# Patient Record
Sex: Female | Born: 1950
Health system: Southern US, Community
[De-identification: ages and names within clinical notes are randomized; demographics above are authoritative.]

## PROBLEM LIST (undated history)

## (undated) DIAGNOSIS — S301XXA Contusion of abdominal wall, initial encounter: Secondary | ICD-10-CM

## (undated) DIAGNOSIS — I509 Heart failure, unspecified: Secondary | ICD-10-CM

## (undated) DIAGNOSIS — I4901 Ventricular fibrillation: Secondary | ICD-10-CM

## (undated) DIAGNOSIS — I480 Paroxysmal atrial fibrillation: Secondary | ICD-10-CM

## (undated) DIAGNOSIS — J189 Pneumonia, unspecified organism: Secondary | ICD-10-CM

## (undated) DIAGNOSIS — I313 Pericardial effusion (noninflammatory): Secondary | ICD-10-CM

## (undated) DIAGNOSIS — N201 Calculus of ureter: Secondary | ICD-10-CM

## (undated) DIAGNOSIS — N200 Calculus of kidney: Secondary | ICD-10-CM

## (undated) DIAGNOSIS — I214 Non-ST elevation (NSTEMI) myocardial infarction: Secondary | ICD-10-CM

## (undated) DIAGNOSIS — J9809 Other diseases of bronchus, not elsewhere classified: Secondary | ICD-10-CM

## (undated) DIAGNOSIS — T17500A Unspecified foreign body in bronchus causing asphyxiation, initial encounter: Secondary | ICD-10-CM

## (undated) DIAGNOSIS — I3139 Other pericardial effusion (noninflammatory): Secondary | ICD-10-CM

## (undated) DIAGNOSIS — I10 Essential (primary) hypertension: Secondary | ICD-10-CM

## (undated) DIAGNOSIS — I739 Peripheral vascular disease, unspecified: Secondary | ICD-10-CM

## (undated) DIAGNOSIS — E7800 Pure hypercholesterolemia, unspecified: Secondary | ICD-10-CM

## (undated) DIAGNOSIS — Z87442 Personal history of urinary calculi: Secondary | ICD-10-CM

## (undated) DIAGNOSIS — S3012XA Contusion of groin, initial encounter: Secondary | ICD-10-CM

## (undated) DIAGNOSIS — I714 Abdominal aortic aneurysm, without rupture, unspecified: Secondary | ICD-10-CM

## (undated) DIAGNOSIS — I4891 Unspecified atrial fibrillation: Secondary | ICD-10-CM

## (undated) DIAGNOSIS — I5189 Other ill-defined heart diseases: Secondary | ICD-10-CM

## (undated) DIAGNOSIS — I2510 Atherosclerotic heart disease of native coronary artery without angina pectoris: Secondary | ICD-10-CM

## (undated) DIAGNOSIS — E109 Type 1 diabetes mellitus without complications: Secondary | ICD-10-CM

## (undated) HISTORY — PX: CORONARY ANGIOPLASTY WITH STENT PLACEMENT: SHX49

## (undated) HISTORY — DX: Other pericardial effusion (noninflammatory): I31.39

## (undated) HISTORY — DX: Paroxysmal atrial fibrillation: I48.0

## (undated) HISTORY — DX: Calculus of kidney: N20.0

## (undated) HISTORY — DX: Other diseases of bronchus, not elsewhere classified: J98.09

## (undated) HISTORY — DX: Calculus of ureter: N20.1

## (undated) HISTORY — PX: RECONSTRUCTION OF EYELID: SHX6576

## (undated) HISTORY — DX: Pericardial effusion (noninflammatory): I31.3

## (undated) HISTORY — PX: COLONOSCOPY: SHX174

## (undated) HISTORY — PX: TONSILLECTOMY: SUR1361

## (undated) HISTORY — DX: Unspecified atrial fibrillation: I48.91

## (undated) HISTORY — DX: Unspecified foreign body in bronchus causing asphyxiation, initial encounter: T17.500A

## (undated) HISTORY — PX: NASAL SINUS SURGERY: SHX719

---

## 2004-12-20 ENCOUNTER — Ambulatory Visit: Payer: Self-pay | Admitting: Unknown Physician Specialty

## 2006-12-18 ENCOUNTER — Ambulatory Visit: Payer: Self-pay | Admitting: General Surgery

## 2009-11-03 ENCOUNTER — Ambulatory Visit: Payer: Self-pay | Admitting: Endocrinology

## 2011-01-03 ENCOUNTER — Ambulatory Visit: Payer: Self-pay | Admitting: Obstetrics and Gynecology

## 2011-01-17 HISTORY — PX: COLPOSCOPY: SHX161

## 2012-03-05 HISTORY — PX: CERVICAL BIOPSY  W/ LOOP ELECTRODE EXCISION: SUR135

## 2013-02-01 ENCOUNTER — Ambulatory Visit: Payer: Self-pay | Admitting: Gynecologic Oncology

## 2013-02-26 ENCOUNTER — Ambulatory Visit: Payer: Self-pay | Admitting: Gynecologic Oncology

## 2013-03-03 ENCOUNTER — Ambulatory Visit: Payer: Self-pay | Admitting: Gynecologic Oncology

## 2014-12-03 ENCOUNTER — Ambulatory Visit: Payer: Self-pay | Admitting: Family

## 2014-12-03 ENCOUNTER — Encounter: Payer: Self-pay | Admitting: Physician Assistant

## 2014-12-03 VITALS — BP 130/80 | HR 71 | Temp 98.5°F

## 2014-12-03 DIAGNOSIS — J019 Acute sinusitis, unspecified: Secondary | ICD-10-CM

## 2014-12-03 MED ORDER — AMOXICILLIN 875 MG PO TABS: 875.0000 mg | ORAL_TABLET | Freq: Two times a day (BID) | ORAL | Status: DC

## 2014-12-03 NOTE — Progress Notes (Signed)
S/ nasal congestion , sinus pressure and pain, low grade temp, malaise,  getting worse  O/VSS mildly ill appearing NAD ENT R EAC with wax, left tm dull, nasal mucosa very inflamed , with purulent d/c turbinates boggy , + frontomax tenderness phaynx clear Neck supple heart rsr lungs clear  A/ rhinosinusitis P/ amoxicillan 875 bid , supportive measures .

## 2015-05-29 ENCOUNTER — Ambulatory Visit: Payer: Self-pay | Admitting: Physician Assistant

## 2015-05-29 ENCOUNTER — Encounter: Payer: Self-pay | Admitting: Physician Assistant

## 2015-05-29 VITALS — BP 125/90 | HR 92 | Temp 98.5°F

## 2015-05-29 DIAGNOSIS — J209 Acute bronchitis, unspecified: Secondary | ICD-10-CM

## 2015-05-29 MED ORDER — PSEUDOEPH-BROMPHEN-DM 30-2-10 MG/5ML PO SYRP: 5.0000 mL | ORAL_SOLUTION | Freq: Four times a day (QID) | ORAL | Status: DC | PRN

## 2015-05-29 MED ORDER — SULFAMETHOXAZOLE-TRIMETHOPRIM 800-160 MG PO TABS: 1.0000 | ORAL_TABLET | Freq: Two times a day (BID) | ORAL | Status: DC

## 2015-05-29 NOTE — Progress Notes (Signed)
   Subjective:Cough    Patient ID: Lisa [name], female    DOB: 03/21/1950, 65 y.o.   MRN: [id number]  HPI Patient states one week of productive greenish cough. Denies fever/chill, or N/V/D. No palliative measure for compliant.   Review of Systems    Negative except for compliant. Objective:   Physical Exam No acute distress.  VSS. HEENT unremarkable. Neck supple, Lungs with upper bialteral Rales. Heart RRR.       Assessment & Plan:Bronchitis.  Bactrim DS and Bromfed DM.  Follow up 3 days if no improvement.

## 2016-02-01 DIAGNOSIS — J189 Pneumonia, unspecified organism: Secondary | ICD-10-CM

## 2016-02-01 HISTORY — DX: Pneumonia, unspecified organism: J18.9

## 2016-10-01 HISTORY — PX: CARDIAC CATHETERIZATION: SHX172

## 2016-10-11 ENCOUNTER — Encounter
Admission: EM | Disposition: A | Discharge status: Other Acute Inpatient Hospital | Payer: Self-pay | Source: Home / Self Care | Attending: Internal Medicine

## 2016-10-11 ENCOUNTER — Inpatient Hospital Stay: Payer: Managed Care, Other (non HMO)

## 2016-10-11 ENCOUNTER — Inpatient Hospital Stay (HOSPITAL_COMMUNITY): Payer: Managed Care, Other (non HMO)

## 2016-10-11 ENCOUNTER — Inpatient Hospital Stay
Admission: EM | Admit: 2016-10-11 | Discharge: 2016-10-13 | DRG: 689 | Disposition: A | Discharge status: Other Acute Inpatient Hospital | Payer: Managed Care, Other (non HMO) | Attending: Internal Medicine | Admitting: Internal Medicine

## 2016-10-11 ENCOUNTER — Emergency Department: Payer: Managed Care, Other (non HMO)

## 2016-10-11 DIAGNOSIS — Z7982 Long term (current) use of aspirin: Secondary | ICD-10-CM

## 2016-10-11 DIAGNOSIS — R1032 Left lower quadrant pain: Secondary | ICD-10-CM

## 2016-10-11 DIAGNOSIS — J9600 Acute respiratory failure, unspecified whether with hypoxia or hypercapnia: Secondary | ICD-10-CM

## 2016-10-11 DIAGNOSIS — T782XXS Anaphylactic shock, unspecified, sequela: Secondary | ICD-10-CM | POA: Diagnosis not present

## 2016-10-11 DIAGNOSIS — E1051 Type 1 diabetes mellitus with diabetic peripheral angiopathy without gangrene: Secondary | ICD-10-CM | POA: Diagnosis present

## 2016-10-11 DIAGNOSIS — K7200 Acute and subacute hepatic failure without coma: Secondary | ICD-10-CM | POA: Diagnosis not present

## 2016-10-11 DIAGNOSIS — Z792 Long term (current) use of antibiotics: Secondary | ICD-10-CM | POA: Diagnosis not present

## 2016-10-11 DIAGNOSIS — Z87891 Personal history of nicotine dependence: Secondary | ICD-10-CM

## 2016-10-11 DIAGNOSIS — E108 Type 1 diabetes mellitus with unspecified complications: Secondary | ICD-10-CM | POA: Diagnosis not present

## 2016-10-11 DIAGNOSIS — I469 Cardiac arrest, cause unspecified: Secondary | ICD-10-CM

## 2016-10-11 DIAGNOSIS — I4901 Ventricular fibrillation: Secondary | ICD-10-CM

## 2016-10-11 DIAGNOSIS — G931 Anoxic brain damage, not elsewhere classified: Secondary | ICD-10-CM | POA: Diagnosis not present

## 2016-10-11 DIAGNOSIS — I442 Atrioventricular block, complete: Secondary | ICD-10-CM | POA: Diagnosis not present

## 2016-10-11 DIAGNOSIS — E871 Hypo-osmolality and hyponatremia: Secondary | ICD-10-CM | POA: Diagnosis not present

## 2016-10-11 DIAGNOSIS — Z87442 Personal history of urinary calculi: Secondary | ICD-10-CM

## 2016-10-11 DIAGNOSIS — J9690 Respiratory failure, unspecified, unspecified whether with hypoxia or hypercapnia: Secondary | ICD-10-CM

## 2016-10-11 DIAGNOSIS — N201 Calculus of ureter: Secondary | ICD-10-CM | POA: Diagnosis not present

## 2016-10-11 DIAGNOSIS — R110 Nausea: Secondary | ICD-10-CM

## 2016-10-11 DIAGNOSIS — I77811 Abdominal aortic ectasia: Secondary | ICD-10-CM | POA: Diagnosis present

## 2016-10-11 DIAGNOSIS — T886XXA Anaphylactic reaction due to adverse effect of correct drug or medicament properly administered, initial encounter: Secondary | ICD-10-CM | POA: Diagnosis not present

## 2016-10-11 DIAGNOSIS — N179 Acute kidney failure, unspecified: Secondary | ICD-10-CM | POA: Diagnosis not present

## 2016-10-11 DIAGNOSIS — I248 Other forms of acute ischemic heart disease: Secondary | ICD-10-CM

## 2016-10-11 DIAGNOSIS — E8770 Fluid overload, unspecified: Secondary | ICD-10-CM | POA: Diagnosis not present

## 2016-10-11 DIAGNOSIS — I214 Non-ST elevation (NSTEMI) myocardial infarction: Secondary | ICD-10-CM | POA: Diagnosis not present

## 2016-10-11 DIAGNOSIS — T361X5A Adverse effect of cephalosporins and other beta-lactam antibiotics, initial encounter: Secondary | ICD-10-CM | POA: Diagnosis present

## 2016-10-11 DIAGNOSIS — J9601 Acute respiratory failure with hypoxia: Secondary | ICD-10-CM | POA: Diagnosis not present

## 2016-10-11 DIAGNOSIS — T782XXA Anaphylactic shock, unspecified, initial encounter: Secondary | ICD-10-CM

## 2016-10-11 DIAGNOSIS — N132 Hydronephrosis with renal and ureteral calculous obstruction: Secondary | ICD-10-CM | POA: Diagnosis not present

## 2016-10-11 DIAGNOSIS — Z79899 Other long term (current) drug therapy: Secondary | ICD-10-CM | POA: Diagnosis not present

## 2016-10-11 DIAGNOSIS — D72829 Elevated white blood cell count, unspecified: Secondary | ICD-10-CM | POA: Diagnosis present

## 2016-10-11 DIAGNOSIS — D649 Anemia, unspecified: Secondary | ICD-10-CM | POA: Diagnosis not present

## 2016-10-11 DIAGNOSIS — N3001 Acute cystitis with hematuria: Secondary | ICD-10-CM

## 2016-10-11 DIAGNOSIS — I2510 Atherosclerotic heart disease of native coronary artery without angina pectoris: Secondary | ICD-10-CM | POA: Diagnosis present

## 2016-10-11 DIAGNOSIS — I739 Peripheral vascular disease, unspecified: Secondary | ICD-10-CM | POA: Diagnosis not present

## 2016-10-11 DIAGNOSIS — N200 Calculus of kidney: Secondary | ICD-10-CM

## 2016-10-11 DIAGNOSIS — Z9911 Dependence on respirator [ventilator] status: Secondary | ICD-10-CM | POA: Diagnosis not present

## 2016-10-11 DIAGNOSIS — R570 Cardiogenic shock: Secondary | ICD-10-CM | POA: Diagnosis not present

## 2016-10-11 DIAGNOSIS — I472 Ventricular tachycardia: Secondary | ICD-10-CM | POA: Diagnosis not present

## 2016-10-11 DIAGNOSIS — N23 Unspecified renal colic: Secondary | ICD-10-CM

## 2016-10-11 DIAGNOSIS — N136 Pyonephrosis: Principal | ICD-10-CM | POA: Diagnosis present

## 2016-10-11 DIAGNOSIS — Z87892 Personal history of anaphylaxis: Secondary | ICD-10-CM | POA: Diagnosis not present

## 2016-10-11 DIAGNOSIS — Z4659 Encounter for fitting and adjustment of other gastrointestinal appliance and device: Secondary | ICD-10-CM

## 2016-10-11 HISTORY — DX: Ventricular fibrillation: I49.01

## 2016-10-11 HISTORY — PX: ABDOMINAL AORTOGRAM: CATH118222

## 2016-10-11 HISTORY — DX: Atherosclerotic heart disease of native coronary artery without angina pectoris: I25.10

## 2016-10-11 HISTORY — DX: Type 1 diabetes mellitus without complications: E10.9

## 2016-10-11 HISTORY — PX: LEFT HEART CATH AND CORONARY ANGIOGRAPHY: CATH118249

## 2016-10-11 HISTORY — DX: Peripheral vascular disease, unspecified: I73.9

## 2016-10-11 HISTORY — DX: Non-ST elevation (NSTEMI) myocardial infarction: I21.4

## 2016-10-11 LAB — BLOOD GAS, ARTERIAL
Acid-base deficit: 12.1 mmol/L — ABNORMAL HIGH (ref 0.0–2.0)
BICARBONATE: 14.7 mmol/L — AB (ref 20.0–28.0)
FIO2: 0.4
MECHVT: 500 mL
O2 SAT: 98.3 %
PATIENT TEMPERATURE: 36
PCO2 ART: 36 mmHg (ref 32.0–48.0)
PEEP/CPAP: 5 cmH2O
PH ART: 7.23 — AB (ref 7.350–7.450)
PO2 ART: 123 mmHg — AB (ref 83.0–108.0)
RATE: 14 resp/min

## 2016-10-11 LAB — CBC WITH DIFFERENTIAL/PLATELET
Basophils Absolute: 0.1 10*3/uL (ref 0–0.1)
Basophils Relative: 1 %
EOS ABS: 0.4 10*3/uL (ref 0–0.7)
Eosinophils Relative: 3 %
HEMATOCRIT: 38 % (ref 35.0–47.0)
HEMOGLOBIN: 12.8 g/dL (ref 12.0–16.0)
LYMPHS ABS: 1.4 10*3/uL (ref 1.0–3.6)
LYMPHS PCT: 10 %
MCH: 27.7 pg (ref 26.0–34.0)
MCHC: 33.6 g/dL (ref 32.0–36.0)
MCV: 82.3 fL (ref 80.0–100.0)
MONOS PCT: 1 %
Monocytes Absolute: 0.1 10*3/uL — ABNORMAL LOW (ref 0.2–0.9)
NEUTROS PCT: 85 %
Neutro Abs: 11.5 10*3/uL — ABNORMAL HIGH (ref 1.4–6.5)
Platelets: 353 10*3/uL (ref 150–440)
RBC: 4.62 MIL/uL (ref 3.80–5.20)
RDW: 13.5 % (ref 11.5–14.5)
WBC: 13.5 10*3/uL — ABNORMAL HIGH (ref 3.6–11.0)

## 2016-10-11 LAB — URINALYSIS, COMPLETE (UACMP) WITH MICROSCOPIC
BILIRUBIN URINE: NEGATIVE
GLUCOSE, UA: NEGATIVE mg/dL
KETONES UR: NEGATIVE mg/dL
NITRITE: POSITIVE — AB
PH: 7 (ref 5.0–8.0)
PROTEIN: 30 mg/dL — AB
Specific Gravity, Urine: 1.011 (ref 1.005–1.030)

## 2016-10-11 LAB — TRIGLYCERIDES: Triglycerides: 66 mg/dL (ref <150)

## 2016-10-11 LAB — BASIC METABOLIC PANEL
ANION GAP: 16 — AB (ref 5–15)
BUN: 14 mg/dL (ref 6–20)
CALCIUM: 7.8 mg/dL — AB (ref 8.9–10.3)
CHLORIDE: 105 mmol/L (ref 101–111)
CO2: 16 mmol/L — AB (ref 22–32)
Creatinine, Ser: 0.99 mg/dL (ref 0.44–1.00)
GFR calc Af Amer: >60 mL/min (ref >60)
GFR calc non Af Amer: 59 mL/min — ABNORMAL LOW (ref >60)
GLUCOSE: 291 mg/dL — AB (ref 65–99)
Potassium: 3.2 mmol/L — ABNORMAL LOW (ref 3.5–5.1)
Sodium: 137 mmol/L (ref 135–145)

## 2016-10-11 LAB — COMPREHENSIVE METABOLIC PANEL
ALK PHOS: 80 U/L (ref 38–126)
ALT: 21 U/L (ref 14–54)
ANION GAP: 9 (ref 5–15)
AST: 22 U/L (ref 15–41)
Albumin: 3.8 g/dL (ref 3.5–5.0)
BILIRUBIN TOTAL: 0.6 mg/dL (ref 0.3–1.2)
BUN: 13 mg/dL (ref 6–20)
CALCIUM: 9 mg/dL (ref 8.9–10.3)
CO2: 27 mmol/L (ref 22–32)
CREATININE: 0.88 mg/dL (ref 0.44–1.00)
Chloride: 98 mmol/L — ABNORMAL LOW (ref 101–111)
Glucose, Bld: 222 mg/dL — ABNORMAL HIGH (ref 65–99)
Potassium: 4.6 mmol/L (ref 3.5–5.1)
SODIUM: 134 mmol/L — AB (ref 135–145)
TOTAL PROTEIN: 7.5 g/dL (ref 6.5–8.1)

## 2016-10-11 LAB — GLUCOSE, CAPILLARY
GLUCOSE-CAPILLARY: 103 mg/dL — AB (ref 65–99)
GLUCOSE-CAPILLARY: 106 mg/dL — AB (ref 65–99)
GLUCOSE-CAPILLARY: 132 mg/dL — AB (ref 65–99)
GLUCOSE-CAPILLARY: 162 mg/dL — AB (ref 65–99)
GLUCOSE-CAPILLARY: 266 mg/dL — AB (ref 65–99)
GLUCOSE-CAPILLARY: 309 mg/dL — AB (ref 65–99)
GLUCOSE-CAPILLARY: 331 mg/dL — AB (ref 65–99)
GLUCOSE-CAPILLARY: 367 mg/dL — AB (ref 65–99)
Glucose-Capillary: 314 mg/dL — ABNORMAL HIGH (ref 65–99)
Glucose-Capillary: 289 mg/dL — ABNORMAL HIGH (ref 65–99)
Glucose-Capillary: 261 mg/dL — ABNORMAL HIGH (ref 65–99)
Glucose-Capillary: 224 mg/dL — ABNORMAL HIGH (ref 65–99)
Glucose-Capillary: 219 mg/dL — ABNORMAL HIGH (ref 65–99)
Glucose-Capillary: 227 mg/dL — ABNORMAL HIGH (ref 65–99)

## 2016-10-11 LAB — MAGNESIUM: Magnesium: 1.8 mg/dL (ref 1.7–2.4)

## 2016-10-11 LAB — TROPONIN I
TROPONIN I: 7.77 ng/mL — AB (ref <0.03)
Troponin I: 7.82 ng/mL (ref <0.03)

## 2016-10-11 LAB — LIPASE, BLOOD: LIPASE: 18 U/L (ref 11–51)

## 2016-10-11 LAB — PROTIME-INR
INR: 1.72
Prothrombin Time: 20 seconds — ABNORMAL HIGH (ref 11.4–15.2)

## 2016-10-11 LAB — APTT: aPTT: 55 seconds — ABNORMAL HIGH (ref 24–36)

## 2016-10-11 LAB — MRSA PCR SCREENING: MRSA by PCR: NEGATIVE

## 2016-10-11 LAB — PHOSPHORUS: PHOSPHORUS: 3.3 mg/dL (ref 2.5–4.6)

## 2016-10-11 SURGERY — LEFT HEART CATH AND CORONARY ANGIOGRAPHY
Anesthesia: Moderate Sedation

## 2016-10-11 MED ORDER — EPINEPHRINE PF 1 MG/10ML IJ SOSY
PREFILLED_SYRINGE | INTRAMUSCULAR | Status: AC
  Filled 2016-10-11: qty 30

## 2016-10-11 MED ORDER — LACTATED RINGERS IV SOLN: INTRAVENOUS | Status: DC

## 2016-10-11 MED ORDER — HEPARIN SODIUM (PORCINE) 1000 UNIT/ML IJ SOLN
INTRAMUSCULAR | Status: AC
  Filled 2016-10-11: qty 1

## 2016-10-11 MED ORDER — DIPHENHYDRAMINE HCL 50 MG/ML IJ SOLN
INTRAMUSCULAR | Status: AC | PRN
  Administered 2016-10-11: 50 mg via INTRAVENOUS

## 2016-10-11 MED ORDER — MAGNESIUM SULFATE 50 % IJ SOLN
INTRAMUSCULAR | Status: AC | PRN
  Administered 2016-10-11: 2 g via INTRAVENOUS

## 2016-10-11 MED ORDER — SODIUM CHLORIDE 0.9% FLUSH: 3.0000 mL | INTRAVENOUS | Status: DC | PRN

## 2016-10-11 MED ORDER — ATROPINE SULFATE 1 MG/ML IJ SOLN
INTRAMUSCULAR | Status: AC | PRN
  Administered 2016-10-11 (×2): 1 mg via INTRAVENOUS

## 2016-10-11 MED ORDER — ATORVASTATIN CALCIUM 20 MG PO TABS
80.0000 mg | ORAL_TABLET | Freq: Every day | ORAL | Status: DC
  Administered 2016-10-11 – 2016-10-12 (×2): 80 mg via ORAL
  Filled 2016-10-11 (×2): qty 4

## 2016-10-11 MED ORDER — HEPARIN (PORCINE) IN NACL 2-0.9 UNIT/ML-% IJ SOLN
INTRAMUSCULAR | Status: AC
  Filled 2016-10-11: qty 500

## 2016-10-11 MED ORDER — DEXTROSE IN LACTATED RINGERS 5 % IV SOLN
INTRAVENOUS | Status: DC
  Administered 2016-10-11 – 2016-10-12 (×2): via INTRAVENOUS

## 2016-10-11 MED ORDER — FENTANYL 2500MCG IN NS 250ML (10MCG/ML) PREMIX INFUSION
25.0000 ug/h | INTRAVENOUS | Status: DC
  Administered 2016-10-11: 25 ug/h via INTRAVENOUS
  Filled 2016-10-11: qty 250

## 2016-10-11 MED ORDER — FENTANYL CITRATE (PF) 100 MCG/2ML IJ SOLN
INTRAMUSCULAR | Status: AC
  Filled 2016-10-11: qty 2

## 2016-10-11 MED ORDER — AMIODARONE HCL 150 MG/3ML IV SOLN
INTRAVENOUS | Status: AC | PRN
  Administered 2016-10-11: 300 mg via INTRAVENOUS

## 2016-10-11 MED ORDER — SODIUM CHLORIDE 0.9 % IV SOLN: INTRAVENOUS | Status: DC

## 2016-10-11 MED ORDER — SODIUM CHLORIDE 0.9 % IV SOLN
1000.0000 mL | Freq: Once | INTRAVENOUS | Status: AC
Start: 2016-10-11 — End: 2016-10-11
  Administered 2016-10-11: 1000 mL via INTRAVENOUS

## 2016-10-11 MED ORDER — SODIUM CHLORIDE 0.9 % IV SOLN: 250.0000 mL | INTRAVENOUS | Status: DC | PRN

## 2016-10-11 MED ORDER — NITROGLYCERIN 5 MG/ML IV SOLN
INTRAVENOUS | Status: AC
  Filled 2016-10-11: qty 10

## 2016-10-11 MED ORDER — SUCCINYLCHOLINE CHLORIDE 20 MG/ML IJ SOLN
INTRAMUSCULAR | Status: AC | PRN
  Administered 2016-10-11: 100 mg via INTRAVENOUS

## 2016-10-11 MED ORDER — FAMOTIDINE IN NACL 20-0.9 MG/50ML-% IV SOLN
20.0000 mg | Freq: Two times a day (BID) | INTRAVENOUS | Status: DC
  Administered 2016-10-11 (×2): 20 mg via INTRAVENOUS
  Filled 2016-10-11 (×5): qty 50

## 2016-10-11 MED ORDER — SODIUM CHLORIDE 0.9 % IV SOLN
INTRAVENOUS | Status: DC
  Administered 2016-10-11: 14:00:00 via INTRAVENOUS

## 2016-10-11 MED ORDER — INSULIN ASPART 100 UNIT/ML ~~LOC~~ SOLN: 0.0000 [IU] | SUBCUTANEOUS | Status: DC

## 2016-10-11 MED ORDER — ORAL CARE MOUTH RINSE
15.0000 mL | OROMUCOSAL | Status: DC
  Administered 2016-10-11 – 2016-10-12 (×6): 15 mL via OROMUCOSAL

## 2016-10-11 MED ORDER — ETOMIDATE 2 MG/ML IV SOLN
INTRAVENOUS | Status: AC | PRN
  Administered 2016-10-11: 20 mg via INTRAVENOUS

## 2016-10-11 MED ORDER — SODIUM CHLORIDE 0.9 % IV SOLN
INTRAVENOUS | Status: AC | PRN
  Administered 2016-10-11: 250 mL via INTRAVENOUS

## 2016-10-11 MED ORDER — FENTANYL CITRATE (PF) 100 MCG/2ML IJ SOLN
100.0000 ug | INTRAMUSCULAR | Status: DC | PRN
  Administered 2016-10-11 (×2): 100 ug via INTRAVENOUS
  Filled 2016-10-11 (×2): qty 2

## 2016-10-11 MED ORDER — FENTANYL CITRATE (PF) 100 MCG/2ML IJ SOLN
50.0000 ug | INTRAMUSCULAR | Status: DC | PRN
  Administered 2016-10-12 (×2): 100 ug via INTRAVENOUS

## 2016-10-11 MED ORDER — FENTANYL CITRATE (PF) 100 MCG/2ML IJ SOLN: 50.0000 ug | Freq: Once | INTRAMUSCULAR | Status: AC

## 2016-10-11 MED ORDER — ONDANSETRON HCL 4 MG/2ML IJ SOLN
4.0000 mg | Freq: Once | INTRAMUSCULAR | Status: AC
  Administered 2016-10-11: 4 mg via INTRAVENOUS
  Filled 2016-10-11: qty 2

## 2016-10-11 MED ORDER — HEPARIN (PORCINE) IN NACL 100-0.45 UNIT/ML-% IJ SOLN
950.0000 [IU]/h | INTRAMUSCULAR | Status: DC
  Administered 2016-10-11 – 2016-10-13 (×2): 800 [IU]/h via INTRAVENOUS
  Filled 2016-10-11 (×2): qty 250

## 2016-10-11 MED ORDER — CIPROFLOXACIN IN D5W 400 MG/200ML IV SOLN
400.0000 mg | Freq: Two times a day (BID) | INTRAVENOUS | Status: DC
  Administered 2016-10-11 – 2016-10-12 (×4): 400 mg via INTRAVENOUS
  Filled 2016-10-11 (×6): qty 200

## 2016-10-11 MED ORDER — MIDAZOLAM HCL 2 MG/2ML IJ SOLN
2.0000 mg | INTRAMUSCULAR | Status: DC | PRN
  Administered 2016-10-12: 2 mg via INTRAVENOUS
  Filled 2016-10-11: qty 2

## 2016-10-11 MED ORDER — HEPARIN SODIUM (PORCINE) 5000 UNIT/ML IJ SOLN
5000.0000 [IU] | Freq: Three times a day (TID) | INTRAMUSCULAR | Status: DC
  Administered 2016-10-11: 5000 [IU] via SUBCUTANEOUS
  Filled 2016-10-11: qty 1

## 2016-10-11 MED ORDER — EPINEPHRINE PF 1 MG/10ML IJ SOSY
PREFILLED_SYRINGE | INTRAMUSCULAR | Status: AC | PRN
  Administered 2016-10-11 (×8): 1 mg via INTRAVENOUS

## 2016-10-11 MED ORDER — SODIUM CHLORIDE 0.9% FLUSH
3.0000 mL | Freq: Two times a day (BID) | INTRAVENOUS | Status: DC
  Administered 2016-10-11 – 2016-10-13 (×4): 3 mL via INTRAVENOUS

## 2016-10-11 MED ORDER — VERAPAMIL HCL 2.5 MG/ML IV SOLN
INTRAVENOUS | Status: AC
  Filled 2016-10-11: qty 2

## 2016-10-11 MED ORDER — MIDAZOLAM HCL 2 MG/2ML IJ SOLN
2.0000 mg | INTRAMUSCULAR | Status: DC | PRN
  Administered 2016-10-11 (×2): 2 mg via INTRAVENOUS
  Filled 2016-10-11 (×3): qty 2

## 2016-10-11 MED ORDER — ASPIRIN 300 MG RE SUPP
300.0000 mg | RECTAL | Status: DC
  Filled 2016-10-11: qty 1

## 2016-10-11 MED ORDER — METHYLPREDNISOLONE SODIUM SUCC 125 MG IJ SOLR
125.0000 mg | Freq: Once | INTRAMUSCULAR | Status: AC
  Administered 2016-10-11: 125 mg via INTRAVENOUS

## 2016-10-11 MED ORDER — MORPHINE SULFATE (PF) 4 MG/ML IV SOLN
4.0000 mg | Freq: Once | INTRAVENOUS | Status: DC
  Filled 2016-10-11: qty 1

## 2016-10-11 MED ORDER — DIPHENHYDRAMINE HCL 50 MG/ML IJ SOLN
INTRAMUSCULAR | Status: AC
  Filled 2016-10-11: qty 1

## 2016-10-11 MED ORDER — MORPHINE SULFATE (PF) 4 MG/ML IV SOLN
4.0000 mg | Freq: Once | INTRAVENOUS | Status: AC
  Administered 2016-10-11: 4 mg via INTRAVENOUS
  Filled 2016-10-11: qty 1

## 2016-10-11 MED ORDER — METHYLPREDNISOLONE SODIUM SUCC 125 MG IJ SOLR
INTRAMUSCULAR | Status: AC
  Administered 2016-10-11: 125 mg via INTRAVENOUS
  Filled 2016-10-11: qty 2

## 2016-10-11 MED ORDER — EPINEPHRINE PF 1 MG/ML IJ SOLN
0.5000 ug/min | INTRAVENOUS | Status: DC
  Filled 2016-10-11: qty 4

## 2016-10-11 MED ORDER — CHLORHEXIDINE GLUCONATE 0.12% ORAL RINSE (MEDLINE KIT)
15.0000 mL | Freq: Two times a day (BID) | OROMUCOSAL | Status: DC
  Administered 2016-10-11 – 2016-10-12 (×2): 15 mL via OROMUCOSAL

## 2016-10-11 MED ORDER — INSULIN GLARGINE 100 UNIT/ML ~~LOC~~ SOLN
10.0000 [IU] | Freq: Every day | SUBCUTANEOUS | Status: DC
  Filled 2016-10-11 (×2): qty 0.1

## 2016-10-11 MED ORDER — POTASSIUM CHLORIDE 10 MEQ/100ML IV SOLN
10.0000 meq | INTRAVENOUS | Status: AC
  Administered 2016-10-11 (×4): 10 meq via INTRAVENOUS
  Filled 2016-10-11 (×4): qty 100

## 2016-10-11 MED ORDER — MIDAZOLAM HCL 2 MG/2ML IJ SOLN
INTRAMUSCULAR | Status: AC
  Filled 2016-10-11: qty 2

## 2016-10-11 MED ORDER — DEXTROSE 5 % IV SOLN
2.0000 g | Freq: Once | INTRAVENOUS | Status: AC
  Administered 2016-10-11: 2 g via INTRAVENOUS
  Filled 2016-10-11: qty 2

## 2016-10-11 MED ORDER — ASPIRIN 81 MG PO CHEW
81.0000 mg | CHEWABLE_TABLET | Freq: Every day | ORAL | Status: DC
  Administered 2016-10-11 – 2016-10-13 (×2): 81 mg via ORAL
  Filled 2016-10-11 (×2): qty 1

## 2016-10-11 MED ORDER — NOREPINEPHRINE BITARTRATE 1 MG/ML IV SOLN
0.0000 ug/min | INTRAVENOUS | Status: DC
  Administered 2016-10-11: 5 ug/min via INTRAVENOUS
  Filled 2016-10-11 (×2): qty 16

## 2016-10-11 MED ORDER — LIDOCAINE HCL (PF) 1 % IJ SOLN
INTRAMUSCULAR | Status: AC
  Filled 2016-10-11: qty 30

## 2016-10-11 MED ORDER — SODIUM CHLORIDE 0.9 % IV SOLN
INTRAVENOUS | Status: DC
  Administered 2016-10-11: 2.3 [IU]/h via INTRAVENOUS
  Administered 2016-10-12: 11.1 [IU]/h via INTRAVENOUS
  Filled 2016-10-11 (×2): qty 1

## 2016-10-11 MED ORDER — FENTANYL BOLUS VIA INFUSION
50.0000 ug | INTRAVENOUS | Status: DC | PRN
  Filled 2016-10-11: qty 50

## 2016-10-11 MED ORDER — SODIUM CHLORIDE 0.9 % IV BOLUS (SEPSIS)
500.0000 mL | Freq: Once | INTRAVENOUS | Status: AC
  Administered 2016-10-12: 500 mL via INTRAVENOUS

## 2016-10-11 MED ORDER — PROPOFOL 1000 MG/100ML IV EMUL
0.0000 ug/kg/min | INTRAVENOUS | Status: DC
  Administered 2016-10-11: 10 ug/kg/min via INTRAVENOUS
  Administered 2016-10-12: 28 ug/kg/min via INTRAVENOUS
  Filled 2016-10-11 (×2): qty 100

## 2016-10-11 SURGICAL SUPPLY — 14 items
CABLE ADAPT CONN TEMP 6FT (ADAPTER) IMPLANT
CATH 5FR JR4 DIAGNOSTIC (CATHETERS) ×2 IMPLANT
CATH INFINITI 5FR ANG PIGTAIL (CATHETERS) ×2 IMPLANT
CATH INFINITI 5FR JL4 (CATHETERS) ×2 IMPLANT
DEVICE INFLAT 30 PLUS (MISCELLANEOUS) IMPLANT
GLIDESHEATH SLEND SS 6F .021 (SHEATH) IMPLANT
GUIDEWIRE 3MM J TIP .035 145 (WIRE) ×2 IMPLANT
KIT MANI 3VAL PERCEP (MISCELLANEOUS) ×2 IMPLANT
NEEDLE PERC 18GX7CM (NEEDLE) ×2 IMPLANT
PACK CARDIAC CATH (CUSTOM PROCEDURE TRAY) ×2 IMPLANT
SHEATH AVANTI 6FR X 11CM (SHEATH) ×4 IMPLANT
SLEEVE REPOSITIONING LENGTH 30 (MISCELLANEOUS) IMPLANT
WIRE HITORQ VERSACORE ST 145CM (WIRE) ×2 IMPLANT
WIRE PACING TEMP ST TIP 5 (CATHETERS) IMPLANT

## 2016-10-11 NOTE — Progress Notes (Signed)
Per [employer] sun and temp foley probe, patient temp running below 36C. Placed rectal temp probe to confirm temperature. Also in [place] sun placed patient in staegty one, for awake patients not on neuromuscular blockade. Placed warm blankets on patient as well. Patient temp slowly increasing. Will continue to monitor and assess patient and temp.

## 2016-10-11 NOTE — Progress Notes (Signed)
ANTICOAGULATION CONSULT NOTE - Initial Consult  Pharmacy Consult for Heparin Drip  Indication: chest pain/ACS  Allergies  Allergen Reactions  . Ceftriaxone     Anaphylaxis, cardiac arrest    Patient Measurements: Height: 5\' 3"  (160 cm) Weight: 149 lb 0.5 oz (67.6 kg) IBW/kg (Calculated) : 52.4 Vital Signs: Temp: 96.6 F (35.9 C) (09/11 1630) Temp Source: Rectal (09/11 1630) BP: 100/66 (09/11 1435) Pulse Rate: 99 (09/11 1435)  Labs:  Recent Labs  10/11/16 0711 10/11/16 1237  HGB 12.8  --   HCT 38.0  --   PLT 353  --   APTT  --  55*  LABPROT  --  20.0*  INR  --  1.72  CREATININE 0.88 0.99  TROPONINI  --  7.82*    Estimated Creatinine Clearance: 53 mL/min (by C-G formula based on SCr of 0.99 mg/dL).   Medical History: Past Medical History:  Diagnosis Date  . Diabetes mellitus without complication [facility])    Assessment: Pharmacy consulted for heparin drip dosing and monitoring in 66 yo female for ACS. Pharmacy instructed to begin  drip 4 hours after left femoral artery sheath removed and hemostasis achieved. According to procedure log, sheath removal was was around 1200 today.   Goal of Therapy:  Heparin level 0.3-0.7 units/ml Monitor platelets by anticoagulation protocol: Yes   Plan:  Patient ordered heparin 5000u SQ when admitted to ICU. Patient received 1 dose at 1617 on 9/11. Order has now been discontinued.  Will not order bolus since patient received 1 dose heparin 5000u SQ.  Start heparin infusion at 800 units/hr Check anti-Xa level in 6 hours and daily while on heparin Continue to monitor H&H and platelets  Pernell Dupre, PharmD, BCPS Clinical Pharmacist 10/11/2016 5:37 PM

## 2016-10-11 NOTE — ED Notes (Addendum)
Started rocephin and immediately pt reported that she could not breathe states "help me, help me, I can't breathe, and my mouth taste like metal".  Pt neck became red, pt left arm at site of IV became red.  Dr Jimmye Norman to bedside.   Pt able to speak to him, answered questions.  Then pt started to have seizure like activity.  Pt lost control of bowel and urine and then stopped breathing and lost pulse.  Started CPR, Dr Jimmye Norman started bagging pt.  Code started at [phone]

## 2016-10-11 NOTE — Consult Note (Addendum)
PULMONARY / CRITICAL CARE MEDICINE   Name: Lisa [name] MRN: [id number] DOB: 1950/09/10    ADMISSION DATE:  10/11/2016  PT PROFILE:   62 F with type I DM assented to ED 09/11 with left flank pain. CTAP revealed left ureteral stone and mild left hydronephrosis. Ceftriaxone was administered and very shortly thereafter she described a metallic taste in her mouth, then suffered cardiac arrest. She underwent prolonged CPR/ACLS (45 minutes). After resuscitation, she had findings on EKG worrisome for ischemia and therefore was taken for [facility]. Hypothermia protocol (36) was initiated.  MAJOR EVENTS/TEST RESULTS: 09/11 admission as above. Prolonged ACLS in ED. LHC performed. Hypothermia protocol initiated 09/11 CTAP: 4 mm distal left ureteral calculus with mild hydroureteronephrosis 09/11 LHC: Severe 2 vessel coronary artery disease, including heavily calcified 95% proximal/mid LCx and sequential 90-99% mid RCA stenoses  INDWELLING DEVICES:: ETT 09/11 >>  R femoral CVL 09/11 >>   MICRO DATA: MRSA PCR >>  Urine 09/11 >>  Resp  >>  Blood  >>   ANTIMICROBIALS:  Ciprofloxacin 09/11 >>     PAST MEDICAL HISTORY :  She  has a past medical history of Diabetes mellitus without complication ([place]).  PAST SURGICAL HISTORY: She  has a past surgical history that includes LEFT HEART CATH AND CORONARY ANGIOGRAPHY (N/A, 10/11/2016) and ABDOMINAL AORTOGRAM (N/A, 10/11/2016).  Allergies  Allergen Reactions  . Ceftriaxone     Anaphylaxis, cardiac arrest    No current facility-administered medications on file prior to encounter.    Current Outpatient Prescriptions on File Prior to Encounter  Medication Sig  . brompheniramine-pseudoephedrine-DM 30-2-10 MG/5ML syrup Take 5 mLs by mouth 4 (four) times daily as needed. (Patient not taking: Reported on 10/11/2016)  . sulfamethoxazole-trimethoprim (BACTRIM DS,SEPTRA DS) 800-160 MG tablet Take 1 tablet by mouth 2 (two) times daily. (Patient not taking:  Reported on 10/11/2016)    FAMILY HISTORY:  Her has no family status information on file.    SOCIAL HISTORY: She  reports that she has quit smoking. She has never used smokeless tobacco. She reports that she drinks alcohol. She reports that she does not use drugs.  REVIEW OF SYSTEMS:   Level V caveat  SUBJECTIVE:    VITAL SIGNS: BP 105/65 (BP Location: Left Arm)   Pulse 92   Temp (!) 96.1 F (35.6 C) (Core (Comment))   Resp (!) 22   Ht 5\' 3"  (1.6 m)   Wt 67.6 kg (149 lb 0.5 oz)   SpO2 100%   BMI 26.40 kg/m   HEMODYNAMICS:    VENTILATOR SETTINGS: Vent Mode: PRVC FiO2 (%):  [60 %-100 %] 60 % Set Rate:  [14 bmp-20 bmp] 14 bmp Vt Set:  [450 mL-500 mL] 500 mL PEEP:  [5 cmH20] 5 cmH20  INTAKE / OUTPUT: No intake/output data recorded.  PHYSICAL EXAMINATION: General: WDWN, intubated, unresponsive Neuro: No spontaneous movement, PERRLA HEENT: NCAT, sclerae white Cardiovascular: Regular, no M Lungs: No wheezes or other adventitious sounds Abdomen: Soft, NT, diminished BS Ext: cool, no edema Skin: No lesions noted  LABS:  BMET  Recent Labs Lab 10/11/16 0711 10/11/16 1237  NA 134* 137  K 4.6 3.2*  CL 98* 105  CO2 27 16*  BUN 13 14  CREATININE 0.88 0.99  GLUCOSE 222* 291*    Electrolytes  Recent Labs Lab 10/11/16 0711 10/11/16 1237  CALCIUM 9.0 7.8*    CBC  Recent Labs Lab 10/11/16 0711  WBC 13.5*  HGB 12.8  HCT 38.0  PLT 353  Coag's  Recent Labs Lab 10/11/16 1237  APTT 55*  INR 1.72    Sepsis Markers No results for input(s): LATICACIDVEN, PROCALCITON, O2SATVEN in the last 168 hours.  ABG No results for input(s): PHART, PCO2ART, PO2ART in the last 168 hours.  Liver Enzymes  Recent Labs Lab 10/11/16 0711  AST 22  ALT 21  ALKPHOS 80  BILITOT 0.6  ALBUMIN 3.8    Cardiac Enzymes  Recent Labs Lab 10/11/16 1237  TROPONINI 7.82*    Glucose  Recent Labs Lab 10/11/16 0956 10/11/16 1042 10/11/16 1240  GLUCAP  132* 367* 314*    CXR: No acute cardiac or pulmonary findings.   ASSESSMENT / PLAN:  CARDIOVASCULAR A:  Prolonged cardiac arrest - likely due to demand ischemia after anaphylaxis Anaphylaxis due to ceftriaxone Severe CAD P:  Cardiology following NE to maintain MAP >70 mmHg Continue ASA  PULMONARY A: VDRF after cardiac arrest P:   Cont full vent support - settings reviewed and/or adjusted Cont vent bundle Daily SBT if/when meets criteria  RENAL A:   Left kidney stone Mild left hydronephrosis P:   Urology consultation - no intervention planned Monitor BMET intermittently Monitor I/Os Correct electrolytes as indicated   GASTROINTESTINAL A:   No acute issues P:   SUP: IV famotidine NPO for now  HEMATOLOGIC A:   No acute issues P:  DVT px: SQ heparin Monitor CBC intermittently Transfuse per usual guidelines   INFECTIOUS A:   L hydronephrosis Pyuria P:   Monitor temp, WBC count Micro and abx as above   ENDOCRINE A:   Type I DM - insulin pump as outpatient P:   Lantus 10 units SSI, sensitive scale  NEUROLOGIC A:   Anoxic encephalopathy ICU/vent associated discomfort Hypothermia protocol (36) P:   RASS goal: -1, -2 PAD protocol - intermittent fentanyl, midazolam   FAMILY:      CCM time: 40 mins The above time includes time spent in consultation with patient and/or family members and reviewing care plan on multidisciplinary rounds  Merton Border, MD PCCM service Mobile [phone] Pager [phone]    10/11/2016, 2:24 PM

## 2016-10-11 NOTE — Progress Notes (Signed)
Inpatient Diabetes Program Recommendations  AACE/ADA: New Consensus Statement on Inpatient Glycemic Control (2015)  Target Ranges:  Prepandial:   less than 140 mg/dL      Peak postprandial:   less than 180 mg/dL (1-2 hours)      Critically ill patients:  140 - 180 mg/dL   Lab Results  Component Value Date   GLUCAP 314 (H) 10/11/2016    Review of Glycemic ControlResults for VALARY, MANAHAN (MRN [id number]) as of 10/11/2016 13:51  Ref. Range 10/11/2016 09:56 10/11/2016 10:42 10/11/2016 12:40  Glucose-Capillary Latest Ref Range: 65 - 99 mg/dL 132 (H) 367 (H) 314 (H)   Diabetes history: Type 1 diabetes Outpatient Diabetes medications: Humalog with insulin pump Current orders for Inpatient glycemic control:  IV insulin  Inpatient Diabetes Program Recommendations:    Note per documentation the insulin pump removed at 10:08 AM. Discussed with MD, pharmacist and RN.  MD has ordered insulin drip.  Recommend that patient stay on insulin drip while in ICU.  Thanks, Adah Perl, RN, BC-ADM Inpatient Diabetes Coordinator Pager [phone] (8a-5p)

## 2016-10-11 NOTE — ED Notes (Signed)
+   Femoral pulse noted at this time.

## 2016-10-11 NOTE — ED Notes (Signed)
Pt being transcutaneously paced at this time.  Paced at HR 80, 120 mAmp.  Pulse noted.

## 2016-10-11 NOTE — Progress Notes (Signed)
Patient's daughter called patient's PCP, Dr. Ronnald Collum to ask what medication patient received in the Spring of this year that caused her to "fall out in the floor unconscious and become disoriented x4 for a few minutes".  The medication that was believed to cause this before was Rocephin for pneumonia.  It is understood that patient did not state this as an allergy when being admitted in the ED.

## 2016-10-11 NOTE — ED Notes (Addendum)
+   femoral pulse noted per dr Jimmye Norman.  HR 49

## 2016-10-11 NOTE — H&P (Signed)
[place] [facility] [place] NAME: Lisa [name]    MR#:  [id number]  DATE OF BIRTH:  April 19, 1950  DATE OF ADMISSION:  10/11/2016  PRIMARY CARE PHYSICIAN: System, Pcp Not In   REQUESTING/REFERRING PHYSICIAN: [place]  CHIEF COMPLAINT:   Chief Complaint  Patient presents with  . Flank Pain    HISTORY OF PRESENT ILLNESS: Lisa [name]  is a 66 y.o. female with a known history of diabetes came to emergency room with flank pain, nausea, given injection of Rocephin for possible UTI in ER and immediately she had complain of funny smell , metallic taste and feeling uneasy. Followed by bradycardia and cardiac arrest. CPR was done 45 minutes, intubated and after return of circulation, cardiologist was called in due to abnormal-looking EKG and they decided to take her to catheterization immediately. Patient was seen post cardiac cheterization in Cath Lab, still intubated and not able to give any details.  PAST MEDICAL HISTORY:   Past Medical History:  Diagnosis Date  . Diabetes mellitus without complication ([place])     PAST SURGICAL HISTORY: Past Surgical History:  Procedure Laterality Date  . ABDOMINAL AORTOGRAM N/A 10/11/2016   Procedure: ABDOMINAL AORTOGRAM;  Surgeon: Nelva Bush, MD;  Location: [place] [facility];  Service: Cardiovascular;  Laterality: N/A;  . LEFT HEART CATH AND CORONARY ANGIOGRAPHY N/A 10/11/2016   Procedure: LEFT HEART CATH AND CORONARY ANGIOGRAPHY;  Surgeon: Nelva Bush, MD;  Location: [place] [facility];  Service: Cardiovascular;  Laterality: N/A;    SOCIAL HISTORY:  Social History  Substance Use Topics  . Smoking status: Former Research scientist (life sciences)  . Smokeless tobacco: Never Used  . Alcohol use 0.0 oz/week    FAMILY HISTORY: No family history on file.  DRUG ALLERGIES:  Allergies  Allergen Reactions  . Ceftriaxone     Anaphylaxis, cardiac arrest    REVIEW OF SYSTEMS:   Intubated on vent support so cannot give much  details.  MEDICATIONS AT HOME:  Prior to Admission medications   Medication Sig Start Date End Date Taking? Authorizing Provider  ALPRAZolam (XANAX) 0.25 MG tablet Take 0.25 mg by mouth daily.   Yes [provider]  aspirin EC 81 MG tablet Take 81 mg by mouth daily.   Yes [provider]  diltiazem (CARDIZEM CD) 120 MG 24 hr capsule Take 1 capsule by mouth daily. 09/29/16  Yes [provider]  ezetimibe-simvastatin (VYTORIN) 10-20 MG tablet Take 1 tablet by mouth daily.   Yes [provider]  HUMALOG 100 UNIT/ML injection TO BE USED WITH INSULIN PUMP 09/14/16  Yes [provider]  Vitamin D, Ergocalciferol, (DRISDOL) 50000 units CAPS capsule Take 50,000 Units by mouth every 30 (thirty) days.   Yes [provider]  brompheniramine-pseudoephedrine-DM 30-2-10 MG/5ML syrup Take 5 mLs by mouth 4 (four) times daily as needed. Patient not taking: Reported on 10/11/2016 05/29/15   Sable Feil, PA-C  sulfamethoxazole-trimethoprim (BACTRIM DS,SEPTRA DS) 800-160 MG tablet Take 1 tablet by mouth 2 (two) times daily. Patient not taking: Reported on 10/11/2016 05/29/15   Sable Feil, PA-C      PHYSICAL EXAMINATION:   VITAL SIGNS: Blood pressure (!) 91/59, pulse 93, temperature 99.7 F (37.6 C), temperature source Core (Comment), resp. rate 13, height 5\' 3"  (1.6 m), weight 67.6 kg (149 lb 0.5 oz), SpO2 99 %.  GENERAL:  66 y.o.-year-old patient lying in the bed with critical appearance.  EYES: Pupils equal, round,dialted, sluggish reactive to light. No scleral  icterus. Extraocular muscles intact.  HEENT: Head atraumatic, normocephalic. Oropharynx and nasopharynx clear.  NECK:  Supple, no jugular venous distention. No thyroid enlargement, no tenderness.  LUNGS: Normal breath sounds bilaterally, no wheezing, rales,rhonchi or crepitation. No use of accessory muscles of respiration. ET tube, on vent support. CARDIOVASCULAR: S1, S2 normal. No murmurs,  rubs, or gallops.  ABDOMEN: Soft, nontender, nondistended. Bowel sounds present. No organomegaly or mass.  EXTREMITIES: No pedal edema, cyanosis, or clubbing.  NEUROLOGIC: on vent support and cold ice. PSYCHIATRIC: The patient is on vnetilator.  SKIN: No obvious rash, lesion, or ulcer.   LABORATORY PANEL:   CBC  Recent Labs Lab 10/11/16 0711  WBC 13.5*  HGB 12.8  HCT 38.0  PLT 353  MCV 82.3  MCH 27.7  MCHC 33.6  RDW 13.5  LYMPHSABS 1.4  MONOABS 0.1*  EOSABS 0.4  BASOSABS 0.1   ------------------------------------------------------------------------------------------------------------------  Chemistries   Recent Labs Lab 10/11/16 0711 10/11/16 1237 10/11/16 1651  NA 134* 137  --   K 4.6 3.2*  --   CL 98* 105  --   CO2 27 16*  --   GLUCOSE 222* 291*  --   BUN 13 14  --   CREATININE 0.88 0.99  --   CALCIUM 9.0 7.8*  --   MG  --   --  1.8  AST 22  --   --   ALT 21  --   --   ALKPHOS 80  --   --   BILITOT 0.6  --   --    ------------------------------------------------------------------------------------------------------------------ estimated creatinine clearance is 53 mL/min (by C-G formula based on SCr of 0.99 mg/dL). ------------------------------------------------------------------------------------------------------------------ No results for input(s): TSH, T4TOTAL, T3FREE, THYROIDAB in the last 72 hours.  Invalid input(s): FREET3   Coagulation profile  Recent Labs Lab 10/11/16 1237  INR 1.72   ------------------------------------------------------------------------------------------------------------------- No results for input(s): DDIMER in the last 72 hours. -------------------------------------------------------------------------------------------------------------------  Cardiac Enzymes  Recent Labs Lab 10/11/16 1237 10/11/16 1651  TROPONINI 7.82* 7.77*    ------------------------------------------------------------------------------------------------------------------ Invalid input(s): POCBNP  ---------------------------------------------------------------------------------------------------------------  Urinalysis    Component Value Date/Time   COLORURINE YELLOW (A) 10/11/2016 0711   APPEARANCEUR CLOUDY (A) 10/11/2016 0711   LABSPEC 1.011 10/11/2016 0711   PHURINE 7.0 10/11/2016 0711   GLUCOSEU NEGATIVE 10/11/2016 0711   HGBUR MODERATE (A) 10/11/2016 0711   BILIRUBINUR NEGATIVE 10/11/2016 0711   KETONESUR NEGATIVE 10/11/2016 0711   PROTEINUR 30 (A) 10/11/2016 0711   NITRITE POSITIVE (A) 10/11/2016 0711   LEUKOCYTESUR LARGE (A) 10/11/2016 0711     RADIOLOGY: Dg Chest 1 View  Result Date: 10/11/2016 CLINICAL DATA:  ET and NG tube placement. EXAM: CHEST 1 VIEW COMPARISON:  Chest x-ray from same day. FINDINGS: Unchanged positioning of the endotracheal tube with the tip approximately 3.9 cm above the level of the carina. New enteric tube in place with the tip and distal side port in the gastric body. The cardiomediastinal silhouette is normal in size. No focal consolidation, pleural effusion, or pneumothorax. No acute osseous abnormality. IMPRESSION: 1. Interval placement of an enteric tube with the tip and distal side port in the gastric body. 2.  No active cardiopulmonary disease. Electronically Signed   By: Titus Dubin M.D.   On: 10/11/2016 13:32   Dg Chest 1 View  Result Date: 10/11/2016 CLINICAL DATA:  Post intubation, former smoker, diabetes mellitus EXAM: CHEST 1 VIEW COMPARISON:  Portable exam 1039 hours without priors for comparison FINDINGS: Tip of endotracheal tube projects 4.8 cm above  carina. External pacing leads project over chest. Normal heart size, mediastinal contours, and pulmonary vascularity. Lungs clear. No pleural effusion or pneumothorax. Bones demineralized. IMPRESSION: No acute abnormalities. Electronically  Signed   By: Lavonia Dana M.D.   On: 10/11/2016 11:52   Ct Renal Stone Study  Result Date: 10/11/2016 CLINICAL DATA:  Left flank pain.  History of renal stones. EXAM: CT ABDOMEN AND PELVIS WITHOUT CONTRAST TECHNIQUE: Multidetector CT imaging of the abdomen and pelvis was performed following the standard protocol without IV contrast. COMPARISON:  None. FINDINGS: Lower chest: Minimal atelectasis in the lung bases. No pleural effusion. Three-vessel coronary artery atherosclerosis. Normal heart size. No pericardial effusion. Hepatobiliary: No focal liver abnormality is seen. No gallstones, gallbladder wall thickening, or biliary dilatation. Pancreas: Mildly truncated appearance of the pancreatic tail. No ductal dilatation or surrounding inflammatory changes. Spleen: Unremarkable. Adrenals/Urinary Tract: Unremarkable adrenal glands. Punctate nonobstructing calculus in the lower pole of the right kidney. Three punctate nonobstructing left renal calculi. 4 mm obstructing calculus in the distal left ureter near the UVJ resulting in mild hydroureteronephrosis. Mild left perinephric stranding. Unremarkable bladder. Stomach/Bowel: Small sliding hiatal hernia. No evidence of bowel obstruction or inflammation. Unremarkable appendix. Vascular/Lymphatic: Extensive atherosclerosis of the abdominal aorta with mild infrarenal aortic ectasia measuring up to 2.8 cm diameter. Central displacement of intimal calcification more distally in the aorta over a length of 2.5 cm suggests a short segment dissection. No enlarged lymph nodes. Reproductive: 9 mm calcification in the uterine fundus likely reflecting a small fibroid. Unremarkable ovaries. Other: No intraperitoneal free fluid. Small fat containing umbilical hernia. Musculoskeletal: Mild L4 and L5 superior endplate compression fractures, chronic in appearance. Mild spondylosis and moderate posterior element hypertrophy in the lower lumbar spine with likely mild spinal stenosis at  L3-4 and L4-5. IMPRESSION: 1. 4 mm distal left ureteral calculus with mild hydroureteronephrosis. 2. Punctate nonobstructing bilateral renal calculi. 3. Small hiatal hernia. 4. Aortic Atherosclerosis (ICD10-I70.0). Mild infrarenal aortic ectasia and suspected short segment aortic dissection. Electronically Signed   By: Logan Bores M.D.   On: 10/11/2016 08:20    EKG: Orders placed or performed during the hospital encounter of 10/11/16  . EKG 12-Lead  . EKG 12-Lead  . EKG 12-Lead  . EKG 12-Lead  . EKG 12-Lead  . EKG 12-Lead    IMPRESSION AND PLAN:  * status post cardiac arrest and resuscitation   VF   Hypothermia protocol   Likely secondary to anaphylactic reaction to ceftriaxone.    Currently intubated on ventilatory support, further management per ICU team.  * elevated troponin   Cardiac catheterization was done and noted double vessel disease, no interventions.   Heparin IV drip and aspirin for now.   Echocardiogram.  * cardiogenic shock   On Levophed drip.  * UTI   Ureteral stone   Ciprofloxacillin IV for now.      All the records are reviewed and case discussed with ED provider. Management plans discussed with the patient, family and they are in agreement.  CODE STATUS: Full.    Code Status Orders        Start     Ordered   10/11/16 1227  Full code  Continuous     10/11/16 1226    Code Status History    Date Active Date Inactive Code Status Order ID Comments User Context   10/11/2016 11:15 AM 10/11/2016 12:26 PM Full Code [id number]  Wilhelmina Mcardle, MD Inpatient       TOTAL TIME TAKING CARE  OF THIS PATIENT: 50 critical care minutes.    Vaughan Basta M.D on 10/11/2016   Between 7am to 6pm - Pager - [phone]  After 6pm go to [email or web address] - password [email or web address] [place]  Office  [phone]  CC: Primary care physician; System, Pcp Not In   Note: This dictation was prepared with Dragon dictation along with smaller  phrase technology. Any transcriptional errors that result from this process are unintentional.

## 2016-10-11 NOTE — ED Notes (Signed)
1 amp atropine given IV per orders from dr Jimmye Norman.

## 2016-10-11 NOTE — ED Notes (Signed)
1 amp epi given iv per orders from dr Jimmye Norman.

## 2016-10-11 NOTE — ED Notes (Signed)
Pt insulin pump removed at this time.

## 2016-10-11 NOTE — Progress Notes (Signed)
Clear stoned, silver ring set from patient's left ring finger removed from patient's left hand. Given to Daughter, Caryl Pina to deliver home to appropriate family member (Husband per daughter).

## 2016-10-11 NOTE — H&P (Signed)
Cardiac Catheterization History and Physical  CC: Cardiac Arrest  HPI: 66 y/o woman with type 1 DM admitted with left flank pain found to have obstructing stone with concern for urinary tract infection. After beginning ceftriaxone infusion, she felt unwell and then had cardiac arrest with VF. She required multiple defibrillations and rounds of epinephrine with ultimate return of ROSC. EKG showed NSR with 3rd degree AV block and wide-complex escape. Transcutaneous pacing was started.  Past Medical History:  Diagnosis Date  . Diabetes mellitus without complication ([place])    History reviewed. No pertinent surgical history.  FH: Unable to obtain due to critical illness.  SH: Unable to obtain due to critical illness.  ROS: Unable to obtain due to critical illness.  PE: BP (!) 134/118   Pulse 80   Temp 98.1 F (36.7 C) (Oral)   Resp (!) 25   Ht 5\' 3"  (1.6 m)   Wt 135 lb (61.2 kg)   SpO2 97%   BMI 23.91 kg/m  Gen: Intubated, unresponsive. HEENT: ETT in place. Neck: Full. Resp: CTA anteriorly. CV: RRR without murmurs. Abd: Soft. Hypoactive BS. Ext: No LE edema. Trace radial and pedal pulses.  EKG: NSR with intermittent pacing. RBBB and LAFB.  Creatinine 0.9, K 4.6, WBC 13.5, HGB 12.8, PLT 353  A/P: 66 y/o woman with DM1, presenting with left ureteral stone and concern for infection, complicated by cardiac arrest with VF and subsequent complete heart block. Case discussed with Drs. Jimmye Norman and [place]. We will proceed with emergent LHC and possible PCI as well as possible temporary pacing wire. Procedure discussed with Ms. Severa' husband, who provided emergent verbal consent.  Nelva Bush, MD [facility] Pager: [phone]

## 2016-10-11 NOTE — ED Notes (Addendum)
1 amp epi and 1 amp atropine given at this time. Pt continues with femoral pulse, however pt hr down to 40's.  BP 127/31

## 2016-10-11 NOTE — ED Notes (Signed)
300mg  amiodarone IV push per orders frim Dr Jimmye Norman.  Pt back into vfib, shocked again.

## 2016-10-11 NOTE — ED Notes (Signed)
Pt pulseless again.  CPR restarted with lucas device.

## 2016-10-11 NOTE — ED Notes (Signed)
+   femoral pulse.  Attempting to use transcutaneous pacing at this time.  Dr End at the bedside.

## 2016-10-11 NOTE — ED Notes (Addendum)
Code started, CPR in progress.  RT to bedside. Pt skin color blue, mottled in extremities.

## 2016-10-11 NOTE — ED Notes (Addendum)
Dr Alva Garnet at bedside. Ice packs placed to groin and centrally.  Pt prepared for transport to cath lab with zoll and monitor.  Pt currently transcutaneously paced.  Dr End at bedside.

## 2016-10-11 NOTE — ED Notes (Addendum)
Pt in complete heart block on EKG.  Dr Jimmye Norman speaking to cath team at this time. Pt skin color is improved.

## 2016-10-11 NOTE — ED Notes (Signed)
Ice chips given to pt.  

## 2016-10-11 NOTE — ED Notes (Addendum)
Pt lost pulse,  Pt given epi 1 amp, IV per orders from dr Jimmye Norman.  CPR restarted. Pt in PEA on monitor.

## 2016-10-11 NOTE — Consult Note (Signed)
Consult: Left distal ureteral stone, UTI Requested by: Dr. Lenise Arena  History of Present Illness: 66 year old white female who presented to the emergency department this morning with left flank pain, nausea and urgency. CT scan was done which showed a 4 mm left distal stone with mild proximal hydroureteronephrosis. She was stable with normal vital signs and no fever. White count was 13.5, creatinine 0.88 and UA showed many bacteria. I was paged to ED and briefly spoke to Dr. Jimmye Norman who mentioned the patient's name, concern for UTI and passage of a ureteral stone, but he abruptly terminated the call. I reviewed the patient's chart and images and called back to discuss with Dr. Jimmye Norman.  I was going to recommend a shot of Rocephin and then start po antibiotics and tamsulosin with outpatient follow-up given the patient may have a UTI, but normal kidney function and no signs of pyelonephritis or SIRS. Also, stone passage can cause a WBC of 13. Dr. Jimmye Norman informed me of the patient's code (V fib arrest) following rocephin administration, heart cath and admission. Patient now in ICU waiting for return of neurologic function. I discussed with Dr. Alva Garnet.   Past Medical History:  Diagnosis Date  . Diabetes mellitus without complication [facility])    Past Surgical History:  Procedure Laterality Date  . ABDOMINAL AORTOGRAM N/A 10/11/2016   Procedure: ABDOMINAL AORTOGRAM;  Surgeon: Nelva Bush, MD;  Location: [place] [facility];  Service: Cardiovascular;  Laterality: N/A;  . LEFT HEART CATH AND CORONARY ANGIOGRAPHY N/A 10/11/2016   Procedure: LEFT HEART CATH AND CORONARY ANGIOGRAPHY;  Surgeon: Nelva Bush, MD;  Location: [place] [facility];  Service: Cardiovascular;  Laterality: N/A;    Home Medications:  Prescriptions Prior to Admission  Medication Sig Dispense Refill Last Dose  . ALPRAZolam (XANAX) 0.25 MG tablet Take 0.25 mg by mouth daily.   10/10/2016 at 0800  . aspirin EC 81 MG  tablet Take 81 mg by mouth daily.   10/10/2016 at 0800  . diltiazem (CARDIZEM CD) 120 MG 24 hr capsule Take 1 capsule by mouth daily.  2 10/10/2016 at 0800  . ezetimibe-simvastatin (VYTORIN) 10-20 MG tablet Take 1 tablet by mouth daily.   10/10/2016 at 0800  . HUMALOG 100 UNIT/ML injection TO BE USED WITH INSULIN PUMP  2 10/11/2016 at Unknown time  . Vitamin D, Ergocalciferol, (DRISDOL) 50000 units CAPS capsule Take 50,000 Units by mouth every 30 (thirty) days.   Past Month at 0800  . brompheniramine-pseudoephedrine-DM 30-2-10 MG/5ML syrup Take 5 mLs by mouth 4 (four) times daily as needed. (Patient not taking: Reported on 10/11/2016) 120 mL 0 Not Taking at Unknown time  . sulfamethoxazole-trimethoprim (BACTRIM DS,SEPTRA DS) 800-160 MG tablet Take 1 tablet by mouth 2 (two) times daily. (Patient not taking: Reported on 10/11/2016) 20 tablet 0 Not Taking at Unknown time   Allergies:  Allergies  Allergen Reactions  . Ceftriaxone     Anaphylaxis, cardiac arrest    No family history on file. Social History:  reports that she has quit smoking. She has never used smokeless tobacco. She reports that she drinks alcohol. She reports that she does not use drugs.  ROS: A complete review of systems was performed.  All systems are negative except for pertinent findings as noted. ROS   Physical Exam:  Vital signs in last 24 hours: Temp:  [95.7 F (35.4 C)-98.1 F (36.7 C)] 95.7 F (35.4 C) (09/11 1435) Pulse Rate:  [76-105] 99 (09/11 1435) Resp:  [11-25] 22 (09/11 1300)  BP: (89-193)/(31-168) 100/66 (09/11 1435) SpO2:  [86 %-100 %] 99 % (09/11 1435) FiO2 (%):  [60 %-100 %] 100 % (09/11 1435) Weight:  [61.2 kg (135 lb)-67.6 kg (149 lb 0.5 oz)] 67.6 kg (149 lb 0.5 oz) (09/11 1238) General:  Intubated  HEENT: Normocephalic, atraumatic Cardiovascular: Regular rate and rhythm Lungs: Regular rate on vent Abdomen: Soft, nontender, nondistended, no abdominal masses Extremities: No edema Neurologic:  unresponsive  Laboratory Data:  Results for orders placed or performed during the hospital encounter of 10/11/16 (from the past 24 hour(s))  Urinalysis, Complete w Microscopic     Status: Abnormal   Collection Time: 10/11/16  7:11 AM  Result Value Ref Range   Color, Urine YELLOW (A) YELLOW   APPearance CLOUDY (A) CLEAR   Specific Gravity, Urine 1.011 1.005 - 1.030   pH 7.0 5.0 - 8.0   Glucose, UA NEGATIVE NEGATIVE mg/dL   Hgb urine dipstick MODERATE (A) NEGATIVE   Bilirubin Urine NEGATIVE NEGATIVE   Ketones, ur NEGATIVE NEGATIVE mg/dL   Protein, ur 30 (A) NEGATIVE mg/dL   Nitrite POSITIVE (A) NEGATIVE   Leukocytes, UA LARGE (A) NEGATIVE   RBC / HPF TOO NUMEROUS TO COUNT 0 - 5 RBC/hpf   WBC, UA TOO NUMEROUS TO COUNT 0 - 5 WBC/hpf   Bacteria, UA MANY (A) NONE SEEN   Squamous Epithelial / LPF 0-5 (A) NONE SEEN   WBC Clumps PRESENT    Mucus PRESENT   CBC with Differential     Status: Abnormal   Collection Time: 10/11/16  7:11 AM  Result Value Ref Range   WBC 13.5 (H) 3.6 - 11.0 K/uL   RBC 4.62 3.80 - 5.20 MIL/uL   Hemoglobin 12.8 12.0 - 16.0 g/dL   HCT 38.0 35.0 - 47.0 %   MCV 82.3 80.0 - 100.0 fL   MCH 27.7 26.0 - 34.0 pg   MCHC 33.6 32.0 - 36.0 g/dL   RDW 13.5 11.5 - 14.5 %   Platelets 353 150 - 440 K/uL   Neutrophils Relative % 85 %   Neutro Abs 11.5 (H) 1.4 - 6.5 K/uL   Lymphocytes Relative 10 %   Lymphs Abs 1.4 1.0 - 3.6 K/uL   Monocytes Relative 1 %   Monocytes Absolute 0.1 (L) 0.2 - 0.9 K/uL   Eosinophils Relative 3 %   Eosinophils Absolute 0.4 0 - 0.7 K/uL   Basophils Relative 1 %   Basophils Absolute 0.1 0 - 0.1 K/uL  Comprehensive metabolic panel     Status: Abnormal   Collection Time: 10/11/16  7:11 AM  Result Value Ref Range   Sodium 134 (L) 135 - 145 mmol/L   Potassium 4.6 3.5 - 5.1 mmol/L   Chloride 98 (L) 101 - 111 mmol/L   CO2 27 22 - 32 mmol/L   Glucose, Bld 222 (H) 65 - 99 mg/dL   BUN 13 6 - 20 mg/dL   Creatinine, Ser 0.88 0.44 - 1.00 mg/dL    Calcium 9.0 8.9 - 10.3 mg/dL   Total Protein 7.5 6.5 - 8.1 g/dL   Albumin 3.8 3.5 - 5.0 g/dL   AST 22 15 - 41 U/L   ALT 21 14 - 54 U/L   Alkaline Phosphatase 80 38 - 126 U/L   Total Bilirubin 0.6 0.3 - 1.2 mg/dL   GFR calc non Af Amer >60 >60 mL/min   GFR calc Af Amer >60 >60 mL/min   Anion gap 9 5 - 15  Lipase, blood  Status: None   Collection Time: 10/11/16  7:11 AM  Result Value Ref Range   Lipase 18 11 - 51 U/L  Glucose, capillary     Status: Abnormal   Collection Time: 10/11/16  9:56 AM  Result Value Ref Range   Glucose-Capillary 132 (H) 65 - 99 mg/dL  Glucose, capillary     Status: Abnormal   Collection Time: 10/11/16 10:42 AM  Result Value Ref Range   Glucose-Capillary 367 (H) 65 - 99 mg/dL  Protime-INR     Status: Abnormal   Collection Time: 10/11/16 12:37 PM  Result Value Ref Range   Prothrombin Time 20.0 (H) 11.4 - 15.2 seconds   INR 1.72   APTT     Status: Abnormal   Collection Time: 10/11/16 12:37 PM  Result Value Ref Range   aPTT 55 (H) 24 - 36 seconds  Basic metabolic panel     Status: Abnormal   Collection Time: 10/11/16 12:37 PM  Result Value Ref Range   Sodium 137 135 - 145 mmol/L   Potassium 3.2 (L) 3.5 - 5.1 mmol/L   Chloride 105 101 - 111 mmol/L   CO2 16 (L) 22 - 32 mmol/L   Glucose, Bld 291 (H) 65 - 99 mg/dL   BUN 14 6 - 20 mg/dL   Creatinine, Ser 0.99 0.44 - 1.00 mg/dL   Calcium 7.8 (L) 8.9 - 10.3 mg/dL   GFR calc non Af Amer 59 (L) >60 mL/min   GFR calc Af Amer >60 >60 mL/min   Anion gap 16 (H) 5 - 15  Troponin I     Status: Abnormal   Collection Time: 10/11/16 12:37 PM  Result Value Ref Range   Troponin I 7.82 (HH) <0.03 ng/mL  Glucose, capillary     Status: Abnormal   Collection Time: 10/11/16 12:40 PM  Result Value Ref Range   Glucose-Capillary 314 (H) 65 - 99 mg/dL  Glucose, capillary     Status: Abnormal   Collection Time: 10/11/16  2:23 PM  Result Value Ref Range   Glucose-Capillary 289 (H) 65 - 99 mg/dL  Blood gas, arterial      Status: Abnormal   Collection Time: 10/11/16  3:30 PM  Result Value Ref Range   FIO2 0.40    Delivery systems VENTILATOR    Mode PRESSURE REGULATED VOLUME CONTROL    VT 500 mL   LHR 14 resp/min   Peep/cpap 5.0 cm H20   pH, Arterial 7.23 (L) 7.350 - 7.450   pCO2 arterial 36 32.0 - 48.0 mmHg   pO2, Arterial 123 (H) 83.0 - 108.0 mmHg   Bicarbonate 14.7 (L) 20.0 - 28.0 mmol/L   Acid-base deficit 12.1 (H) 0.0 - 2.0 mmol/L   O2 Saturation 98.3 %   Patient temperature 36.0    Collection site LEFT RADIAL    Sample type ARTERIAL DRAW    Allens test (pass/fail) PASS PASS  Glucose, capillary     Status: Abnormal   Collection Time: 10/11/16  3:36 PM  Result Value Ref Range   Glucose-Capillary 266 (H) 65 - 99 mg/dL   No results found for this or any previous visit (from the past 240 hour(s)). Creatinine:  Recent Labs  10/11/16 0711 10/11/16 1237  CREATININE 0.88 0.99   I reviewed the chart notes, labs and CT images.   Impression/Assessment/plan:  Left ureteral stone - she does not need urgent urologic intervention and I was going to recommend outpatient follow-up prior to her cardiac arrest. There were no  systemic signs of infection, normal kidney function, and a white count that can occur simply by passing a kidney stone. However, I do agree with Cipro to treat possible urinary tract infection. Will follow.  Lisa [name] 10/11/2016, 4:24 PM

## 2016-10-11 NOTE — ED Provider Notes (Addendum)
[facility] Emergency Department Provider Note       Time seen: ----------------------------------------- 6:56 AM on 10/11/2016 -----------------------------------------     I have reviewed the triage vital signs and the nursing notes.   HISTORY   Chief Complaint Flank Pain    HPI Lisa [name] is a 65 y.o. female who presents to the ED for pain in the flank area with nausea and pressure in her bladder. Pain is reportedly 7 out of 10 in the left flank, nothing makes it better or worse.patient reports she has never had pain like this before, she has had a kidney stone once many years ago. She states the pain is intermittent and sharp and dull.   Past Medical History:  Diagnosis Date  . Diabetes mellitus without complication ([place])     There are no active problems to display for this patient.   History reviewed. No pertinent surgical history.  Allergies Patient has no known allergies.  Social History Social History  Substance Use Topics  . Smoking status: Former Research scientist (life sciences)  . Smokeless tobacco: Never Used  . Alcohol use 0.0 oz/week    Review of Systems Constitutional: Negative for fever. Cardiovascular: Negative for chest pain. Respiratory: Negative for shortness of breath. Gastrointestinal: positive for flank pain, nausea Genitourinary: negative for dysuria Musculoskeletal: Negative for back pain. Skin: Negative for rash. Neurological: Negative for headaches, focal weakness or numbness.  All systems negative/normal/unremarkable except as stated in the HPI  ____________________________________________   PHYSICAL EXAM:  VITAL SIGNS: ED Triage Vitals  Enc Vitals Group     BP 10/11/16 0628 (!) 161/76     Pulse Rate 10/11/16 0628 76     Resp 10/11/16 0628 18     Temp 10/11/16 0628 98.1 F (36.7 C)     Temp Source 10/11/16 0628 Oral     SpO2 10/11/16 0628 99 %     Weight 10/11/16 0625 135 lb (61.2 kg)     Height 10/11/16 0625 5'  3" (1.6 m)     Head Circumference --      Peak Flow --      Pain Score 10/11/16 0625 7     Pain Loc --      Pain Edu? --      Excl. in [place]? --     Constitutional: Alert and oriented. mild distress Eyes: Conjunctivae are normal. Normal extraocular movements. ENT   Head: Normocephalic and atraumatic.   Nose: No congestion/rhinnorhea.   Mouth/Throat: Mucous membranes are moist.   Neck: No stridor. Cardiovascular: Normal rate, regular rhythm. No murmurs, rubs, or gallops. Respiratory: Normal respiratory effort without tachypnea nor retractions. Breath sounds are clear and equal bilaterally. No wheezes/rales/rhonchi. Gastrointestinal: mild left flank tenderness, rebound or guarding. Normal bowel sounds. Musculoskeletal: Nontender with normal range of motion in extremities. No lower extremity tenderness nor edema. Neurologic:  Normal speech and language. No gross focal neurologic deficits are appreciated.  Skin:  Skin is warm, dry and intact. No rash noted. Psychiatric: Mood and affect are normal. Speech and behavior are normal.  ____________________________________________  ED COURSE:  Pertinent labs & imaging results that were available during my care of the patient were reviewed by me and considered in my medical decision making (see chart for details). Patient presents for flank pain, we will assess with labs and imaging as indicated.   Procedures ____________________________________________   LABS (pertinent positives/negatives)  Labs Reviewed  URINALYSIS, COMPLETE (UACMP) WITH MICROSCOPIC - Abnormal; Notable for the following:  Result Value   Color, Urine YELLOW (*)    APPearance CLOUDY (*)    Hgb urine dipstick MODERATE (*)    Protein, ur 30 (*)    Nitrite POSITIVE (*)    Leukocytes, UA LARGE (*)    Bacteria, UA MANY (*)    Squamous Epithelial / LPF 0-5 (*)    All other components within normal limits  CBC WITH DIFFERENTIAL/PLATELET - Abnormal; Notable  for the following:    WBC 13.5 (*)    Neutro Abs 11.5 (*)    Monocytes Absolute 0.1 (*)    All other components within normal limits  COMPREHENSIVE METABOLIC PANEL - Abnormal; Notable for the following:    Sodium 134 (*)    Chloride 98 (*)    Glucose, Bld 222 (*)    All other components within normal limits  LIPASE, BLOOD    RADIOLOGY Images were viewed by me  CT renal protocol IMPRESSION: 1. 4 mm distal left ureteral calculus with mild hydroureteronephrosis. 2. Punctate nonobstructing bilateral renal calculi. 3. Small hiatal hernia. 4. Aortic Atherosclerosis (ICD10-I70.0). Mild infrarenal aortic ectasia and suspected short segment aortic dissection. ____________________________________________  FINAL ASSESSMENT AND PLAN  Renal colic, UTI  Plan: Patient's labs and imaging were dictated above. Patient had presented for flank pain secondary to renal colic. She did have a 4 mm distal left ureteral stone. Currently the pain is significantly improved, but there are concerning signs for infection. I ordered IV antibiotics for her and I will discuss with urology for admission.  [place]  Department of Emergency Medicine   Code Blue Note  History of present illness: in the process of patient receiving IV Rocephin for UTI she subsequently arrested, possibly from anaphylaxis   Scheduled Meds: . diphenhydrAMINE      . EPINEPHrine      . methylPREDNISolone sodium succinate      .  morphine injection  4 mg Intravenous Once   Continuous Infusions: . epinephrine     PRN Meds:. Past Medical History:  Diagnosis Date  . Diabetes mellitus without complication ([place])    History reviewed. No pertinent surgical history. Social History   Social History  . Marital status: Married    Spouse name: N/A  . Number of children: N/A  . Years of education: N/A   Occupational History  . Not on file.   Social History Main Topics  . Smoking status: Former Research scientist (life sciences)  .  Smokeless tobacco: Never Used  . Alcohol use 0.0 oz/week  . Drug use: No  . Sexual activity: Not Currently   Other Topics Concern  . Not on file   Social History Narrative  . No narrative on file   No Known Allergies  Last set of Vital Signs (not current) Vitals:   10/11/16 1032 10/11/16 1036  BP: 111/63 (!) 134/118  Pulse:  80  Resp: 19 (!) 25  Temp:    SpO2:  97%      Physical Exam  Gen: unresponsive Cardiovascular: pulseless  Resp: apneic. Breath sounds equal bilaterally with bagging  Abd: nondistended  Neuro: GCS 3, unresponsive to pain  HEENT: No blood in posterior pharynx, present gag reflex Neck: No crepitus  Musculoskeletal: No deformity  Skin: cyanotic  Procedures  INTUBATION Performed by: Lenise Arena E Required items: required blood products, implants, devices, and special equipment available Patient identity confirmed: provided demographic data and hospital-assigned identification number Time out: Immediately prior to procedure a "time out" was called to verify the  correct patient, procedure, equipment, support staff and site/side marked as required. Indications: arrest Intubation method: Glydescope Preoxygenation: 100%BVM Sedatives: etomidate Paralytic: succinylcholine Tube Size: 7.5 cuffed Post-procedure assessment: chest rise and ETCO2 monitor Breath sounds: equal and absent over the epigastrium Tube secured by Respiratory Therapy Patient tolerated the procedure well with no immediate complications.  CENTRAL LINE Performed by: Lenise Arena E Consent: The procedure was performed in an emergent situation. Required items: required blood products, implants, devices, and special equipment available Patient identity confirmed: arm band and provided demographic data Time out: Immediately prior to procedure a "time out" was called to verify the correct patient, procedure, equipment, support staff and site/side marked as  required. Indications: vascular access Anesthesia: local infiltration Local anesthetic: none Preparation: skin prepped with 2% chlorhexidine Skin prep agent dried: skin prep agent completely dried prior to procedure Sterile barriers: all five maximum sterile barriers used - cap, mask, sterile gown, sterile gloves, and large sterile sheet Hand hygiene: hand hygiene performed prior to central venous catheter insertion  Location details: right femoral  Catheter type: triple lumen Catheter size: 8 Fr Pre-procedure: landmarks identified Ultrasound guidance: no Successful placement: yes Post-procedure: line sutured and dressing applied Assessment: blood return through all parts, free fluid flow, placement verified by x-ray and no pneumothorax on x-ray Patient tolerance: Patient tolerated the procedure well with no immediate complications.  CRITICAL CARE Performed by: Earleen Newport Total critical care time: 45 Critical care time was exclusive of separately billable procedures and treating other patients. Critical care was necessary to treat or prevent imminent or life-threatening deterioration. Critical care was time spent personally by me on the following activities: development of treatment plan with patient and/or surrogate as well as nursing, discussions with consultants, evaluation of patient's response to treatment, examination of patient, obtaining history from patient or surrogate, ordering and performing treatments and interventions, ordering and review of laboratory studies, ordering and review of radiographic studies, pulse oximetry and re-evaluation of patient's condition.  Cardiopulmonary Resuscitation (CPR) Procedure Note  Directed/Performed by: Earleen Newport I personally directed ancillary staff and/or performed CPR in an effort to regain return of spontaneous circulation and to maintain cardiac, neuro and systemic perfusion.    Medical Decision making  likely  anaphylactic reaction which led to cardiac arrest. There is likely underlying cardiac disease, patient presented in numerous different unstable rhythms. She was in [place], followed by pulseless V. tach as well as ventricular fibrillation. She was shocked numerous times, standard ACLS protocol drugs were given IV including epinephrine and atropine. She was also given magnesium and amiodarone. Patient had transient return of circulation but subsequently did have return of circulation after around 25-30 minutes of CPR and advanced cardiac life support.  Assessment and Plan  anaphylaxis, likely MI, intubation, central venous line placement, transcutaneous pacing, defibrillation  Patient was transiently paced under the direction of Dr. Saunders Revel. Currently she has stable vital signs and will be taken to the Cath Lab for emergent heart catheterization. She will be a code ice protocol as well and we have initiated cooling measures while in the ER. She remains in critical condition.  Earleen Newport, MD   Note: This note was generated in part or whole with voice recognition software. Voice recognition is usually quite accurate but there are transcription errors that can and very often do occur. I apologize for any typographical errors that were not detected and corrected.     Earleen Newport, MD 10/11/16 7412    Earleen Newport,  MD 10/11/16 9842    Earleen Newport, MD 10/11/16 1110

## 2016-10-11 NOTE — ED Notes (Signed)
50 mg iv benadryl given at this time, CPR continues.

## 2016-10-11 NOTE — ED Notes (Signed)
1 amp epi given at this time.

## 2016-10-11 NOTE — ED Notes (Signed)
Pt shocked for V fib rhythm , pt continues to be in Vfib shocked again.  CPR continues.

## 2016-10-11 NOTE — ED Notes (Signed)
20 mg etomidate and 100 mg succ given iv per orders from dr Jimmye Norman, cpr continues.

## 2016-10-11 NOTE — ED Notes (Addendum)
Pt in v fib, shocked at [place], pt remains in v fib/vtach.  Shocked again.

## 2016-10-11 NOTE — Progress Notes (Signed)
   10/11/16 1036  Clinical Encounter Type  Visited With Patient not available;Health care provider  Visit Type Initial;Code  Referral From Nurse   [place] responded to code St[place]. Patient was in room with medical staff. Patient's family had just left to get food. Nurse paged stating family was back. CH met with patient's husband, daughter, and pastor as they were led to the special recovery waiting room. CH offered emotional support and prayer. Patient was taken in to Cath Lab for procedure.

## 2016-10-11 NOTE — ED Notes (Addendum)
Dr Jimmye Norman attempting intubation.  cpr continues. 125 mg solumedrol given per orders from dr Jimmye Norman.

## 2016-10-11 NOTE — ED Notes (Signed)
CODE  STEMI  CALLED TO  DOUG  AT  [facility]

## 2016-10-11 NOTE — Consult Note (Signed)
Cardiology Consultation Note  Patient ID: Lisa [name], MRN: [id number], DOB/AGE: 1950/06/11 66 y.o. Admit date: 10/11/2016   Date of Consult: 10/11/2016 Primary Physician: System, Pcp Not In Primary Cardiologist: New to [facility] - consult by End Requesting Physician: Dr. Jimmye Norman, MD  Chief Complaint: Flank pain Reason for Consult: V fib arrest  HPI: Lisa [name] is a 66 y.o. female who is being seen today for the evaluation of V fib arrest at the request of Dr. Jimmye Norman, MD. Patient has a h/o DM, other history unknown (called acutely with possible CHB/cardiac arrest with compressions occurring when we arrived to the room) who presented to [facility] on 9/11 with flank pain. While in the ED she developed Vfib arrest in the ED.   No prior known cardiac history. Patient presented to the ED with flank pain with nausea and bladder pressure per prior note. Never had pain like this before. She underwent CT renal stone protocol that showed a 4 mm distal left ureteral calculus with mild hydroureteronephrosis. IV Rocephin was started at 9:45 AM with patient immediately reporting she could not breath. SHe became red and started to have seizure-like activity. She lost control of bowel and urine function, then lost pulse and stopped breathing. CPR was started at 9:50 AM. She received 6 amps of epi, 1 amp of atropine, she was intubated with 125 mg Solumedrol being given, along with etomidate and succ, 50 mg of Benadryl. She was shocked for Vfib x 4, 300 mg IV amiodarone was pushed, 2 gm magnesium hung, femoral pulse noted at 10:14 AM, heart rate down into the 40s bpm with femoral pulse, CHB noted followed by PEA with restarting of CPR. Code STEMI called at 10:30 AM. Femoral pulse regained at 10:30 AM with heart rates in the 40s bpm. Patient lost pulse at 10:33 AM with CPR restarted with [facility] device. Regained pulse at 10:34 AM with transcutaneous pacing started at 10:34. ICU MD consulted. Patient stabilized and taken  emergently to the cath lab.     Past Medical History:  Diagnosis Date  . Diabetes mellitus without complication ([place])       Most Recent Cardiac Studies: none   Surgical History: History reviewed. No pertinent surgical history.   Home Meds: Prior to Admission medications   Medication Sig Start Date End Date Taking? Authorizing Provider  ALPRAZolam (XANAX) 0.25 MG tablet Take 0.25 mg by mouth daily.   Yes [provider]  aspirin EC 81 MG tablet Take 81 mg by mouth daily.   Yes [provider]  diltiazem (CARDIZEM CD) 120 MG 24 hr capsule Take 1 capsule by mouth daily. 09/29/16  Yes [provider]  ezetimibe-simvastatin (VYTORIN) 10-20 MG tablet Take 1 tablet by mouth daily.   Yes [provider]  HUMALOG 100 UNIT/ML injection TO BE USED WITH INSULIN PUMP 09/14/16  Yes [provider]  Vitamin D, Ergocalciferol, (DRISDOL) 50000 units CAPS capsule Take 50,000 Units by mouth every 30 (thirty) days.   Yes [provider]  brompheniramine-pseudoephedrine-DM 30-2-10 MG/5ML syrup Take 5 mLs by mouth 4 (four) times daily as needed. Patient not taking: Reported on 10/11/2016 05/29/15   Sable Feil, PA-C  sulfamethoxazole-trimethoprim (BACTRIM DS,SEPTRA DS) 800-160 MG tablet Take 1 tablet by mouth 2 (two) times daily. Patient not taking: Reported on 10/11/2016 05/29/15   Sable Feil, PA-C    Inpatient Medications:  . diphenhydrAMINE      . EPINEPHrine      . methylPREDNISolone sodium succinate      .  morphine injection  4 mg Intravenous Once   . epinephrine      Allergies: No Known Allergies  Social History   Social History  . Marital status: Married    Spouse name: N/A  . Number of children: N/A  . Years of education: N/A   Occupational History  . Not on file.   Social History Main Topics  . Smoking status: Former Research scientist (life sciences)  . Smokeless tobacco: Never Used  . Alcohol use 0.0 oz/week  . Drug use: No  . Sexual  activity: Not Currently   Other Topics Concern  . Not on file   Social History Narrative  . No narrative on file     No family history on file. - unable to acquire family history given acute illness/patient intubated and sedated and no family present  Review of Systems: Review of Systems  Unable to perform ROS: Intubated    Labs: No results for input(s): CKTOTAL, CKMB, TROPONINI in the last 72 hours. Lab Results  Component Value Date   WBC 13.5 (H) 10/11/2016   HGB 12.8 10/11/2016   HCT 38.0 10/11/2016   MCV 82.3 10/11/2016   PLT 353 10/11/2016     Recent Labs Lab 10/11/16 0711  NA 134*  K 4.6  CL 98*  CO2 27  BUN 13  CREATININE 0.88  CALCIUM 9.0  PROT 7.5  BILITOT 0.6  ALKPHOS 80  ALT 21  AST 22  GLUCOSE 222*   No results found for: CHOL, HDL, LDLCALC, TRIG No results found for: DDIMER  Radiology/Studies:  Ct Renal Stone Study  Result Date: 10/11/2016 IMPRESSION: 1. 4 mm distal left ureteral calculus with mild hydroureteronephrosis. 2. Punctate nonobstructing bilateral renal calculi. 3. Small hiatal hernia. 4. Aortic Atherosclerosis (ICD10-I70.0). Mild infrarenal aortic ectasia and suspected short segment aortic dissection. Electronically Signed   By: Logan Bores M.D.   On: 10/11/2016 08:20    EKG: Interpreted by me showed: CHB, 52 bpm LAFB, RBBB, nonspecific st/t changes  Telemetry: Interpreted by me showed: PVCs, CHB, paced rhythm   Weights: Filed Weights   10/11/16 0625  Weight: 135 lb (61.2 kg)     Physical Exam: Blood pressure (!) 134/118, pulse 80, temperature 98.1 F (36.7 C), temperature source Oral, resp. rate (!) 25, height 5\' 3"  (1.6 m), weight 135 lb (61.2 kg), SpO2 97 %. Body mass index is 23.91 kg/m. General: Critically ill appearing. Head: Normocephalic, atraumatic, sclera non-icteric, no xanthomas, nares are without discharge.  Neck: Negative for carotid bruits. JVD not elevated. Lungs: Diminished breath sounds bilaterally.  Intubated.  Heart: paced with S1 S2. No murmurs, rubs, or gallops appreciated. Abdomen: Soft, non-tender, non-distended with normoactive bowel sounds. No hepatomegaly. No obvious abdominal masses. Msk:  Strength and tone appear normal for age. Extremities: No clubbing or cyanosis. No edema. Distal pedal pulses are 1+ and equal bilaterally. Neuro: Intubated and sedated. Psych:  Intubated and sedated.    Assessment and Plan:  Active Problems:   Cardiac arrest with ventricular fibrillation ([place])    1. Vfib arrest/CHB/cardiogenic shock: -ACLS as above -Stabilized in the ED  -Taken emergently to the cath lab -Transcutaneous pacing with possible venous temp wire placement in the cath lab -Stat TTE -Not given ASA or heparin prior to the above given the acuity of her illness -Cooling protocol per ICU -When warmed, plan for neurology evaluation with EEG -Maintain MAP > 65 mmHg -Check labs including cycling of troponin until peaks -Consider EP evaluation for possible ICD if meaningful recovery  Signed, Christell Faith, PA-C [place] Pager: [phone] 10/11/2016, 11:04 AM

## 2016-10-11 NOTE — ED Notes (Signed)
1 amp epi given at this time. Pt intubated with 7.5 tube 24 at the lip

## 2016-10-11 NOTE — ED Notes (Signed)
1 amp epi given at this time. Pt +femoral pulse, HR 48

## 2016-10-11 NOTE — ED Notes (Addendum)
1 amp epi given iv per orders from dr Jimmye Norman. CPR continues. Pt being bagged at this time, via ET tube.

## 2016-10-11 NOTE — ED Notes (Signed)
2 gm mag started at this time.  Pt in PEA, no pulse noted, CPR continues.  1 amp epi given at this time.

## 2016-10-11 NOTE — ED Notes (Signed)
CPR continues.  

## 2016-10-12 ENCOUNTER — Encounter: Payer: Self-pay | Admitting: Physician Assistant

## 2016-10-12 ENCOUNTER — Inpatient Hospital Stay (HOSPITAL_COMMUNITY)
Admit: 2016-10-12 | Discharge: 2016-10-12 | Disposition: A | Discharge status: Home / Self Care | Payer: Managed Care, Other (non HMO) | Attending: Internal Medicine | Admitting: Internal Medicine

## 2016-10-12 ENCOUNTER — Inpatient Hospital Stay: Payer: Managed Care, Other (non HMO)

## 2016-10-12 DIAGNOSIS — J9690 Respiratory failure, unspecified, unspecified whether with hypoxia or hypercapnia: Secondary | ICD-10-CM | POA: Diagnosis not present

## 2016-10-12 DIAGNOSIS — D72829 Elevated white blood cell count, unspecified: Secondary | ICD-10-CM | POA: Diagnosis present

## 2016-10-12 DIAGNOSIS — I469 Cardiac arrest, cause unspecified: Secondary | ICD-10-CM

## 2016-10-12 DIAGNOSIS — I214 Non-ST elevation (NSTEMI) myocardial infarction: Secondary | ICD-10-CM

## 2016-10-12 DIAGNOSIS — Z87892 Personal history of anaphylaxis: Secondary | ICD-10-CM | POA: Diagnosis not present

## 2016-10-12 DIAGNOSIS — N179 Acute kidney failure, unspecified: Secondary | ICD-10-CM | POA: Diagnosis not present

## 2016-10-12 DIAGNOSIS — I4901 Ventricular fibrillation: Secondary | ICD-10-CM

## 2016-10-12 DIAGNOSIS — N201 Calculus of ureter: Secondary | ICD-10-CM

## 2016-10-12 DIAGNOSIS — N3001 Acute cystitis with hematuria: Secondary | ICD-10-CM

## 2016-10-12 DIAGNOSIS — T782XXA Anaphylactic shock, unspecified, initial encounter: Secondary | ICD-10-CM | POA: Diagnosis not present

## 2016-10-12 DIAGNOSIS — N200 Calculus of kidney: Secondary | ICD-10-CM

## 2016-10-12 DIAGNOSIS — E108 Type 1 diabetes mellitus with unspecified complications: Secondary | ICD-10-CM

## 2016-10-12 DIAGNOSIS — R570 Cardiogenic shock: Secondary | ICD-10-CM | POA: Diagnosis not present

## 2016-10-12 HISTORY — DX: Non-ST elevation (NSTEMI) myocardial infarction: I21.4

## 2016-10-12 LAB — GLUCOSE, CAPILLARY
GLUCOSE-CAPILLARY: 151 mg/dL — AB (ref 65–99)
GLUCOSE-CAPILLARY: 171 mg/dL — AB (ref 65–99)
GLUCOSE-CAPILLARY: 231 mg/dL — AB (ref 65–99)
GLUCOSE-CAPILLARY: 262 mg/dL — AB (ref 65–99)
GLUCOSE-CAPILLARY: 97 mg/dL (ref 65–99)
Glucose-Capillary: 126 mg/dL — ABNORMAL HIGH (ref 65–99)
Glucose-Capillary: 225 mg/dL — ABNORMAL HIGH (ref 65–99)
Glucose-Capillary: 208 mg/dL — ABNORMAL HIGH (ref 65–99)
Glucose-Capillary: 183 mg/dL — ABNORMAL HIGH (ref 65–99)
Glucose-Capillary: 164 mg/dL — ABNORMAL HIGH (ref 65–99)
Glucose-Capillary: 150 mg/dL — ABNORMAL HIGH (ref 65–99)
Glucose-Capillary: 118 mg/dL — ABNORMAL HIGH (ref 65–99)
Glucose-Capillary: 95 mg/dL (ref 65–99)
Glucose-Capillary: 96 mg/dL (ref 65–99)
Glucose-Capillary: 118 mg/dL — ABNORMAL HIGH (ref 65–99)
Glucose-Capillary: 144 mg/dL — ABNORMAL HIGH (ref 65–99)
Glucose-Capillary: 195 mg/dL — ABNORMAL HIGH (ref 65–99)
Glucose-Capillary: 152 mg/dL — ABNORMAL HIGH (ref 65–99)
Glucose-Capillary: 146 mg/dL — ABNORMAL HIGH (ref 65–99)
Glucose-Capillary: 124 mg/dL — ABNORMAL HIGH (ref 65–99)
Glucose-Capillary: 121 mg/dL — ABNORMAL HIGH (ref 65–99)
Glucose-Capillary: 144 mg/dL — ABNORMAL HIGH (ref 65–99)

## 2016-10-12 LAB — BASIC METABOLIC PANEL
Anion gap: 5 (ref 5–15)
Anion gap: 7 (ref 5–15)
BUN: 21 mg/dL — ABNORMAL HIGH (ref 6–20)
BUN: 22 mg/dL — AB (ref 6–20)
CALCIUM: 6.7 mg/dL — AB (ref 8.9–10.3)
CALCIUM: 6.7 mg/dL — AB (ref 8.9–10.3)
CO2: 20 mmol/L — ABNORMAL LOW (ref 22–32)
CO2: 21 mmol/L — AB (ref 22–32)
CREATININE: 1.03 mg/dL — AB (ref 0.44–1.00)
CREATININE: 1.03 mg/dL — AB (ref 0.44–1.00)
Chloride: 109 mmol/L (ref 101–111)
Chloride: 107 mmol/L (ref 101–111)
GFR calc Af Amer: >60 mL/min (ref >60)
GFR calc non Af Amer: 56 mL/min — ABNORMAL LOW (ref >60)
GFR, EST NON AFRICAN AMERICAN: 56 mL/min — AB (ref >60)
GLUCOSE: 194 mg/dL — AB (ref 65–99)
Glucose, Bld: 246 mg/dL — ABNORMAL HIGH (ref 65–99)
Potassium: 4.6 mmol/L (ref 3.5–5.1)
Potassium: 4.5 mmol/L (ref 3.5–5.1)
SODIUM: 134 mmol/L — AB (ref 135–145)
Sodium: 135 mmol/L (ref 135–145)

## 2016-10-12 LAB — CBC WITH DIFFERENTIAL/PLATELET
Basophils Absolute: 0.1 10*3/uL (ref 0–0.1)
Basophils Relative: 0 %
Eosinophils Absolute: 0 10*3/uL (ref 0–0.7)
Eosinophils Relative: 0 %
HEMATOCRIT: 41.1 % (ref 35.0–47.0)
HEMOGLOBIN: 13.3 g/dL (ref 12.0–16.0)
LYMPHS ABS: 2.3 10*3/uL (ref 1.0–3.6)
Lymphocytes Relative: 4 %
MCH: 27.5 pg (ref 26.0–34.0)
MCHC: 32.3 g/dL (ref 32.0–36.0)
MCV: 85.1 fL (ref 80.0–100.0)
MONO ABS: 2.1 10*3/uL — AB (ref 0.2–0.9)
MONOS PCT: 4 %
NEUTROS ABS: 46.8 10*3/uL — AB (ref 1.4–6.5)
NEUTROS PCT: 92 %
Platelets: 348 10*3/uL (ref 150–440)
RBC: 4.84 MIL/uL (ref 3.80–5.20)
RDW: 14.2 % (ref 11.5–14.5)
WBC: 51.3 10*3/uL (ref 3.6–11.0)

## 2016-10-12 LAB — ECHOCARDIOGRAM COMPLETE
Height: 63 in
Weight: 2384.5 oz

## 2016-10-12 LAB — CBC
HCT: 38.6 % (ref 35.0–47.0)
Hemoglobin: 12.7 g/dL (ref 12.0–16.0)
MCH: 28 pg (ref 26.0–34.0)
MCHC: 33 g/dL (ref 32.0–36.0)
MCV: 84.7 fL (ref 80.0–100.0)
Platelets: 329 10*3/uL (ref 150–440)
RBC: 4.55 MIL/uL (ref 3.80–5.20)
RDW: 14.2 % (ref 11.5–14.5)
WBC: 51.8 10*3/uL (ref 3.6–11.0)

## 2016-10-12 LAB — TROPONIN I
Troponin I: 5.68 ng/mL (ref <0.03)
Troponin I: 3.19 ng/mL

## 2016-10-12 LAB — HEPARIN LEVEL (UNFRACTIONATED)
HEPARIN UNFRACTIONATED: 0.51 [IU]/mL (ref 0.30–0.70)
Heparin Unfractionated: 0.55 [IU]/mL (ref 0.30–0.70)
Heparin Unfractionated: 0.51 IU/mL (ref 0.30–0.70)

## 2016-10-12 LAB — TSH: TSH: 1.154 u[IU]/mL (ref 0.350–4.500)

## 2016-10-12 MED ORDER — SODIUM CHLORIDE 0.9% FLUSH: 10.0000 mL | INTRAVENOUS | Status: DC | PRN

## 2016-10-12 MED ORDER — INSULIN ASPART 100 UNIT/ML ~~LOC~~ SOLN
SUBCUTANEOUS | Status: AC
  Filled 2016-10-12: qty 1

## 2016-10-12 MED ORDER — INSULIN PUMP
Freq: Three times a day (TID) | SUBCUTANEOUS | Status: DC
  Filled 2016-10-12: qty 1

## 2016-10-12 MED ORDER — MORPHINE SULFATE (PF) 2 MG/ML IV SOLN
1.0000 mg | INTRAVENOUS | Status: DC | PRN
  Administered 2016-10-12: 1 mg via INTRAVENOUS
  Administered 2016-10-13: 2 mg via INTRAVENOUS
  Filled 2016-10-12 (×2): qty 1

## 2016-10-12 MED ORDER — IBUPROFEN 400 MG PO TABS
600.0000 mg | ORAL_TABLET | Freq: Four times a day (QID) | ORAL | Status: DC | PRN
  Administered 2016-10-12 – 2016-10-13 (×2): 600 mg via ORAL
  Filled 2016-10-12 (×2): qty 2

## 2016-10-12 MED ORDER — INSULIN GLARGINE 100 UNIT/ML ~~LOC~~ SOLN
15.0000 [IU] | Freq: Every day | SUBCUTANEOUS | Status: DC
  Administered 2016-10-12 – 2016-10-13 (×2): 15 [IU] via SUBCUTANEOUS
  Filled 2016-10-12 (×2): qty 0.15

## 2016-10-12 MED ORDER — INSULIN ASPART 100 UNIT/ML ~~LOC~~ SOLN
3.0000 [IU] | Freq: Three times a day (TID) | SUBCUTANEOUS | Status: DC
  Administered 2016-10-13: 3 [IU] via SUBCUTANEOUS
  Filled 2016-10-12: qty 1

## 2016-10-12 MED ORDER — INSULIN ASPART 100 UNIT/ML ~~LOC~~ SOLN
0.0000 [IU] | SUBCUTANEOUS | Status: DC
  Administered 2016-10-13 (×2): 2 [IU] via SUBCUTANEOUS
  Administered 2016-10-13: 3 [IU] via SUBCUTANEOUS
  Filled 2016-10-12 (×3): qty 1

## 2016-10-12 MED ORDER — SODIUM CHLORIDE 0.9% FLUSH
10.0000 mL | Freq: Two times a day (BID) | INTRAVENOUS | Status: DC
  Administered 2016-10-12 – 2016-10-13 (×2): 10 mL

## 2016-10-12 MED ORDER — ORAL CARE MOUTH RINSE
15.0000 mL | Freq: Two times a day (BID) | OROMUCOSAL | Status: DC
  Administered 2016-10-12 (×2): 15 mL via OROMUCOSAL

## 2016-10-12 MED FILL — Medication: Qty: 2 | Status: AC

## 2016-10-12 NOTE — Progress Notes (Signed)
Inpatient Diabetes Program Recommendations  AACE/ADA: New Consensus Statement on Inpatient Glycemic Control (2015)  Target Ranges:  Prepandial:   less than 140 mg/dL      Peak postprandial:   less than 180 mg/dL (1-2 hours)      Critically ill patients:  140 - 180 mg/dL   Lab Results  Component Value Date   GLUCAP 95 10/12/2016    Review of Glycemic ControlResults for LISSANDRA, KEIL (MRN [id number]) as of 10/12/2016 09:40  Ref. Range 10/12/2016 05:37 10/12/2016 06:40 10/12/2016 07:37 10/12/2016 08:38  Glucose-Capillary Latest Ref Range: 65 - 99 mg/dL 164 (H) 150 (H) 118 (H) 95   Diabetes history: Type 1 diabetes Outpatient Diabetes medications: Insulin pump Current orders for Inpatient glycemic control:  IV insulin- Glucostabilizer  Inpatient Diabetes Program Recommendations:    Called and talked to Therapist, sports.  Asked her to have family bring in insulin pump so that we can determine hospital insulin needs.  Asked her to page me when family brings insulin pump.  Continue insulin drip until basal/bolus needs can be determined.  Thanks, Adah Perl, RN, BC-ADM Inpatient Diabetes Coordinator Pager [phone] (8a-5p)

## 2016-10-12 NOTE — Progress Notes (Signed)
Pt. Was suctioned for a small amount of thick white secretions. Per Dr. Lonell Grandchild order, she was extubated without incident. She was placed on a 2 L nasal cannula. She has a strong cough and is voicing.

## 2016-10-12 NOTE — Progress Notes (Signed)
*  PRELIMINARY RESULTS* Echocardiogram 2D Echocardiogram has been performed.  Lisa [name] 10/12/2016, 9:27 AM

## 2016-10-12 NOTE — Progress Notes (Addendum)
PULMONARY / CRITICAL CARE MEDICINE   Name: Lisa [name] MRN: [id number] DOB: February 08, 1950    ADMISSION DATE:  10/11/2016  PT PROFILE:   63 F with type I DM assented to ED 09/11 with left flank pain. CTAP revealed left ureteral stone and mild left hydronephrosis. Ceftriaxone was administered and very shortly thereafter she described a metallic taste in her mouth, then suffered cardiac arrest. She underwent prolonged CPR/ACLS (45 minutes). After resuscitation, she had findings on EKG worrisome for ischemia and therefore was taken for [facility]. Hypothermia protocol (36) was initiated.  MAJOR EVENTS/TEST RESULTS: 09/11 admission as above. Prolonged ACLS in ED. LHC performed. Hypothermia protocol initiated 09/11 CTAP: 4 mm distal left ureteral calculus with mild hydroureteronephrosis 09/11 cardiology consultation and LHC: Severe 2 vessel coronary artery disease, including heavily calcified 95% proximal/mid LCx and sequential 90-99% mid RCA stenoses. Trop I peak of 7.82 09/11 urology consultation: No need for urgent urologic intervention. Agree with Cipro to treat possible urinary tract infection. 10/12/16 echocardiogram: LVEF 55-60%. No significant abnormal findings 10/12/16: Passed SBT and extubated. Tolerating.  INDWELLING DEVICES:: ETT 09/11 >> 09/12 R femoral CVL 09/11 >>   MICRO DATA: MRSA PCR 09/11 >> negative Urine 09/11 >>  Blood 09/12 >>   ANTIMICROBIALS:  Ciprofloxacin 09/11 >>    SUBJECTIVE:  Cognition fully intact. [place] discontinued this morning. Passed SBT this a.m. and extubated. Tolerating well.   VITAL SIGNS: BP 114/61   Pulse 88   Temp 98 F (36.7 C) (Oral)   Resp 19   Ht 5\' 3"  (1.6 m)   Wt 67.6 kg (149 lb 0.5 oz)   SpO2 100%   BMI 26.40 kg/m   HEMODYNAMICS:    VENTILATOR SETTINGS: Vent Mode: PSV FiO2 (%):  [30 %-100 %] 30 % Set Rate:  [14 bmp] 14 bmp Vt Set:  [500 mL] 500 mL PEEP:  [5 cmH20] 5 cmH20 Pressure Support:  [10 cmH20] 10 cmH20 Plateau  Pressure:  [13 cmH20] 13 cmH20  INTAKE / OUTPUT: I/O last 3 completed shifts: In: 3362.4 [I.V.:2762.4; IV PIRJJOACZ:660] Out: 886 [Urine:886]  PHYSICAL EXAMINATION: General: NAD Neuro: Fully intact HEENT: NCAT, sclerae white Cardiovascular: Regular, no M Lungs: Clear without adventitious sounds Abdomen: Soft, NT, diminished BS Ext: Warm, no edema  LABS:  BMET  Recent Labs Lab 10/11/16 1237 10/12/16 0138 10/12/16 0455  NA 137 134* 135  K 3.2* 4.6 4.5  CL 105 109 107  CO2 16* 20* 21*  BUN 14 21* 22*  CREATININE 0.99 1.03* 1.03*  GLUCOSE 291* 246* 194*    Electrolytes  Recent Labs Lab 10/11/16 1237 10/11/16 1651 10/12/16 0138 10/12/16 0455  CALCIUM 7.8*  --  6.7* 6.7*  MG  --  1.8  --   --   PHOS  --  3.3  --   --     CBC  Recent Labs Lab 10/11/16 0711 10/12/16 0455 10/12/16 0653  WBC 13.5* 51.8* 51.3*  HGB 12.8 12.7 13.3  HCT 38.0 38.6 41.1  PLT 353 329 348    Coag's  Recent Labs Lab 10/11/16 1237  APTT 55*  INR 1.72    Sepsis Markers No results for input(s): LATICACIDVEN, PROCALCITON, O2SATVEN in the last 168 hours.  ABG  Recent Labs Lab 10/11/16 1530  PHART 7.23*  PCO2ART 36  PO2ART 123*    Liver Enzymes  Recent Labs Lab 10/11/16 0711  AST 22  ALT 21  ALKPHOS 80  BILITOT 0.6  ALBUMIN 3.8    Cardiac Enzymes  Recent Labs Lab 10/11/16 1651 10/11/16 2306 10/12/16 0455  TROPONINI 7.77* 5.68* 3.19*    Glucose  Recent Labs Lab 10/12/16 0838 10/12/16 0942 10/12/16 1030 10/12/16 1132 10/12/16 1228 10/12/16 1328  GLUCAP 95 96 97 118* 144* 151*    CXR: NNF   ASSESSMENT / PLAN:  CARDIOVASCULAR A:  Prolonged cardiac arrest - likely due to demand ischemia after anaphylaxis Severe CAD NSTEMI  P:  Continue aspirin and heparin infusion Cardiology recommends low dose beta blocker after vasopressors entirely weaned off Wean norepinephrine as tolerated for MAP > 65 mmHg  PULMONARY A: VDRF after cardiac  arrest, resolved Smoker P:   Supplemental oxygen as needed to keep SPO2 >92% Counseled regarding smoking cessation  RENAL A:   Left kidney stone Mild left hydronephrosis P:   Urology consultation 09/11 - no intervention planned Monitor BMET intermittently Monitor I/Os Correct electrolytes as indicated  IVS adjusted  GASTROINTESTINAL A:   No acute issues P:   SUP: N/I post extubation Advance diet as tolerated  HEMATOLOGIC A:   No acute issues P:  DVT px: Full dose heparin Monitor CBC intermittently Transfuse per usual guidelines   INFECTIOUS A:   L hydronephrosis Pyuria P:   Monitor temp, WBC count Micro and abx as above   ENDOCRINE A:   Type I DM - insulin pump as outpatient P:   Transition back to insulin pump per recommendations from DM coordinator  NEUROLOGIC A:   Anoxic encephalopathy - resolved P:   RASS goal: 0 Minimize sedation/analgesia   FAMILY: Patient and family updated at bedside     CCM time: 35 mins The above time includes time spent in consultation with patient and/or family members and reviewing care plan on multidisciplinary rounds  Merton Border, MD PCCM service Mobile [phone] Pager [phone]    10/12/2016, 2:16 PM

## 2016-10-12 NOTE — Progress Notes (Signed)
Urology Consult Follow Up  Subjective: Patient was seen twice a day, initially early this morning while still intubated but awake and pulling it to. She was seen later in the afternoon after extubation. She denies any ongoing flank pain. She reports some diffuse mild pain all over.  Anti-infectives: Anti-infectives    Start     Dose/Rate Route Frequency Ordered Stop   10/11/16 1130  ciprofloxacin (CIPRO) IVPB 400 mg     400 mg 200 mL/hr over 60 Minutes Intravenous Every 12 hours 10/11/16 1118     10/11/16 0930  cefTRIAXone (ROCEPHIN) 2 g in dextrose 5 % 50 mL IVPB     2 g 100 mL/hr over 30 Minutes Intravenous  Once 10/11/16 2992 10/11/16 0948      Current Facility-Administered Medications  Medication Dose Route Frequency Provider Last Rate Last Dose  . 0.9 %  sodium chloride infusion   Intravenous Continuous Wilhelmina Mcardle, MD   Stopped at 10/11/16 1401  . 0.9 %  sodium chloride infusion  250 mL Intravenous PRN End, Harrell Gave, MD      . aspirin chewable tablet 81 mg  81 mg Oral Daily End, Christopher, MD   81 mg at 10/11/16 1618  . atorvastatin (LIPITOR) tablet 80 mg  80 mg Oral q1800 End, Christopher, MD   80 mg at 10/11/16 1851  . ciprofloxacin (CIPRO) IVPB 400 mg  400 mg Intravenous Q12H Wilhelmina Mcardle, MD   Stopped at 10/12/16 0007  . dextrose 5 % in lactated ringers infusion   Intravenous Continuous Wilhelmina Mcardle, MD 50 mL/hr at 10/12/16 1000    . heparin ADULT infusion 100 units/mL (25000 units/272mL sodium chloride 0.45%)  800 Units/hr Intravenous Continuous Hallaji, Sheema M, RPH 8 mL/hr at 10/12/16 1000 800 Units/hr at 10/12/16 1000  . insulin regular (NOVOLIN R,HUMULIN R) 100 Units in sodium chloride 0.9 % 100 mL (1 Units/mL) infusion   Intravenous Continuous Laverle Hobby, MD 1 mL/hr at 10/12/16 1030 1 Units/hr at 10/12/16 1030  . MEDLINE mouth rinse  15 mL Mouth Rinse BID Wilhelmina Mcardle, MD   15 mL at 10/12/16 1000  . morphine 2 MG/ML injection 1-2 mg  1-2  mg Intravenous Q2H PRN Wilhelmina Mcardle, MD      . norepinephrine (LEVOPHED) 16 mg in dextrose 5 % 250 mL (0.064 mg/mL) infusion  0-40 mcg/min Intravenous Titrated Wilhelmina Mcardle, MD 3.8 mL/hr at 10/12/16 1030 4 mcg/min at 10/12/16 1030  . sodium chloride flush (NS) 0.9 % injection 3 mL  3 mL Intravenous Q12H End, [place], MD   3 mL at 10/12/16 1000  . sodium chloride flush (NS) 0.9 % injection 3 mL  3 mL Intravenous PRN End, Harrell Gave, MD         Objective: Vital signs in last 24 hours: Temp:  [93.9 F (34.4 C)-99.7 F (37.6 C)] 98.6 F (37 C) (09/12 0930) Pulse Rate:  [70-106] 86 (09/12 1030) Resp:  [9-26] 18 (09/12 1030) BP: (74-142)/(48-104) 108/65 (09/12 1030) SpO2:  [92 %-100 %] 92 % (09/12 1030) FiO2 (%):  [30 %-100 %] 30 % (09/12 0800) Weight:  [149 lb 0.5 oz (67.6 kg)] 149 lb 0.5 oz (67.6 kg) (09/11 1238)  Intake/Output from previous day: 09/11 0701 - 09/12 0700 In: 3362.4 [I.V.:2762.4; IV Piggyback:600] Out: 886 [Urine:886] Intake/Output this shift: Total I/O In: 269.9 [I.V.:269.9] Out: 40 [Urine:40]   Physical Exam  Constitutional: She is well-developed, well-nourished, and in no distress.  HENT:  Head: Normocephalic and  atraumatic.  Pulmonary/Chest: Breath sounds normal. No respiratory distress.  Abdominal: Soft. She exhibits no distension.  Neurological: She is alert.  Family at bedside.  Skin: Skin is warm and dry.  Vitals reviewed.   Lab Results:   Recent Labs  10/12/16 0455 10/12/16 0653  WBC 51.8* 51.3*  HGB 12.7 13.3  HCT 38.6 41.1  PLT 329 348   BMET  Recent Labs  10/12/16 0138 10/12/16 0455  NA 134* 135  K 4.6 4.5  CL 109 107  CO2 20* 21*  GLUCOSE 246* 194*  BUN 21* 22*  CREATININE 1.03* 1.03*  CALCIUM 6.7* 6.7*   PT/INR  Recent Labs  10/11/16 1237  LABPROT 20.0*  INR 1.72   ABG  Recent Labs  10/11/16 1530  PHART 7.23*  HCO3 14.7*    Studies/Results: Dg Chest 1 View  Result Date: 10/11/2016 CLINICAL  DATA:  ET and NG tube placement. EXAM: CHEST 1 VIEW COMPARISON:  Chest x-ray from same day. FINDINGS: Unchanged positioning of the endotracheal tube with the tip approximately 3.9 cm above the level of the carina. New enteric tube in place with the tip and distal side port in the gastric body. The cardiomediastinal silhouette is normal in size. No focal consolidation, pleural effusion, or pneumothorax. No acute osseous abnormality. IMPRESSION: 1. Interval placement of an enteric tube with the tip and distal side port in the gastric body. 2.  No active cardiopulmonary disease. Electronically Signed   By: Titus Dubin M.D.   On: 10/11/2016 13:32   Dg Chest 1 View  Result Date: 10/11/2016 CLINICAL DATA:  Post intubation, former smoker, diabetes mellitus EXAM: CHEST 1 VIEW COMPARISON:  Portable exam 1039 hours without priors for comparison FINDINGS: Tip of endotracheal tube projects 4.8 cm above carina. External pacing leads project over chest. Normal heart size, mediastinal contours, and pulmonary vascularity. Lungs clear. No pleural effusion or pneumothorax. Bones demineralized. IMPRESSION: No acute abnormalities. Electronically Signed   By: Lavonia Dana M.D.   On: 10/11/2016 11:52   Ct Renal Stone Study  Result Date: 10/11/2016 CLINICAL DATA:  Left flank pain.  History of renal stones. EXAM: CT ABDOMEN AND PELVIS WITHOUT CONTRAST TECHNIQUE: Multidetector CT imaging of the abdomen and pelvis was performed following the standard protocol without IV contrast. COMPARISON:  None. FINDINGS: Lower chest: Minimal atelectasis in the lung bases. No pleural effusion. Three-vessel coronary artery atherosclerosis. Normal heart size. No pericardial effusion. Hepatobiliary: No focal liver abnormality is seen. No gallstones, gallbladder wall thickening, or biliary dilatation. Pancreas: Mildly truncated appearance of the pancreatic tail. No ductal dilatation or surrounding inflammatory changes. Spleen: Unremarkable.  Adrenals/Urinary Tract: Unremarkable adrenal glands. Punctate nonobstructing calculus in the lower pole of the right kidney. Three punctate nonobstructing left renal calculi. 4 mm obstructing calculus in the distal left ureter near the UVJ resulting in mild hydroureteronephrosis. Mild left perinephric stranding. Unremarkable bladder. Stomach/Bowel: Small sliding hiatal hernia. No evidence of bowel obstruction or inflammation. Unremarkable appendix. Vascular/Lymphatic: Extensive atherosclerosis of the abdominal aorta with mild infrarenal aortic ectasia measuring up to 2.8 cm diameter. Central displacement of intimal calcification more distally in the aorta over a length of 2.5 cm suggests a short segment dissection. No enlarged lymph nodes. Reproductive: 9 mm calcification in the uterine fundus likely reflecting a small fibroid. Unremarkable ovaries. Other: No intraperitoneal free fluid. Small fat containing umbilical hernia. Musculoskeletal: Mild L4 and L5 superior endplate compression fractures, chronic in appearance. Mild spondylosis and moderate posterior element hypertrophy in the lower lumbar spine with  likely mild spinal stenosis at L3-4 and L4-5. IMPRESSION: 1. 4 mm distal left ureteral calculus with mild hydroureteronephrosis. 2. Punctate nonobstructing bilateral renal calculi. 3. Small hiatal hernia. 4. Aortic Atherosclerosis (ICD10-I70.0). Mild infrarenal aortic ectasia and suspected short segment aortic dissection. Electronically Signed   By: Logan Bores M.D.   On: 10/11/2016 08:20     Assessment/ Plan: 66 year old female with a 4 mm left distal ureteral calculus with mild hydroureteronephrosis, UTI admitted to the ICU after anaphylaxis/cardiac arrest with prolonged resuscitation secondary to ceftriaxone administration.  Currently remains on pressors for cardiogenic shock secondary to an STEMI.  Now extubated and mentating well.  1. Left ureteral stone/UTI/leukocytosis- Continue Cipro UCx  pending No further left flank pain, may have passed the stone ?? Renal ultrasound to reassess If stone retained, timing of intervention difficult as she still on pressors after major cardiac event and high risk for anesthesia/surgery at this time Given her need for heparin, placement of percutaneous nephrostomy tube also difficult At this point in time, she remains afebrile and is clinically improving- we'll continue to follow conservatively for the time being If spikes fever or clinical status and hemodynamics worsen, consider emergent ureteral stent placement   Urology will continue to follow closely   LOS: 1 day   Hollice Espy 10/12/2016

## 2016-10-12 NOTE — Progress Notes (Signed)
Inpatient Diabetes Program Recommendations  AACE/ADA: New Consensus Statement on Inpatient Glycemic Control (2015)  Target Ranges:  Prepandial:   less than 140 mg/dL      Peak postprandial:   less than 180 mg/dL (1-2 hours)      Critically ill patients:  140 - 180 mg/dL   Lab Results  Component Value Date   GLUCAP 152 (H) 10/12/2016    Review of Glycemic Control  Results for SIREEN, HALK (MRN [id number]) as of 10/12/2016 15:52  Ref. Range 10/12/2016 11:32 10/12/2016 12:28 10/12/2016 13:28 10/12/2016 14:32 10/12/2016 15:35  Glucose-Capillary Latest Ref Range: 65 - 99 mg/dL 118 (H) 144 (H) 151 (H) 195 (H) 152 (H)   Assessed insulin pump settings.  Patient has medtronic insulin pump. Settings are: Basal: 12 AM-0.525 units/hr              3 AM- 0.625 units/hr              5 AM-0.95 units/hr              8AM-0.575 units/hr              2 PM- 0.625 units/hr              8 PM- 0.725 units/hr Total Basal=15.775 units/24 hours Bolus:  1 unit/12 grams of CHO and 1 unit/14 grams of CHO              1 unit drops CBG approximately 50 mg/dL  Talked with patient and husband about resuming insulin pump.  Patient states that she would prefer to NOT have insulin pump on at this time because her head feels a little fuzzy.  Explained that we could use insulin pump settings to determine basal/bolus regimen in the hospital and she agreed.  Discussed with NP, Hinton Dyer and orders received.  Will follow.  Thanks, Adah Perl, RN, BC-ADM Inpatient Diabetes Coordinator Pager [phone] (8a-5p)

## 2016-10-12 NOTE — Progress Notes (Signed)
ANTICOAGULATION CONSULT NOTE - Initial Consult  Pharmacy Consult for Heparin Drip  Indication: chest pain/ACS  Allergies  Allergen Reactions  . Ceftriaxone     Anaphylaxis, cardiac arrest    Patient Measurements: Height: 5\' 3"  (160 cm) Weight: 149 lb 0.5 oz (67.6 kg) IBW/kg (Calculated) : 52.4 Vital Signs: Temp: 98 F (36.7 C) (09/12 1200) Temp Source: Oral (09/12 1200) BP: 114/61 (09/12 1400) Pulse Rate: 88 (09/12 1400)  Labs:  Recent Labs  10/11/16 0711  10/11/16 1237 10/11/16 1651 10/11/16 2306 10/12/16 0059 10/12/16 0138 10/12/16 0455 10/12/16 0653 10/12/16 1319  HGB 12.8  --   --   --   --   --   --  12.7 13.3  --   HCT 38.0  --   --   --   --   --   --  38.6 41.1  --   PLT 353  --   --   --   --   --   --  329 348  --   APTT  --   --  55*  --   --   --   --   --   --   --   LABPROT  --   --  20.0*  --   --   --   --   --   --   --   INR  --   --  1.72  --   --   --   --   --   --   --   HEPARINUNFRC  --   --   --   --   --  0.55  --   --  0.51 0.51  CREATININE 0.88  --  0.99  --   --   --  1.03* 1.03*  --   --   TROPONINI  --   < > 7.82* 7.77* 5.68*  --   --  3.19*  --   --   < > = values in this interval not displayed.  Estimated Creatinine Clearance: 51 mL/min (A) (by C-G formula based on SCr of 1.03 mg/dL (H)).   Medical History: Past Medical History:  Diagnosis Date  . Diabetes mellitus without complication [facility])    Assessment: Pharmacy consulted for heparin drip dosing and monitoring in 66 yo female for ACS admitted with ureteral stone and went in to cardiac arrest s/p CTX administration. Patient found to have severe 2 vessel disease and will need PCI.   Goal of Therapy:  Heparin level 0.3-0.7 units/ml Monitor platelets by anticoagulation protocol: Yes   Plan:  Will continue heparin drip at 800 units/hr and f/u AM labs.   Napoleon Form, PharmD, BCPS Clinical Pharmacist 10/12/2016 2:37 PM
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
## 2016-10-12 NOTE — Progress Notes (Signed)
ANTICOAGULATION CONSULT NOTE - Initial Consult  Pharmacy Consult for Heparin Drip  Indication: chest pain/ACS  Allergies  Allergen Reactions  . Ceftriaxone     Anaphylaxis, cardiac arrest    Patient Measurements: Height: 5\' 3"  (160 cm) Weight: 149 lb 0.5 oz (67.6 kg) IBW/kg (Calculated) : 52.4 Vital Signs: Temp: 98.6 F (37 C) (09/12 0800) Temp Source: Rectal (09/12 0800) BP: 91/58 (09/12 0800) Pulse Rate: 84 (09/12 0800)  Labs:  Recent Labs  10/11/16 0711  10/11/16 1237 10/11/16 1651 10/11/16 2306 10/12/16 0059 10/12/16 0138 10/12/16 0455 10/12/16 0653  HGB 12.8  --   --   --   --   --   --  12.7 13.3  HCT 38.0  --   --   --   --   --   --  38.6 41.1  PLT 353  --   --   --   --   --   --  329 348  APTT  --   --  55*  --   --   --   --   --   --   LABPROT  --   --  20.0*  --   --   --   --   --   --   INR  --   --  1.72  --   --   --   --   --   --   HEPARINUNFRC  --   --   --   --   --  0.55  --   --  0.51  CREATININE 0.88  --  0.99  --   --   --  1.03* 1.03*  --   TROPONINI  --   < > 7.82* 7.77* 5.68*  --   --  3.19*  --   < > = values in this interval not displayed.  Estimated Creatinine Clearance: 51 mL/min (A) (by C-G formula based on SCr of 1.03 mg/dL (H)).   Medical History: Past Medical History:  Diagnosis Date  . Diabetes mellitus without complication [facility])    Assessment: Pharmacy consulted for heparin drip dosing and monitoring in 66 yo female for ACS admitted with ureteral stone and went in to cardiac arrest s/p CTX administration.   Goal of Therapy:  Heparin level 0.3-0.7 units/ml Monitor platelets by anticoagulation protocol: Yes   Plan:  Continue heparin drip at current rate and recheck a HL in 6 hours.   Napoleon Form, PharmD, BCPS Clinical Pharmacist 10/12/2016 8:27 AM

## 2016-10-12 NOTE — Progress Notes (Signed)
ICU NP, discontinued orders for cooling pads to be on for normothermia regulation. Emptied pads of arctic sun. Will closely monitor patients temp with rectal and temp foley probe.

## 2016-10-12 NOTE — Progress Notes (Signed)
Notified ICU NP of low urine output. Received new order. See [facility]

## 2016-10-12 NOTE — Progress Notes (Signed)
Patient extubated by RT at 0855 to [facility].  Will continue to monitor.

## 2016-10-12 NOTE — Progress Notes (Signed)
Nutrition Brief Note  Patient identified on the Malnutrition Screening Tool (MST) Report  Wt Readings from Last 15 Encounters:  10/11/16 149 lb 0.5 oz (67.6 kg)   Spoke with patient and family members at bedside. She reports her appetite was good PTA and she ate well. She and her husband follow a carbohydrate modified diet in setting of her diabetes. She has no questions about her diet. Patient repots her UBW is 135 lbs. She believes she has been weight stable. Limited weight history in chart, but current body weight is 149 lbs. May be slightly elevated with fluid boluses given, but likely related to use of different scale than patient uses at home. Patient with no nutrition questions or concerns.  Limited Nutrition-Focused physical exam completed. Findings are no fat depletion, no muscle depletion.  Patient does not meet criteria for malnutrition at this time.  Discussed with RN.  Body mass index is 26.4 kg/m. Patient meets criteria for overweight based on current BMI.   Awaiting diet advancement at this time. Labs and medications reviewed.   No nutrition interventions warranted at this time. If nutrition issues arise, please consult RD.   Willey Blade, MS, RD, LDN Pager: [phone] After Hours Pager: [phone]

## 2016-10-13 ENCOUNTER — Inpatient Hospital Stay (HOSPITAL_COMMUNITY)
Admission: AD | Admit: 2016-10-13 | Discharge: 2016-10-21 | DRG: 280 | Disposition: A | Discharge status: Home Health | Payer: Managed Care, Other (non HMO) | Source: Other Acute Inpatient Hospital | Attending: Cardiology | Admitting: Cardiology

## 2016-10-13 ENCOUNTER — Inpatient Hospital Stay (HOSPITAL_COMMUNITY): Payer: Managed Care, Other (non HMO)

## 2016-10-13 ENCOUNTER — Encounter (HOSPITAL_COMMUNITY): Payer: Self-pay | Admitting: Physician Assistant

## 2016-10-13 ENCOUNTER — Inpatient Hospital Stay: Payer: Managed Care, Other (non HMO)

## 2016-10-13 DIAGNOSIS — I700 Atherosclerosis of aorta: Secondary | ICD-10-CM | POA: Diagnosis present

## 2016-10-13 DIAGNOSIS — J9811 Atelectasis: Secondary | ICD-10-CM | POA: Diagnosis present

## 2016-10-13 DIAGNOSIS — Z716 Tobacco abuse counseling: Secondary | ICD-10-CM

## 2016-10-13 DIAGNOSIS — M7981 Nontraumatic hematoma of soft tissue: Secondary | ICD-10-CM | POA: Diagnosis not present

## 2016-10-13 DIAGNOSIS — K7200 Acute and subacute hepatic failure without coma: Secondary | ICD-10-CM | POA: Diagnosis present

## 2016-10-13 DIAGNOSIS — I4901 Ventricular fibrillation: Principal | ICD-10-CM | POA: Diagnosis present

## 2016-10-13 DIAGNOSIS — T361X5A Adverse effect of cephalosporins and other beta-lactam antibiotics, initial encounter: Secondary | ICD-10-CM | POA: Diagnosis present

## 2016-10-13 DIAGNOSIS — Y92230 Patient room in hospital as the place of occurrence of the external cause: Secondary | ICD-10-CM | POA: Diagnosis not present

## 2016-10-13 DIAGNOSIS — T782XXA Anaphylactic shock, unspecified, initial encounter: Secondary | ICD-10-CM | POA: Diagnosis not present

## 2016-10-13 DIAGNOSIS — E8770 Fluid overload, unspecified: Secondary | ICD-10-CM | POA: Diagnosis present

## 2016-10-13 DIAGNOSIS — E1065 Type 1 diabetes mellitus with hyperglycemia: Secondary | ICD-10-CM | POA: Diagnosis present

## 2016-10-13 DIAGNOSIS — N179 Acute kidney failure, unspecified: Secondary | ICD-10-CM | POA: Diagnosis present

## 2016-10-13 DIAGNOSIS — Z8249 Family history of ischemic heart disease and other diseases of the circulatory system: Secondary | ICD-10-CM

## 2016-10-13 DIAGNOSIS — Z9641 Presence of insulin pump (external) (internal): Secondary | ICD-10-CM | POA: Diagnosis present

## 2016-10-13 DIAGNOSIS — T782XXS Anaphylactic shock, unspecified, sequela: Secondary | ICD-10-CM

## 2016-10-13 DIAGNOSIS — J441 Chronic obstructive pulmonary disease with (acute) exacerbation: Secondary | ICD-10-CM | POA: Diagnosis present

## 2016-10-13 DIAGNOSIS — J9601 Acute respiratory failure with hypoxia: Secondary | ICD-10-CM

## 2016-10-13 DIAGNOSIS — I480 Paroxysmal atrial fibrillation: Secondary | ICD-10-CM | POA: Diagnosis present

## 2016-10-13 DIAGNOSIS — I469 Cardiac arrest, cause unspecified: Secondary | ICD-10-CM | POA: Diagnosis not present

## 2016-10-13 DIAGNOSIS — Z87892 Personal history of anaphylaxis: Secondary | ICD-10-CM | POA: Diagnosis present

## 2016-10-13 DIAGNOSIS — N201 Calculus of ureter: Secondary | ICD-10-CM | POA: Diagnosis present

## 2016-10-13 DIAGNOSIS — N200 Calculus of kidney: Secondary | ICD-10-CM

## 2016-10-13 DIAGNOSIS — E871 Hypo-osmolality and hyponatremia: Secondary | ICD-10-CM

## 2016-10-13 DIAGNOSIS — J440 Chronic obstructive pulmonary disease with acute lower respiratory infection: Secondary | ICD-10-CM | POA: Diagnosis present

## 2016-10-13 DIAGNOSIS — N136 Pyonephrosis: Secondary | ICD-10-CM | POA: Diagnosis present

## 2016-10-13 DIAGNOSIS — N390 Urinary tract infection, site not specified: Secondary | ICD-10-CM | POA: Diagnosis not present

## 2016-10-13 DIAGNOSIS — J189 Pneumonia, unspecified organism: Secondary | ICD-10-CM | POA: Diagnosis present

## 2016-10-13 DIAGNOSIS — X58XXXA Exposure to other specified factors, initial encounter: Secondary | ICD-10-CM | POA: Diagnosis not present

## 2016-10-13 DIAGNOSIS — E1051 Type 1 diabetes mellitus with diabetic peripheral angiopathy without gangrene: Secondary | ICD-10-CM | POA: Diagnosis present

## 2016-10-13 DIAGNOSIS — I442 Atrioventricular block, complete: Secondary | ICD-10-CM | POA: Diagnosis present

## 2016-10-13 DIAGNOSIS — R0602 Shortness of breath: Secondary | ICD-10-CM

## 2016-10-13 DIAGNOSIS — D6489 Other specified anemias: Secondary | ICD-10-CM | POA: Diagnosis not present

## 2016-10-13 DIAGNOSIS — B961 Klebsiella pneumoniae [K. pneumoniae] as the cause of diseases classified elsewhere: Secondary | ICD-10-CM | POA: Diagnosis present

## 2016-10-13 DIAGNOSIS — J90 Pleural effusion, not elsewhere classified: Secondary | ICD-10-CM | POA: Diagnosis present

## 2016-10-13 DIAGNOSIS — Z79899 Other long term (current) drug therapy: Secondary | ICD-10-CM

## 2016-10-13 DIAGNOSIS — R570 Cardiogenic shock: Secondary | ICD-10-CM | POA: Diagnosis present

## 2016-10-13 DIAGNOSIS — B9620 Unspecified Escherichia coli [E. coli] as the cause of diseases classified elsewhere: Secondary | ICD-10-CM | POA: Diagnosis present

## 2016-10-13 DIAGNOSIS — Z881 Allergy status to other antibiotic agents status: Secondary | ICD-10-CM

## 2016-10-13 DIAGNOSIS — N3001 Acute cystitis with hematuria: Secondary | ICD-10-CM | POA: Diagnosis not present

## 2016-10-13 DIAGNOSIS — Z7982 Long term (current) use of aspirin: Secondary | ICD-10-CM

## 2016-10-13 DIAGNOSIS — J9600 Acute respiratory failure, unspecified whether with hypoxia or hypercapnia: Secondary | ICD-10-CM

## 2016-10-13 DIAGNOSIS — F17210 Nicotine dependence, cigarettes, uncomplicated: Secondary | ICD-10-CM | POA: Diagnosis present

## 2016-10-13 DIAGNOSIS — D72829 Elevated white blood cell count, unspecified: Secondary | ICD-10-CM | POA: Diagnosis not present

## 2016-10-13 DIAGNOSIS — I214 Non-ST elevation (NSTEMI) myocardial infarction: Secondary | ICD-10-CM | POA: Diagnosis present

## 2016-10-13 DIAGNOSIS — E876 Hypokalemia: Secondary | ICD-10-CM | POA: Diagnosis present

## 2016-10-13 DIAGNOSIS — J9690 Respiratory failure, unspecified, unspecified whether with hypoxia or hypercapnia: Secondary | ICD-10-CM | POA: Diagnosis present

## 2016-10-13 DIAGNOSIS — Z794 Long term (current) use of insulin: Secondary | ICD-10-CM

## 2016-10-13 DIAGNOSIS — Z8679 Personal history of other diseases of the circulatory system: Secondary | ICD-10-CM | POA: Diagnosis present

## 2016-10-13 DIAGNOSIS — R069 Unspecified abnormalities of breathing: Secondary | ICD-10-CM

## 2016-10-13 DIAGNOSIS — I2510 Atherosclerotic heart disease of native coronary artery without angina pectoris: Secondary | ICD-10-CM | POA: Diagnosis present

## 2016-10-13 DIAGNOSIS — F419 Anxiety disorder, unspecified: Secondary | ICD-10-CM | POA: Diagnosis present

## 2016-10-13 DIAGNOSIS — J811 Chronic pulmonary edema: Secondary | ICD-10-CM | POA: Diagnosis present

## 2016-10-13 DIAGNOSIS — S1093XA Contusion of unspecified part of neck, initial encounter: Secondary | ICD-10-CM | POA: Diagnosis not present

## 2016-10-13 HISTORY — DX: Acute and subacute hepatic failure without coma: K72.00

## 2016-10-13 HISTORY — DX: Pneumonia, unspecified organism: J18.9

## 2016-10-13 LAB — CBC
HEMATOCRIT: 31.4 % — AB (ref 35.0–47.0)
HEMOGLOBIN: 10.7 g/dL — AB (ref 12.0–16.0)
MCH: 28.2 pg (ref 26.0–34.0)
MCHC: 34 g/dL (ref 32.0–36.0)
MCV: 83.1 fL (ref 80.0–100.0)
Platelets: 241 10*3/uL (ref 150–440)
RBC: 3.78 MIL/uL — ABNORMAL LOW (ref 3.80–5.20)
RDW: 13.9 % (ref 11.5–14.5)
WBC: 28.3 10*3/uL — ABNORMAL HIGH (ref 3.6–11.0)

## 2016-10-13 LAB — URINALYSIS, ROUTINE W REFLEX MICROSCOPIC
BILIRUBIN URINE: NEGATIVE
Glucose, UA: NEGATIVE mg/dL
Ketones, ur: 5 mg/dL — AB
LEUKOCYTES UA: NEGATIVE
Nitrite: NEGATIVE
Protein, ur: NEGATIVE mg/dL
SPECIFIC GRAVITY, URINE: 1.006 (ref 1.005–1.030)
pH: 5 (ref 5.0–8.0)

## 2016-10-13 LAB — LIPID PANEL
Cholesterol: 81 mg/dL (ref 0–200)
HDL: 34 mg/dL — ABNORMAL LOW (ref >40)
LDL CALC: 36 mg/dL (ref 0–99)
TRIGLYCERIDES: 56 mg/dL (ref <150)
Total CHOL/HDL Ratio: 2.4 RATIO
VLDL: 11 mg/dL (ref 0–40)

## 2016-10-13 LAB — MAGNESIUM
MAGNESIUM: 1.6 mg/dL — AB (ref 1.7–2.4)
Magnesium: 1.1 mg/dL — ABNORMAL LOW (ref 1.7–2.4)
Magnesium: 2.3 mg/dL (ref 1.7–2.4)

## 2016-10-13 LAB — BASIC METABOLIC PANEL
Anion gap: 9 (ref 5–15)
BUN: 18 mg/dL (ref 6–20)
CO2: 20 mmol/L — AB (ref 22–32)
Calcium: 6.4 mg/dL — CL (ref 8.9–10.3)
Chloride: 94 mmol/L — ABNORMAL LOW (ref 101–111)
Creatinine, Ser: 0.7 mg/dL (ref 0.44–1.00)
GFR calc Af Amer: >60 mL/min (ref >60)
GFR calc non Af Amer: >60 mL/min (ref >60)
GLUCOSE: 240 mg/dL — AB (ref 65–99)
POTASSIUM: 3.4 mmol/L — AB (ref 3.5–5.1)
Sodium: 123 mmol/L — ABNORMAL LOW (ref 135–145)

## 2016-10-13 LAB — GLUCOSE, CAPILLARY
GLUCOSE-CAPILLARY: 197 mg/dL — AB (ref 65–99)
GLUCOSE-CAPILLARY: 245 mg/dL — AB (ref 65–99)
GLUCOSE-CAPILLARY: 263 mg/dL — AB (ref 65–99)
GLUCOSE-CAPILLARY: 273 mg/dL — AB (ref 65–99)
Glucose-Capillary: 236 mg/dL — ABNORMAL HIGH (ref 65–99)
Glucose-Capillary: 195 mg/dL — ABNORMAL HIGH (ref 65–99)
Glucose-Capillary: 223 mg/dL — ABNORMAL HIGH (ref 65–99)

## 2016-10-13 LAB — COMPREHENSIVE METABOLIC PANEL
ALBUMIN: 2.6 g/dL — AB (ref 3.5–5.0)
ALK PHOS: 71 U/L (ref 38–126)
ALT: 64 U/L — AB (ref 14–54)
ANION GAP: 8 (ref 5–15)
AST: 133 U/L — ABNORMAL HIGH (ref 15–41)
BILIRUBIN TOTAL: 0.9 mg/dL (ref 0.3–1.2)
BUN: 20 mg/dL (ref 6–20)
CALCIUM: 6.6 mg/dL — AB (ref 8.9–10.3)
CO2: 21 mmol/L — ABNORMAL LOW (ref 22–32)
CREATININE: 0.8 mg/dL (ref 0.44–1.00)
Chloride: 96 mmol/L — ABNORMAL LOW (ref 101–111)
GFR calc Af Amer: >60 mL/min (ref >60)
GFR calc non Af Amer: >60 mL/min (ref >60)
GLUCOSE: 252 mg/dL — AB (ref 65–99)
Potassium: 3.7 mmol/L (ref 3.5–5.1)
Sodium: 125 mmol/L — ABNORMAL LOW (ref 135–145)
TOTAL PROTEIN: 5.3 g/dL — AB (ref 6.5–8.1)

## 2016-10-13 LAB — HEPARIN LEVEL (UNFRACTIONATED)
HEPARIN UNFRACTIONATED: 0.39 [IU]/mL (ref 0.30–0.70)
Heparin Unfractionated: 0.25 IU/mL — ABNORMAL LOW (ref 0.30–0.70)

## 2016-10-13 LAB — SODIUM, URINE, RANDOM: SODIUM UR: 77 mmol/L

## 2016-10-13 LAB — HEMOGLOBIN A1C
HEMOGLOBIN A1C: 7 % — AB (ref 4.8–5.6)
MEAN PLASMA GLUCOSE: 154.2 mg/dL

## 2016-10-13 LAB — OSMOLALITY, URINE: OSMOLALITY UR: 313 mosm/kg (ref 300–900)

## 2016-10-13 LAB — PROCALCITONIN: Procalcitonin: 20.66 ng/mL

## 2016-10-13 LAB — OSMOLALITY: Osmolality: 260 mOsm/kg — ABNORMAL LOW (ref 275–295)

## 2016-10-13 LAB — BRAIN NATRIURETIC PEPTIDE: B Natriuretic Peptide: 217.2 pg/mL — ABNORMAL HIGH (ref 0.0–100.0)

## 2016-10-13 MED ORDER — ALPRAZOLAM 0.25 MG PO TABS: 0.2500 mg | ORAL_TABLET | Freq: Every day | ORAL | Status: DC

## 2016-10-13 MED ORDER — MORPHINE SULFATE (PF) 4 MG/ML IV SOLN
2.0000 mg | INTRAVENOUS | Status: DC | PRN
  Administered 2016-10-13 – 2016-10-15 (×8): 2 mg via INTRAVENOUS
  Filled 2016-10-13 (×9): qty 1

## 2016-10-13 MED ORDER — IPRATROPIUM-ALBUTEROL 0.5-2.5 (3) MG/3ML IN SOLN
3.0000 mL | Freq: Four times a day (QID) | RESPIRATORY_TRACT | Status: DC
  Administered 2016-10-13 – 2016-10-17 (×16): 3 mL via RESPIRATORY_TRACT
  Filled 2016-10-13 (×16): qty 3

## 2016-10-13 MED ORDER — INSULIN ASPART 100 UNIT/ML ~~LOC~~ SOLN
3.0000 [IU] | Freq: Three times a day (TID) | SUBCUTANEOUS | Status: DC
  Administered 2016-10-15 (×3): 3 [IU] via SUBCUTANEOUS

## 2016-10-13 MED ORDER — ALPRAZOLAM 0.25 MG PO TABS
0.2500 mg | ORAL_TABLET | Freq: Every day | ORAL | Status: DC
  Administered 2016-10-13: 0.25 mg via ORAL
  Filled 2016-10-13 (×2): qty 1

## 2016-10-13 MED ORDER — ATORVASTATIN CALCIUM 80 MG PO TABS: 80.0000 mg | ORAL_TABLET | Freq: Every day | ORAL | 0 refills | Status: DC

## 2016-10-13 MED ORDER — NITROGLYCERIN 0.4 MG SL SUBL: 0.4000 mg | SUBLINGUAL_TABLET | SUBLINGUAL | Status: DC | PRN

## 2016-10-13 MED ORDER — INSULIN ASPART 100 UNIT/ML ~~LOC~~ SOLN: 3.0000 [IU] | Freq: Three times a day (TID) | SUBCUTANEOUS | 11 refills | Status: DC

## 2016-10-13 MED ORDER — INSULIN GLARGINE 100 UNIT/ML ~~LOC~~ SOLN: 15.0000 [IU] | Freq: Every day | SUBCUTANEOUS | Status: DC

## 2016-10-13 MED ORDER — POTASSIUM CHLORIDE CRYS ER 20 MEQ PO TBCR
40.0000 meq | EXTENDED_RELEASE_TABLET | Freq: Once | ORAL | Status: AC
  Administered 2016-10-13: 40 meq via ORAL
  Filled 2016-10-13: qty 2

## 2016-10-13 MED ORDER — CARVEDILOL 6.25 MG PO TABS: 6.2500 mg | ORAL_TABLET | Freq: Two times a day (BID) | ORAL | Status: DC

## 2016-10-13 MED ORDER — ORAL CARE MOUTH RINSE: 15.0000 mL | Freq: Two times a day (BID) | OROMUCOSAL | Status: DC

## 2016-10-13 MED ORDER — INSULIN ASPART 100 UNIT/ML ~~LOC~~ SOLN: 3.0000 [IU] | Freq: Three times a day (TID) | SUBCUTANEOUS | Status: DC

## 2016-10-13 MED ORDER — ATORVASTATIN CALCIUM 80 MG PO TABS
80.0000 mg | ORAL_TABLET | Freq: Every day | ORAL | Status: DC
  Administered 2016-10-13 – 2016-10-20 (×8): 80 mg via ORAL
  Filled 2016-10-13 (×8): qty 1

## 2016-10-13 MED ORDER — HEPARIN BOLUS VIA INFUSION
1000.0000 [IU] | Freq: Once | INTRAVENOUS | Status: AC
  Administered 2016-10-13: 1000 [IU] via INTRAVENOUS
  Filled 2016-10-13: qty 1000

## 2016-10-13 MED ORDER — MAGNESIUM SULFATE 2 GM/50ML IV SOLN
2.0000 g | Freq: Once | INTRAVENOUS | Status: AC
  Administered 2016-10-13: 2 g via INTRAVENOUS
  Filled 2016-10-13: qty 50

## 2016-10-13 MED ORDER — INSULIN GLARGINE 100 UNIT/ML ~~LOC~~ SOLN: 15.0000 [IU] | Freq: Every day | SUBCUTANEOUS | 11 refills | Status: DC

## 2016-10-13 MED ORDER — INSULIN ASPART 100 UNIT/ML ~~LOC~~ SOLN
0.0000 [IU] | Freq: Three times a day (TID) | SUBCUTANEOUS | Status: DC
  Administered 2016-10-14: 3 [IU] via SUBCUTANEOUS
  Administered 2016-10-14: 5 [IU] via SUBCUTANEOUS
  Administered 2016-10-14: 3 [IU] via SUBCUTANEOUS
  Administered 2016-10-15: 5 [IU] via SUBCUTANEOUS
  Administered 2016-10-15 (×2): 3 [IU] via SUBCUTANEOUS

## 2016-10-13 MED ORDER — ALPRAZOLAM 0.25 MG PO TABS
0.2500 mg | ORAL_TABLET | Freq: Three times a day (TID) | ORAL | Status: DC | PRN
  Administered 2016-10-13 – 2016-10-14 (×2): 0.25 mg via ORAL
  Filled 2016-10-13: qty 1

## 2016-10-13 MED ORDER — SODIUM CHLORIDE 0.9 % IV SOLN
1.0000 g | Freq: Once | INTRAVENOUS | Status: AC
  Administered 2016-10-13: 1 g via INTRAVENOUS
  Filled 2016-10-13: qty 10

## 2016-10-13 MED ORDER — INSULIN GLARGINE 100 UNIT/ML ~~LOC~~ SOLN
18.0000 [IU] | Freq: Every day | SUBCUTANEOUS | Status: DC
  Administered 2016-10-14 – 2016-10-15 (×2): 18 [IU] via SUBCUTANEOUS
  Filled 2016-10-13 (×3): qty 0.18

## 2016-10-13 MED ORDER — FUROSEMIDE 10 MG/ML IJ SOLN
40.0000 mg | Freq: Once | INTRAMUSCULAR | Status: AC
  Administered 2016-10-13: 40 mg via INTRAVENOUS
  Filled 2016-10-13: qty 4

## 2016-10-13 MED ORDER — ACETAMINOPHEN 325 MG PO TABS
650.0000 mg | ORAL_TABLET | ORAL | Status: DC | PRN
  Administered 2016-10-16: 650 mg via ORAL
  Filled 2016-10-13: qty 2

## 2016-10-13 MED ORDER — ONDANSETRON HCL 4 MG/2ML IJ SOLN: 4.0000 mg | Freq: Four times a day (QID) | INTRAMUSCULAR | Status: DC | PRN

## 2016-10-13 MED ORDER — CIPROFLOXACIN HCL 500 MG PO TABS
500.0000 mg | ORAL_TABLET | Freq: Two times a day (BID) | ORAL | Status: AC
  Administered 2016-10-13 – 2016-10-17 (×10): 500 mg via ORAL
  Filled 2016-10-13 (×10): qty 1

## 2016-10-13 MED ORDER — IPRATROPIUM-ALBUTEROL 0.5-2.5 (3) MG/3ML IN SOLN
3.0000 mL | RESPIRATORY_TRACT | Status: DC | PRN
  Administered 2016-10-13: 3 mL via RESPIRATORY_TRACT
  Filled 2016-10-13: qty 3

## 2016-10-13 MED ORDER — CIPROFLOXACIN IN D5W 400 MG/200ML IV SOLN: 400.0000 mg | Freq: Two times a day (BID) | INTRAVENOUS | 0 refills | Status: DC

## 2016-10-13 MED ORDER — ASPIRIN EC 81 MG PO TBEC
81.0000 mg | DELAYED_RELEASE_TABLET | Freq: Every day | ORAL | Status: DC
  Administered 2016-10-14 – 2016-10-21 (×8): 81 mg via ORAL
  Filled 2016-10-13 (×8): qty 1

## 2016-10-13 MED ORDER — ALPRAZOLAM 0.25 MG PO TABS
0.2500 mg | ORAL_TABLET | Freq: Every day | ORAL | Status: DC
  Administered 2016-10-14 – 2016-10-21 (×8): 0.25 mg via ORAL
  Filled 2016-10-13 (×9): qty 1

## 2016-10-13 MED ORDER — INSULIN ASPART 100 UNIT/ML ~~LOC~~ SOLN: 5.0000 [IU] | SUBCUTANEOUS | Status: DC

## 2016-10-13 MED ORDER — IPRATROPIUM-ALBUTEROL 0.5-2.5 (3) MG/3ML IN SOLN: 3.0000 mL | RESPIRATORY_TRACT | 0 refills | Status: DC | PRN

## 2016-10-13 MED ORDER — HEPARIN (PORCINE) IN NACL 100-0.45 UNIT/ML-% IJ SOLN
1250.0000 [IU]/h | INTRAMUSCULAR | Status: DC
  Administered 2016-10-14: 1050 [IU]/h via INTRAVENOUS
  Administered 2016-10-15: 1250 [IU]/h via INTRAVENOUS
  Filled 2016-10-13 (×2): qty 250

## 2016-10-13 MED ORDER — HEPARIN (PORCINE) IN NACL 100-0.45 UNIT/ML-% IJ SOLN: 950.0000 [IU]/h | INTRAMUSCULAR | 0 refills | Status: DC

## 2016-10-13 NOTE — Progress Notes (Signed)
Pt. Having increasing SOB, O2 Sats btwn 87-91%, BUL have crackles. Called Ms. Varughese, NP who came bedside to assess pt.   NP ordered lasix and nebs PRN. Pt. Titrated from 2 L [place] to 5 L [place] to maintain O2 Sats > 90%.  Will continue to monitor pt. Closely.

## 2016-10-13 NOTE — Progress Notes (Signed)
ANTICOAGULATION CONSULT NOTE - Initial Consult  Pharmacy Consult for Heparin Drip  Indication: chest pain/ACS  Allergies  Allergen Reactions  . Ceftriaxone     Anaphylaxis, cardiac arrest    Patient Measurements: Height: 5\' 3"  (160 cm) Weight: 149 lb 0.5 oz (67.6 kg) IBW/kg (Calculated) : 52.4 Vital Signs: Temp: 97.9 F (36.6 C) (09/13 0400) Temp Source: Oral (09/13 0400) BP: 133/61 (09/13 0500) Pulse Rate: 108 (09/13 0500)  Labs:  Recent Labs  10/11/16 1237 10/11/16 1651 10/11/16 2306  10/12/16 0138 10/12/16 0455 10/12/16 0653 10/12/16 1319 10/13/16 0452 10/13/16 0518  HGB  --   --   --   --   --  12.7 13.3  --  10.7*  --   HCT  --   --   --   --   --  38.6 41.1  --  31.4*  --   PLT  --   --   --   --   --  329 348  --  241  --   APTT 55*  --   --   --   --   --   --   --   --   --   LABPROT 20.0*  --   --   --   --   --   --   --   --   --   INR 1.72  --   --   --   --   --   --   --   --   --   HEPARINUNFRC  --   --   --   < >  --   --  0.51 0.51  --  0.25*  CREATININE 0.99  --   --   --  1.03* 1.03*  --   --   --   --   TROPONINI 7.82* 7.77* 5.68*  --   --  3.19*  --   --   --   --   < > = values in this interval not displayed.  Estimated Creatinine Clearance: 51 mL/min (A) (by C-G formula based on SCr of 1.03 mg/dL (H)).   Medical History: Past Medical History:  Diagnosis Date  . CAD (coronary artery disease)    a. LHC 10/11/2016: LM 20%, oLAD 40%, p-mLAD 40% eccentric/calcified, dLAD 40%, p-mLCx 95%, eccentric, sev calcified, pRCA 30%, eccentric, sev calcified, mRCA lesion-1 90% focal & eccentric, sev calcified, mRCA lesion-2 99% sev calcified, EF 55-65%  . Nephrolithiasis   . NSTEMI (non-ST elevated myocardial infarction) ([place]) 10/11/2016  . PVD (peripheral vascular disease) ([place])    a. Abdominal aortogram 10/11/16: Infrarenal abdominal aorta heavily calcified & ectatic with approximately 40-50% stenosis at the bifurcation. Mild diffuse disease w/ heavy  calcification noted in the common & external iliac arteries bilaterally  . Type I diabetes mellitus ([place])   . Ventricular fibrillation ([place]) 10/11/2016   a. Felt to be secondary to demand ischemia in the setting of underlying CAD secondary to anaphylactic reaction from IV Rocephin   Assessment: Pharmacy consulted for heparin drip dosing and monitoring in 66 yo female for ACS admitted with ureteral stone and went in to cardiac arrest s/p CTX administration. Patient found to have severe 2 vessel disease and will need PCI.   Goal of Therapy:  Heparin level 0.3-0.7 units/ml Monitor platelets by anticoagulation protocol: Yes   Plan:  Will continue heparin drip at 800 units/hr and f/u AM labs.   9/13 @ 0518 HL  0.25 subtherapeutic. Will rebolus w/ heparin 1000 units IV x 1 and increase rate to 950 units/hr. Will recheck HL @ 1200. Hgb trending down.  Tobie Lords, PharmD, BCPS Clinical Pharmacist 10/13/2016 6:14 AM

## 2016-10-13 NOTE — Progress Notes (Signed)
Report called to Lanelle Bal, RN on 2 heart at [facility].  [facility] at bedside getting patient ready for transport to [facility].

## 2016-10-13 NOTE — Progress Notes (Signed)
Pt.'s CBG elevated at 231, recheck showed 245, sliding scale was only 2 units of insulin.  Called and spoke with Ms. Varughese, NP to alert her of change. Discussed that the Diabetes Coordinator was working on getting the pt.'s insulin pump for 9/13 AM. Instructed to go by current sliding scale orders. Will continue to monitor pt. Closely.

## 2016-10-13 NOTE — Progress Notes (Signed)
Inpatient Diabetes Program Recommendations  AACE/ADA: New Consensus Statement on Inpatient Glycemic Control (2015)  Target Ranges:  Prepandial:   less than 140 mg/dL      Peak postprandial:   less than 180 mg/dL (1-2 hours)      Critically ill patients:  140 - 180 mg/dL   Results for TAREA, SKILLMAN (MRN [id number]) as of 10/13/2016 08:46  Ref. Range 10/12/2016 15:35 10/12/2016 16:30 10/12/2016 17:33 10/12/2016 18:32 10/12/2016 19:43 10/12/2016 21:14 10/12/2016 23:50 10/12/2016 23:56 10/13/2016 03:52 10/13/2016 07:58  Glucose-Capillary Latest Ref Range: 65 - 99 mg/dL 152 (H) 146 (H) 124 (H) 121 (H) 144 (H) 171 (H) 231 (H) 245 (H) 236 (H) 263 (H)   Review of Glycemic Control  Diabetes history: DM1 Outpatient Diabetes medications: Medtronic Insulin pump with Humalog Current orders for Inpatient glycemic control: Lantus 15 units daily, Novolog 0-5 units Q4H, Novolog 3 units TID with meals for meal coverage  Inpatient Diabetes Program Recommendations: Insulin - Basal: Please consider increasing Lantus to 18 units daily. Correction (SSI): If patient is eating well, please consider changing frequency of CBGs and Novolog to ACHS and ordering Novolog 0-9 units TID with meals and Novolog 0-5 units QHS. Insulin - Meal Coverage: Please consider increasing meal coverage to Novolog 5 units TID with meals if patient eats at least 50% of meals.  Thanks, Barnie Alderman, RN, MSN, CDE Diabetes Coordinator Inpatient Diabetes Program [phone] (Team Pager from 8am to 5pm)

## 2016-10-13 NOTE — Progress Notes (Signed)
Inpatient Diabetes Program Recommendations  AACE/ADA: New Consensus Statement on Inpatient Glycemic Control (2015)  Target Ranges:  Prepandial:   less than 140 mg/dL      Peak postprandial:   less than 180 mg/dL (1-2 hours)      Critically ill patients:  140 - 180 mg/dL   Lab Results  Component Value Date   GLUCAP 195 (H) 10/13/2016    Review of Glycemic Control  Results for Lisa [name], Lisa [name] (MRN [id number]) as of 10/13/2016 13:04  Ref. Range 10/12/2016 23:56 10/13/2016 03:52 10/13/2016 07:58 10/13/2016 09:34 10/13/2016 11:46  Glucose-Capillary Latest Ref Range: 65 - 99 mg/dL 245 (H) 236 (H) 263 (H) 273 (H) 195 (H)   Diabetes history: DM1 Outpatient Diabetes medications: Assessed insulin pump settings.  Patient has medtronic insulin pump. Settings are: Basal: 12 AM-0.525 units/hr              3 AM- 0.625 units/hr              5 AM-0.95 units/hr              8AM-0.575 units/hr              2 PM- 0.625 units/hr              8 PM- 0.725 units/hr Total Basal=15.775 units/24 hours  Bolus:  1 unit/12 grams of CHO and 1 unit/14 grams of CHO              1 unit drops CBG approximately 50 mg/dL  Current orders for Inpatient glycemic control: Lantus 15 units daily, Novolog 0-5 units Q4H, Novolog 3 units TID with meals for meal coverage  Inpatient Diabetes Program Recommendations: Please consider increasing Lantus to 18 units daily.  Consider adding Novolog 0-5 units QHS.  Please consider increasing meal coverage to Novolog 5 units TID with meals if patient eats at least 50% of meals.  Gentry Fitz, RN, BA, MHA, CDE Diabetes Coordinator Inpatient Diabetes Program  [phone] (Team Pager) [phone] ([place]) 10/13/2016 1:16 PM

## 2016-10-13 NOTE — Progress Notes (Signed)
Patient left via stretcher with [facility] transferring to [facility].  Alert with no distress noted when leaving ICU.

## 2016-10-13 NOTE — Progress Notes (Signed)
Patient Name: Lisa [name] Date of Encounter: 10/13/2016  Primary Cardiologist: New to [facility] - consult by End  Hospital Problem List     Principal Problem:   Cardiac arrest with ventricular fibrillation ([place]) Active Problems:   Non-ST elevation (NSTEMI) myocardial infarction ([place])   Respiratory failure ([place])   Cardiogenic shock ([place])   Anaphylaxis   Renal stone   Leukocytosis   AKI (acute kidney injury) ([place]   Left ureteral stone     Subjective   Had an episode overnight with increased SB requiring 5 L via [place]. Given IV Lasix with brisk UOP. CXR on 9/13 showed new onset of diffuse bilateral pulmonary interstitial pneumonitis vs edema with a small right pleural effusion. Now on 2 L via [place]. No further SOB. Chest sore from compressions.   Extubated on the morning of 9/12, on room air without issues. Magnesium low this morning at 1.1, Potassium low at 3.7, SCr improved to 0.80 from 1.03, sodium low at 125, albumin 2.6, AST 133, ALT 64, leukocytosis improving to 28.3 from 51.3, hgb 10.7 from 13.3, PLT count stable/normal. TTE on 9/12 showed preserved EF of 55-60% with possible mild HK of the inferior wall. Remains on heparin gtt. Levophed stopped on 9/12 with BP improved into the 229N systolic.   Inpatient Medications    Scheduled Meds: . ALPRAZolam  0.25 mg Oral Daily  . aspirin  81 mg Oral Daily  . atorvastatin  80 mg Oral q1800  . insulin aspart  0-5 Units Subcutaneous Q4H  . insulin aspart  3 Units Subcutaneous TID WC  . insulin glargine  15 Units Subcutaneous Daily  . mouth rinse  15 mL Mouth Rinse BID  . sodium chloride flush  10-40 mL Intracatheter Q12H  . sodium chloride flush  3 mL Intravenous Q12H   Continuous Infusions: . sodium chloride Stopped (10/11/16 1401)  . sodium chloride    . ciprofloxacin Stopped (10/12/16 2332)  . heparin 950 Units/hr (10/13/16 9892)  . norepinephrine (LEVOPHED) Adult infusion Stopped (10/12/16 1130)   PRN Meds: sodium chloride,  ibuprofen, ipratropium-albuterol, morphine injection, sodium chloride flush, sodium chloride flush   Vital Signs    Vitals:   10/13/16 0300 10/13/16 0400 10/13/16 0500 10/13/16 0600  BP: (!) 141/72 138/61 133/61 131/61  Pulse: (!) 111 (!) 113 (!) 108 (!) 109  Resp: (!) 31 (!) 25 (!) 30 (!) 31  Temp:  97.9 F (36.6 C)    TempSrc:  Oral    SpO2: 91% 92% 92% 92%  Weight:      Height:        Intake/Output Summary (Last 24 hours) at 10/13/16 0736 Last data filed at 10/13/16 0600  Gross per 24 hour  Intake          4567.77 ml  Output             2945 ml  Net          1622.77 ml   Filed Weights   10/11/16 0625 10/11/16 1238  Weight: 135 lb (61.2 kg) 149 lb 0.5 oz (67.6 kg)    Physical Exam    GEN: Well nourished, well developed, in no acute distress.  HEENT: Grossly normal.  Neck: Supple, no JVD, carotid bruits, or masses. Cardiac: RRR, no murmurs, rubs, or gallops. No clubbing, cyanosis, edema.  Radials/DP/PT 2+ and equal bilaterally.  Respiratory:  Diminished breath sounds bilaterally with bibasilar crackles and expiratory wheezing. GI: Soft, nontender, nondistended, BS + x 4. MS:  no deformity or atrophy. Skin: warm and dry, no rash. Neuro:  Strength and sensation are intact. Psych: AAOx3.  Normal affect.  Labs    CBC  Recent Labs  10/11/16 0711  10/12/16 0653 10/13/16 0452  WBC 13.5*  < > 51.3* 28.3*  NEUTROABS 11.5*  --  46.8*  --   HGB 12.8  < > 13.3 10.7*  HCT 38.0  < > 41.1 31.4*  MCV 82.3  < > 85.1 83.1  PLT 353  < > 348 241  < > = values in this interval not displayed. Basic Metabolic Panel  Recent Labs  10/11/16 1651  10/12/16 0455 10/13/16 0452  NA  --   < > 135 125*  K  --   < > 4.5 3.7  CL  --   < > 107 96*  CO2  --   < > 21* 21*  GLUCOSE  --   < > 194* 252*  BUN  --   < > 22* 20  CREATININE  --   < > 1.03* 0.80  CALCIUM  --   < > 6.7* 6.6*  MG 1.8  --   --  1.1*  PHOS 3.3  --   --   --   < > = values in this interval not  displayed. Liver Function Tests  Recent Labs  10/11/16 0711 10/13/16 0452  AST 22 133*  ALT 21 64*  ALKPHOS 80 71  BILITOT 0.6 0.9  PROT 7.5 5.3*  ALBUMIN 3.8 2.6*    Recent Labs  10/11/16 0711  LIPASE 18   Cardiac Enzymes  Recent Labs  10/11/16 1651 10/11/16 2306 10/12/16 0455  TROPONINI 7.77* 5.68* 3.19*   BNP Invalid input(s): POCBNP D-Dimer No results for input(s): DDIMER in the last 72 hours. Hemoglobin A1C No results for input(s): HGBA1C in the last 72 hours. Fasting Lipid Panel  Recent Labs  10/13/16 0452  CHOL 81  HDL 34*  LDLCALC 36  TRIG 56  CHOLHDL 2.4   Thyroid Function Tests  Recent Labs  10/12/16 1319  TSH 1.154    Telemetry    Sinus rhythm, 90s bpm to sinus tachycardia 110s bpm - Personally Reviewed  ECG    n/a - Personally Reviewed  Radiology    Dg Chest 1 View  Result Date: 10/11/2016 IMPRESSION: 1. Interval placement of an enteric tube with the tip and distal side port in the gastric body. 2.  No active cardiopulmonary disease. Electronically Signed   By: Titus Dubin M.D.   On: 10/11/2016 13:32   Dg Chest 1 View  Result Date: 10/11/2016 IMPRESSION: No acute abnormalities. Electronically Signed   By: Lavonia Dana M.D.   On: 10/11/2016 11:52   Dg Chest Port 1 View  Result Date: 10/13/2016 IMPRESSION: 1.  Interim removal of endotracheal tube and NG tube. 2. New onset of diffuse bilateral pulmonary interstitial prominence consistent with a process such as interstitial pneumonitis or interstitial edema. Small right pleural effusion. Electronically Signed   By: Marcello Moores  Register   On: 10/13/2016 06:19   Ct Renal Stone Study  Result Date: 10/11/2016 IMPRESSION: 1. 4 mm distal left ureteral calculus with mild hydroureteronephrosis. 2. Punctate nonobstructing bilateral renal calculi. 3. Small hiatal hernia. 4. Aortic Atherosclerosis (ICD10-I70.0). Mild infrarenal aortic ectasia and suspected short segment aortic dissection.  Electronically Signed   By: Logan Bores M.D.   On: 10/11/2016 08:20    Cardiac Studies   LHC 10/11/2016: Conclusion   Conclusions: 1. Severe  2 vessel coronary artery disease, including heavily calcified 95% proximal/mid LCx and sequential 90-99% mid RCA stenoses. 2. Mild to moderate, nonobstructive disease involving the LMCA and LAD. 3. Overall preserved LV contraction with basal inferior hypokinesis. 4. Upper normal to mildly elevated left ventricular filling pressure (LVEDP 15-20 mmHg). 5. Peripheral vascular disease with ectasia and calcification of the infrarenal abdominal aorta and iliac arteries.  Recommendations: 1. I suspect patient's cardiac arrest was due to demand ischemia in the setting of anaphylactic shock from allergic reaction to ceftriaxone. She has TIMI-3 flow in all major epicardial coronary arteries. I recommend medical therapy and treatment of the underlying infection. If she has a meaningful neurologic recovery, atherectomy/PCI to the RCA and LCx will need to be considered. 2. Obtain transthoracic echocardiogram to confirm LVEF and evaluate for other structural abnormalities. 3. I would like to avoid intra-aortic balloon pump placement if possible, given aortic disease. If hypotension becomes a problem, judicious vasopressor support is recommended. 4. Low-dose aspirin and high intensity statin therapy. Consider heparin infusion (ACS nomogram) if no invasive procedures are necessary (i.e. percutaneous nephrostomy tube). 5. Consider addition of low-dose beta blocker if heart rate and blood pressure allow. If significant ventricular ectopy is observed, amiodarone infusion should be considered.    Coronary Findings   Dominance: Right  Left Main  Vessel is large.  LM lesion, 20% stenosed.  Left Anterior Descending  Vessel is moderate in size. There is mild the vessel.  Ost LAD lesion, 40% stenosed.  Prox LAD to Mid LAD lesion, 40% stenosed. The lesion is eccentric.  The lesion is calcified.  Dist LAD lesion, 40% stenosed.  Second Diagonal Branch  Vessel is small in size.  Third Diagonal Branch  Vessel is moderate in size.  Ramus Intermedius  Vessel is small.  Left Circumflex  Vessel is moderate in size.  Prox Cx to Mid Cx lesion, 95% stenosed. The lesion is eccentric. The lesion is severely calcified.  First Obtuse Marginal Branch  Vessel is small in size.  Second Obtuse Marginal Branch  Vessel is moderate in size.  2nd Mrg lesion, 25% stenosed.  Third Obtuse Marginal Branch  Vessel is moderate in size.  Right Coronary Artery  Vessel is large.  Prox RCA lesion, 30% stenosed. The lesion is eccentric. The lesion is severely calcified.  Mid RCA-1 lesion, 90% stenosed. The lesion is focal and eccentric. The lesion is severely calcified.  Mid RCA-2 lesion, 99% stenosed. The lesion is severely calcified.  Right Posterior Descending Artery  Vessel is moderate in size.  Right Posterior Atrioventricular Branch  Vessel is moderate in size.    Diagnostic Diagram        Abdominal aortogram 10/11/2016: Vascular Findings   Abdominal Aorta The infrarenal abdominal aorta is heavily calcified and ectatic with approximately 40-50% stenosis at the bifurcation. Mild diffuse disease with heavy calcification is noted in the common and external iliac arteries bilaterally.       TTE 10/12/2016: Study Conclusions  - Left ventricle: The cavity size was normal. Systolic function was   normal. The estimated ejection fraction was in the range of 55%   to 60%. Wall motion was grossly normal; Unable to exclude mild   hypokinesis of the inferior wall. Left ventricular diastolic   function parameters were normal. - Left atrium: The atrium was normal in size. - Right ventricle: Systolic function was normal. - Pulmonary arteries: Systolic pressure was within the normal   range.  Patient Profile     66 y.o.  female with history of type I diabetes who  presented to [facility] ED on 9/11 with flank pain and was found to have renal stone. Immediately following IV Rocephin, she suffered a Vfib arrest/NSTEMI felt to be 2/2 demand ischemia from underlying CAD in the setting of anaphylaxis from Rocephin. Patient apparently had a similar reaction to Rocephin in PCP office several months ago in which she suffered a syncopal episode following IM Rocephin (rhythm unknown at that time). Emergent LHC showed severe 2 vessel CAD as detailed above.  Assessment & Plan    1. Vfib arrest/NSTEMI/cardiogenic shock/respiratory failure/CAD/anoxic encephalopathy: -Likely in the setting of demand ischemia with underlying CAD as above noted on emergent LHC 2/2 anaphylaxis from Rocephin -Pressures improved off sedation, off Levophed as of 9/12 with stable BP -Continue heparin gtt given no planned urological interventions at this time -Add Coreg 6.25 mg bid, when able given desaturation with coughing and wheezing on exam -Consider EP evaluation for possible ICD prior to discharge given VF arrest -TTE showed preserved EF with mild HK of the inferior wall -Maintain potassium > 4.0 and magnesium > 2.0 (has received IV potassium and magnesium)  -Check TSH -Extubated on 9/12, on 2 L via [place], though had an episode overnight with increased SB requiring 5 L via [employer]. Given IV Lasix with brisk UOP. CXR on 9/13 showed new onset of diffuse bilateral pulmonary interstitial pneumonitis vs edema with a small right pleural effusion. Remains on 2 L via [place] -She did drink a lot of fluids overnight -Will start IV Lasix 20 mg bid with KCl repletion pending recheck sodium level  -Case was discussed between Drs. Rockey Situ and Arida, who recommend transfer to [facility] for evaluation of possible CABG vs high-risk atherectomy  -Will plan to transfer 9/13, [facility] called, IM and PCCM notified  -[facility] protocol completed  -ASA -EEG normal  2. Anaphylaxis: -Felt to be 2/2 Rocephin -She had a similar  response to Rocephin at PCP office several months prior, though rhythm unknown at that time -Rocephin has been added as an allergy, would avoid medication and all similar medications given robust physiologic response   3. Left renal stone/leukocytosis: -On Cipro -Leukocytosis improving  -Urology following, planning for outpatient follow up  4. AKI: -Improved  5. Hypomagnesemia: -Recheck level this morning to confirm given numerous lab abnormalities  -Replete via IV to a goal > 2.0 -Replete potassium to goal > 4.0  6. Hyponatremia: -Repeat this AM -Would avoid fluids given new onset of diffuse bilateral pulmonary edema -Will give IV Lasix given volume overload on CXR with close monitoring of sodium level   7. Shock liver: -Possibly in the setting of #1 -Trend  8. Anemia: -Likely dilutional  -Monitor -No signs of bleeding  9. Type I diabetes: -Insulin pump at home -Lantus and SSI per PCCM -Diabetes coordinator    Signed, Christell Faith, PA-C [place] Pager: [phone] 10/13/2016, 7:36 AM

## 2016-10-13 NOTE — Progress Notes (Signed)
[place] at [place] NAME: Lisa [name]    MR#:  [id number]  DATE OF BIRTH:  09/12/1950  SUBJECTIVE:  CHIEF COMPLAINT:   Chief Complaint  Patient presents with  . Flank Pain    Had cardiac arrest after anaphylactic reaction- today more alert and extubated. Completely alert and oriented, no defecit. REVIEW OF SYSTEMS:  CONSTITUTIONAL: No fever, fatigue or weakness.  EYES: No blurred or double vision.  EARS, NOSE, AND THROAT: No tinnitus or ear pain.  RESPIRATORY: No cough, shortness of breath, wheezing or hemoptysis.  CARDIOVASCULAR: No chest pain, orthopnea, edema.  GASTROINTESTINAL: No nausea, vomiting, diarrhea or abdominal pain.  GENITOURINARY: No dysuria, hematuria.  ENDOCRINE: No polyuria, nocturia,  HEMATOLOGY: No anemia, easy bruising or bleeding SKIN: No rash or lesion. MUSCULOSKELETAL: No joint pain or arthritis.   NEUROLOGIC: No tingling, numbness, weakness.  PSYCHIATRY: No anxiety or depression.   ROS  DRUG ALLERGIES:   Allergies  Allergen Reactions  . Ceftriaxone     Anaphylaxis, cardiac arrest    VITALS:  Blood pressure 137/64, pulse (!) 108, temperature 98.6 F (37 C), temperature source Oral, resp. rate (!) 29, height 5\' 3"  (1.6 m), weight 67.6 kg (149 lb 0.5 oz), SpO2 93 %.  PHYSICAL EXAMINATION:  GENERAL:  66 y.o.-year-old patient lying in the bed with no acute distress.  EYES: Pupils equal, round, reactive to light and accommodation. No scleral icterus. Extraocular muscles intact.  HEENT: Head atraumatic, normocephalic. Oropharynx and nasopharynx clear.  NECK:  Supple, no jugular venous distention. No thyroid enlargement, no tenderness.  LUNGS: Normal breath sounds bilaterally, no wheezing, rales,rhonchi or crepitation. No use of accessory muscles of respiration.  CARDIOVASCULAR: S1, S2 normal. No murmurs, rubs, or gallops.  ABDOMEN: Soft, nontender, nondistended. Bowel sounds present. No organomegaly or  mass.  EXTREMITIES: No pedal edema, cyanosis, or clubbing.  NEUROLOGIC: Cranial nerves II through XII are intact. Muscle strength 5/5 in all extremities. Sensation intact. Gait not checked.  PSYCHIATRIC: The patient is alert and oriented x 3.  SKIN: No obvious rash, lesion, or ulcer.   Physical Exam LABORATORY PANEL:   CBC  Recent Labs Lab 10/13/16 0452  WBC 28.3*  HGB 10.7*  HCT 31.4*  PLT 241   ------------------------------------------------------------------------------------------------------------------  Chemistries   Recent Labs Lab 10/13/16 0452 10/13/16 0829  NA 125* 123*  K 3.7 3.4*  CL 96* 94*  CO2 21* 20*  GLUCOSE 252* 240*  BUN 20 18  CREATININE 0.80 0.70  CALCIUM 6.6* 6.4*  MG 1.1*  --   AST 133*  --   ALT 64*  --   ALKPHOS 71  --   BILITOT 0.9  --    ------------------------------------------------------------------------------------------------------------------  Cardiac Enzymes  Recent Labs Lab 10/11/16 2306 10/12/16 0455  TROPONINI 5.68* 3.19*   ------------------------------------------------------------------------------------------------------------------  RADIOLOGY:  Dg Chest 1 View  Result Date: 10/11/2016 CLINICAL DATA:  ET and NG tube placement. EXAM: CHEST 1 VIEW COMPARISON:  Chest x-ray from same day. FINDINGS: Unchanged positioning of the endotracheal tube with the tip approximately 3.9 cm above the level of the carina. New enteric tube in place with the tip and distal side port in the gastric body. The cardiomediastinal silhouette is normal in size. No focal consolidation, pleural effusion, or pneumothorax. No acute osseous abnormality. IMPRESSION: 1. Interval placement of an enteric tube with the tip and distal side port in the gastric body. 2.  No active cardiopulmonary disease. Electronically Signed   By: Titus Dubin  M.D.   On: 10/11/2016 13:32   Dg Chest 1 View  Result Date: 10/11/2016 CLINICAL DATA:  Post intubation,  former smoker, diabetes mellitus EXAM: CHEST 1 VIEW COMPARISON:  Portable exam 1039 hours without priors for comparison FINDINGS: Tip of endotracheal tube projects 4.8 cm above carina. External pacing leads project over chest. Normal heart size, mediastinal contours, and pulmonary vascularity. Lungs clear. No pleural effusion or pneumothorax. Bones demineralized. IMPRESSION: No acute abnormalities. Electronically Signed   By: Lavonia Dana M.D.   On: 10/11/2016 11:52   Dg Chest Port 1 View  Result Date: 10/13/2016 CLINICAL DATA:  Respiratory failure. EXAM: PORTABLE CHEST 1 VIEW COMPARISON:  10/11/2016. FINDINGS: Interim removal of endotracheal tube and NG tube. Heart size normal. New onset of diffuse bilateral pulmonary interstitial prominence noted. Findings consistent with a process such as interstitial pneumonitis or interstitial edema. Small right pleural effusion. No pneumothorax . IMPRESSION: 1.  Interim removal of endotracheal tube and NG tube. 2. New onset of diffuse bilateral pulmonary interstitial prominence consistent with a process such as interstitial pneumonitis or interstitial edema. Small right pleural effusion. Electronically Signed   By: [place]   On: 10/13/2016 06:19    ASSESSMENT AND PLAN:   Principal Problem:   Cardiac arrest with ventricular fibrillation (HCC) Active Problems:   Non-ST elevation (NSTEMI) myocardial infarction [facility])   Renal stone   Leukocytosis   AKI (acute kidney injury) ([place])   Respiratory failure ([place])   Cardiogenic shock (HCC)   Anaphylaxis   Left ureteral stone   * status post cardiac arrest and resuscitation   VF   Hypothermia protocol   Likely secondary to anaphylactic reaction to ceftriaxone.  intubated on ventilatory support, further management per ICU team. Extubated- 10/12/16, alert and oriented.  * elevated troponin   Cardiac catheterization was done and noted double vessel disease,  Interventions will be at  [facility].    Heparin IV drip and aspirin for now.   Echocardiogram.  * cardiogenic shock   On Levophed drip, off now.  * UTI   Ureteral stone   Ciprofloxacillin IV for now.   No further needs by urology.    All the records are reviewed and case discussed with Care Management/Social Workerr. Management plans discussed with the patient, family and they are in agreement.  CODE STATUS: full.  TOTAL TIME TAKING CARE OF THIS PATIENT: 35 minutes.     POSSIBLE D/C IN 1-2 DAYS, DEPENDING ON CLINICAL CONDITION.   Vaughan Basta M.D on 10/13/2016   Between 7am to 6pm - Pager - [phone]  After 6pm go to [email or web address] - password [email or web address] [place] [facility]  Office  [phone]  CC: Primary care physician; System, Pcp Not In  Note: This dictation was prepared with Dragon dictation along with smaller phrase technology. Any transcriptional errors that result from this process are unintentional.

## 2016-10-13 NOTE — H&P (Signed)
History and Physical  Patient ID: Lisa [name] MRN: [id number], DOB: Oct 15, 1950 Admit Date: (Not on file) Date of Encounter: 10/13/2016, 8:38 AM Primary Physician: System, Pcp Not In Primary Cardiologist: Dr. Saunders Revel, MD  Chief Complaint: Flank pain Reason for Admission: Vfib arrest  HPI: 66 y.o. female with h/o type I DM with insulin pump who presented to [facility] on 9/11 with flank pain and was found to have nephrolithiasis with possible UTI. Immediately after receiving IV Rocephin in the ED, patient started with seizure-like activity and suffered Vfib arrest. Cardiology urgently consulted at [facility].    No prior known cardiac history. Patient presented to the ED with flank pain with nausea and bladder pressure x 1 day. Never had pain like this before. She woke her husband up on the morning of 9/11 stating they needed to go to the hospital for evaluation. Never with chest pain, palpitations, SOB, diaphoresis, vomiting, presyncope, or syncope. Upon her arrival to [facility], vital signs were stable. She underwent CT renal stone protocol that showed a 4 mm distal left ureteral calculus with mild hydroureteronephrosis. Initial labs showed WBC 13.5, SCr 0.88, K+ 4.6. IV Rocephin was started at 9:45 AM with patient immediately reporting she could not breath. SHe became red and started to have seizure-like activity. She lost control of bowel and urine function, then lost pulse and stopped breathing. CPR was started at 9:50 AM. She received 6 amps of epi, 1 amp of atropine, she was intubated with 125 mg Solumedrol being given, along with etomidate and succ, 50 mg of Benadryl. She was shocked for Vfib x 4, 300 mg IV amiodarone was pushed, 2 gm magnesium hung, femoral pulse noted at 10:14 AM, heart rate down into the 40s bpm with femoral pulse, CHB noted followed by PEA with restarting of CPR. Code STEMI called at 10:30 AM. Femoral pulse regained at 10:30 AM with heart rates in the 40s bpm. Patient lost pulse at 10:33 AM  with CPR restarted with [facility] device. Cardiology arrived at bedside. Regained pulse at 10:34 AM with transcutaneous pacing started at 10:34. ICU MD consulted. IV magnesium and potassium were given. Patient stabilized and taken emergently to the cath lab that showed severe 2-vessel CAD, including heavily calcified 95% stenosis of the proximal/mid LCx and sequential 90-99% mid RCA stenoses. Mild to moderate, nonobstructive disease involving the left main and LAD. Overall, preserved LV systolic function with basal inferior hypokinesis, upper normal to mildly elevated LV filling pressures, and PVF with ectasia and calcification of the infrarenal abdominal aorta and iliac arteries. It was felt her cardiac arrest was due to demand ischemia in the setting of anaphylactic shock from allergic reaction to ceftriaxone. She was noted to have TIMI-3 flow in all major epicardial coronary arteries. At that time, the recommendation of medical management with treatment of the underlying infection was advised with consideration of atherectomy/PCI to the RCA and left circumflex if the patient was noted to have meaningful neurologic recovery. She was started on ICU cooling protocol. She required vasopressor support with Levaquin 5 overnight into the morning of 9/12 to maintain a map greater than 65 mmHg. There were no plans per urology for any urologic invasive procedures. She was started on heparin infusion and given aspirin. Initial troponin noted to be 7.8, down trending. EEG on 9/11 showed normal sleep brain waves with no epileptiform activity noted. Transthoracic echocardiogram on 9/12 showed EF 55-60, grossly normal wall motion, unable to exclude mild hypokinesis of the inferior wall. Left ventricular diastolic  function parameters were normal. Left atrium was normal in size. RV systolic function was normal. PASP normal. The patient was successfully extubated on the morning of 9/12 and transition to nasal cannula at 2 L.    Extubated on the morning of 9/12, to 2 L via [place]. Did well on 9/12, though was on numerous IV gtts and was drinking large amounts of water. Had an episode overnight with increased SB requiring 5 L via [place]. Given IV Lasix with brisk UOP of 2.5 L overnight and tapering of [place] back to 2 L. CXR on 9/13 showed new onset of diffuse bilateral pulmonary interstitial pneumonitis vs edema with a small right pleural effusion. Now on 2 L via [place]. SOB stable. Chest sore from compressions. Recheck labs pending given multiple abnormalities. TTE on 9/12 showed preserved EF of 55-60% with possible mild HK of the inferior wall. Remains on heparin gtt. Levophed stopped on 9/12 with BP improved into the 235T systolic. Case was discussed among Drs. Gollan and Arida with recommendation to transfer to [facility] for possible cardiac bypass evaluation.    Significant labs:  Troponin 7.82--> 7.77--> 5.68--> 3.19  Magnesium 1.8-->1.1 on 9/13 (stat recheck pending) Potassium 4.6--> 3.2--> 4.6--> 4.5-->3.7 on 9/13 (recheck pending) Phosphorus 3.3  Sodium 134-->137-->134-->135-->125 on 9/13 (recheck pending) WBC 13.5--> 51.8--> 51.3-->28.3 Hemoglobin 12.8--> 12.7--> 13.3-->10.7 Platelet 353--> 329--> 348-->241 Serum creatinine 0.88--> 0.99--> 1.03--> 1.03-->0.80 TSH 1.154  Lipase 18  Blood cultures pending 2  Urine culture pending ID Albumin 3.8-->2.6 AST 22-->133 ALT 64-->21 LDL 36   Past Medical History:  Diagnosis Date  . CAD (coronary artery disease)    a. LHC 10/11/2016: LM 20%, oLAD 40%, p-mLAD 40% eccentric/calcified, dLAD 40%, p-mLCx 95%, eccentric, sev calcified, pRCA 30%, eccentric, sev calcified, mRCA lesion-1 90% focal & eccentric, sev calcified, mRCA lesion-2 99% sev calcified, EF 55-65%  . Nephrolithiasis   . NSTEMI (non-ST elevated myocardial infarction) ([place]) 10/11/2016  . PVD (peripheral vascular disease) ([place])    a. Abdominal aortogram 10/11/16: Infrarenal abdominal aorta heavily calcified &  ectatic with approximately 40-50% stenosis at the bifurcation. Mild diffuse disease w/ heavy calcification noted in the common & external iliac arteries bilaterally  . Type I diabetes mellitus ([place])   . Ventricular fibrillation ([place]) 10/11/2016   a. Felt to be secondary to demand ischemia in the setting of underlying CAD secondary to anaphylactic reaction from IV Rocephin     Most Recent Cardiac Studies: LHC 10/11/2016: Conclusion   Conclusions: 1. Severe 2 vessel coronary artery disease, including heavily calcified 95% proximal/mid LCx and sequential 90-99% mid RCA stenoses. 2. Mild to moderate, nonobstructive disease involving the LMCA and LAD. 3. Overall preserved LV contraction with basal inferior hypokinesis. 4. Upper normal to mildly elevated left ventricular filling pressure (LVEDP 15-20 mmHg). 5. Peripheral vascular disease with ectasia and calcification of the infrarenal abdominal aorta and iliac arteries.  Recommendations: 1. I suspect patient's cardiac arrest was due to demand ischemia in the setting of anaphylactic shock from allergic reaction to ceftriaxone. She has TIMI-3 flow in all major epicardial coronary arteries. I recommend medical therapy and treatment of the underlying infection. If she has a meaningful neurologic recovery, atherectomy/PCI to the RCA and LCx will need to be considered. 2. Obtain transthoracic echocardiogram to confirm LVEF and evaluate for other structural abnormalities. 3. I would like to avoid intra-aortic balloon pump placement if possible, given aortic disease. If hypotension becomes a problem, judicious vasopressor support is recommended. 4. Low-dose aspirin and high intensity statin  therapy. Consider heparin infusion (ACS nomogram) if no invasive procedures are necessary (i.e. percutaneous nephrostomy tube). 5. Consider addition of low-dose beta blocker if heart rate and blood pressure allow. If significant ventricular ectopy is observed,  amiodarone infusion should be considered.    Coronary Findings   Dominance: Right  Left Main  Vessel is large.  LM lesion, 20% stenosed.  Left Anterior Descending  Vessel is moderate in size. There is mild the vessel.  Ost LAD lesion, 40% stenosed.  Prox LAD to Mid LAD lesion, 40% stenosed. The lesion is eccentric. The lesion is calcified.  Dist LAD lesion, 40% stenosed.  Second Diagonal Branch  Vessel is small in size.  Third Diagonal Branch  Vessel is moderate in size.  Ramus Intermedius  Vessel is small.  Left Circumflex  Vessel is moderate in size.  Prox Cx to Mid Cx lesion, 95% stenosed. The lesion is eccentric. The lesion is severely calcified.  First Obtuse Marginal Branch  Vessel is small in size.  Second Obtuse Marginal Branch  Vessel is moderate in size.  2nd Mrg lesion, 25% stenosed.  Third Obtuse Marginal Branch  Vessel is moderate in size.  Right Coronary Artery  Vessel is large.  Prox RCA lesion, 30% stenosed. The lesion is eccentric. The lesion is severely calcified.  Mid RCA-1 lesion, 90% stenosed. The lesion is focal and eccentric. The lesion is severely calcified.  Mid RCA-2 lesion, 99% stenosed. The lesion is severely calcified.  Right Posterior Descending Artery  Vessel is moderate in size.  Right Posterior Atrioventricular Branch  Vessel is moderate in size.    Diagnostic Diagram        Abdominal aortogram 10/11/2016: Vascular Findings   Abdominal Aorta The infrarenal abdominal aorta is heavily calcified and ectatic with approximately 40-50% stenosis at the bifurcation. Mild diffuse disease with heavy calcification is noted in the common and external iliac arteries bilaterally.       TTE 10/12/2016: Study Conclusions  - Left ventricle: The cavity size was normal. Systolic function was normal. The estimated ejection fraction was in the range of 55% to 60%. Wall motion was grossly normal; Unable to exclude  mild hypokinesis of the inferior wall. Left ventricular diastolic function parameters were normal. - Left atrium: The atrium was normal in size. - Right ventricle: Systolic function was normal. - Pulmonary arteries: Systolic pressure was within the normal range.   Surgical History:  Past Surgical History:  Procedure Laterality Date  . ABDOMINAL AORTOGRAM N/A 10/11/2016   Procedure: ABDOMINAL AORTOGRAM;  Surgeon: Nelva Bush, MD;  Location: [place] [facility];  Service: Cardiovascular;  Laterality: N/A;  . LEFT HEART CATH AND CORONARY ANGIOGRAPHY N/A 10/11/2016   Procedure: LEFT HEART CATH AND CORONARY ANGIOGRAPHY;  Surgeon: Nelva Bush, MD;  Location: [place] [facility];  Service: Cardiovascular;  Laterality: N/A;     Home Meds: Prior to Admission medications   Medication Sig Start Date End Date Taking? Authorizing Provider  ALPRAZolam (XANAX) 0.25 MG tablet Take 0.25 mg by mouth daily.    [provider]  aspirin EC 81 MG tablet Take 81 mg by mouth daily.    [provider]  brompheniramine-pseudoephedrine-DM 30-2-10 MG/5ML syrup Take 5 mLs by mouth 4 (four) times daily as needed. Patient not taking: Reported on 10/11/2016 05/29/15   Sable Feil, PA-C  diltiazem (CARDIZEM CD) 120 MG 24 hr capsule Take 1 capsule by mouth daily. 09/29/16   [provider]  ezetimibe-simvastatin (VYTORIN) 10-20 MG tablet Take 1  tablet by mouth daily.    [provider]  HUMALOG 100 UNIT/ML injection TO BE USED WITH INSULIN PUMP 09/14/16   [provider]  sulfamethoxazole-trimethoprim (BACTRIM DS,SEPTRA DS) 800-160 MG tablet Take 1 tablet by mouth 2 (two) times daily. Patient not taking: Reported on 10/11/2016 05/29/15   Sable Feil, PA-C  Vitamin D, Ergocalciferol, (DRISDOL) 50000 units CAPS capsule Take 50,000 Units by mouth every 30 (thirty) days.    [provider]    Allergies:  Allergies  Allergen Reactions  . Ceftriaxone      Anaphylaxis, cardiac arrest    Social History   Social History  . Marital status: Married    Spouse name: N/A  . Number of children: N/A  . Years of education: N/A   Occupational History  . Not on file.   Social History Main Topics  . Smoking status: Former Research scientist (life sciences)  . Smokeless tobacco: Never Used  . Alcohol use 0.0 oz/week  . Drug use: No  . Sexual activity: Not Currently   Other Topics Concern  . Not on file   Social History Narrative  . No narrative on file     Family History  Problem Relation Age of Onset  . Hypertension Mother     Review of Systems: Review of Systems  Constitutional: Positive for malaise/fatigue. Negative for chills, diaphoresis, fever and weight loss.  HENT: Negative for congestion.   Eyes: Negative for discharge and redness.  Respiratory: Positive for cough and shortness of breath. Negative for hemoptysis, sputum production and wheezing.   Cardiovascular: Positive for chest pain. Negative for palpitations, orthopnea, claudication, leg swelling and PND.       Sore from chest compressions  Gastrointestinal: Negative for abdominal pain, blood in stool, heartburn, melena, nausea and vomiting.  Genitourinary: Positive for flank pain. Negative for hematuria.  Musculoskeletal: Negative for falls and myalgias.  Skin: Negative for rash.  Neurological: Positive for weakness. Negative for dizziness, tingling, tremors, sensory change, speech change, focal weakness and loss of consciousness.  Endo/Heme/Allergies: Does not bruise/bleed easily.  Psychiatric/Behavioral: Negative for substance abuse. The patient is not nervous/anxious.   All other systems reviewed and are negative.   Labs:   Lab Results  Component Value Date   WBC 28.3 (H) 10/13/2016   HGB 10.7 (L) 10/13/2016   HCT 31.4 (L) 10/13/2016   MCV 83.1 10/13/2016   PLT 241 10/13/2016     Recent Labs Lab 10/13/16 0452  NA 125*  K 3.7  CL 96*  CO2 21*  BUN 20  CREATININE 0.80   CALCIUM 6.6*  PROT 5.3*  BILITOT 0.9  ALKPHOS 71  ALT 64*  AST 133*  GLUCOSE 252*    Recent Labs  10/11/16 1237 10/11/16 1651 10/11/16 2306 10/12/16 0455  TROPONINI 7.82* 7.77* 5.68* 3.19*   Lab Results  Component Value Date   CHOL 81 10/13/2016   HDL 34 (L) 10/13/2016   LDLCALC 36 10/13/2016   TRIG 56 10/13/2016   No results found for: DDIMER  Radiology/Studies:  Dg Chest 1 View  Result Date: 10/11/2016 IMPRESSION: 1. Interval placement of an enteric tube with the tip and distal side port in the gastric body. 2.  No active cardiopulmonary disease. Electronically Signed   By: Titus Dubin M.D.   On: 10/11/2016 13:32   Dg Chest 1 View  Result Date: 10/11/2016 IMPRESSION: No acute abnormalities. Electronically Signed   By: Lavonia Dana M.D.   On: 10/11/2016 11:52   Dg Chest [facility]  1 View  Result Date: 10/13/2016 IMPRESSION: 1.  Interim removal of endotracheal tube and NG tube. 2. New onset of diffuse bilateral pulmonary interstitial prominence consistent with a process such as interstitial pneumonitis or interstitial edema. Small right pleural effusion. Electronically Signed   By: Marcello Moores  Register   On: 10/13/2016 06:19   Ct Renal Stone Study  Result Date: 10/11/2016 IMPRESSION: 1. 4 mm distal left ureteral calculus with mild hydroureteronephrosis. 2. Punctate nonobstructing bilateral renal calculi. 3. Small hiatal hernia. 4. Aortic Atherosclerosis (ICD10-I70.0). Mild infrarenal aortic ectasia and suspected short segment aortic dissection. Electronically Signed   By: Logan Bores M.D.   On: 10/11/2016 08:20     EKG: Interpreted by me showed: 10/11/16 - 14:27 demonstrates normal sinus rhythm with right bundle branch block and left anterior fascicular block Telemetry: Interpreted by me showed: NSR 90s to sinus tachycardia into the 110s bpm, no ventricular ectopy   Weights: Intake/Output Summary (Last 24 hours) at 10/13/16 0736 Last data filed at 10/13/16 0600  Gross  per 24 hour  Intake          4567.77 ml  Output             2945 ml  Net          1622.77 ml       Filed Weights   10/11/16 0625 10/11/16 1238  Weight: 135 lb (61.2 kg) 149 lb 0.5 oz (67.6 kg)   Vitals:   10/13/16 0300 10/13/16 0400 10/13/16 0500 10/13/16 0600  BP: (!) 141/72 138/61 133/61 131/61  Pulse: (!) 111 (!) 113 (!) 108 (!) 109  Resp: (!) 31 (!) 25 (!) 30 (!) 31  Temp:  97.9 F (36.6 C)    TempSrc:  Oral    SpO2: 91% 92% 92% 92%  Weight:      Height:         Physical Exam: General: Well developed, well nourished, in no acute distress. Head: Normocephalic, atraumatic, sclera non-icteric, no xanthomas, nares are without discharge.  Neck: Negative for carotid bruits. JVD not elevated. Lungs: Diminished breath sounds bilaterally with bibasilar crackles and expiratory wheezing. Heart: RRR with S1 S2. No murmurs, rubs, or gallops appreciated. Abdomen: Soft, non-tender, non-distended with normoactive bowel sounds. No hepatomegaly. No rebound/guarding. No obvious abdominal masses. Msk:  Strength and tone appear normal for age. Extremities: No clubbing or cyanosis. No edema. Distal pedal pulses are 2+ and equal bilaterally. Neuro: Alert and oriented X 3. No focal deficit. No facial asymmetry. Moves all extremities spontaneously. Psych:  Responds to questions appropriately with a normal affect.    ASSESSMENT AND PLAN:  Principal Problem:   Cardiac arrest with ventricular fibrillation (HCC) Active Problems:   Non-ST elevation (NSTEMI) myocardial infarction ([place])   Respiratory failure (HCC)   Cardiogenic shock (HCC)   Anaphylaxis   Shock liver   Hypomagnesemia   Renal stone   Leukocytosis   AKI (acute kidney injury) ([place])   1. Vfib arrest/NSTEMI/cardiogenic shock/respiratory failure/CAD/anoxic encephalopathy: -Likely in the setting of demand ischemia with underlying CAD as above noted on emergent LHC 2/2 anaphylaxis from Rocephin -Pressures improved  off sedation, off Levophed as of 9/12 with stable BP -Continue heparin gtt given no planned urological interventions at this time -Add Coreg 6.25 mg bid, when able given desaturation with coughing and wheezing on exam -Consider EP evaluation for possible ICD prior to discharge given VF arrest -TTE showed preserved EF with mild HK of the inferior wall -Maintain potassium >4.0 and magnesium >2.0 (  has received IV potassium and magnesium)  -TSH normal -Extubated on 9/12, on 2 L via [place], though had an episode overnight with increased SB requiring 5 L via [place]. Given IV Lasix with brisk UOP. CXR on 9/13 showed new onset of diffuse bilateral pulmonary interstitial pneumonitis vs edema with a small right pleural effusion. Remains on 2 L via  -She did drink a lot of fluids overnight -Will start IV Lasix 20 mg bid with KCl repletion pending recheck sodium level  -Case was discussed between Drs. Rockey Situ and Arida, who recommend transfer to [facility] for evaluation of possible CABG vs high-risk atherectomy  -CVTS will need to be notified when patient arrives (I will also let card master know) -Will plan to transfer 9/13, [facility] called, IM and PCCM notified  -[facility] protocol completed  -ASA -EEG normal  2. Anaphylaxis: -Felt to be 2/2 Rocephin -She had a similar response to Rocephin at PCP office several months prior, though rhythm unknown at that time -Rocephin has been added as an allergy, would avoid medication and all similar medications given robust physiologic response   3. Left renal stone/leukocytosis: -Previously on IV Cipro -Discussed with PCCM, continue PO Cipro 500 mg bid x 5 days (stop PM of 9/17) -Leukocytosis improving  -Urology following, planning for outpatient follow up -Could involve Urology at [facility]  4. AKI: -Improved  5. Hypomagnesemia: -Recheck level this morning to confirm given numerous lab abnormalities  -Replete via IV to a goal > 2.0 -Replete potassium to goal >  4.0  6. Hyponatremia: -Repeat this AM -Would avoid fluids given new onset of diffuse bilateral pulmonary edema -Will give IV Lasix given volume overload on CXR with close monitoring of sodium level  -Recommend PCCM/IM assistance   7. Shock liver: -Possibly in the setting of #1 -Trend -Recommend consult IM  8. Anemia: -Likely dilutional  -Monitor -No signs of bleeding  9. Type I diabetes: -Insulin pump at home -Lantus and SSI per PCCM -Diabetes coordinator   10. Possible COPD: -Prior tobacco abuse -No acute exacerbation -Monitor   Signed, Christell Faith, PA-C [facility] Pager: [phone] 10/13/2016, 8:38 AM

## 2016-10-13 NOTE — Consult Note (Signed)
Medical Consultation   Lisa [name]  ERX:[id number]  DOB: May 19, 1950  DOA: 10/13/2016  PCP: Lenard Simmer, MD     Requesting physician: Dr. Radford Pax  Reason for consultation: Medical management  History of Present Illness: Lisa [name] is an 66 y.o. female was transferred from [facility] after being admitted there on 9/11 with flank pain. Patient was found to have nephrolithiasis and a UTI.  Patient was given IV Rocephin in the ER and immediately had a reaction with seizure-like activity and a V. fib arrest.  Patient was coded and admitted to the ICU intubated and placed on the cool protocol for STEMI.  Patient was taken to the Cath Lab where she was found to have severe 2 vessel coronary artery disease. It was felt that patient had a cardiac arrest due to demand ischemia in the setting of an anaphylactic shock from an allergic reaction to ceftriaxone. Patient has since been transferred to [facility] on cardiology service for evaluation by CVTS for CABG.  Patient was also seen at [facility] by urology (Dr. Junious Silk), she was started on Cipro for treatment of her urinary tract infection: Both Escherichia coli and Klebsiella.  Plans were for a repeat ultrasound to see if patient has since passed the stone.  Patient was also found to have an abnormality of a low-sodium on a BMP. It was noted in discharge summary the patient was drinking large amounts of water. Overnight between 9/12 and 9/13 patient developed increasing shortness of breath requiring an increase in oxygen from 2 L to 6 L. X-ray done there showed diffuse bilateral pulmonary interstitial pneumonitis versus edema.  Patient currently only complaining of chest tightness as well as shortness of breath and pain with deep inspiration.  She denies fever and chills.       Review of Systems:  ROS All systems reviewed, negative unless stated above    Past Medical History: Past Medical  History:  Diagnosis Date  . CAD (coronary artery disease)    a. LHC 10/11/2016: LM 20%, oLAD 40%, p-mLAD 40% eccentric/calcified, dLAD 40%, p-mLCx 95%, eccentric, sev calcified, pRCA 30%, eccentric, sev calcified, mRCA lesion-1 90% focal & eccentric, sev calcified, mRCA lesion-2 99% sev calcified, EF 55-65%  . Nephrolithiasis   . NSTEMI (non-ST elevated myocardial infarction) ([place]) 10/11/2016  . PVD (peripheral vascular disease) ([place])    a. Abdominal aortogram 10/11/16: Infrarenal abdominal aorta heavily calcified & ectatic with approximately 40-50% stenosis at the bifurcation. Mild diffuse disease w/ heavy calcification noted in the common & external iliac arteries bilaterally  . Type I diabetes mellitus ([place])   . Ventricular fibrillation ([place]) 10/11/2016   a. Felt to be secondary to demand ischemia in the setting of underlying CAD secondary to anaphylactic reaction from IV Rocephin    Past Surgical History: Past Surgical History:  Procedure Laterality Date  . ABDOMINAL AORTOGRAM N/A 10/11/2016   Procedure: ABDOMINAL AORTOGRAM;  Surgeon: Nelva Bush, MD;  Location: [place] [facility];  Service: Cardiovascular;  Laterality: N/A;  . LEFT HEART CATH AND CORONARY ANGIOGRAPHY N/A 10/11/2016   Procedure: LEFT HEART CATH AND CORONARY ANGIOGRAPHY;  Surgeon: Nelva Bush, MD;  Location: [place] [facility];  Service: Cardiovascular;  Laterality: N/A;     Allergies:   Allergies  Allergen Reactions  . Ceftriaxone     Anaphylaxis, cardiac arrest     Social History:  reports that she has  quit smoking. She has never used smokeless tobacco. She reports that she drinks alcohol. She reports that she does not use drugs.   Family History: Family History  Problem Relation Age of Onset  . Hypertension Mother        Physical Exam: Vitals:   10/13/16 1056 10/13/16 1100 10/13/16 1147 10/13/16 1200  BP: (!) 160/139 (!) 160/139  (!) 162/68  Pulse: (!) 108 (!) 106  (!) 102  Resp: (!)  33 (!) 32  (!) 29  Temp: 98.9 F (37.2 C)  98.5 F (36.9 C)   TempSrc: Oral  Oral   SpO2: 90% 92%  95%  Weight:  67.5 kg (148 lb 13 oz)    Height:  5\' 3"  (1.6 m)      Constitutional: uncomfortable appearing Eyes: PERLA, EOMI, irises appear normal, anicteric sclera,  ENMT: external ears and nose appear normal, normal hearing            Lips appears normal, oropharynx mucosa, tongue, posterior pharynx appear normal  Neck: neck appears normal, no masses, normal ROM, no thyromegaly CVS: mildly tachy, no murmur rubs or gallops, no LE edema, normal pedal pulses  Respiratory:  Crackles b/l lungs, chest tender to palpation Abdomen: soft nontender, nondistended, normal bowel sounds, no hepatosplenomegaly, no hernias  Musculoskeletal: : no cyanosis, clubbing or edema noted bilaterally             Neuro: Cranial nerves II-XII intact, strength, sensation, reflexes Psych: judgement and insight appear normal, stable mood and affect, mental status Skin: no rashes or lesions or ulcers, no induration or nodules     Data reviewed:  I have personally reviewed following labs and imaging studies Labs:  CBC:  Recent Labs Lab 10/11/16 0711 10/12/16 0455 10/12/16 0653 10/13/16 0452  WBC 13.5* 51.8* 51.3* 28.3*  NEUTROABS 11.5*  --  46.8*  --   HGB 12.8 12.7 13.3 10.7*  HCT 38.0 38.6 41.1 31.4*  MCV 82.3 84.7 85.1 83.1  PLT 353 329 348 578    Basic Metabolic Panel:  Recent Labs Lab 10/11/16 1237 10/11/16 1651 10/12/16 0138 10/12/16 0455 10/13/16 0452 10/13/16 0829 10/13/16 1217  NA 137  --  134* 135 125* 123*  --   K 3.2*  --  4.6 4.5 3.7 3.4*  --   CL 105  --  109 107 96* 94*  --   CO2 16*  --  20* 21* 21* 20*  --   GLUCOSE 291*  --  246* 194* 252* 240*  --   BUN 14  --  21* 22* 20 18  --   CREATININE 0.99  --  1.03* 1.03* 0.80 0.70  --   CALCIUM 7.8*  --  6.7* 6.7* 6.6* 6.4*  --   MG  --  1.8  --   --  1.1*  --  1.6*  PHOS  --  3.3  --   --   --   --   --    GFR Estimated  Creatinine Clearance: 65.5 mL/min (by C-G formula based on SCr of 0.7 mg/dL). Liver Function Tests:  Recent Labs Lab 10/11/16 0711 10/13/16 0452  AST 22 133*  ALT 21 64*  ALKPHOS 80 71  BILITOT 0.6 0.9  PROT 7.5 5.3*  ALBUMIN 3.8 2.6*    Recent Labs Lab 10/11/16 0711  LIPASE 18   No results for input(s): AMMONIA in the last 168 hours. Coagulation profile  Recent Labs Lab 10/11/16 1237  INR 1.72  Cardiac Enzymes:  Recent Labs Lab 10/11/16 1237 10/11/16 1651 10/11/16 2306 10/12/16 0455  TROPONINI 7.82* 7.77* 5.68* 3.19*   BNP: Invalid input(s): POCBNP CBG:  Recent Labs Lab 10/12/16 2356 10/13/16 0352 10/13/16 0758 10/13/16 0934 10/13/16 1146  GLUCAP 245* 236* 263* 273* 195*   D-Dimer No results for input(s): DDIMER in the last 72 hours. Hgb A1c No results for input(s): HGBA1C in the last 72 hours. Lipid Profile  Recent Labs  10/11/16 1651 10/13/16 0452  CHOL  --  81  HDL  --  34*  LDLCALC  --  36  TRIG 66 56  CHOLHDL  --  2.4   Thyroid function studies  Recent Labs  10/12/16 1319  TSH 1.154   Anemia work up No results for input(s): VITAMINB12, FOLATE, FERRITIN, TIBC, IRON, RETICCTPCT in the last 72 hours. Urinalysis    Component Value Date/Time   COLORURINE YELLOW (A) 10/11/2016 0711   APPEARANCEUR CLOUDY (A) 10/11/2016 0711   LABSPEC 1.011 10/11/2016 0711   PHURINE 7.0 10/11/2016 0711   GLUCOSEU NEGATIVE 10/11/2016 0711   HGBUR MODERATE (A) 10/11/2016 0711   BILIRUBINUR NEGATIVE 10/11/2016 0711   KETONESUR NEGATIVE 10/11/2016 0711   PROTEINUR 30 (A) 10/11/2016 0711   NITRITE POSITIVE (A) 10/11/2016 0711   LEUKOCYTESUR LARGE (A) 10/11/2016 0711     Sepsis Labs Invalid input(s): PROCALCITONIN,  WBC,  LACTICIDVEN Microbiology Recent Results (from the past 240 hour(s))  Urine culture     Status: Abnormal (Preliminary result)   Collection Time: 10/11/16  7:11 AM  Result Value Ref Range Status   Specimen Description  URINE, CLEAN CATCH  Final   Special Requests Normal  Final   Culture (A)  Final    >=100,000 COLONIES/mL KLEBSIELLA PNEUMONIAE >=100,000 COLONIES/mL ESCHERICHIA COLI    Report Status PENDING  Incomplete  MRSA PCR Screening     Status: None   Collection Time: 10/11/16  4:35 PM  Result Value Ref Range Status   MRSA by PCR NEGATIVE NEGATIVE Final    Comment:        The GeneXpert MRSA Assay (FDA approved for NASAL specimens only), is one component of a comprehensive MRSA colonization surveillance program. It is not intended to diagnose MRSA infection nor to guide or monitor treatment for MRSA infections.   Culture, blood (Routine X 2) w Reflex to ID Panel     Status: None (Preliminary result)   Collection Time: 10/12/16  6:53 AM  Result Value Ref Range Status   Specimen Description BLOOD RIGHT HAND  Final   Special Requests   Final    BOTTLES DRAWN AEROBIC AND ANAEROBIC Blood Culture results may not be optimal due to an inadequate volume of blood received in culture bottles   Culture NO GROWTH < 24 HOURS  Final   Report Status PENDING  Incomplete  Culture, blood (Routine X 2) w Reflex to ID Panel     Status: None (Preliminary result)   Collection Time: 10/12/16  6:53 AM  Result Value Ref Range Status   Specimen Description BLOOD LEFT HAND  Final   Special Requests   Final    BOTTLES DRAWN AEROBIC AND ANAEROBIC Blood Culture results may not be optimal due to an inadequate volume of blood received in culture bottles   Culture NO GROWTH < 24 HOURS  Final   Report Status PENDING  Incomplete       Inpatient Medications:   Scheduled Meds: . [START ON 10/14/2016] ALPRAZolam  0.25 mg Oral Daily  . [  START ON 10/14/2016] aspirin EC  81 mg Oral Daily  . atorvastatin  80 mg Oral q1800  . ciprofloxacin  500 mg Oral BID  . insulin aspart  0-9 Units Subcutaneous TID WC  . insulin aspart  3 Units Subcutaneous TID WC  . [START ON 10/14/2016] insulin glargine  18 Units Subcutaneous Daily    . ipratropium-albuterol  3 mL Nebulization Q6H   Continuous Infusions: . calcium gluconate    . heparin 950 Units/hr (10/13/16 1145)  . magnesium sulfate 1 - 4 g bolus IVPB 2 g (10/13/16 1326)     Radiological Exams on Admission: Dg Chest Port 1 View  Result Date: 10/13/2016 CLINICAL DATA:  Respiratory failure. EXAM: PORTABLE CHEST 1 VIEW COMPARISON:  10/11/2016. FINDINGS: Interim removal of endotracheal tube and NG tube. Heart size normal. New onset of diffuse bilateral pulmonary interstitial prominence noted. Findings consistent with a process such as interstitial pneumonitis or interstitial edema. Small right pleural effusion. No pneumothorax . IMPRESSION: 1.  Interim removal of endotracheal tube and NG tube. 2. New onset of diffuse bilateral pulmonary interstitial prominence consistent with a process such as interstitial pneumonitis or interstitial edema. Small right pleural effusion. Electronically Signed   By: Marcello Moores  Register   On: 10/13/2016 06:19    Impression/Recommendations Principal Problem:   Cardiac arrest with ventricular fibrillation (HCC) Active Problems:   Non-ST elevation (NSTEMI) myocardial infarction (HCC)   Leukocytosis   Respiratory failure ([place])   Cardiogenic shock (HCC)   Anaphylaxis   Left ureteral stone   Shock liver   Hypomagnesemia   Ventricular fibrillation (HCC)   Hyponatremia  Ureteral stone -culture growing: ecoli/klebsiella -cipro PO -left message with Dr. Junious Silk -renal U/S ordered  Acute respiratory failure -wean O2 as tolerated -suspect will get better with diuresis -watch for PNA  Hyponatremia -suspect from volume overload vs excessive PO water intake -monitor -urine studies and serum osmos ordered -BMP pending  DM type 1 -uses pump at home -here will use lantus and SSI/novolog  Shock liver -trend LFTs  Volume overload -x ray consistent with fluid -IV lasix and monitor output  Leukocytosis -trending  down -?stress  Hypomagnesemia/hypokalemia -replace and recheck  Cardiac arrest due to v fib due to anaphylaxis -avoid PCN/CEFTALOSPORINS  - plan for eval by CVTS for 2 vessel disease found on cath -d/c femoral line   Thank you for this consultation.  Our [facility] hospitalist team will follow the patient with you.   Time Spent: 75 min  [place] DO [facility] 10/13/2016, 1:31 PM

## 2016-10-13 NOTE — Progress Notes (Signed)
ANTICOAGULATION CONSULT NOTE - Initial Consult  Pharmacy Consult for heparin Indication: chest pain/ACS  Allergies  Allergen Reactions  . Ceftriaxone     Anaphylaxis, cardiac arrest    Patient Measurements: Height: 5\' 3"  (160 cm) Weight: 148 lb 13 oz (67.5 kg) IBW/kg (Calculated) : 52.4 Heparin Dosing Weight: 66 kg  Vital Signs: Temp: 98.5 F (36.9 C) (09/13 1147) Temp Source: Oral (09/13 1147) BP: 108/96 (09/13 1407) Pulse Rate: 100 (09/13 1407)  Labs:  Recent Labs  10/11/16 1237 10/11/16 1651 10/11/16 2306  10/12/16 0455 10/12/16 0653 10/12/16 1319 10/13/16 0452 10/13/16 0518 10/13/16 0829 10/13/16 1315  HGB  --   --   --   --  12.7 13.3  --  10.7*  --   --   --   HCT  --   --   --   --  38.6 41.1  --  31.4*  --   --   --   PLT  --   --   --   --  329 348  --  241  --   --   --   APTT 55*  --   --   --   --   --   --   --   --   --   --   LABPROT 20.0*  --   --   --   --   --   --   --   --   --   --   INR 1.72  --   --   --   --   --   --   --   --   --   --   HEPARINUNFRC  --   --   --   < >  --  0.51 0.51  --  0.25*  --  0.39  CREATININE 0.99  --   --   < > 1.03*  --   --  0.80  --  0.70  --   TROPONINI 7.82* 7.77* 5.68*  --  3.19*  --   --   --   --   --   --   < > = values in this interval not displayed.  Estimated Creatinine Clearance: 65.5 mL/min (by C-G formula based on SCr of 0.7 mg/dL).   Medical History: Past Medical History:  Diagnosis Date  . CAD (coronary artery disease)    a. LHC 10/11/2016: LM 20%, oLAD 40%, p-mLAD 40% eccentric/calcified, dLAD 40%, p-mLCx 95%, eccentric, sev calcified, pRCA 30%, eccentric, sev calcified, mRCA lesion-1 90% focal & eccentric, sev calcified, mRCA lesion-2 99% sev calcified, EF 55-65%  . Nephrolithiasis   . NSTEMI (non-ST elevated myocardial infarction) ([place]) 10/11/2016  . PVD (peripheral vascular disease) ([place])    a. Abdominal aortogram 10/11/16: Infrarenal abdominal aorta heavily calcified & ectatic with  approximately 40-50% stenosis at the bifurcation. Mild diffuse disease w/ heavy calcification noted in the common & external iliac arteries bilaterally  . Type I diabetes mellitus ([place])   . Ventricular fibrillation ([place]) 10/11/2016   a. Felt to be secondary to demand ischemia in the setting of underlying CAD secondary to anaphylactic reaction from IV Rocephin    Assessment: 17 yoF [facility] on heparin per pharmacy for ACS r/o. Pt presented with VFib arrest and was found to have severe 2-vessel CAD during LHC. Pt currently to remain on heparin infusion while TCTS consulted for cardiac surgery. Heparin level currently therapeutic at 0.39.  Goal of Therapy:  Heparin level 0.3-0.7 units/ml Monitor platelets by anticoagulation protocol: Yes   Plan:  -Continue heparin 950 units/hr -Monitor heparin level, CBC, S/Sx bleeding daily -F/U TCTS recommendations  Arrie Senate, PharmD PGY-2 Cardiology Pharmacy Resident Pager: [phone] 10/13/2016

## 2016-10-13 NOTE — Consult Note (Signed)
Urology Consult  Referring physician: Dr. Radford Pax Reason for referral: right ureteral calculus  Chief Complaint: urinary urgency  History of Present Illness: Lisa [name] is a 66yo with a hx of nephrolithiasis transferred to [facility] after NSTEMi and cardiac arrest after given rocepin at [facility]. She presented with severe sharp, intermittent nonradiating right flank pain. She underwent CT stone study which showed a 66mm right distal ureteral calculus.She was given rocephin fo a presumed UTI. Currently she denies any flank pain. The pain resolved yesterday. She denies any worsening urinary urgency or urinary frequency.  She does have new mild urinary urgency. Urine culture is growing e coli and klebsiella. Sensitivities pending.  Past Medical History:  Diagnosis Date  . CAD (coronary artery disease)    a. LHC 10/11/2016: LM 20%, oLAD 40%, p-mLAD 40% eccentric/calcified, dLAD 40%, p-mLCx 95%, eccentric, sev calcified, pRCA 30%, eccentric, sev calcified, mRCA lesion-1 90% focal & eccentric, sev calcified, mRCA lesion-2 99% sev calcified, EF 55-65%  . Nephrolithiasis   . NSTEMI (non-ST elevated myocardial infarction) ([place]) 10/11/2016  . PVD (peripheral vascular disease) ([place])    a. Abdominal aortogram 10/11/16: Infrarenal abdominal aorta heavily calcified & ectatic with approximately 40-50% stenosis at the bifurcation. Mild diffuse disease w/ heavy calcification noted in the common & external iliac arteries bilaterally  . Type I diabetes mellitus ([place])   . Ventricular fibrillation ([place]) 10/11/2016   a. Felt to be secondary to demand ischemia in the setting of underlying CAD secondary to anaphylactic reaction from IV Rocephin   Past Surgical History:  Procedure Laterality Date  . ABDOMINAL AORTOGRAM N/A 10/11/2016   Procedure: ABDOMINAL AORTOGRAM;  Surgeon: Nelva Bush, MD;  Location: [place] [facility];  Service: Cardiovascular;  Laterality: N/A;  . LEFT HEART CATH AND CORONARY ANGIOGRAPHY N/A  10/11/2016   Procedure: LEFT HEART CATH AND CORONARY ANGIOGRAPHY;  Surgeon: Nelva Bush, MD;  Location: [place] [facility];  Service: Cardiovascular;  Laterality: N/A;    Medications: I have reviewed the patient's current medications. Allergies:  Allergies  Allergen Reactions  . Ceftriaxone     Anaphylaxis, cardiac arrest    Family History  Problem Relation Age of Onset  . Hypertension Mother    Social History:  reports that she has quit smoking. She has never used smokeless tobacco. She reports that she drinks alcohol. She reports that she does not use drugs.  Review of Systems  Constitutional: Positive for malaise/fatigue.  Respiratory: Positive for shortness of breath.   Genitourinary: Positive for urgency.  All other systems reviewed and are negative.   Physical Exam:  Vital signs in last 24 hours: Temp:  [97.9 F (36.6 C)-98.9 F (37.2 C)] 98.5 F (36.9 C) (09/13 2000) Pulse Rate:  [96-113] 102 (09/13 2100) Resp:  [23-37] 29 (09/13 2100) BP: (108-163)/(59-139) 134/95 (09/13 2100) SpO2:  [90 %-97 %] 91 % (09/13 2100) Weight:  [67.5 kg (148 lb 13 oz)] 67.5 kg (148 lb 13 oz) (09/13 1100) Physical Exam  Constitutional: She is oriented to person, place, and time. She appears well-developed and well-nourished.  HENT:  Head: Normocephalic and atraumatic.  Eyes: Pupils are equal, round, and reactive to light. EOM are normal.  Neck: Normal range of motion. No thyromegaly present.  Cardiovascular: Normal rate.   Respiratory: Effort normal. No respiratory distress.  GI: Soft. She exhibits no distension and no mass. There is no tenderness. There is no rebound and no guarding.  Musculoskeletal: Normal range of motion. She exhibits no edema.  Neurological: She is  alert and oriented to person, place, and time.  Skin: Skin is warm and dry. No erythema.  Psychiatric: She has a normal mood and affect. Her behavior is normal. Judgment and thought content normal.     Laboratory Data:  Results for orders placed or performed during the hospital encounter of 10/13/16 (from the past 72 hour(s))  Glucose, capillary     Status: Abnormal   Collection Time: 10/13/16 11:46 AM  Result Value Ref Range   Glucose-Capillary 195 (H) 65 - 99 mg/dL   Comment 1 Notify RN   Magnesium     Status: Abnormal   Collection Time: 10/13/16 12:17 PM  Result Value Ref Range   Magnesium 1.6 (L) 1.7 - 2.4 mg/dL  Brain natriuretic peptide     Status: Abnormal   Collection Time: 10/13/16 12:17 PM  Result Value Ref Range   B Natriuretic Peptide 217.2 (H) 0.0 - 100.0 pg/mL  Heparin level (unfractionated)     Status: None   Collection Time: 10/13/16  1:15 PM  Result Value Ref Range   Heparin Unfractionated 0.39 0.30 - 0.70 IU/mL    Comment:        IF HEPARIN RESULTS ARE BELOW EXPECTED VALUES, AND PATIENT DOSAGE HAS BEEN CONFIRMED, SUGGEST FOLLOW UP TESTING OF ANTITHROMBIN III LEVELS.   Procalcitonin - Baseline     Status: None   Collection Time: 10/13/16  2:46 PM  Result Value Ref Range   Procalcitonin 20.66 ng/mL    Comment:        Interpretation: PCT >= 10 ng/mL: Important systemic inflammatory response, almost exclusively due to severe bacterial sepsis or septic shock. (NOTE)         ICU PCT Algorithm               Non ICU PCT Algorithm    ----------------------------     ------------------------------         PCT < 0.25 ng/mL                 PCT < 0.1 ng/mL     Stopping of antibiotics            Stopping of antibiotics       strongly encouraged.               strongly encouraged.    ----------------------------     ------------------------------       PCT level decrease by               PCT < 0.25 ng/mL       >= 80% from peak PCT       OR PCT 0.25 - 0.5 ng/mL          Stopping of antibiotics                                             encouraged.     Stopping of antibiotics           encouraged.    ----------------------------      ------------------------------       PCT level decrease by              PCT >= 0.25 ng/mL       < 80% from peak PCT        AND PCT >= 0.5 ng/mL  Continuing antibiotics                                              encouraged.       Continuing antibiotics            encouraged.    ----------------------------     ------------------------------     PCT level increase compared          PCT > 0.5 ng/mL         with peak PCT AND          PCT >= 0.5 ng/mL             Escalation of antibiotics                                          strongly encouraged.      Escalation of antibiotics        strongly encouraged.   Osmolality     Status: Abnormal   Collection Time: 10/13/16  3:00 PM  Result Value Ref Range   Osmolality 260 (L) 275 - 295 mOsm/kg  Magnesium     Status: None   Collection Time: 10/13/16  3:00 PM  Result Value Ref Range   Magnesium 2.3 1.7 - 2.4 mg/dL  Glucose, capillary     Status: Abnormal   Collection Time: 10/13/16  4:14 PM  Result Value Ref Range   Glucose-Capillary 197 (H) 65 - 99 mg/dL   Comment 1 Notify RN   Urinalysis, Routine w reflex microscopic     Status: Abnormal   Collection Time: 10/13/16  6:21 PM  Result Value Ref Range   Color, Urine STRAW (A) YELLOW   APPearance CLEAR CLEAR   Specific Gravity, Urine 1.006 1.005 - 1.030   pH 5.0 5.0 - 8.0   Glucose, UA NEGATIVE NEGATIVE mg/dL   Hgb urine dipstick MODERATE (A) NEGATIVE   Bilirubin Urine NEGATIVE NEGATIVE   Ketones, ur 5 (A) NEGATIVE mg/dL   Protein, ur NEGATIVE NEGATIVE mg/dL   Nitrite NEGATIVE NEGATIVE   Leukocytes, UA NEGATIVE NEGATIVE   RBC / HPF 0-5 0 - 5 RBC/hpf   WBC, UA 0-5 0 - 5 WBC/hpf   Bacteria, UA RARE (A) NONE SEEN   Squamous Epithelial / LPF 0-5 (A) NONE SEEN   Mucus PRESENT   Osmolality, urine     Status: None   Collection Time: 10/13/16  6:21 PM  Result Value Ref Range   Osmolality, Ur 313 300 - 900 mOsm/kg  Sodium, urine, random     Status: None   Collection Time:  10/13/16  6:21 PM  Result Value Ref Range   Sodium, Ur 77 mmol/L  Glucose, capillary     Status: Abnormal   Collection Time: 10/13/16  9:21 PM  Result Value Ref Range   Glucose-Capillary 223 (H) 65 - 99 mg/dL   Recent Results (from the past 240 hour(s))  Urine culture     Status: Abnormal (Preliminary result)   Collection Time: 10/11/16  7:11 AM  Result Value Ref Range Status   Specimen Description URINE, CLEAN CATCH  Final   Special Requests Normal  Final   Culture (A)  Final    >=100,000 COLONIES/mL KLEBSIELLA PNEUMONIAE >=100,000 COLONIES/mL ESCHERICHIA COLI  Report Status PENDING  Incomplete  MRSA PCR Screening     Status: None   Collection Time: 10/11/16  4:35 PM  Result Value Ref Range Status   MRSA by PCR NEGATIVE NEGATIVE Final    Comment:        The GeneXpert MRSA Assay (FDA approved for NASAL specimens only), is one component of a comprehensive MRSA colonization surveillance program. It is not intended to diagnose MRSA infection nor to guide or monitor treatment for MRSA infections.   Culture, blood (Routine X 2) w Reflex to ID Panel     Status: None (Preliminary result)   Collection Time: 10/12/16  6:53 AM  Result Value Ref Range Status   Specimen Description BLOOD RIGHT HAND  Final   Special Requests   Final    BOTTLES DRAWN AEROBIC AND ANAEROBIC Blood Culture results may not be optimal due to an inadequate volume of blood received in culture bottles   Culture NO GROWTH < 24 HOURS  Final   Report Status PENDING  Incomplete  Culture, blood (Routine X 2) w Reflex to ID Panel     Status: None (Preliminary result)   Collection Time: 10/12/16  6:53 AM  Result Value Ref Range Status   Specimen Description BLOOD LEFT HAND  Final   Special Requests   Final    BOTTLES DRAWN AEROBIC AND ANAEROBIC Blood Culture results may not be optimal due to an inadequate volume of blood received in culture bottles   Culture NO GROWTH < 24 HOURS  Final   Report Status PENDING   Incomplete   Creatinine:  Recent Labs  10/11/16 0711 10/11/16 1237 10/12/16 0138 10/12/16 0455 10/13/16 0452 10/13/16 0829  CREATININE 0.88 0.99 1.03* 1.03* 0.80 0.70   Baseline Creatinine: 0.8  Impression/Assessment:  65yo with right ureteral calculus and UTI  Plan:  1. I discussed the treatment options including medical expulsive therapy, ureteral stent placement and nephrostomy tube placement. Since the pt is asymptomatic she may have passed her calculus. I recommend renal US and KUB to assess for passage of the ureteral calculus. The patient is currently very high risk for further cardiac events with any surgical procedure.  2. UTI: Continue cipro pending urine culture.   Nicolette Bang 10/13/2016, 10:29 PM

## 2016-10-13 NOTE — Progress Notes (Signed)
Pt. A&O x 4, but forgetful and anxious throughout evening.  Restarted pt.'s home dose of xanax, RN attempted to promote sleep and comfort- but pt. Did not sleep at all overnight. Around 0300 pt. Became SOB with bilateral crackles and requiring 5 L [place] instead of 2 L. Lasix given pt. Put out 1.6 L after lasix, over 2.5 L for shift.  Pt. CBG's elevated at 0000 check and 0400, NP aware- waiting on diabetes coordinator to come in the AM and work on insulin pump. Heparin gtt titrated to 9.5 and bolus given after AM labs. Had to redraw BMP, waiting for results Report given to [place].

## 2016-10-13 NOTE — Progress Notes (Signed)
   Signed out to [employer]. Discussed need for patient to be assessed upon her arrival at [facility]. H&P is complete and orders are pended. Advised [facility] team to assess sodium level with possible need for tolvaptan given her volume overload and low sodium. She was + 4.5 L  On 9/12. Call with questions.

## 2016-10-14 ENCOUNTER — Inpatient Hospital Stay (HOSPITAL_COMMUNITY): Payer: Managed Care, Other (non HMO)

## 2016-10-14 ENCOUNTER — Other Ambulatory Visit: Payer: Self-pay | Admitting: *Deleted

## 2016-10-14 ENCOUNTER — Encounter (HOSPITAL_COMMUNITY): Payer: Self-pay | Admitting: Pulmonary Disease

## 2016-10-14 DIAGNOSIS — J9600 Acute respiratory failure, unspecified whether with hypoxia or hypercapnia: Secondary | ICD-10-CM

## 2016-10-14 DIAGNOSIS — K7200 Acute and subacute hepatic failure without coma: Secondary | ICD-10-CM

## 2016-10-14 DIAGNOSIS — I2510 Atherosclerotic heart disease of native coronary artery without angina pectoris: Secondary | ICD-10-CM

## 2016-10-14 DIAGNOSIS — J9601 Acute respiratory failure with hypoxia: Secondary | ICD-10-CM

## 2016-10-14 DIAGNOSIS — R570 Cardiogenic shock: Secondary | ICD-10-CM

## 2016-10-14 DIAGNOSIS — I469 Cardiac arrest, cause unspecified: Secondary | ICD-10-CM

## 2016-10-14 DIAGNOSIS — D72829 Elevated white blood cell count, unspecified: Secondary | ICD-10-CM

## 2016-10-14 DIAGNOSIS — I4901 Ventricular fibrillation: Secondary | ICD-10-CM

## 2016-10-14 DIAGNOSIS — N200 Calculus of kidney: Secondary | ICD-10-CM

## 2016-10-14 DIAGNOSIS — N201 Calculus of ureter: Secondary | ICD-10-CM

## 2016-10-14 DIAGNOSIS — I214 Non-ST elevation (NSTEMI) myocardial infarction: Secondary | ICD-10-CM

## 2016-10-14 DIAGNOSIS — E871 Hypo-osmolality and hyponatremia: Secondary | ICD-10-CM

## 2016-10-14 LAB — GLUCOSE, CAPILLARY
GLUCOSE-CAPILLARY: 282 mg/dL — AB (ref 65–99)
GLUCOSE-CAPILLARY: 241 mg/dL — AB (ref 65–99)
GLUCOSE-CAPILLARY: 239 mg/dL — AB (ref 65–99)
Glucose-Capillary: 225 mg/dL — ABNORMAL HIGH (ref 65–99)
Glucose-Capillary: 179 mg/dL — ABNORMAL HIGH (ref 65–99)

## 2016-10-14 LAB — CBC
HCT: 29.4 % — ABNORMAL LOW (ref 36.0–46.0)
HEMOGLOBIN: 9.9 g/dL — AB (ref 12.0–15.0)
MCH: 26.8 pg (ref 26.0–34.0)
MCHC: 33.7 g/dL (ref 30.0–36.0)
MCV: 79.7 fL (ref 78.0–100.0)
Platelets: 216 10*3/uL (ref 150–400)
RBC: 3.69 MIL/uL — ABNORMAL LOW (ref 3.87–5.11)
RDW: 13.5 % (ref 11.5–15.5)
WBC: 27.2 10*3/uL — AB (ref 4.0–10.5)

## 2016-10-14 LAB — HEPARIN LEVEL (UNFRACTIONATED)
HEPARIN UNFRACTIONATED: 0.21 [IU]/mL — AB (ref 0.30–0.70)
HEPARIN UNFRACTIONATED: 0.32 [IU]/mL (ref 0.30–0.70)
Heparin Unfractionated: 0.16 IU/mL — ABNORMAL LOW (ref 0.30–0.70)

## 2016-10-14 LAB — SPIROMETRY WITH GRAPH
FEF 25-75 Pre: 0.86 L/sec
FEF2575-%Pred-Pre: 41 %
FEV1-%Pred-Pre: 34 %
FEV1-Pre: 0.79 L
FEV1FVC-%Pred-Pre: 108 %
FEV6-%Pred-Pre: 32 %
FEV6-Pre: 0.95 L
FEV6FVC-%Pred-Pre: 104 %
FVC-%Pred-Pre: 31 %
FVC-Pre: 0.95 L
Pre FEV1/FVC ratio: 84 %
Pre FEV6/FVC Ratio: 100 %

## 2016-10-14 LAB — BASIC METABOLIC PANEL
Anion gap: 9 (ref 5–15)
BUN: 10 mg/dL (ref 6–20)
CALCIUM: 6.8 mg/dL — AB (ref 8.9–10.3)
CO2: 25 mmol/L (ref 22–32)
CREATININE: 0.59 mg/dL (ref 0.44–1.00)
Chloride: 90 mmol/L — ABNORMAL LOW (ref 101–111)
GFR calc Af Amer: >60 mL/min (ref >60)
GLUCOSE: 185 mg/dL — AB (ref 65–99)
POTASSIUM: 3.4 mmol/L — AB (ref 3.5–5.1)
SODIUM: 124 mmol/L — AB (ref 135–145)

## 2016-10-14 LAB — COMPREHENSIVE METABOLIC PANEL
ALT: 55 U/L — ABNORMAL HIGH (ref 14–54)
ANION GAP: 13 (ref 5–15)
AST: 87 U/L — AB (ref 15–41)
Albumin: 2.6 g/dL — ABNORMAL LOW (ref 3.5–5.0)
Alkaline Phosphatase: 75 U/L (ref 38–126)
BUN: 12 mg/dL (ref 6–20)
CO2: 22 mmol/L (ref 22–32)
Calcium: 6.6 mg/dL — ABNORMAL LOW (ref 8.9–10.3)
Chloride: 86 mmol/L — ABNORMAL LOW (ref 101–111)
Creatinine, Ser: 0.84 mg/dL (ref 0.44–1.00)
GFR calc Af Amer: >60 mL/min (ref >60)
GFR calc non Af Amer: >60 mL/min (ref >60)
GLUCOSE: 245 mg/dL — AB (ref 65–99)
POTASSIUM: 3 mmol/L — AB (ref 3.5–5.1)
SODIUM: 121 mmol/L — AB (ref 135–145)
TOTAL PROTEIN: 5.6 g/dL — AB (ref 6.5–8.1)
Total Bilirubin: 1.3 mg/dL — ABNORMAL HIGH (ref 0.3–1.2)

## 2016-10-14 LAB — PROTIME-INR
INR: 1.08
PROTHROMBIN TIME: 13.9 s (ref 11.4–15.2)

## 2016-10-14 LAB — URINE CULTURE: Special Requests: NORMAL

## 2016-10-14 LAB — PROCALCITONIN: Procalcitonin: 11.78 ng/mL

## 2016-10-14 LAB — MAGNESIUM
MAGNESIUM: 1.8 mg/dL (ref 1.7–2.4)
MAGNESIUM: 1.7 mg/dL (ref 1.7–2.4)

## 2016-10-14 LAB — BRAIN NATRIURETIC PEPTIDE: B NATRIURETIC PEPTIDE 5: 129.5 pg/mL — AB (ref 0.0–100.0)

## 2016-10-14 LAB — HIV ANTIBODY (ROUTINE TESTING W REFLEX): HIV Screen 4th Generation wRfx: NONREACTIVE

## 2016-10-14 MED ORDER — POTASSIUM CHLORIDE CRYS ER 20 MEQ PO TBCR
40.0000 meq | EXTENDED_RELEASE_TABLET | Freq: Once | ORAL | Status: AC
  Administered 2016-10-14: 40 meq via ORAL
  Filled 2016-10-14: qty 2

## 2016-10-14 MED ORDER — SODIUM CHLORIDE 0.9% FLUSH
10.0000 mL | Freq: Two times a day (BID) | INTRAVENOUS | Status: DC
  Administered 2016-10-14 (×2): 10 mL

## 2016-10-14 MED ORDER — SODIUM CHLORIDE 0.9 % IV SOLN
1.0000 g | Freq: Once | INTRAVENOUS | Status: AC
  Administered 2016-10-14: 1 g via INTRAVENOUS
  Filled 2016-10-14: qty 10

## 2016-10-14 MED ORDER — SODIUM CHLORIDE 0.9% FLUSH: 10.0000 mL | INTRAVENOUS | Status: DC | PRN

## 2016-10-14 MED ORDER — CHLORHEXIDINE GLUCONATE CLOTH 2 % EX PADS
6.0000 | MEDICATED_PAD | Freq: Every day | CUTANEOUS | Status: DC
  Administered 2016-10-14: 6 via TOPICAL

## 2016-10-14 MED ORDER — FUROSEMIDE 10 MG/ML IJ SOLN: 20.0000 mg | Freq: Every day | INTRAMUSCULAR | Status: DC

## 2016-10-14 MED ORDER — ALPRAZOLAM 0.5 MG PO TABS
0.5000 mg | ORAL_TABLET | Freq: Three times a day (TID) | ORAL | Status: DC | PRN
  Administered 2016-10-14 – 2016-10-16 (×5): 0.5 mg via ORAL
  Filled 2016-10-14 (×6): qty 1

## 2016-10-14 MED ORDER — ORAL CARE MOUTH RINSE
15.0000 mL | Freq: Two times a day (BID) | OROMUCOSAL | Status: DC
  Administered 2016-10-15 – 2016-10-20 (×9): 15 mL via OROMUCOSAL

## 2016-10-14 MED ORDER — POTASSIUM CHLORIDE 20 MEQ/15ML (10%) PO SOLN
40.0000 meq | Freq: Once | ORAL | Status: DC
  Filled 2016-10-14: qty 30

## 2016-10-14 MED ORDER — HEPARIN BOLUS VIA INFUSION
1900.0000 [IU] | Freq: Once | INTRAVENOUS | Status: AC
  Administered 2016-10-14: 1900 [IU] via INTRAVENOUS
  Filled 2016-10-14: qty 1900

## 2016-10-14 MED ORDER — DEXTROSE 5 % IV SOLN
3.0000 g | Freq: Once | INTRAVENOUS | Status: AC
  Administered 2016-10-14: 3 g via INTRAVENOUS
  Filled 2016-10-14: qty 6

## 2016-10-14 MED ORDER — POTASSIUM CHLORIDE 10 MEQ/100ML IV SOLN
10.0000 meq | INTRAVENOUS | Status: AC
  Administered 2016-10-14 (×2): 10 meq via INTRAVENOUS
  Filled 2016-10-14 (×3): qty 100

## 2016-10-14 MED ORDER — FUROSEMIDE 10 MG/ML IJ SOLN
40.0000 mg | Freq: Once | INTRAMUSCULAR | Status: AC
  Administered 2016-10-14: 40 mg via INTRAVENOUS
  Filled 2016-10-14: qty 4

## 2016-10-14 MED ORDER — BENZONATATE 100 MG PO CAPS
100.0000 mg | ORAL_CAPSULE | Freq: Three times a day (TID) | ORAL | Status: DC
  Administered 2016-10-14 – 2016-10-18 (×12): 100 mg via ORAL
  Filled 2016-10-14 (×12): qty 1

## 2016-10-14 MED ORDER — FUROSEMIDE 10 MG/ML IJ SOLN
20.0000 mg | Freq: Once | INTRAMUSCULAR | Status: AC
  Administered 2016-10-14: 20 mg via INTRAVENOUS
  Filled 2016-10-14: qty 2

## 2016-10-14 NOTE — Progress Notes (Signed)
Spoke with [facility], [place] cards service. PA to reach out to Dr. Loletha Grayer regarding seeing pt this AM for POC review. Pt and family updated. Will continue to monitor.

## 2016-10-14 NOTE — Progress Notes (Signed)
Patient awoke suddenly complaining of shortness of breath. Pt O2 sats were 88 and respirations 36.  Patient was on 6L [place] and RN placed patient on 15L NRB. Upon assessment, pt lungs have crackles bilaterally in anterior and posterior lung fields.  She appears anxious and exhausted, but she is oriented x4. HOB raised >30 to promote breathing.  MD cardiology notified to call RN regarding change in patient condition.  RN explained to MD about the situation (possible anxiety or flash pulmonary edema).    Cardiology ordered STAT chest Xray, and told this RN to have CCM in house to lay eyes on the patient and determine from there what to do with patient's respiratory status.   Patient resting more comfortably now sat 99%, Resp 22. (0145).  Awaiting CCM to come assess patient.

## 2016-10-14 NOTE — Care Management Note (Signed)
Case Management Note Marvetta Gibbons RN, BSN Unit 4E-Case Manager-- [place] coverage [phone]  Patient Details  Name: Lisa [name] MRN: [id number] Date of Birth: [birth year]  Subjective/Objective:    Pt presented to [facility] on 9/11 with flank pain and was found to have nephrolithiasis with possible UTI. Immediately after receiving IV Rocephin in the ED, patient started with seizure-like activity and suffered Vfib arrest.- tx to [facility] for further care Currently on HFNC               Action/Plan: PTA pt lived at home- CM to follow for d/c needs  Expected Discharge Date:                  Expected Discharge Plan:     In-House Referral:     Discharge planning Services  CM Consult  Post Acute Care Choice:    Choice offered to:     DME Arranged:    DME Agency:     HH Arranged:    [place] Agency:     Status of Service:  In process, will continue to follow  If discussed at Long Length of Stay Meetings, dates discussed:    Discharge Disposition:   Additional Comments:  Dawayne Patricia, RN 10/14/2016, 8:49 AM

## 2016-10-14 NOTE — Progress Notes (Addendum)
Inpatient Diabetes Program Recommendations  AACE/ADA: New Consensus Statement on Inpatient Glycemic Control (2015)  Target Ranges:  Prepandial:   less than 140 mg/dL      Peak postprandial:   less than 180 mg/dL (1-2 hours)      Critically ill patients:  140 - 180 mg/dL   Lab Results  Component Value Date   GLUCAP 282 (H) 10/14/2016   HGBA1C 7.0 (H) 10/12/2016    Review of Glycemic Control  Results for NAIOMY, WATTERS (MRN [id number]) as of 10/14/2016 08:18  Ref. Range 10/13/2016 09:34 10/13/2016 11:46 10/13/2016 16:14 10/13/2016 21:21 10/14/2016 07:55  Glucose-Capillary Latest Ref Range: 65 - 99 mg/dL 273 (H) 195 (H) 197 (H) 223 (H) 282 (H)    Diabetes history:DM1 Outpatient Diabetes medications: Assessed insulin pump settings. Patient has medtronic insulin pump. Settings are: Basal: 12 AM-0.525 units/hr 3 AM- 0.625 units/hr 5 AM-0.95 units/hr 8AM-0.575 units/hr 2 PM- 0.625 units/hr 8 PM- 0.725 units/hr Total Basal=15.775 units/24 hours  Bolus: 1 unit/12 grams of CHO and 1 unit/14 grams of CHO 1 unit drops CBG approximately 50 mg/dL  Current orders for Inpatient glycemic control: Lantus 18 units daily, Novolog 0-5 units Q4H, Novolog 3 units TID with meals for meal coverage  Inpatient Diabetes Program Recommendations: Noted increase in Lantus to begin this am  Consider adding Novolog 0-5 units QHS.  Please consider increasing meal coverage to Novolog 5 units TID with meals if patient eats at least 50% of meals.   Gentry Fitz, RN, BA, MHA, CDE Diabetes Coordinator Inpatient Diabetes Program  [phone] (Team Pager) [phone] ([place]) 10/14/2016 8:19 AM

## 2016-10-14 NOTE — Progress Notes (Addendum)
ANTICOAGULATION CONSULT NOTE - Follow Up Consult  Pharmacy Consult for Heparin Indication: severe multivessel CAD  Allergies  Allergen Reactions  . Ceftriaxone     Anaphylaxis, cardiac arrest    Patient Measurements: Height: 5\' 3"  (160 cm) Weight: 140 lb 14 oz (63.9 kg) IBW/kg (Calculated) : 52.4  Vital Signs: Temp: 97.8 F (36.6 C) (09/14 2008) Temp Source: Oral (09/14 2008) BP: 149/71 (09/14 2200) Pulse Rate: 115 (09/14 2200)  Labs:  Recent Labs  10/12/16 0455 10/12/16 9604  10/13/16 0452  10/13/16 0829  10/14/16 0442 10/14/16 1347 10/14/16 2157  HGB 12.7 13.3  --  10.7*  --   --   --  9.9*  --   --   HCT 38.6 41.1  --  31.4*  --   --   --  29.4*  --   --   PLT 329 348  --  241  --   --   --  216  --   --   LABPROT  --   --   --   --   --   --   --  13.9  --   --   INR  --   --   --   --   --   --   --  1.08  --   --   HEPARINUNFRC  --  0.51  < >  --   < >  --   < > 0.21* 0.16* 0.32  CREATININE 1.03*  --   --  0.80  --  0.70  --  0.84 0.59  --   TROPONINI 3.19*  --   --   --   --   --   --   --   --   --   < > = values in this interval not displayed.  Estimated Creatinine Clearance: 63.9 mL/min (by C-G formula based on SCr of 0.59 mg/dL).   Medications:  Heparin @ 1250 units/hr  Assessment: 65yof s/p cardiac arrest continues on heparin for severe multivessel CAD awaiting CABG next week. Heparin level is therapeutic at 0.32.   Goal of Therapy:  Heparin level 0.3-0.7 units/ml Monitor platelets by anticoagulation protocol: Yes   Plan:  1) Continue heparin at 1250 units/hr 2) Follow up daily heparin level and CBC  Deboraha Sprang 10/14/2016,11:22 PM    ADDENDUM: Daily heparin level is slightly below goal at 0.26. Increase heparin to 1400 units/hr and check level in 6 hours.   Deboraha Sprang 10/14/2016, 4:34 AM

## 2016-10-14 NOTE — Progress Notes (Signed)
Patient refused 1600 insulin. CBG rechecked at 2000 even though patient is ACHS. CBG at this time is 241. Per Devineni, MD give sliding scale of 3 units of Insulin and and recheck blood sugar an hour later.

## 2016-10-14 NOTE — Progress Notes (Signed)
Nutrition Brief Note  Patient identified on the Malnutrition Screening Tool (MST) Report  Pt seen at [facility] by RD 9/12 with no previous nutrition issues. Noted pt transferred to [facility] 9/13 after cardiac arrest (intubation < 24 hrs) for CVTS eval for possible CABG. Pt extubated alert, talking with family at bedside.  Pt with hx of DM1 on insulin pump at home but currently on SSI/lantus here.  Pt on IV lasix which accounts for weight loss over last couple of days.   Wt Readings from Last 15 Encounters:  10/14/16 140 lb 14 oz (63.9 kg)  10/11/16 149 lb 0.5 oz (67.6 kg)    Body mass index is 24.95 kg/m.   Current diet order is NPO. Labs and medications reviewed.   No nutrition interventions warranted at this time. If nutrition issues arise, please consult RD.  Monitor for diet advancement and needs post CABG.   [place], [place], [place] Pager [phone] After Hours Pager

## 2016-10-14 NOTE — Progress Notes (Signed)
Patient ID: Lisa [name], female   DOB: 11-06-1950, 66 y.o.   MRN: [id number]  PROGRESS NOTE    Lisa [name]  DPO:[id number] DOB: [birth year] DOA: 10/13/2016 PCP: Lenard Simmer, MD   Brief Narrative: 66 year old female was transferred from [facility] on 10/13/2016 after being initially admitted there on 10/11/2016 with flank pain secondary to UTI and nephrolithiasis for which patient received IV Rocephin and add V. fib arrest afterwards. She was intubated and placed on hypothermia protocol, had cardiac catheterization which showed severe 2 vessel coronary artery disease. She was subsequently extubated. Urology as evaluated the patient as well who is planning to repeat renal ultrasound. Patient is currently on ciprofloxacin for UTI. Patient was transferred to [facility] on 10/13/2016 for CVTS  evaluation for probable CABG. Hospitalist service was consulted for medical management. Patient has been intermittently hypoxic requiring higher oxygen requirement and has received a few doses of Lasix as well.  Assessment & Plan:   Principal Problem:   Cardiac arrest with ventricular fibrillation (HCC) Active Problems:   Non-ST elevation (NSTEMI) myocardial infarction (HCC)   Leukocytosis   Respiratory failure (HCC)   Cardiogenic shock (HCC)   Anaphylaxis   Left ureteral stone   Shock liver   Hypomagnesemia   Ventricular fibrillation (HCC)   Hyponatremia   Acute respiratory failure (HCC)   Coronary artery disease involving native heart without angina pectoris   Kidney stone   Acute respiratory failure -Patient still requiring intermittently high oxygen requirement. Pulmonary has been consulted the primary team.  - Patient is getting intermittent Lasix.  - wean O2 as tolerated -Defer antibiotic management for pulmonary team as chest x-ray still showing opacities secondary to pulmonary edema versus consolidation.  Cardiac arrest due to v fib due to  anaphylaxis -avoid penicillin/cephalosporins - plan for eval by CVTS for 2 vessel disease found on cath  2 vessel coronary artery disease - Management as per primary cardiology team. CVTS evaluation is pending  UTI -Urine culture is growing Escherichia coli/Klebsiella. Continue oral ciprofloxacin  Right ureteral calculus - Urology following and is planning repeat renal ultrasound  Hyponatremia -suspect from volume overload vs excessive PO water intake -Trend sodium. If sodium level does not improve, consider nephrology evaluation  DM type 1 -uses pump at home -here will use lantus and SSI/novolog  Shock liver -LFTs improving  Volume overload -x ray consistent with fluid -IV lasix and monitor output  Leukocytosis -trending down -?stress  Hypokalemia - Being replaced.  Hypomagnesemia - Improved    Subjective: Patient seen and examined at bedside. She feels slightly anxious and is short of breath. No current chest pain, nausea or vomiting.  Objective: Vitals:   10/14/16 1223 10/14/16 1300 10/14/16 1429 10/14/16 1430  BP:  (!) 170/70 (!) 142/63   Pulse:  (!) 111 (!) 106   Resp:  (!) 30 (!) 24   Temp: 98.7 F (37.1 C)     TempSrc: Oral     SpO2:  97% 99% 99%  Weight:      Height:        Intake/Output Summary (Last 24 hours) at 10/14/16 1437 Last data filed at 10/14/16 0910  Gross per 24 hour  Intake          1152.92 ml  Output             5195 ml  Net         -4042.08 ml   Filed Weights   10/13/16 1100 10/14/16  0500  Weight: 67.5 kg (148 lb 13 oz) 63.9 kg (140 lb 14 oz)    Examination:  General exam: Appears Slightly uncomfortable   Respiratory system: Bilateral decreased breath sound at bases with scattered crackles Cardiovascular system: S1 & S2 heard, intermittently tachycardic  Gastrointestinal system: Abdomen is nondistended, soft and nontender. Normal bowel sounds heard. Extremities: No cyanosis, clubbing, edema    Data Reviewed: I  have personally reviewed following labs and imaging studies  CBC:  Recent Labs Lab 10/11/16 0711 10/12/16 0455 10/12/16 0653 10/13/16 0452 10/14/16 0442  WBC 13.5* 51.8* 51.3* 28.3* 27.2*  NEUTROABS 11.5*  --  46.8*  --   --   HGB 12.8 12.7 13.3 10.7* 9.9*  HCT 38.0 38.6 41.1 31.4* 29.4*  MCV 82.3 84.7 85.1 83.1 79.7  PLT 353 329 348 241 315   Basic Metabolic Panel:  Recent Labs Lab 10/11/16 1651 10/12/16 0138 10/12/16 0455 10/13/16 0452 10/13/16 0829 10/13/16 1217 10/13/16 1500 10/14/16 0442  NA  --  134* 135 125* 123*  --   --  121*  K  --  4.6 4.5 3.7 3.4*  --   --  3.0*  CL  --  109 107 96* 94*  --   --  86*  CO2  --  20* 21* 21* 20*  --   --  22  GLUCOSE  --  246* 194* 252* 240*  --   --  245*  BUN  --  21* 22* 20 18  --   --  12  CREATININE  --  1.03* 1.03* 0.80 0.70  --   --  0.84  CALCIUM  --  6.7* 6.7* 6.6* 6.4*  --   --  6.6*  MG 1.8  --   --  1.1*  --  1.6* 2.3 1.8  PHOS 3.3  --   --   --   --   --   --   --    GFR: Estimated Creatinine Clearance: 60.9 mL/min (by C-G formula based on SCr of 0.84 mg/dL). Liver Function Tests:  Recent Labs Lab 10/11/16 0711 10/13/16 0452 10/14/16 0442  AST 22 133* 87*  ALT 21 64* 55*  ALKPHOS 80 71 75  BILITOT 0.6 0.9 1.3*  PROT 7.5 5.3* 5.6*  ALBUMIN 3.8 2.6* 2.6*    Recent Labs Lab 10/11/16 0711  LIPASE 18   No results for input(s): AMMONIA in the last 168 hours. Coagulation Profile:  Recent Labs Lab 10/11/16 1237 10/14/16 0442  INR 1.72 1.08   Cardiac Enzymes:  Recent Labs Lab 10/11/16 1237 10/11/16 1651 10/11/16 2306 10/12/16 0455  TROPONINI 7.82* 7.77* 5.68* 3.19*   BNP (last 3 results) No results for input(s): PROBNP in the last 8760 hours. HbA1C:  Recent Labs  10/12/16 0455  HGBA1C 7.0*   CBG:  Recent Labs Lab 10/13/16 1146 10/13/16 1614 10/13/16 2121 10/14/16 0755 10/14/16 1225  GLUCAP 195* 197* 223* 282* 225*   Lipid Profile:  Recent Labs  10/11/16 1651  10/13/16 0452  CHOL  --  81  HDL  --  34*  LDLCALC  --  36  TRIG 66 56  CHOLHDL  --  2.4   Thyroid Function Tests:  Recent Labs  10/12/16 1319  TSH 1.154   Anemia Panel: No results for input(s): VITAMINB12, FOLATE, FERRITIN, TIBC, IRON, RETICCTPCT in the last 72 hours. Sepsis Labs:  Recent Labs Lab 10/13/16 1446 10/14/16 0442  PROCALCITON 20.66 11.78    Recent Results (from the  past 240 hour(s))  Urine culture     Status: Abnormal   Collection Time: 10/11/16  7:11 AM  Result Value Ref Range Status   Specimen Description URINE, CLEAN CATCH  Final   Special Requests Normal  Final   Culture (A)  Final    >=100,000 COLONIES/mL KLEBSIELLA PNEUMONIAE >=100,000 COLONIES/mL ESCHERICHIA COLI    Report Status 10/14/2016 FINAL  Final   Organism ID, Bacteria KLEBSIELLA PNEUMONIAE (A)  Final   Organism ID, Bacteria ESCHERICHIA COLI (A)  Final      Susceptibility   Escherichia coli - MIC*    AMPICILLIN >=32 RESISTANT Resistant     CEFAZOLIN <=4 SENSITIVE Sensitive     CEFTRIAXONE <=1 SENSITIVE Sensitive     CIPROFLOXACIN <=0.25 SENSITIVE Sensitive     GENTAMICIN <=1 SENSITIVE Sensitive     IMIPENEM <=0.25 SENSITIVE Sensitive     NITROFURANTOIN <=16 SENSITIVE Sensitive     TRIMETH/SULFA <=20 SENSITIVE Sensitive     AMPICILLIN/SULBACTAM >=32 RESISTANT Resistant     PIP/TAZO <=4 SENSITIVE Sensitive     Extended ESBL NEGATIVE Sensitive     * >=100,000 COLONIES/mL ESCHERICHIA COLI   Klebsiella pneumoniae - MIC*    AMPICILLIN >=32 RESISTANT Resistant     CEFAZOLIN <=4 SENSITIVE Sensitive     CEFTRIAXONE <=1 SENSITIVE Sensitive     CIPROFLOXACIN <=0.25 SENSITIVE Sensitive     GENTAMICIN <=1 SENSITIVE Sensitive     IMIPENEM <=0.25 SENSITIVE Sensitive     NITROFURANTOIN 64 INTERMEDIATE Intermediate     TRIMETH/SULFA <=20 SENSITIVE Sensitive     AMPICILLIN/SULBACTAM 4 SENSITIVE Sensitive     PIP/TAZO <=4 SENSITIVE Sensitive     Extended ESBL NEGATIVE Sensitive     * >=100,000  COLONIES/mL KLEBSIELLA PNEUMONIAE  MRSA PCR Screening     Status: None   Collection Time: 10/11/16  4:35 PM  Result Value Ref Range Status   MRSA by PCR NEGATIVE NEGATIVE Final    Comment:        The GeneXpert MRSA Assay (FDA approved for NASAL specimens only), is one component of a comprehensive MRSA colonization surveillance program. It is not intended to diagnose MRSA infection nor to guide or monitor treatment for MRSA infections.   Culture, blood (Routine X 2) w Reflex to ID Panel     Status: None (Preliminary result)   Collection Time: 10/12/16  6:53 AM  Result Value Ref Range Status   Specimen Description BLOOD RIGHT HAND  Final   Special Requests   Final    BOTTLES DRAWN AEROBIC AND ANAEROBIC Blood Culture results may not be optimal due to an inadequate volume of blood received in culture bottles   Culture NO GROWTH 2 DAYS  Final   Report Status PENDING  Incomplete  Culture, blood (Routine X 2) w Reflex to ID Panel     Status: None (Preliminary result)   Collection Time: 10/12/16  6:53 AM  Result Value Ref Range Status   Specimen Description BLOOD LEFT HAND  Final   Special Requests   Final    BOTTLES DRAWN AEROBIC AND ANAEROBIC Blood Culture results may not be optimal due to an inadequate volume of blood received in culture bottles   Culture NO GROWTH 2 DAYS  Final   Report Status PENDING  Incomplete         Radiology Studies: US Renal  Result Date: 10/14/2016 CLINICAL DATA:  Kidney stone.  History of cardiac arrest. EXAM: RENAL / URINARY TRACT ULTRASOUND COMPLETE COMPARISON:  Abdominal CT 10/11/2016  FINDINGS: Right Kidney: Length: 11.5 cm. 2.2 cm hilar cyst. No hydronephrosis. Borderline hyperechoic renal cortex. Left Kidney: Length: 12.5 cm. Mild hydronephrosis, improved from CT 3 days ago. No evidence of mass. Bladder: Decompressed by Foley catheter. IMPRESSION: Mild left hydronephrosis, improved from CT 3 days ago. Electronically Signed   By: Monte Fantasia M.D.    On: 10/14/2016 09:05   Dg Chest Port 1 View  Result Date: 10/14/2016 CLINICAL DATA:  66 y/o  F; respiratory distress. EXAM: PORTABLE CHEST 1 VIEW COMPARISON:  10/13/2016 chest radiograph FINDINGS: Asymmetric consolidations of the lungs are stable from prior chest radiographs. Small to moderate right pleural effusion. Stable cardiac silhouette within normal limits. Aortic atherosclerosis with calcification. No acute osseous abnormality. IMPRESSION: Stable asymmetric consolidations throughout the lungs and small to moderate right effusion. Differential includes multifocal pneumonia and pulmonary edema. Electronically Signed   By: Kristine Garbe M.D.   On: 10/14/2016 01:30   Dg Chest Port 1 View  Result Date: 10/13/2016 CLINICAL DATA:  Shortness of breath today EXAM: PORTABLE CHEST 1 VIEW COMPARISON:  Earlier today FINDINGS: Continued progression of bilateral airspace disease, asymmetric to the left. Normal heart size for technique. Negative mediastinal contours. Small if any pleural effusions. No pneumothorax. IMPRESSION: Progression of asymmetric bilateral airspace disease favoring pneumonia or noncardiogenic edema (was there aspiration at time of cardiac arrest?). Electronically Signed   By: Monte Fantasia M.D.   On: 10/13/2016 13:44   Dg Chest Port 1 View  Result Date: 10/13/2016 CLINICAL DATA:  Respiratory failure. EXAM: PORTABLE CHEST 1 VIEW COMPARISON:  10/11/2016. FINDINGS: Interim removal of endotracheal tube and NG tube. Heart size normal. New onset of diffuse bilateral pulmonary interstitial prominence noted. Findings consistent with a process such as interstitial pneumonitis or interstitial edema. Small right pleural effusion. No pneumothorax . IMPRESSION: 1.  Interim removal of endotracheal tube and NG tube. 2. New onset of diffuse bilateral pulmonary interstitial prominence consistent with a process such as interstitial pneumonitis or interstitial edema. Small right pleural  effusion. Electronically Signed   By: Marcello Moores  Register   On: 10/13/2016 06:19   Dg Abd Portable 1v  Result Date: 10/14/2016 CLINICAL DATA:  Ureteral calculus, abdominal pain EXAM: PORTABLE ABDOMEN - 1 VIEW COMPARISON:  CT abdomen and pelvis 10/11/2016 FINDINGS: LEFT pelvic phleboliths again identified. Distal LEFT ureteral calculus seen on prior CT exam is not visualized on current study. RIGHT femoral line noted. Bowel gas pattern normal. Diffuse osseous demineralization. IMPRESSION: Previously identified distal LEFT ureteral calculus is no longer seen. Electronically Signed   By: Lavonia Dana M.D.   On: 10/14/2016 07:40        Scheduled Meds: . ALPRAZolam  0.25 mg Oral Daily  . aspirin EC  81 mg Oral Daily  . atorvastatin  80 mg Oral q1800  . benzonatate  100 mg Oral TID  . Chlorhexidine Gluconate Cloth  6 each Topical Daily  . ciprofloxacin  500 mg Oral BID  . insulin aspart  0-9 Units Subcutaneous TID WC  . insulin aspart  3 Units Subcutaneous TID WC  . insulin glargine  18 Units Subcutaneous Daily  . ipratropium-albuterol  3 mL Nebulization Q6H  . mouth rinse  15 mL Mouth Rinse BID  . sodium chloride flush  10-40 mL Intracatheter Q12H   Continuous Infusions: . heparin 1,050 Units/hr (10/14/16 0535)     LOS: 1 day        Aline August, MD [facility] Pager [phone]  If 7PM-7AM, please contact night-coverage [email or web address]  Password [email or web address] 10/14/2016, 2:37 PM

## 2016-10-14 NOTE — Progress Notes (Signed)
Charge RN paged Dr. Loletha Grayer. Call back received and MD coming to see pt.

## 2016-10-14 NOTE — Consult Note (Signed)
[address]       [place] [address]             [phone]        Lisa [name] [facility] [place] [facility] Medical Record #[id number] Date of Birth: July 19, 1950  Referring: Ashok Norris MD Primary Care: Lenard Simmer, MD  Chief Complaint:   Cardiac arrest  History of Present Illness:     Patient examined, coronary angiogram and echocardiogram and recent chest x-ray images all personally reviewed and counseled with patient  66 year old diabetic smoker with recent diagnosis of severe multivessel CAD. The patient had a V. fib arrest 3 days ago after receiving a dose of ceftriaxone for probable left pyelonephritis associated with a renal calculus. She received chest compressions and DC cardioversion and was intubated and successfully resuscitated. A cardiac catheterization was performed by Dr. Pasty Arch which demonstrated high-grade 95% stenosis of the mid RCA and the circumflex and 50% stenosis of the LAD. LVEDP was mildly elevated. Echocardiogram showed fairly well preserved global LV function without significant valvular disease. The patient was extubated and followed by critical care. Her chest x-ray has appearance of probable ARDS. She is receiving Lasix and IV Cipro. Her urine culture grew out Klebsiella. Current white count is 20,000. She is afebrile. She has not been ambulating. Her chest is sore from CPR. She has an elevated pro calcitonin and a sinus tachycardia. Blood cultures taken recently are negative.  The patient would benefit from multivessel CABG after she recovers from her acute illness of urosepsis and probable ARDS. She should remain hospitalized until surgery which is currently scheduled for September 20.  Current Activity/ Functional Status: Patient had a normal functional level prior to her admission   Zubrod Score: At the time of surgery this patient's most appropriate activity status/level should be described as: []     0    Normal activity, no symptoms []     1     Restricted in physical strenuous activity but ambulatory, able to do out light work [x]     2    Ambulatory and capable of self care, unable to do work activities, up and about                 more than 50%  Of the time                            []     3    Only limited self care, in bed greater than 50% of waking hours []     4    Completely disabled, no self care, confined to bed or chair []     5    Moribund  Past Medical History:  Diagnosis Date  . CAD (coronary artery disease)    a. LHC 10/11/2016: LM 20%, oLAD 40%, p-mLAD 40% eccentric/calcified, dLAD 40%, p-mLCx 95%, eccentric, sev calcified, pRCA 30%, eccentric, sev calcified, mRCA lesion-1 90% focal & eccentric, sev calcified, mRCA lesion-2 99% sev calcified, EF 55-65%  . Nephrolithiasis   . NSTEMI (non-ST elevated myocardial infarction) ([place]) 10/11/2016  . Pneumonia 02/2016  . PVD (peripheral vascular disease) ([place])    a. Abdominal aortogram 10/11/16: Infrarenal abdominal aorta heavily calcified & ectatic with approximately 40-50% stenosis at the bifurcation. Mild diffuse disease w/ heavy calcification noted in the common & external iliac arteries bilaterally  . Type I diabetes mellitus ([place])   . Ventricular fibrillation ([place]) 10/11/2016   a. Felt  to be secondary to demand ischemia in the setting of underlying CAD secondary to anaphylactic reaction from IV Rocephin    Past Surgical History:  Procedure Laterality Date  . ABDOMINAL AORTOGRAM N/A 10/11/2016   Procedure: ABDOMINAL AORTOGRAM;  Surgeon: Nelva Bush, MD;  Location: [facility];  Service: Cardiovascular;  Laterality: N/A;  . LEFT HEART CATH AND CORONARY ANGIOGRAPHY N/A 10/11/2016   Procedure: LEFT HEART CATH AND CORONARY ANGIOGRAPHY;  Surgeon: Nelva Bush, MD;  Location: [facility];  Service: Cardiovascular;  Laterality: N/A;  . TONSILLECTOMY      History  Smoking Status  . Current Every Day Smoker  . Packs/day: 0.50  . Years: 47.00  .  Types: Cigarettes  . Start date: 01/25/1969  Smokeless Tobacco  . Never Used    Comment: Second-hand exposure through her husband & father as well.    History  Alcohol Use  . 0.0 oz/week    Social History   Social History  . Marital status: Married    Spouse name: N/A  . Number of children: N/A  . Years of education: N/A   Occupational History  . Not on file.   Social History Main Topics  . Smoking status: Current Every Day Smoker    Packs/day: 0.50    Years: 47.00    Types: Cigarettes    Start date: 01/25/1969  . Smokeless tobacco: Never Used     Comment: Second-hand exposure through her husband & father as well.  . Alcohol use 0.0 oz/week  . Drug use: No  . Sexual activity: Not Currently   Other Topics Concern  . Not on file   Social History Narrative   [facility] (10/14/16):   Patient has primarily done office work. Married for approximately 1 year. Has a dog at home but no bird exposure. No mold exposure.    Allergies  Allergen Reactions  . Ceftriaxone     Anaphylaxis, cardiac arrest    Current Facility-Administered Medications  Medication Dose Route Frequency Provider Last Rate Last Dose  . acetaminophen (TYLENOL) tablet 650 mg  650 mg Oral Q4H PRN Rise Mu, PA-C      . ALPRAZolam Duanne Moron) tablet 0.25 mg  0.25 mg Oral Daily Christell Faith M, PA-C   0.25 mg at 10/14/16 0849  . ALPRAZolam Duanne Moron) tablet 0.5 mg  0.5 mg Oral TID PRN Sueanne Margarita, MD   0.5 mg at 10/14/16 1351  . aspirin EC tablet 81 mg  81 mg Oral Daily Christell Faith M, PA-C   81 mg at 10/14/16 0850  . atorvastatin (LIPITOR) tablet 80 mg  80 mg Oral q1800 Rise Mu, PA-C   80 mg at 10/13/16 1801  . benzonatate (TESSALON) capsule 100 mg  100 mg Oral TID Javier Glazier, MD   100 mg at 10/14/16 1336  . Chlorhexidine Gluconate Cloth 2 % PADS 6 each  6 each Topical Daily Sueanne Margarita, MD   6 each at 10/14/16 1600  . ciprofloxacin (CIPRO) tablet 500 mg  500 mg Oral BID Christell Faith M,  PA-C   500 mg at 10/14/16 0849  . [START ON 10/15/2016] furosemide (LASIX) injection 20 mg  20 mg Intravenous Daily [place]cott Gum, Collier Salina, MD      . heparin ADULT infusion 100 units/mL (25000 units/220mL sodium chloride 0.45%)  1,250 Units/hr Intravenous Continuous Leroy Libman, RPH 12.5 mL/hr at 10/14/16 1530 1,250 Units/hr at 10/14/16 1530  . insulin aspart (novoLOG) injection 0-9 Units  0-9 Units Subcutaneous TID WC Vann, Jessica U, DO   3 Units at 10/14/16 1335  . insulin aspart (novoLOG) injection 3 Units  3 Units Subcutaneous TID WC Vann, Jessica U, DO      . insulin glargine (LANTUS) injection 18 Units  18 Units Subcutaneous Daily Eulogio Bear U, DO   18 Units at 10/14/16 0849  . ipratropium-albuterol (DUONEB) 0.5-2.5 (3) MG/3ML nebulizer solution 3 mL  3 mL Nebulization Q6H Dunn, Ryan M, PA-C   3 mL at 10/14/16 1430  . MEDLINE mouth rinse  15 mL Mouth Rinse BID Turner, Traci R, MD      . morphine 4 MG/ML injection 2 mg  2 mg Intravenous Q2H PRN Rise Mu, PA-C   2 mg at 10/14/16 1531  . nitroGLYCERIN (NITROSTAT) SL tablet 0.4 mg  0.4 mg Sublingual Q5 Min x 3 PRN Christell Faith M, PA-C      . ondansetron [facility]) injection 4 mg  4 mg Intravenous Q6H PRN Christell Faith M, PA-C      . sodium chloride flush (NS) 0.9 % injection 10-40 mL  10-40 mL Intracatheter Q12H Sueanne Margarita, MD   10 mL at 10/14/16 0850  . sodium chloride flush (NS) 0.9 % injection 10-40 mL  10-40 mL Intracatheter PRN Sueanne Margarita, MD        Prescriptions Prior to Admission  Medication Sig Dispense Refill Last Dose  . ALPRAZolam (XANAX) 0.25 MG tablet Take 0.25 mg by mouth daily.   Past Week at Unknown time  . aspirin EC 81 MG tablet Take 81 mg by mouth daily.   Past Week at Unknown time  . diltiazem (CARDIZEM CD) 120 MG 24 hr capsule Take 1 capsule by mouth daily.  2 Past Week at Unknown time  . ezetimibe-simvastatin (VYTORIN) 10-20 MG tablet Take 1 tablet by mouth daily.   Past Week at Unknown time  . atorvastatin  (LIPITOR) 80 MG tablet Take 1 tablet (80 mg total) by mouth daily at 6 PM. 30 tablet 0 Not started yet  . brompheniramine-pseudoephedrine-DM 30-2-10 MG/5ML syrup Take 5 mLs by mouth 4 (four) times daily as needed. (Patient not taking: Reported on 10/11/2016) 120 mL 0 Completed Course at Unknown time  . ciprofloxacin (CIPRO) 400 MG/200ML SOLN Inject 200 mLs (400 mg total) into the vein every 12 (twelve) hours. 2000 mL 0 Not started yet  . heparin 100-0.45 UNIT/ML-% infusion Inject 950 Units/hr into the vein continuous. 250 mL 0 Not started yet  . insulin aspart (NOVOLOG) 100 UNIT/ML injection Inject 3 Units into the skin 3 (three) times daily with meals. 10 mL 11 Not started yet  . insulin glargine (LANTUS) 100 UNIT/ML injection Inject 0.15 mLs (15 Units total) into the skin daily. 10 mL 11 Not started yet  . ipratropium-albuterol (DUONEB) 0.5-2.5 (3) MG/3ML SOLN Take 3 mLs by nebulization every 4 (four) hours as needed. 360 mL 0 Not started yet    Family History  Problem Relation Age of Onset  . Hypertension Mother   . Aortic aneurysm Father   . Hypertension Brother   . Lung disease Neg Hx   . Rheumatologic disease Neg Hx      Review of Systems:       Cardiac Review of Systems: Y or N  Chest Pain [  Yes  ]  Resting SOB [  no ] Exertional SOB  [ yes ]  Lamarr Lulas  ]   Pedal Edema [no   ]  Palpitations [ yes ] Syncope  [ yes ]   Presyncope [   ]  General Review of Systems: [Y] = yes [  ]=no Constitional: recent weight change [  ]; anorexia [  ]; fatigue [  ]; nausea [  ]; night sweats [  ]; fever [  ]; or chills [  ]                                                               Dental: poor dentition[  ]; Last Dentist visit: One year  Eye : blurred vision [  ]; diplopia [   ]; vision changes [  ];  Amaurosis fugax[  ]; Resp: cough [  ];  wheezing[  ];  hemoptysis[  ]; shortness of breath[  ]; paroxysmal nocturnal dyspnea[  ]; dyspnea on exertion[  ]; or orthopnea[  ];  GI:   gallstones[  ], vomiting[  ];  dysphagia[  ]; melena[  ];  hematochezia [  ]; heartburn[  ];   Hx of  Colonoscopy[  ]; GU: kidney stones [place]  ]; hematuria[  ];   dysuria [place]  ];  nocturia[  ];  history of     obstruction [  ]; urinary frequency [  ]             Skin: rash, swelling[  ];, hair loss[  ];  peripheral edema[  ];  or itching[  ]; Musculosketetal: myalgias[  ];  joint swelling[  ];  joint erythema[  ];  joint pain[  ];  back pain[  ];  Heme/Lymph: bruising[  ];  bleeding[  ];  anemia[  ];  Neuro: TIA[  ];  headaches[  ];  stroke[  ];  vertigo[  ];  seizures[  ];   paresthesias[  ];  difficulty walking[  ];  Psych:depression[  ]; anxiety[  ];  Endocrine: diabetes[ yes ];  thyroid dysfunction[  ];  Immunizations: Flu [  ]; Pneumococcal[  ];  Other: Smoker 0.5 pack per day, right-hand dominant, no rib fractures from CPR  Physical Exam: BP (!) 149/73   Pulse (!) 116   Temp 98.2 F (36.8 C) (Oral)   Resp (!) 32   Ht 5\' 3"  (1.6 m)   Wt 140 lb 14 oz (63.9 kg)   SpO2 96%   BMI 24.95 kg/m         Exam    General- alert and comfortable   Neck-no JVD adenopathy or bruit   Lungs- bilateral rales with scattered rhonchi and wheezes   Cor- regular rate, sinus tach, no murmur , gallop   Abdomen- soft, non-tender   Extremities - warm, non-tender, minimal edema   Neuro- oriented, appropriate, no focal weakness   Diagnostic Studies & Laboratory data:     Recent Radiology Findings:   US Renal  Result Date: 10/14/2016 CLINICAL DATA:  Kidney stone.  History of cardiac arrest. EXAM: RENAL / URINARY TRACT ULTRASOUND COMPLETE COMPARISON:  Abdominal CT 10/11/2016 FINDINGS: Right Kidney: Length: 11.5 cm. 2.2 cm hilar cyst. No hydronephrosis. Borderline hyperechoic renal cortex. Left Kidney: Length: 12.5 cm. Mild hydronephrosis, improved from CT 3 days ago. No evidence of mass. Bladder: Decompressed by Foley catheter. IMPRESSION: Mild left hydronephrosis, improved from CT 3 days ago.  Electronically Signed   By: Monte Fantasia M.D.   On: 10/14/2016 09:05   Dg Chest Port 1 View  Result Date: 10/14/2016 CLINICAL DATA:  66 y/o  F; respiratory distress. EXAM: PORTABLE CHEST 1 VIEW COMPARISON:  10/13/2016 chest radiograph FINDINGS: Asymmetric consolidations of the lungs are stable from prior chest radiographs. Small to moderate right pleural effusion. Stable cardiac silhouette within normal limits. Aortic atherosclerosis with calcification. No acute osseous abnormality. IMPRESSION: Stable asymmetric consolidations throughout the lungs and small to moderate right effusion. Differential includes multifocal pneumonia and pulmonary edema. Electronically Signed   By: Kristine Garbe M.D.   On: 10/14/2016 01:30   Dg Chest Port 1 View  Result Date: 10/13/2016 CLINICAL DATA:  Shortness of breath today EXAM: PORTABLE CHEST 1 VIEW COMPARISON:  Earlier today FINDINGS: Continued progression of bilateral airspace disease, asymmetric to the left. Normal heart size for technique. Negative mediastinal contours. Small if any pleural effusions. No pneumothorax. IMPRESSION: Progression of asymmetric bilateral airspace disease favoring pneumonia or noncardiogenic edema (was there aspiration at time of cardiac arrest?). Electronically Signed   By: Monte Fantasia M.D.   On: 10/13/2016 13:44   Dg Chest Port 1 View  Result Date: 10/13/2016 CLINICAL DATA:  Respiratory failure. EXAM: PORTABLE CHEST 1 VIEW COMPARISON:  10/11/2016. FINDINGS: Interim removal of endotracheal tube and NG tube. Heart size normal. New onset of diffuse bilateral pulmonary interstitial prominence noted. Findings consistent with a process such as interstitial pneumonitis or interstitial edema. Small right pleural effusion. No pneumothorax . IMPRESSION: 1.  Interim removal of endotracheal tube and NG tube. 2. New onset of diffuse bilateral pulmonary interstitial prominence consistent with a process such as interstitial pneumonitis  or interstitial edema. Small right pleural effusion. Electronically Signed   By: Marcello Moores  Register   On: 10/13/2016 06:19   Dg Abd Portable 1v  Result Date: 10/14/2016 CLINICAL DATA:  Ureteral calculus, abdominal pain EXAM: PORTABLE ABDOMEN - 1 VIEW COMPARISON:  CT abdomen and pelvis 10/11/2016 FINDINGS: LEFT pelvic phleboliths again identified. Distal LEFT ureteral calculus seen on prior CT exam is not visualized on current study. RIGHT femoral line noted. Bowel gas pattern normal. Diffuse osseous demineralization. IMPRESSION: Previously identified distal LEFT ureteral calculus is no longer seen. Electronically Signed   By: Lavonia Dana M.D.   On: 10/14/2016 07:40     I have independently reviewed the above radiologic studies.  Recent Lab Findings: Lab Results  Component Value Date   WBC 27.2 (H) 10/14/2016   HGB 9.9 (L) 10/14/2016   HCT 29.4 (L) 10/14/2016   PLT 216 10/14/2016   GLUCOSE 185 (H) 10/14/2016   CHOL 81 10/13/2016   TRIG 56 10/13/2016   HDL 34 (L) 10/13/2016   LDLCALC 36 10/13/2016   ALT 55 (H) 10/14/2016   AST 87 (H) 10/14/2016   NA 124 (L) 10/14/2016   K 3.4 (L) 10/14/2016   CL 90 (L) 10/14/2016   CREATININE 0.59 10/14/2016   BUN 10 10/14/2016   CO2 25 10/14/2016   TSH 1.154 10/12/2016   INR 1.08 10/14/2016   HGBA1C 7.0 (H) 10/12/2016      Assessment / Plan:     Cardiac arrest associated with anaphylactic reaction to ceftriaxone   Good neurologic recovery, currently with probable ARDS, possible aspiration  Urosepsis with Klebsiella being treated with IV Cipro Pre-existing severe multivessel CAD with preserved LV systolic function after cardiac arrest Patient would benefit from multivessel CABG after she recovers from her cardiac arrest, ARDS and urosepsis. She will  need IV Cipr and in-hospital monitoring.. I will monitor her pulmonary status and general condition but anticipate she would be ready for multivessel CABG next week September 20. I discussed this plan  with the patient and her family and she is in agreement.     @ME1 @ 10/14/2016 6:12 PM

## 2016-10-14 NOTE — Progress Notes (Signed)
Dr. Starla Link paged to clarify attending. Per MD, cardiology is the attending service & triad is consulting. Will follow up with cards.

## 2016-10-14 NOTE — Progress Notes (Signed)
[facility] for heparin Indication: chest pain/ACS  Allergies  Allergen Reactions  . Ceftriaxone     Anaphylaxis, cardiac arrest    Patient Measurements: Height: 5\' 3"  (160 cm) Weight: 148 lb 13 oz (67.5 kg) IBW/kg (Calculated) : 52.4 Heparin Dosing Weight: 66 kg  Vital Signs: Temp: 97.7 F (36.5 C) (09/14 0400) Temp Source: Oral (09/14 0400) BP: 151/66 (09/14 0400) Pulse Rate: 105 (09/14 0400)  Labs:  Recent Labs  10/11/16 1237 10/11/16 1651 10/11/16 2306  10/12/16 0455 10/12/16 0653  10/13/16 0452 10/13/16 0518 10/13/16 0829 10/13/16 1315 10/14/16 0442  HGB  --   --   --   --  12.7 13.3  --  10.7*  --   --   --  9.9*  HCT  --   --   --   --  38.6 41.1  --  31.4*  --   --   --  29.4*  PLT  --   --   --   --  329 348  --  241  --   --   --  216  APTT 55*  --   --   --   --   --   --   --   --   --   --   --   LABPROT 20.0*  --   --   --   --   --   --   --   --   --   --  13.9  INR 1.72  --   --   --   --   --   --   --   --   --   --  1.08  HEPARINUNFRC  --   --   --   < >  --  0.51  < >  --  0.25*  --  0.39 0.21*  CREATININE 0.99  --   --   < > 1.03*  --   --  0.80  --  0.70  --   --   TROPONINI 7.82* 7.77* 5.68*  --  3.19*  --   --   --   --   --   --   --   < > = values in this interval not displayed.  Estimated Creatinine Clearance: 65.5 mL/min (by C-G formula based on SCr of 0.7 mg/dL).   Assessment: 66 y.o. female with CAD awaiting possible CABG for heparin  Goal of Therapy:  Heparin level 0.3-0.7 units/ml Monitor platelets by anticoagulation protocol: Yes   Plan:  Increase Heparin  1050 units/hr Check heparin level in 6 hours.   Phillis Knack, PharmD, BCPS

## 2016-10-14 NOTE — Progress Notes (Signed)
eLink Physician-Brief Progress Note Patient Name: Lisa [name] DOB: 12/31/1950 MRN: [id number]   Date of Service  10/14/2016  HPI/Events of Note  Discussed case with Dr Radford Pax. Tachypnea, some agitation. -2.6L. Now on high flow O2. Will give additional dose lasix x 1 and follow. She has prn xanax ordered  eICU Interventions       Intervention Category Intermediate Interventions: Other:  Celsey Asselin S. 10/14/2016, 2:35 AM

## 2016-10-14 NOTE — Consult Note (Signed)
[facility] Attending Consult  Physician Requesting Consult:  Fransico Him, M.D. / Cardiology  Date of Consult:  10/14/2016  Reason for Consult/Chief Complaint:  Acute Hypoxic Respiratory Failure  History of Presenting Illness:  History obtained from the patient's electronic medical recordand the patient. 66 y.o. female who presented to outside hospital on 9/11 with left flank pain. Imaging revealed a left ureteral stone as well as hydronephrosis. Rocephin was administered and very shortly after the patient developed a metallic taste and suffered cardiac arrest. Patient underwent resuscitation for approximately 45 minutes.er cardiology documentation the patient required multiple shocks with ventricular fibrillation as the underlying rhythm problem. Patient reportedly was in complete heart block with a wide complex rhythm that prompted patient to undergo emergent left heart catheterization. She was subsequently found to have two-vessel coronary artery disease. Initially patient was on transcutaneous pacing. Patient was placed on normothermia protocol. Patient was subsequently extubated on 9/12 and per the electronic medical record began having increased shortness of breath with desaturation and crackles around 3 AM on 9/13. Lasix was ordered. Oxygen requirement increased from 2 L/m to 5 L/m to maintain saturation greater than 90%. Patient had significant diuresis with that dose of Lasix. Notably the patient also developed worsening hyponatremia as well. Patient's daughter reports that her degree of facial edema and hand swelling have dramatically improved after receiving IV Lasix. Patient denies any subjective fever, chills, or sweats. She continues to have significant chest wall pain from her previous CPR. Additionally, she has an intermittent, nonproductive cough. Reports her dyspnea has slowly worsened since her extubation on 9/12 without any perceived acute worsening. Denies any  abdominal pain, nausea, or vomiting. Denies any sore throat or sinus congestion. Denies any breathing problems preceding her admission or recent sick contacts. No joint pain, swelling, or erythema. Patient reports she did have pneumonia in January but has no history of childhood asthma, allergies, or bronchitis.  Review of Systems: No rashes or abnormal bruising. No headache or vision changes. A pertinent 14 point review of systems is negative except as per the history of presenting illness.  Allergies  Allergen Reactions  . Ceftriaxone     Anaphylaxis, cardiac arrest    No current facility-administered medications on file prior to encounter.    Current Outpatient Prescriptions on File Prior to Encounter  Medication Sig Dispense Refill  . ALPRAZolam (XANAX) 0.25 MG tablet Take 0.25 mg by mouth daily.    Marland Kitchen aspirin EC 81 MG tablet Take 81 mg by mouth daily.    Marland Kitchen diltiazem (CARDIZEM CD) 120 MG 24 hr capsule Take 1 capsule by mouth daily.  2  . ezetimibe-simvastatin (VYTORIN) 10-20 MG tablet Take 1 tablet by mouth daily.    Marland Kitchen atorvastatin (LIPITOR) 80 MG tablet Take 1 tablet (80 mg total) by mouth daily at 6 PM. 30 tablet 0  . brompheniramine-pseudoephedrine-DM 30-2-10 MG/5ML syrup Take 5 mLs by mouth 4 (four) times daily as needed. (Patient not taking: Reported on 10/11/2016) 120 mL 0  . ciprofloxacin (CIPRO) 400 MG/200ML SOLN Inject 200 mLs (400 mg total) into the vein every 12 (twelve) hours. 2000 mL 0  . heparin 100-0.45 UNIT/ML-% infusion Inject 950 Units/hr into the vein continuous. 250 mL 0  . insulin aspart (NOVOLOG) 100 UNIT/ML injection Inject 3 Units into the skin 3 (three) times daily with meals. 10 mL 11  . insulin glargine (LANTUS) 100 UNIT/ML injection Inject 0.15 mLs (15 Units total) into the skin daily. 10 mL 11  .  ipratropium-albuterol (DUONEB) 0.5-2.5 (3) MG/3ML SOLN Take 3 mLs by nebulization every 4 (four) hours as needed. 360 mL 0    Past Medical History:  Diagnosis Date   . CAD (coronary artery disease)    a. LHC 10/11/2016: LM 20%, oLAD 40%, p-mLAD 40% eccentric/calcified, dLAD 40%, p-mLCx 95%, eccentric, sev calcified, pRCA 30%, eccentric, sev calcified, mRCA lesion-1 90% focal & eccentric, sev calcified, mRCA lesion-2 99% sev calcified, EF 55-65%  . Nephrolithiasis   . NSTEMI (non-ST elevated myocardial infarction) ([place]) 10/11/2016  . Pneumonia 02/2016  . PVD (peripheral vascular disease) ([place])    a. Abdominal aortogram 10/11/16: Infrarenal abdominal aorta heavily calcified & ectatic with approximately 40-50% stenosis at the bifurcation. Mild diffuse disease w/ heavy calcification noted in the common & external iliac arteries bilaterally  . Type I diabetes mellitus ([place])   . Ventricular fibrillation ([place]) 10/11/2016   a. Felt to be secondary to demand ischemia in the setting of underlying CAD secondary to anaphylactic reaction from IV Rocephin    Past Surgical History:  Procedure Laterality Date  . ABDOMINAL AORTOGRAM N/A 10/11/2016   Procedure: ABDOMINAL AORTOGRAM;  Surgeon: Nelva Bush, MD;  Location: [facility];  Service: Cardiovascular;  Laterality: N/A;  . LEFT HEART CATH AND CORONARY ANGIOGRAPHY N/A 10/11/2016   Procedure: LEFT HEART CATH AND CORONARY ANGIOGRAPHY;  Surgeon: Nelva Bush, MD;  Location: [facility];  Service: Cardiovascular;  Laterality: N/A;  . TONSILLECTOMY      Family History  Problem Relation Age of Onset  . Hypertension Mother   . Aortic aneurysm Father   . Hypertension Brother   . Lung disease Neg Hx   . Rheumatologic disease Neg Hx     Social History   Social History  . Marital status: Married    Spouse name: N/A  . Number of children: N/A  . Years of education: N/A   Social History Main Topics  . Smoking status: Current Every Day Smoker    Packs/day: 0.50    Years: 47.00    Types: Cigarettes    Start date: 01/25/1969  . Smokeless tobacco: Never Used     Comment: Second-hand  exposure through her husband & father as well.  . Alcohol use 0.0 oz/week  . Drug use: No  . Sexual activity: Not Currently   Other Topics Concern  . None   Social History Narrative   [facility] (10/14/16):   Patient has primarily done office work. Married for approximately 1 year. Has a dog at home but no bird exposure. No mold exposure.   Temp:  [97.7 F (36.5 C)-98.7 F (37.1 C)] 98.7 F (37.1 C) (09/14 1223) Pulse Rate:  [96-110] 105 (09/14 1100) Resp:  [25-41] 31 (09/14 1100) BP: (108-178)/(59-96) 130/82 (09/14 1100) SpO2:  [90 %-100 %] 95 % (09/14 1100) FiO2 (%):  [97 %-100 %] 97 % (09/14 0819) Weight:  [140 lb 14 oz (63.9 kg)] 140 lb 14 oz (63.9 kg) (09/14 0500)  General:  No distress. Family at bedside. Endotracheally intubated.  Integument:  Warm & dry. No rash or bruising on exposed skin.  Extremities:  No cyanosis or clubbing.  Lymphatics:  No appreciated cervical or supraclavicular lymphadenoapthy. HEENT:  Moist mucus membranes. No oral ulcers. No scleral icterus. Endotracheal tube in place. Cardiovascular:  Regular rate & rhythm. No edema. No JVD appreciated.  Telemetry:  Normal sinus rhythm. Pulmonary:  Coarse breath sounds bilaterally. Symmetric chest wall rise on ventilator.Patient able to inhale only approximately 250  mL on incentive spirometer. Abdomen: Soft. Normoactive bowel sounds. Nondistended.  Musculoskeletal:  Normal bulk. No joint deformity or effusion appreciated. Neurological:  No meningismus or nuchal rigidity. Pupils symmetric and reactrive. Sedated with no spontaneous movements.  Psychiatric:  Unable to assess given intubated status.   LINES/TUBES: OETT 9/11 - 9/12 R FEM CVL 9/11 >>> Foley >>> PIV  CBC Latest Ref Rng & Units 10/14/2016 10/13/2016 10/12/2016  WBC 4.0 - 10.5 K/uL 27.2(H) 28.3(H) 51.3(HH)  Hemoglobin 12.0 - 15.0 g/dL 9.9(L) 10.7(L) 13.3  Hematocrit 36.0 - 46.0 % 29.4(L) 31.4(L) 41.1  Platelets 150 - 400 K/uL 216 241 348    BMP Latest Ref Rng & Units 10/14/2016 10/13/2016 10/13/2016  Glucose 65 - 99 mg/dL 245(H) 240(H) 252(H)  BUN 6 - 20 mg/dL _0 Creatinine 0.44 - 1.00 mg/dL 0.84 0.70 0.80  Sodium 135 - 145 mmol/L 121(L) 123(L) 125(L)  Potassium 3.5 - 5.1 mmol/L 3.0(L) 3.4(L) 3.7  Chloride 101 - 111 mmol/L 86(L) 94(L) 96(L)  CO2 22 - 32 mmol/L 22 20(L) 21(L)  Calcium 8.9 - 10.3 mg/dL 6.6(L) 6.4(LL) 6.6(L)    Hepatic Function Latest Ref Rng & Units 10/14/2016 10/13/2016 10/11/2016  Total Protein 6.5 - 8.1 g/dL 5.6(L) 5.3(L) 7.5  Albumin 3.5 - 5.0 g/dL 2.6(L) 2.6(L) 3.8  AST 15 - 41 U/L 87(H) 133(H) 22  ALT 14 - 54 U/L 55(H) 64(H) 21  Alk Phosphatase 38 - 126 U/L 75 71 80  Total Bilirubin 0.3 - 1.2 mg/dL 1.3(H) 0.9 0.6    IMAGING/STUDIES: CT RENAL/STONE STUDY 9/11: IMPRESSION: 1. 4 mm distal left ureteral calculus with mild hydroureteronephrosis. 2. Punctate nonobstructing bilateral renal calculi. 3. Small hiatal hernia. 4. Aortic Atherosclerosis (ICD10-I70.0). Mild infrarenal aortic ectasia and suspected short segment aortic dissection. LHC 9/11: 1. Severe 2 vessel coronary artery disease, including heavily calcified 95% proximal/mid LCx and sequential 90-99% mid RCA stenoses. 2. Mild to moderate, nonobstructive disease involving the LMCA and LAD. 3. Overall preserved LV contraction with basal inferior hypokinesis. 4. Upper normal to mildly elevated left ventricular filling pressure (LVEDP 15-20 mmHg). 5. Peripheral vascular disease with ectasia and calcification of the infrarenal abdominal aorta and iliac arteries. TTE 9/12:  LV normal in size with EF 55-60% & normal regional wall motion grossly but unable to exclude hypokinesis of inferior wall. Normal diastolic function. LA & RA normal in size. RV normal in size and function. Aortic valve poorly visualized but without obvious stenosis or regurgitation. Aortic root normal in size. No mitral stenosis or regurgitation. No pulmonic stenosis. Mild  tricuspid regurgitation. No pericardial effusion. RENAL U/S 9/14: Mild left hydronephrosis, improved from CT 3 days ago.  PORT CXR 9/14:  Personally reviewed by me. Endotracheal tube removed. Patchy bilateral alveolar and interstitial opacities with some suggestion of consolidation on the left. These findings are worse on the left compared with the right. Blunting of right costophrenic angle with meniscus suggestive of pleural effusion. PFT 9/14:  FVC 0.95 L (31%) FEV1 0.79 L (34%) FEV1/FVC 0.84 FEF 25-75 0.86 L (41%)  MICROBIOLOGY: MRSA PCR 9/11:  Negative Urine Culture 9/11:  E coli & Klebisella pneumoniae (both sensitive to Cipro) Blood Cultures x2 9/12 >>>  ANTIBIOTICS: Rocephin 9/11 (possible anaphylaxis after 1 dose) Cipro 9/11 >>>  SIGNIFICANT EVENTS: 09/11 - Admit to [facility] after cardiac arrest >> started normothermia protocol & underwent LHC. 09/13 - Increased SOB & hypoxia (2L >> 5L) around 3am>>Lasix given. Transfer from [facility] to [facility] 09/14 - PCCM consult for hypoxia &  possible multi-focal pneumonia. Additional lasix given overnight.  ASSESSMENT/PLAN:  67 y.o. female admitted with urinary tract infection and left nephrolithiasis with some element of hydronephrosis. Patient suffered ventricular fibrillation cardiac arrest after Rocephin in the emergency department. Neurologically the patient seems to recover and is intact. She is currently being evaluated for treatment of her underlying coronary artery disease. I suspect her hypoxia is more related to atelectasis, volume overload, and some element of lung injury from her resuscitation efforts. Given her improving Procalcitonin and leukocytosis as well as her clinical history I'm less suspicious about an evolving healthcare associated pneumonia. Even so, this must be watched closely. I suspect her hyponatremia is secondary to low intracellular potassium further depleted by IV Lasix resulting in subsequent electrolyte shifts and  hyponatremia.  1. Acute hypoxic respiratory failure: Likely multifactorial in etiology. Holding on additional Lasix until electrolyte panel is reviewed. Continuing supplemental oxygen therapy. Patient instructed on proper technique with incentive spirometry. Ordering Tessalon Perles to minimize cough for patient comfort during deep breathing. 2. Bilateral lung opacities: Suspect multifactorial in etiology. Repeat chest x-ray in the morning to reevaluate post-diuresis. 3. Hyponatremia & hypokalemia: Repeating basic metabolic panel and magnesium at 2 PM today. Further management per internal medicine. 4. Central venous catheter: I have ordered the discontinuation of her femoral central venous catheter following her labs at 2 PM.  Remainder of care as per primary service and other consultants.  Sonia Baller Ashok Cordia, M.D. [facility] Pager:  [phone] After 3pm or if no response, call [phone] 1:05 PM 10/14/16

## 2016-10-14 NOTE — Progress Notes (Signed)
[place] for heparin Indication: chest pain/ACS  Allergies  Allergen Reactions  . Ceftriaxone     Anaphylaxis, cardiac arrest    Patient Measurements: Height: 5\' 3"  (160 cm) Weight: 140 lb 14 oz (63.9 kg) IBW/kg (Calculated) : 52.4 Heparin Dosing Weight: 64 kg  Vital Signs: Temp: 98.7 F (37.1 C) (09/14 1223) Temp Source: Oral (09/14 1223) BP: 151/69 (09/14 1500) Pulse Rate: 115 (09/14 1500)  Labs:  Recent Labs  10/11/16 1651 10/11/16 2306  10/12/16 0455 10/12/16 0653  10/13/16 0452  10/13/16 0829 10/13/16 1315 10/14/16 0442 10/14/16 1347  HGB  --   --   < > 12.7 13.3  --  10.7*  --   --   --  9.9*  --   HCT  --   --   < > 38.6 41.1  --  31.4*  --   --   --  29.4*  --   PLT  --   --   < > 329 348  --  241  --   --   --  216  --   LABPROT  --   --   --   --   --   --   --   --   --   --  13.9  --   INR  --   --   --   --   --   --   --   --   --   --  1.08  --   HEPARINUNFRC  --   --   < >  --  0.51  < >  --   < >  --  0.39 0.21* 0.16*  CREATININE  --   --   < > 1.03*  --   --  0.80  --  0.70  --  0.84 0.59  TROPONINI 7.77* 5.68*  --  3.19*  --   --   --   --   --   --   --   --   < > = values in this interval not displayed.  Estimated Creatinine Clearance: 63.9 mL/min (by C-G formula based on SCr of 0.59 mg/dL).   Assessment: 66 y.o. female with severe 2-vessel CAD awaiting possible CABG. Heparin level subtherapeutic at 0.16 after rate increase. RN confirms no problem with the IV lines, no interruptions to heparin, and level was drawn appropriately by her.   CBC stable, no signs of bleeding  Goal of Therapy:  Heparin level 0.3-0.7 units/ml Monitor platelets by anticoagulation protocol: Yes   Plan:  Heparin 1900 unit bolus x 1 Increase Heparin gtt to 1250 units/hr Check heparin level in 6 hours Daily CBC and heparin level  Leroy Libman, PharmD Pharmacy Resident Pager: [phone]

## 2016-10-14 NOTE — Progress Notes (Addendum)
Progress Note  Patient Name: Lisa [name] Date of Encounter: 10/14/2016  Primary Cardiologist: Dr. Saunders Revel  Subjective   Patient SOB overnight despite diuresing well with IV lasix.  Seems to be an anxiety component.  Daughter states her mom has severe anxiety.  Feels better after breathing Rx, Xanax and morphine.  Inpatient Medications    Scheduled Meds: . ALPRAZolam  0.25 mg Oral Daily  . aspirin EC  81 mg Oral Daily  . atorvastatin  80 mg Oral q1800  . Chlorhexidine Gluconate Cloth  6 each Topical Daily  . ciprofloxacin  500 mg Oral BID  . insulin aspart  0-9 Units Subcutaneous TID WC  . insulin aspart  3 Units Subcutaneous TID WC  . insulin glargine  18 Units Subcutaneous Daily  . ipratropium-albuterol  3 mL Nebulization Q6H  . mouth rinse  15 mL Mouth Rinse BID  . sodium chloride flush  10-40 mL Intracatheter Q12H   Continuous Infusions: . heparin 1,050 Units/hr (10/14/16 0535)   PRN Meds: acetaminophen, ALPRAZolam, morphine injection, nitroGLYCERIN, ondansetron (ZOFRAN) IV, sodium chloride flush   Vital Signs    Vitals:   10/14/16 0900 10/14/16 1000 10/14/16 1100 10/14/16 1223  BP: (!) 144/68 135/65 130/82   Pulse: (!) 104 (!) 106 (!) 105   Resp: (!) 30 (!) 34 (!) 31   Temp:    98.7 F (37.1 C)  TempSrc:    Oral  SpO2: 96% 96% 95%   Weight:      Height:        Intake/Output Summary (Last 24 hours) at 10/14/16 1253 Last data filed at 10/14/16 0910  Gross per 24 hour  Intake          1331.92 ml  Output             5195 ml  Net         -3863.08 ml   Filed Weights   10/13/16 1100 10/14/16 0500  Weight: 148 lb 13 oz (67.5 kg) 140 lb 14 oz (63.9 kg)    Telemetry    NSR - Personally Reviewed  ECG    No new EKG today to review - Personally Reviewed  Physical Exam   GEN: mild distress with SOB and anxiety   Neck: No JVD Cardiac: RRR, no murmurs, rubs, or gallops.  Respiratory: diffuse rhonchi and decreased BS throughout GI: Soft, nontender,  non-distended  MS: No edema; No deformity. Neuro:  Nonfocal  Psych: Normal affect   Labs    Chemistry Recent Labs Lab 10/11/16 0711  10/13/16 0452 10/13/16 0829 10/14/16 0442  NA 134*  < > 125* 123* 121*  K 4.6  < > 3.7 3.4* 3.0*  CL 98*  < > 96* 94* 86*  CO2 27  < > 21* 20* 22  GLUCOSE 222*  < > 252* 240* 245*  BUN 13  < > 20 18 12   CREATININE 0.88  < > 0.80 0.70 0.84  CALCIUM 9.0  < > 6.6* 6.4* 6.6*  PROT 7.5  --  5.3*  --  5.6*  ALBUMIN 3.8  --  2.6*  --  2.6*  AST 22  --  133*  --  87*  ALT 21  --  64*  --  55*  ALKPHOS 80  --  71  --  75  BILITOT 0.6  --  0.9  --  1.3*  GFRNONAA >60  < > >60 >60 >60  GFRAA >60  < > >60 >60 >60  ANIONGAP 9  < > 8 9 13   < > = values in this interval not displayed.   Hematology Recent Labs Lab 10/12/16 0653 10/13/16 0452 10/14/16 0442  WBC 51.3* 28.3* 27.2*  RBC 4.84 3.78* 3.69*  HGB 13.3 10.7* 9.9*  HCT 41.1 31.4* 29.4*  MCV 85.1 83.1 79.7  MCH 27.5 28.2 26.8  MCHC 32.3 34.0 33.7  RDW 14.2 13.9 13.5  PLT 348 241 216    Cardiac Enzymes Recent Labs Lab 10/11/16 1237 10/11/16 1651 10/11/16 2306 10/12/16 0455  TROPONINI 7.82* 7.77* 5.68* 3.19*   No results for input(s): TROPIPOC in the last 168 hours.   BNP Recent Labs Lab 10/13/16 1217 10/14/16 0443  BNP 217.2* 129.5*     DDimer No results for input(s): DDIMER in the last 168 hours.   Radiology    US Renal  Result Date: 10/14/2016 CLINICAL DATA:  Kidney stone.  History of cardiac arrest. EXAM: RENAL / URINARY TRACT ULTRASOUND COMPLETE COMPARISON:  Abdominal CT 10/11/2016 FINDINGS: Right Kidney: Length: 11.5 cm. 2.2 cm hilar cyst. No hydronephrosis. Borderline hyperechoic renal cortex. Left Kidney: Length: 12.5 cm. Mild hydronephrosis, improved from CT 3 days ago. No evidence of mass. Bladder: Decompressed by Foley catheter. IMPRESSION: Mild left hydronephrosis, improved from CT 3 days ago. Electronically Signed   By: Monte Fantasia M.D.   On: 10/14/2016 09:05    Dg Chest Port 1 View  Result Date: 10/14/2016 CLINICAL DATA:  66 y/o  F; respiratory distress. EXAM: PORTABLE CHEST 1 VIEW COMPARISON:  10/13/2016 chest radiograph FINDINGS: Asymmetric consolidations of the lungs are stable from prior chest radiographs. Small to moderate right pleural effusion. Stable cardiac silhouette within normal limits. Aortic atherosclerosis with calcification. No acute osseous abnormality. IMPRESSION: Stable asymmetric consolidations throughout the lungs and small to moderate right effusion. Differential includes multifocal pneumonia and pulmonary edema. Electronically Signed   By: Kristine Garbe M.D.   On: 10/14/2016 01:30   Dg Chest Port 1 View  Result Date: 10/13/2016 CLINICAL DATA:  Shortness of breath today EXAM: PORTABLE CHEST 1 VIEW COMPARISON:  Earlier today FINDINGS: Continued progression of bilateral airspace disease, asymmetric to the left. Normal heart size for technique. Negative mediastinal contours. Small if any pleural effusions. No pneumothorax. IMPRESSION: Progression of asymmetric bilateral airspace disease favoring pneumonia or noncardiogenic edema (was there aspiration at time of cardiac arrest?). Electronically Signed   By: Monte Fantasia M.D.   On: 10/13/2016 13:44   Dg Chest Port 1 View  Result Date: 10/13/2016 CLINICAL DATA:  Respiratory failure. EXAM: PORTABLE CHEST 1 VIEW COMPARISON:  10/11/2016. FINDINGS: Interim removal of endotracheal tube and NG tube. Heart size normal. New onset of diffuse bilateral pulmonary interstitial prominence noted. Findings consistent with a process such as interstitial pneumonitis or interstitial edema. Small right pleural effusion. No pneumothorax . IMPRESSION: 1.  Interim removal of endotracheal tube and NG tube. 2. New onset of diffuse bilateral pulmonary interstitial prominence consistent with a process such as interstitial pneumonitis or interstitial edema. Small right pleural effusion. Electronically  Signed   By: Marcello Moores  Register   On: 10/13/2016 06:19   Dg Abd Portable 1v  Result Date: 10/14/2016 CLINICAL DATA:  Ureteral calculus, abdominal pain EXAM: PORTABLE ABDOMEN - 1 VIEW COMPARISON:  CT abdomen and pelvis 10/11/2016 FINDINGS: LEFT pelvic phleboliths again identified. Distal LEFT ureteral calculus seen on prior CT exam is not visualized on current study. RIGHT femoral line noted. Bowel gas pattern normal. Diffuse osseous demineralization. IMPRESSION: Previously identified distal LEFT ureteral calculus  is no longer seen. Electronically Signed   By: Lavonia Dana M.D.   On: 10/14/2016 07:40    Cardiac Studies  Conclusion  Cardiac Cath 10/11/2016 Conclusions: 1. Severe 2 vessel coronary artery disease, including heavily calcified 95% proximal/mid LCx and sequential 90-99% mid RCA stenoses. 2. Mild to moderate, nonobstructive disease involving the LMCA and LAD. 3. Overall preserved LV contraction with basal inferior hypokinesis. 4. Upper normal to mildly elevated left ventricular filling pressure (LVEDP 15-20 mmHg). 5. Peripheral vascular disease with ectasia and calcification of the infrarenal abdominal aorta and iliac arteries.  Recommendations: 1. I suspect patient's cardiac arrest was due to demand ischemia in the setting of anaphylactic shock from allergic reaction to ceftriaxone. She has TIMI-3 flow in all major epicardial coronary arteries. I recommend medical therapy and treatment of the underlying infection. If she has a meaningful neurologic recovery, atherectomy/PCI to the RCA and LCx will need to be considered. ADDENDUM: Images reviewd on 10/12/16 at interventional conference. Given history of DM, severe calcification of lesions, and eccentricity of mid RCA stenosis, cardiac surgery consultation for CABG should be obtained. If Ms. Crandell is deemed a poor surgical candidate, high-risk PCI with atherectomy could be considered. 2. Obtain transthoracic echocardiogram to confirm LVEF and  evaluate for other structural abnormalities. 3. I would like to avoid intra-aortic balloon pump placement if possible, given aortic disease. If hypotension becomes a problem, judicious vasopressor support is recommended. 4. Low-dose aspirin and high intensity statin therapy. Consider heparin infusion (ACS nomogram) if no invasive procedures are necessary (i.e. percutaneous nephrostomy tube). 5. Consider addition of low-dose beta blocker if heart rate and blood pressure allow. If significant ventricular ectopy is observed, amiodarone infusion should be considered.   2D echo 10/12/2016 Study Conclusions  - Left ventricle: The cavity size was normal. Systolic function was   normal. The estimated ejection fraction was in the range of 55%   to 60%. Wall motion was grossly normal; Unable to exclude mild   hypokinesis of the inferior wall. Left ventricular diastolic   function parameters were normal. - Left atrium: The atrium was normal in size. - Right ventricle: Systolic function was normal. - Pulmonary arteries: Systolic pressure was within the normal   range.    Patient Profile     66 y.o. female with h/o type I DM with insulin pump who presented to [facility] on 9/11 with flank pain and was found to have nephrolithiasis with possible UTI. Immediately after receiving IV Rocephin in the ED, patient started with seizure-like activity and suffered Vfib arrest. Cardiology urgently consulted at [facility]. Patient intubated and underwent cath showing severe 2 vessel CAD with 95% prox to mid LCx and 90-99% sequential mid RCA stehosis.  She is now transferred to [facility] for CVTS consult for CABG.  Assessment & Plan    1.  Cardiac arrest secondary to Vfib after receiving IV rocephin for UTI with nephrolithiasis.  Received 6 aps epi, 1 amp atropine, intubated and IV solumedrol given.  Shocked for Vib x 4 and loaded with IV Amio as well as IV Mag.  Subsequently had bradycardia with CHB followed by PEA arrest and code  STEMI called.  CPR restarted.  Trancutaneous paced and taken to cath lab for cath.  Arrest felt secondary to demand ischemia in the setting of anaphylactic shock from Ceftriaxone in the setting of obstructive CAD.  She was cooled and placed on pressors.  She is now extubated.  2.  ASCAD - severe 2 vessel with heavily calcified  95% prox to mid LCX and 90-99% sequential mid RCA stenosis and mild to moderate nonobstructive disease of the LM and LAD with overall preserved LVF and basal inferior HK.   - peak trop 7.82 and trending downward.  - plan was for consideration of CABG once full neurologic recovery made. - she has no neurologic deficits at this time. - continue IV Heparin gtt, ASA, statin high dose  3.  Acute respiratory failure secondary to cardiac arrest.  She is extubated and has problems with SOB which is likely multifactorial from volume overload, anxiety and possible PNA. - WBC elevated up to 50K initially but likely related to underlying UTI, stress, IV steroids but cxray concerning for multilobar PNA an lungs sound junky.   - more SOB despite good diuresis overnight with Lasix - will ask pulmonary /CCM to consult for help - sodium significantly reduced and concerned about giving any more Lasix.  I'm not convinced that hyponatremia related to volume overload.   - BNP only minimally elevated and LVEDP at cath was upper normal - she put out 5.3L yesterday and is net neg 3.9L.  Weight down 8lbs from yesterday. - ? Whether she could have aspiration PNA  4.  Hyponatremia - she has only had 2 doses of lasix with a profound drop in her sodium - workup per IM - urine and serum osmolarity and urine Na pending  5.  UTI with nephrolithiasis - treatment per Urology - repeating renal US and KUB to see if stone has passed - continue on Cipro for Klebsiella/E Coli  6.  Leukocytosis - likely related to IV steroids, UTI, stress demargination but with worsening cxray with ? Multilobar infiltrates  need to consider aspiration PNA.   - per IM - WBC slowly improving but still up significantly  7.  Hypokalemia - repleting - repeat BMET  8.  Hypocalcemia - could be related to low albumin - check ionized calcium  9.  Shock liver - LFTs trending downward  The patient is critically ill with multiple organ systems failure and requires high complexity decision making for assessment and support, frequent evaluation and titration of therapies, application of advanced monitoring technologies and extensive interpretation of multiple databases. Critical Care Time devoted to patient care services described in this note independent of APP time is 60 minutes with >50% of time spent in direct patient care.    For questions or updates, please contact [place] Please consult [email or web address] for contact info under Cardiology/STEMI.      Signed, Fransico Him, MD  10/14/2016, 12:53 PM

## 2016-10-15 ENCOUNTER — Inpatient Hospital Stay (HOSPITAL_COMMUNITY): Payer: Managed Care, Other (non HMO)

## 2016-10-15 DIAGNOSIS — N390 Urinary tract infection, site not specified: Secondary | ICD-10-CM

## 2016-10-15 DIAGNOSIS — I4901 Ventricular fibrillation: Principal | ICD-10-CM

## 2016-10-15 DIAGNOSIS — I469 Cardiac arrest, cause unspecified: Secondary | ICD-10-CM

## 2016-10-15 LAB — CBC
HEMATOCRIT: 26.5 % — AB (ref 36.0–46.0)
HEMOGLOBIN: 8.9 g/dL — AB (ref 12.0–15.0)
MCH: 26.8 pg (ref 26.0–34.0)
MCHC: 33.6 g/dL (ref 30.0–36.0)
MCV: 79.8 fL (ref 78.0–100.0)
Platelets: 211 10*3/uL (ref 150–400)
RBC: 3.32 MIL/uL — AB (ref 3.87–5.11)
RDW: 13.4 % (ref 11.5–15.5)
WBC: 26.4 10*3/uL — AB (ref 4.0–10.5)

## 2016-10-15 LAB — GLUCOSE, CAPILLARY
GLUCOSE-CAPILLARY: 275 mg/dL — AB (ref 65–99)
Glucose-Capillary: 206 mg/dL — ABNORMAL HIGH (ref 65–99)
Glucose-Capillary: 237 mg/dL — ABNORMAL HIGH (ref 65–99)
Glucose-Capillary: 281 mg/dL — ABNORMAL HIGH (ref 65–99)

## 2016-10-15 LAB — BLOOD GAS, ARTERIAL
Acid-Base Excess: 2.9 mmol/L — ABNORMAL HIGH (ref 0.0–2.0)
Bicarbonate: 28.1 mmol/L — ABNORMAL HIGH (ref 20.0–28.0)
Drawn by: 40415
O2 Content: 7 L/min
O2 Saturation: 98.1 %
Patient temperature: 98.6
pCO2 arterial: 52.4 mmHg — ABNORMAL HIGH (ref 32.0–48.0)
pH, Arterial: 7.349 — ABNORMAL LOW (ref 7.350–7.450)
pO2, Arterial: 124 mmHg — ABNORMAL HIGH (ref 83.0–108.0)

## 2016-10-15 LAB — RENAL FUNCTION PANEL
ALBUMIN: 2.3 g/dL — AB (ref 3.5–5.0)
Anion gap: 7 (ref 5–15)
BUN: 11 mg/dL (ref 6–20)
CALCIUM: 6.9 mg/dL — AB (ref 8.9–10.3)
CO2: 27 mmol/L (ref 22–32)
CREATININE: 0.61 mg/dL (ref 0.44–1.00)
Chloride: 88 mmol/L — ABNORMAL LOW (ref 101–111)
Glucose, Bld: 196 mg/dL — ABNORMAL HIGH (ref 65–99)
PHOSPHORUS: 1.4 mg/dL — AB (ref 2.5–4.6)
Potassium: 3.8 mmol/L (ref 3.5–5.1)
SODIUM: 122 mmol/L — AB (ref 135–145)

## 2016-10-15 LAB — TSH: TSH: 1.204 u[IU]/mL (ref 0.350–4.500)

## 2016-10-15 LAB — HEMOGLOBIN A1C
Hgb A1c MFr Bld: 7 % — ABNORMAL HIGH (ref 4.8–5.6)
Mean Plasma Glucose: 154.2 mg/dL

## 2016-10-15 LAB — MAGNESIUM: Magnesium: 2.2 mg/dL (ref 1.7–2.4)

## 2016-10-15 LAB — CALCIUM, IONIZED: CALCIUM, IONIZED, SERUM: 3.8 mg/dL — AB (ref 4.5–5.6)

## 2016-10-15 LAB — OSMOLALITY, URINE: OSMOLALITY UR: 389 mosm/kg (ref 300–900)

## 2016-10-15 LAB — SURGICAL PCR SCREEN
MRSA, PCR: NEGATIVE
Staphylococcus aureus: NEGATIVE

## 2016-10-15 LAB — PROTIME-INR
INR: 1.07
Prothrombin Time: 13.8 seconds (ref 11.4–15.2)

## 2016-10-15 LAB — PROCALCITONIN: PROCALCITONIN: 5.47 ng/mL

## 2016-10-15 LAB — SODIUM, URINE, RANDOM

## 2016-10-15 LAB — BRAIN NATRIURETIC PEPTIDE: B Natriuretic Peptide: 92.9 pg/mL (ref 0.0–100.0)

## 2016-10-15 LAB — HEPARIN LEVEL (UNFRACTIONATED)
HEPARIN UNFRACTIONATED: 0.36 [IU]/mL (ref 0.30–0.70)
Heparin Unfractionated: 0.26 IU/mL — ABNORMAL LOW (ref 0.30–0.70)

## 2016-10-15 MED ORDER — METHYLPREDNISOLONE SODIUM SUCC 125 MG IJ SOLR
80.0000 mg | Freq: Once | INTRAMUSCULAR | Status: AC
Start: 2016-10-15 — End: 2016-10-15
  Administered 2016-10-15: 80 mg via INTRAVENOUS
  Filled 2016-10-15: qty 2

## 2016-10-15 MED ORDER — PREDNISONE 20 MG PO TABS
30.0000 mg | ORAL_TABLET | Freq: Every day | ORAL | Status: DC
  Administered 2016-10-16: 30 mg via ORAL
  Filled 2016-10-15: qty 1

## 2016-10-15 MED ORDER — GLUCERNA SHAKE PO LIQD
237.0000 mL | Freq: Three times a day (TID) | ORAL | Status: DC
  Administered 2016-10-15 – 2016-10-20 (×10): 237 mL via ORAL

## 2016-10-15 MED ORDER — SALINE SPRAY 0.65 % NA SOLN
1.0000 | NASAL | Status: DC | PRN
  Filled 2016-10-15 (×2): qty 44

## 2016-10-15 MED ORDER — ALBUTEROL SULFATE (2.5 MG/3ML) 0.083% IN NEBU: 2.5000 mg | INHALATION_SOLUTION | Freq: Four times a day (QID) | RESPIRATORY_TRACT | Status: DC

## 2016-10-15 MED ORDER — SODIUM PHOSPHATES 45 MMOLE/15ML IV SOLN
20.0000 mmol | Freq: Once | INTRAVENOUS | Status: AC
  Administered 2016-10-15: 20 mmol via INTRAVENOUS
  Filled 2016-10-15: qty 6.67

## 2016-10-15 MED ORDER — HEPARIN (PORCINE) IN NACL 100-0.45 UNIT/ML-% IJ SOLN
1400.0000 [IU]/h | INTRAMUSCULAR | Status: DC
  Administered 2016-10-16: 1400 [IU]/h via INTRAVENOUS
  Filled 2016-10-15 (×2): qty 250

## 2016-10-15 MED ORDER — ALBUTEROL SULFATE (2.5 MG/3ML) 0.083% IN NEBU
2.5000 mg | INHALATION_SOLUTION | RESPIRATORY_TRACT | Status: DC | PRN
  Administered 2016-10-15: 2.5 mg via RESPIRATORY_TRACT
  Filled 2016-10-15: qty 3

## 2016-10-15 MED ORDER — GUAIFENESIN ER 600 MG PO TB12
600.0000 mg | ORAL_TABLET | Freq: Two times a day (BID) | ORAL | Status: DC
  Administered 2016-10-15 – 2016-10-21 (×12): 600 mg via ORAL
  Filled 2016-10-15 (×13): qty 1

## 2016-10-15 NOTE — Discharge Summary (Signed)
[facility] [place] NAME: Lisa [name]    MR#:  [id number]  DATE OF BIRTH:  May 10, 1950  DATE OF ADMISSION:  10/11/2016 ADMITTING PHYSICIAN: Nelva Bush, MD  DATE OF DISCHARGE: 10/13/2016 10:00 AM  PRIMARY CARE PHYSICIAN: Lenard Simmer, MD    ADMISSION DIAGNOSIS:  Ureteral colic [P37] Acute cystitis with hematuria [N30.01]  DISCHARGE DIAGNOSIS:  Principal Problem:   Cardiac arrest with ventricular fibrillation (HCC) Active Problems:   Non-ST elevation (NSTEMI) myocardial infarction [facility])   Renal stone   Leukocytosis   AKI (acute kidney injury) ([place])   Respiratory failure ([place])   Cardiogenic shock (HCC)   Anaphylaxis   Left ureteral stone   SECONDARY DIAGNOSIS:   Past Medical History:  Diagnosis Date  . CAD (coronary artery disease)    a. LHC 10/11/2016: LM 20%, oLAD 40%, p-mLAD 40% eccentric/calcified, dLAD 40%, p-mLCx 95%, eccentric, sev calcified, pRCA 30%, eccentric, sev calcified, mRCA lesion-1 90% focal & eccentric, sev calcified, mRCA lesion-2 99% sev calcified, EF 55-65%  . Nephrolithiasis   . NSTEMI (non-ST elevated myocardial infarction) ([place]) 10/11/2016  . Pneumonia 02/2016  . PVD (peripheral vascular disease) ([place])    a. Abdominal aortogram 10/11/16: Infrarenal abdominal aorta heavily calcified & ectatic with approximately 40-50% stenosis at the bifurcation. Mild diffuse disease w/ heavy calcification noted in the common & external iliac arteries bilaterally  . Type I diabetes mellitus ([place])   . Ventricular fibrillation ([place]) 10/11/2016   a. Felt to be secondary to demand ischemia in the setting of underlying CAD secondary to anaphylactic reaction from IV Rocephin    HOSPITAL COURSE:   * status post cardiac arrest and resuscitation VF Hypothermia protocol Likely secondary to anaphylactic reaction to ceftriaxone.  intubated on ventilatory support, further management per ICU  team. Extubated- 10/12/16, alert and oriented.  * elevated troponin Cardiac catheterization was done and noted double vessel disease,  Interventions will be at  [facility]. Heparin IV drip and aspirin for now. Echocardiogram.   Cardiologist accepted at [facility] for procedure and further management, transfer there.  * cardiogenic shock On Levophed drip, off now.  * UTI Ureteral stone Ciprofloxacillin IV for now.   No further needs by urology.  DISCHARGE CONDITIONS:   Stable.  CONSULTS OBTAINED:  Treatment Team:  Hollice Espy, MD  DRUG ALLERGIES:   Allergies  Allergen Reactions  . Ceftriaxone     Anaphylaxis, cardiac arrest    DISCHARGE MEDICATIONS:   Discharge Medication List as of 10/13/2016 10:33 AM    START taking these medications   Details  atorvastatin (LIPITOR) 80 MG tablet Take 1 tablet (80 mg total) by mouth daily at 6 PM., Starting Thu 10/13/2016, Normal    ciprofloxacin (CIPRO) 400 MG/200ML SOLN Inject 200 mLs (400 mg total) into the vein every 12 (twelve) hours., Starting Thu 10/13/2016, Normal    heparin 100-0.45 UNIT/ML-% infusion Inject 950 Units/hr into the vein continuous., Starting Thu 10/13/2016, Normal    insulin aspart (NOVOLOG) 100 UNIT/ML injection Inject 3 Units into the skin 3 (three) times daily with meals., Starting Thu 10/13/2016, Normal    insulin glargine (LANTUS) 100 UNIT/ML injection Inject 0.15 mLs (15 Units total) into the skin daily., Starting Thu 10/13/2016, Normal    ipratropium-albuterol (DUONEB) 0.5-2.5 (3) MG/3ML SOLN Take 3 mLs by nebulization every 4 (four) hours as needed., Starting Thu 10/13/2016, Normal      CONTINUE these medications which have NOT CHANGED   Details  ALPRAZolam (  XANAX) 0.25 MG tablet Take 0.25 mg by mouth daily., Historical Med    aspirin EC 81 MG tablet Take 81 mg by mouth daily., Historical Med    brompheniramine-pseudoephedrine-DM 30-2-10 MG/5ML syrup Take 5 mLs by mouth 4 (four)  times daily as needed., Starting Fri 05/29/2015, Normal    diltiazem (CARDIZEM CD) 120 MG 24 hr capsule Take 1 capsule by mouth daily., Starting Thu 09/29/2016, Historical Med    ezetimibe-simvastatin (VYTORIN) 10-20 MG tablet Take 1 tablet by mouth daily., Historical Med      STOP taking these medications     HUMALOG 100 UNIT/ML injection      sulfamethoxazole-trimethoprim (BACTRIM DS,SEPTRA DS) 800-160 MG tablet      Vitamin D, Ergocalciferol, (DRISDOL) 50000 units CAPS capsule          DISCHARGE INSTRUCTIONS:    Follow with Cardiologist as advised on discharge.  If you experience worsening of your admission symptoms, develop shortness of breath, life threatening emergency, suicidal or homicidal thoughts you must seek medical attention immediately by calling 911 or calling your MD immediately  if symptoms less severe.  You Must read complete instructions/literature along with all the possible adverse reactions/side effects for all the Medicines you take and that have been prescribed to you. Take any new Medicines after you have completely understood and accept all the possible adverse reactions/side effects.   Please note  You were cared for by a hospitalist during your hospital stay. If you have any questions about your discharge medications or the care you received while you were in the hospital after you are discharged, you can call the unit and asked to speak with the hospitalist on call if the hospitalist that took care of you is not available. Once you are discharged, your primary care physician will handle any further medical issues. Please note that NO REFILLS for any discharge medications will be authorized once you are discharged, as it is imperative that you return to your primary care physician (or establish a relationship with a primary care physician if you do not have one) for your aftercare needs so that they can reassess your need for medications and monitor your lab  values.    Today   CHIEF COMPLAINT:   Chief Complaint  Patient presents with  . Flank Pain    HISTORY OF PRESENT ILLNESS:  Lisa [name]  is a 66 y.o. female with a known history of diabetes came to emergency room with flank pain, nausea, given injection of Rocephin for possible UTI in ER and immediately she had complain of funny smell , metallic taste and feeling uneasy. Followed by bradycardia and cardiac arrest. CPR was done 45 minutes, intubated and after return of circulation, cardiologist was called in due to abnormal-looking EKG and they decided to take her to catheterization immediately. Patient was seen post cardiac cheterization in Cath Lab, still intubated and not able to give any details.   VITAL SIGNS:  Blood pressure 137/64, pulse (!) 108, temperature 98.6 F (37 C), temperature source Oral, resp. rate (!) 29, height 5\' 3"  (1.6 m), weight 67.6 kg (149 lb 0.5 oz), SpO2 93 %.  I/O:  No intake or output data in the 24 hours ending 10/15/16 1220  PHYSICAL EXAMINATION:  GENERAL:  66 y.o.-year-old patient lying in the bed with no acute distress.  EYES: Pupils equal, round, reactive to light and accommodation. No scleral icterus. Extraocular muscles intact.  HEENT: Head atraumatic, normocephalic. Oropharynx and nasopharynx clear.  NECK:  Supple,  no jugular venous distention. No thyroid enlargement, no tenderness.  LUNGS: Normal breath sounds bilaterally, no wheezing, rales,rhonchi or crepitation. No use of accessory muscles of respiration.  CARDIOVASCULAR: S1, S2 normal. No murmurs, rubs, or gallops.  ABDOMEN: Soft, non-tender, non-distended. Bowel sounds present. No organomegaly or mass.  EXTREMITIES: No pedal edema, cyanosis, or clubbing.  NEUROLOGIC: Cranial nerves II through XII are intact. Muscle strength 5/5 in all extremities. Sensation intact. Gait not checked.  PSYCHIATRIC: The patient is alert and oriented x 3.  SKIN: No obvious rash, lesion, or ulcer.   DATA  REVIEW:   CBC  Recent Labs Lab 10/15/16 0234  WBC 26.4*  HGB 8.9*  HCT 26.5*  PLT 211    Chemistries   Recent Labs Lab 10/14/16 0442  10/15/16 0234  NA 121*  < > 122*  K 3.0*  < > 3.8  CL 86*  < > 88*  CO2 22  < > 27  GLUCOSE 245*  < > 196*  BUN 12  < > 11  CREATININE 0.84  < > 0.61  CALCIUM 6.6*  < > 6.9*  MG 1.8  < > 2.2  AST 87*  --   --   ALT 55*  --   --   ALKPHOS 75  --   --   BILITOT 1.3*  --   --   < > = values in this interval not displayed.  Cardiac Enzymes  Recent Labs Lab 10/12/16 0455  TROPONINI 3.19*    Microbiology Results  Results for orders placed or performed during the hospital encounter of 10/11/16  Urine culture     Status: Abnormal   Collection Time: 10/11/16  7:11 AM  Result Value Ref Range Status   Specimen Description URINE, CLEAN CATCH  Final   Special Requests Normal  Final   Culture (A)  Final    >=100,000 COLONIES/mL KLEBSIELLA PNEUMONIAE >=100,000 COLONIES/mL ESCHERICHIA COLI    Report Status 10/14/2016 FINAL  Final   Organism ID, Bacteria KLEBSIELLA PNEUMONIAE (A)  Final   Organism ID, Bacteria ESCHERICHIA COLI (A)  Final      Susceptibility   Escherichia coli - MIC*    AMPICILLIN >=32 RESISTANT Resistant     CEFAZOLIN <=4 SENSITIVE Sensitive     CEFTRIAXONE <=1 SENSITIVE Sensitive     CIPROFLOXACIN <=0.25 SENSITIVE Sensitive     GENTAMICIN <=1 SENSITIVE Sensitive     IMIPENEM <=0.25 SENSITIVE Sensitive     NITROFURANTOIN <=16 SENSITIVE Sensitive     TRIMETH/SULFA <=20 SENSITIVE Sensitive     AMPICILLIN/SULBACTAM >=32 RESISTANT Resistant     PIP/TAZO <=4 SENSITIVE Sensitive     Extended ESBL NEGATIVE Sensitive     * >=100,000 COLONIES/mL ESCHERICHIA COLI   Klebsiella pneumoniae - MIC*    AMPICILLIN >=32 RESISTANT Resistant     CEFAZOLIN <=4 SENSITIVE Sensitive     CEFTRIAXONE <=1 SENSITIVE Sensitive     CIPROFLOXACIN <=0.25 SENSITIVE Sensitive     GENTAMICIN <=1 SENSITIVE Sensitive     IMIPENEM <=0.25 SENSITIVE  Sensitive     NITROFURANTOIN 64 INTERMEDIATE Intermediate     TRIMETH/SULFA <=20 SENSITIVE Sensitive     AMPICILLIN/SULBACTAM 4 SENSITIVE Sensitive     PIP/TAZO <=4 SENSITIVE Sensitive     Extended ESBL NEGATIVE Sensitive     * >=100,000 COLONIES/mL KLEBSIELLA PNEUMONIAE  MRSA PCR Screening     Status: None   Collection Time: 10/11/16  4:35 PM  Result Value Ref Range Status   MRSA by PCR NEGATIVE  NEGATIVE Final    Comment:        The GeneXpert MRSA Assay (FDA approved for NASAL specimens only), is one component of a comprehensive MRSA colonization surveillance program. It is not intended to diagnose MRSA infection nor to guide or monitor treatment for MRSA infections.   Culture, blood (Routine X 2) w Reflex to ID Panel     Status: None (Preliminary result)   Collection Time: 10/12/16  6:53 AM  Result Value Ref Range Status   Specimen Description BLOOD RIGHT HAND  Final   Special Requests   Final    BOTTLES DRAWN AEROBIC AND ANAEROBIC Blood Culture results may not be optimal due to an inadequate volume of blood received in culture bottles   Culture NO GROWTH 3 DAYS  Final   Report Status PENDING  Incomplete  Culture, blood (Routine X 2) w Reflex to ID Panel     Status: None (Preliminary result)   Collection Time: 10/12/16  6:53 AM  Result Value Ref Range Status   Specimen Description BLOOD LEFT HAND  Final   Special Requests   Final    BOTTLES DRAWN AEROBIC AND ANAEROBIC Blood Culture results may not be optimal due to an inadequate volume of blood received in culture bottles   Culture NO GROWTH 3 DAYS  Final   Report Status PENDING  Incomplete    RADIOLOGY:  US Renal  Result Date: 10/14/2016 CLINICAL DATA:  Kidney stone.  History of cardiac arrest. EXAM: RENAL / URINARY TRACT ULTRASOUND COMPLETE COMPARISON:  Abdominal CT 10/11/2016 FINDINGS: Right Kidney: Length: 11.5 cm. 2.2 cm hilar cyst. No hydronephrosis. Borderline hyperechoic renal cortex. Left Kidney: Length: 12.5 cm.  Mild hydronephrosis, improved from CT 3 days ago. No evidence of mass. Bladder: Decompressed by Foley catheter. IMPRESSION: Mild left hydronephrosis, improved from CT 3 days ago. Electronically Signed   By: Monte Fantasia M.D.   On: 10/14/2016 09:05   Dg Chest Port 1 View  Result Date: 10/15/2016 CLINICAL DATA:  Followup acute respiratory failure. EXAM: PORTABLE CHEST 1 VIEW COMPARISON:  10/14/2016 FINDINGS: Widespread bilateral pneumonia persists, with slight radiographic improvement. Small amount of a fusion and focal volume loss in the right lower lobe. No worsening or new findings. IMPRESSION: Radiographic improvement of diffuse pneumonia. Some persistent volume loss at the right base. Electronically Signed   By: Nelson Chimes M.D.   On: 10/15/2016 07:49   Dg Chest Port 1 View  Result Date: 10/14/2016 CLINICAL DATA:  66 y/o  F; respiratory distress. EXAM: PORTABLE CHEST 1 VIEW COMPARISON:  10/13/2016 chest radiograph FINDINGS: Asymmetric consolidations of the lungs are stable from prior chest radiographs. Small to moderate right pleural effusion. Stable cardiac silhouette within normal limits. Aortic atherosclerosis with calcification. No acute osseous abnormality. IMPRESSION: Stable asymmetric consolidations throughout the lungs and small to moderate right effusion. Differential includes multifocal pneumonia and pulmonary edema. Electronically Signed   By: Kristine Garbe M.D.   On: 10/14/2016 01:30   Dg Chest Port 1 View  Result Date: 10/13/2016 CLINICAL DATA:  Shortness of breath today EXAM: PORTABLE CHEST 1 VIEW COMPARISON:  Earlier today FINDINGS: Continued progression of bilateral airspace disease, asymmetric to the left. Normal heart size for technique. Negative mediastinal contours. Small if any pleural effusions. No pneumothorax. IMPRESSION: Progression of asymmetric bilateral airspace disease favoring pneumonia or noncardiogenic edema (was there aspiration at time of cardiac  arrest?). Electronically Signed   By: Monte Fantasia M.D.   On: 10/13/2016 13:44   Dg Abd Portable  1v  Result Date: 10/14/2016 CLINICAL DATA:  Ureteral calculus, abdominal pain EXAM: PORTABLE ABDOMEN - 1 VIEW COMPARISON:  CT abdomen and pelvis 10/11/2016 FINDINGS: LEFT pelvic phleboliths again identified. Distal LEFT ureteral calculus seen on prior CT exam is not visualized on current study. RIGHT femoral line noted. Bowel gas pattern normal. Diffuse osseous demineralization. IMPRESSION: Previously identified distal LEFT ureteral calculus is no longer seen. Electronically Signed   By: Lavonia Dana M.D.   On: 10/14/2016 07:40    EKG:   Orders placed or performed during the hospital encounter of 10/11/16  . EKG 12-Lead  . EKG 12-Lead  . EKG 12-Lead  . EKG 12-Lead  . EKG 12-Lead  . EKG 12-Lead      Management plans discussed with the patient, family and they are in agreement.  CODE STATUS:  Code Status History    Date Active Date Inactive Code Status Order ID Comments User Context   10/11/2016 12:26 PM 10/13/2016 10:48 AM Full Code [id number]  Nelva Bush, MD Inpatient   10/11/2016 11:15 AM 10/11/2016 12:26 PM Full Code [id number]  Wilhelmina Mcardle, MD Inpatient      TOTAL TIME TAKING CARE OF THIS PATIENT: 35 minutes.    Vaughan Basta M.D on 10/15/2016 at 12:20 PM  Between 7am to 6pm - Pager - [phone]  After 6pm go to [email or web address] - password [email or web address] [place] [facility]  Office  [phone]  CC: Primary care physician; Lenard Simmer, MD   Note: This dictation was prepared with Dragon dictation along with smaller phrase technology. Any transcriptional errors that result from this process are unintentional.

## 2016-10-15 NOTE — Progress Notes (Signed)
[place] [facility] Attending Consult  Physician Requesting Consult:  Fransico Him, M.D. / Cardiology  Date of Consult:  10/14/2016  Reason for Consult/Chief Complaint:  Acute Hypoxic Respiratory Failure  History of Presenting Illness:  66 y.o. female, smoker, who presented to outside hospital on 9/11 with left flank pain. Imaging revealed a left ureteral stone as well as hydronephrosis. Rocephin was administered and very shortly after the patient developed a metallic taste and suffered cardiac arrest. Patient underwent resuscitation for approximately 45 minutes.er cardiology documentation the patient required multiple shocks with ventricular fibrillation as the underlying rhythm problem. Patient reportedly was in complete heart block with a wide complex rhythm that prompted patient to undergo emergent left heart catheterization. She was subsequently found to have two-vessel coronary artery disease. Initially patient was on transcutaneous pacing. Patient was placed on normothermia protocol. Patient was subsequently extubated on 9/12 and per the electronic medical record began having increased shortness of breath with desaturation and crackles around 3 AM on 9/13. Lasix was ordered. Oxygen requirement increased from 2 L/m to 5 L/m to maintain saturation greater than 90%. Patient had significant diuresis with that dose of Lasix. Notably the patient also developed worsening hyponatremia as well. Patient's daughter reports that her degree of facial edema and hand swelling have dramatically improved after receiving IV Lasix. Patient denies any subjective fever, chills, or sweats. She continues to have significant chest wall pain from her previous CPR. Additionally, she has an intermittent, nonproductive cough. Reports her dyspnea has slowly worsened since her extubation on 9/12 without any perceived acute worsening.    Subjective:  Pt reports cough improving / loosening up.  No acute events  overnight.    Temp:  [97.6 F (36.4 C)-98.7 F (37.1 C)] 98 F (36.7 C) (09/15 0816) Pulse Rate:  [101-116] 105 (09/15 0625) Resp:  [22-37] 23 (09/15 0625) BP: (120-170)/(60-82) 143/70 (09/15 0625) SpO2:  [84 %-100 %] 100 % (09/15 0754) FiO2 (%):  [100 %] 100 % (09/15 0754) Weight:  [137 lb 12.6 oz (62.5 kg)] 137 lb 12.6 oz (62.5 kg) (09/15 0600)  General: well developed adult female in NAD HEENT: MM pink/moist, no jvd PSY: calm/appropriate  Neuro: AAOx4, speech clear, MAE  CV: s1s2 rrr, no m/r/g PULM: even/non-labored, lungs bilaterally with diffuse wheezing KP:TWSF, non-tender, bsx4 active  Extremities: warm/dry, no edema  Skin: no rashes or lesions   LINES/TUBES: OETT 9/11 - 9/12 R FEM CVL 9/11 >>> 9/11 Foley >>>  CBC Latest Ref Rng & Units 10/15/2016 10/14/2016 10/13/2016  WBC 4.0 - 10.5 K/uL 26.4(H) 27.2(H) 28.3(H)  Hemoglobin 12.0 - 15.0 g/dL 8.9(L) 9.9(L) 10.7(L)  Hematocrit 36.0 - 46.0 % 26.5(L) 29.4(L) 31.4(L)  Platelets 150 - 400 K/uL 211 216 241   BMP Latest Ref Rng & Units 10/15/2016 10/14/2016 10/14/2016  Glucose 65 - 99 mg/dL 196(H) 185(H) 245(H)  BUN 6 - 20 mg/dL 11 10 12   Creatinine 0.44 - 1.00 mg/dL 0.61 0.59 0.84  Sodium 135 - 145 mmol/L 122(L) 124(L) 121(L)  Potassium 3.5 - 5.1 mmol/L 3.8 3.4(L) 3.0(L)  Chloride 101 - 111 mmol/L 88(L) 90(L) 86(L)  CO2 22 - 32 mmol/L 27 25 22   Calcium 8.9 - 10.3 mg/dL 6.9(L) 6.8(L) 6.6(L)    Hepatic Function Latest Ref Rng & Units 10/15/2016 10/14/2016 10/13/2016  Total Protein 6.5 - 8.1 g/dL - 5.6(L) 5.3(L)  Albumin 3.5 - 5.0 g/dL 2.3(L) 2.6(L) 2.6(L)  AST 15 - 41 U/L - 87(H) 133(H)  ALT 14 - 54 U/L -  55(H) 64(H)  Alk Phosphatase 38 - 126 U/L - 75 71  Total Bilirubin 0.3 - 1.2 mg/dL - 1.3(H) 0.9    IMAGING/STUDIES: CT RENAL/STONE STUDY 9/11: IMPRESSION: 1. 4 mm distal left ureteral calculus with mild hydroureteronephrosis. 2. Punctate nonobstructing bilateral renal calculi. 3. Small hiatal hernia. 4. Aortic  Atherosclerosis (ICD10-I70.0). Mild infrarenal aortic ectasia and suspected short segment aortic dissection. LHC 9/11: 1. Severe 2 vessel coronary artery disease, including heavily calcified 95% proximal/mid LCx and sequential 90-99% mid RCA stenoses. 2. Mild to moderate, nonobstructive disease involving the LMCA and LAD. 3. Overall preserved LV contraction with basal inferior hypokinesis. 4. Upper normal to mildly elevated left ventricular filling pressure (LVEDP 15-20 mmHg). 5. Peripheral vascular disease with ectasia and calcification of the infrarenal abdominal aorta and iliac arteries. TTE 9/12:  LV normal in size with EF 55-60% & normal regional wall motion grossly but unable to exclude hypokinesis of inferior wall. Normal diastolic function. LA & RA normal in size. RV normal in size and function. Aortic valve poorly visualized but without obvious stenosis or regurgitation. Aortic root normal in size. No mitral stenosis or regurgitation. No pulmonic stenosis. Mild tricuspid regurgitation. No pericardial effusion. RENAL U/S 9/14: Mild left hydronephrosis, improved from CT 3 days ago.  PORT CXR 9/14: Endotracheal tube removed. Patchy bilateral alveolar and interstitial opacities with some suggestion of consolidation on the left. These findings are worse on the left compared with the right. Blunting of right costophrenic angle with meniscus suggestive of pleural effusion. Spirometry 9/14:  FVC 0.95 L (31%) FEV1 0.79 L (34%) FEV1/FVC 0.84,  FEF 25-75 0.86 L (41%)  MICROBIOLOGY: MRSA PCR 9/11:  Negative Urine Culture 9/11:  E coli & Klebisella pneumoniae (both sensitive to Cipro) Blood Cultures x2 9/12 >>>  ANTIBIOTICS: Rocephin 9/11 (possible anaphylaxis after 1 dose) Cipro 9/11 >>>  SIGNIFICANT EVENTS: 09/11 - Admit to [facility] after cardiac arrest >> started normothermia protocol & underwent LHC. 09/13 - Increased SOB & hypoxia (2L >> 5L) around 3am>>Lasix given. Transfer from [facility] to  [facility]. 09/14 - PCCM consult for hypoxia & possible multi-focal pneumonia. Additional lasix given overnight.  ASSESSMENT/PLAN:  66 y.o. female admitted with urinary tract infection and left nephrolithiasis with some element of hydronephrosis. Patient suffered ventricular fibrillation cardiac arrest after Rocephin in the emergency department. Neurologically the patient seems to have recovered / intact.  She is currently being evaluated for treatment of her underlying coronary artery disease. Suspect her hypoxia is more related to atelectasis, volume overload, and some element of lung injury from her resuscitation efforts. Given her improving Procalcitonin and leukocytosis as well as her clinical history I'm less suspicious about an evolving healthcare associated pneumonia. Even so, this must be watched closely. Concern her hyponatremia is secondary to low intracellular potassium further depleted by IV Lasix resulting in subsequent electrolyte shifts and hyponatremia.   Acute hypoxic respiratory failure - likely multifactorial in etiology with post arrest atelectasis, underlying undiagnosed COPD and edema vs PNA.   Obstructive Lung Disease with Acute Exacerbation Tobacco Abuse   P: Wean O2 for sats > 90% Pulmonary hygiene - IS, mobilize Intermittent CXR > improve 9/15  Will need ambulatory O2 assessment prior to discharge  Continue tessalon perles for cough  Duoneb Q6 + PRN albuterol  Add prednisone 84m QD for bronchospasm Nasal saline for comfort Smoking cessation counseling   Bilateral lung opacities - suspect multifactorial in etiology.  P:  Follow intermittent CXR    Hyponatremia & hypokalemia  P: Trend BMP  Monitor UOP  Assess urine sodium / osmolality  Hold further lasix  NaPhos 9/15  Noe Gens, NP-C [place] [facility] Pgr: [phone] or if no answer [phone] 10/15/2016, 10:56 AM

## 2016-10-15 NOTE — Progress Notes (Signed)
Procedure(s) (LRB): CORONARY ARTERY BYPASS GRAFTING (CABG) (N/A) TRANSESOPHAGEAL ECHOCARDIOGRAM (TEE) (N/A) Subjective: CXR clearing, WBC better  needs to ambulate, improve hyponatremia before CABG next week Consider tolvaptan ADH receptor inhibitor Objective: Vital signs in last 24 hours: Temp:  [97.6 F (36.4 C)-98.7 F (37.1 C)] 98 F (36.7 C) (09/15 0816) Pulse Rate:  [101-116] 105 (09/15 0625) Cardiac Rhythm: Sinus tachycardia (09/15 0400) Resp:  [22-37] 23 (09/15 0625) BP: (120-170)/(60-82) 143/70 (09/15 0625) SpO2:  [84 %-100 %] 100 % (09/15 0754) FiO2 (%):  [100 %] 100 % (09/15 0754) Weight:  [137 lb 12.6 oz (62.5 kg)] 137 lb 12.6 oz (62.5 kg) (09/15 0600)  Hemodynamic parameters for last 24 hours:  nsr  Intake/Output from previous day: 09/14 0701 - 09/15 0700 In: 918.5 [P.O.:540; I.V.:272.5; IV Piggyback:106] Out: 2775 [Urine:2775] Intake/Output this shift: No intake/output data recorded.    Lab Results:  Recent Labs  10/14/16 0442 10/15/16 0234  WBC 27.2* 26.4*  HGB 9.9* 8.9*  HCT 29.4* 26.5*  PLT 216 211   BMET:  Recent Labs  10/14/16 1347 10/15/16 0234  NA 124* 122*  K 3.4* 3.8  CL 90* 88*  CO2 25 27  GLUCOSE 185* 196*  BUN 10 11  CREATININE 0.59 0.61  CALCIUM 6.8* 6.9*    PT/INR:  Recent Labs  10/15/16 0234  LABPROT 13.8  INR 1.07   ABG    Component Value Date/Time   PHART 7.349 (L) 10/15/2016 0345   HCO3 28.1 (H) 10/15/2016 0345   ACIDBASEDEF 12.1 (H) 10/11/2016 1530   O2SAT 98.1 10/15/2016 0345   CBG (last 3)   Recent Labs  10/14/16 2005 10/14/16 2127 10/15/16 0808  GLUCAP 241* 239* 206*    Assessment/Plan: S/P Procedure(s) (LRB): CORONARY ARTERY BYPASS GRAFTING (CABG) (N/A) TRANSESOPHAGEAL ECHOCARDIOGRAM (TEE) (N/A) surg next week when improved   LOS: 2 days    Lisa [name] 10/15/2016

## 2016-10-15 NOTE — Progress Notes (Signed)
ANTICOAGULATION CONSULT NOTE - Follow Up Consult  Pharmacy Consult for Heparin Indication: severe multivessel CAD  Allergies  Allergen Reactions  . Ceftriaxone     Anaphylaxis, cardiac arrest    Patient Measurements: Height: 5\' 3"  (160 cm) Weight: 137 lb 12.6 oz (62.5 kg) IBW/kg (Calculated) : 52.4  Vital Signs: Temp: 97.7 F (36.5 C) (09/15 1151) Temp Source: Oral (09/15 1151) BP: 143/70 (09/15 0625) Pulse Rate: 105 (09/15 0625)  Labs:  Recent Labs  10/13/16 0452  10/14/16 0442 10/14/16 1347 10/14/16 2157 10/15/16 0234 10/15/16 1132  HGB 10.7*  --  9.9*  --   --  8.9*  --   HCT 31.4*  --  29.4*  --   --  26.5*  --   PLT 241  --  216  --   --  211  --   LABPROT  --   --  13.9  --   --  13.8  --   INR  --   --  1.08  --   --  1.07  --   HEPARINUNFRC  --   < > 0.21* 0.16* 0.32 0.26* 0.36  CREATININE 0.80  < > 0.84 0.59  --  0.61  --   < > = values in this interval not displayed.  Estimated Creatinine Clearance: 58.8 mL/min (by C-G formula based on SCr of 0.61 mg/dL).   Medications:  . heparin 1,400 Units/hr (10/15/16 0449)  . sodium phosphate  Dextrose 5% IVPB       Assessment: 65yof s/p cardiac arrest continues on heparin for severe multivessel CAD awaiting CABG next week. Heparin level is therapeutic at 0.36.   Goal of Therapy:  Heparin level 0.3-0.7 units/ml Monitor platelets by anticoagulation protocol: Yes   Plan:  1) Continue heparin at 1400 units/hr 2) Follow up daily heparin level and CBC  Uvaldo Rising, BCPS  Clinical Pharmacist Pager [phone]  10/15/2016 12:31 PM

## 2016-10-15 NOTE — Progress Notes (Signed)
Patient ID: Lisa [name], female   DOB: 1950-10-07, 66 y.o.   MRN: [id number]  PROGRESS NOTE    Lisa [name]  SWF:[id number] DOB: 04/03/1950 DOA: 10/13/2016 PCP: Lenard Simmer, MD   Brief Narrative: 66 year old female was transferred from [facility] on 10/13/2016 after being initially admitted there on 10/11/2016 with flank pain secondary to UTI and nephrolithiasis for which patient received IV Rocephin and add V. fib arrest afterwards. She was intubated and placed on hypothermia protocol, had cardiac catheterization which showed severe 2 vessel coronary artery disease. She was subsequently extubated. Urology as evaluated the patient as well who is planning to repeat renal ultrasound. Patient is currently on ciprofloxacin for UTI. Patient was transferred to [facility] on 10/13/2016 for CVTS  evaluation for probable CABG. Hospitalist service was consulted for medical management. Patient has been intermittently hypoxic requiring higher oxygen requirement and has received a few doses of Lasix as well. Patient is also being evaluated by CCM.  Assessment & Plan:   Principal Problem:   Cardiac arrest with ventricular fibrillation (HCC) Active Problems:   Non-ST elevation (NSTEMI) myocardial infarction (HCC)   Leukocytosis   Respiratory failure (HCC)   Cardiogenic shock (HCC)   Anaphylaxis   Left ureteral stone   Shock liver   Hypomagnesemia   Ventricular fibrillation (HCC)   Hyponatremia   Acute respiratory failure (HCC)   Coronary artery disease involving native heart without angina pectoris   Kidney stone   Acute respiratory failure -Patient still requiring intermittently high oxygen requirement. CCM following the patient - Patient is getting intermittent Lasix. - wean O2 as tolerated -Defer antibiotic management for pulmonary team as chest x-ray still showing opacities secondary to pulmonary edema versus consolidation.  Cardiac arrest due to v  fib due to anaphylaxis -avoid penicillin/cephalosporins - Being managed by cardiology  2 vessel coronary artery disease - Management as per primary cardiology team. CVTS following an planning for surgery next week   UTI -Urine culture is growing Escherichia coli/Klebsiella. Continue oral ciprofloxacin  Right ureteral calculus - Urology following   Hyponatremia -suspect from volume overload vs excessive PO water intake -Sodium 122 today. If sodium level does not improve, consider nephrology evaluation  DM type 1 -uses pump at home -Continue lantus and SSI/novolog  Shock liver -LFTs improving  Leukocytosis -trending down -?stress  Hypokalemia - Improving  Hypomagnesemia - Improved  Patient is critically ill in ICU and is being followed by CCM along with cardiology and CVTS. Hospitalist service will sign off for now. Please recall [place] if needed   Subjective: Patient seen and examined at bedside. She complains of shortness of breath and cough. No current chest pain, nausea or vomiting.  Objective: Vitals:   10/15/16 0600 10/15/16 0625 10/15/16 0754 10/15/16 0816  BP: (!) 169/82 (!) 143/70    Pulse: (!) 112 (!) 105    Resp: (!) 37 (!) 23    Temp:    98 F (36.7 C)  TempSrc:    Oral  SpO2: (!) 84% 100% 100%   Weight: 62.5 kg (137 lb 12.6 oz)     Height:        Intake/Output Summary (Last 24 hours) at 10/15/16 0951 Last data filed at 10/15/16 0600  Gross per 24 hour  Intake           858.49 ml  Output             2775 ml  Net         -  1916.51 ml   Filed Weights   10/13/16 1100 10/14/16 0500 10/15/16 0600  Weight: 67.5 kg (148 lb 13 oz) 63.9 kg (140 lb 14 oz) 62.5 kg (137 lb 12.6 oz)    Examination:  General exam: Appears Slightly uncomfortable   Respiratory system: Bilateral decreased breath sound at bases with scattered crackles No wheezing Cardiovascular system: S1 & S2 heard, intermittently tachycardic  Gastrointestinal system: Abdomen is  nondistended, soft and nontender. Normal bowel sounds heard. Extremities: No cyanosis, clubbing, edema    Data Reviewed: I have personally reviewed following labs and imaging studies  CBC:  Recent Labs Lab 10/11/16 0711 10/12/16 0455 10/12/16 0653 10/13/16 0452 10/14/16 0442 10/15/16 0234  WBC 13.5* 51.8* 51.3* 28.3* 27.2* 26.4*  NEUTROABS 11.5*  --  46.8*  --   --   --   HGB 12.8 12.7 13.3 10.7* 9.9* 8.9*  HCT 38.0 38.6 41.1 31.4* 29.4* 26.5*  MCV 82.3 84.7 85.1 83.1 79.7 79.8  PLT 353 329 348 241 216 220   Basic Metabolic Panel:  Recent Labs Lab 10/11/16 1651  10/13/16 0452 10/13/16 0829 10/13/16 1217 10/13/16 1500 10/14/16 0442 10/14/16 1347 10/15/16 0234  NA  --   < > 125* 123*  --   --  121* 124* 122*  K  --   < > 3.7 3.4*  --   --  3.0* 3.4* 3.8  CL  --   < > 96* 94*  --   --  86* 90* 88*  CO2  --   < > 21* 20*  --   --  22 25 27   GLUCOSE  --   < > 252* 240*  --   --  245* 185* 196*  BUN  --   < > 20 18  --   --  12 10 11   CREATININE  --   < > 0.80 0.70  --   --  0.84 0.59 0.61  CALCIUM  --   < > 6.6* 6.4*  --   --  6.6* 6.8* 6.9*  MG 1.8  --  1.1*  --  1.6* 2.3 1.8 1.7 2.2  PHOS 3.3  --   --   --   --   --   --   --  1.4*  < > = values in this interval not displayed. GFR: Estimated Creatinine Clearance: 58.8 mL/min (by C-G formula based on SCr of 0.61 mg/dL). Liver Function Tests:  Recent Labs Lab 10/11/16 0711 10/13/16 0452 10/14/16 0442 10/15/16 0234  AST 22 133* 87*  --   ALT 21 64* 55*  --   ALKPHOS 80 71 75  --   BILITOT 0.6 0.9 1.3*  --   PROT 7.5 5.3* 5.6*  --   ALBUMIN 3.8 2.6* 2.6* 2.3*    Recent Labs Lab 10/11/16 0711  LIPASE 18   No results for input(s): AMMONIA in the last 168 hours. Coagulation Profile:  Recent Labs Lab 10/11/16 1237 10/14/16 0442 10/15/16 0234  INR 1.72 1.08 1.07   Cardiac Enzymes:  Recent Labs Lab 10/11/16 1237 10/11/16 1651 10/11/16 2306 10/12/16 0455  TROPONINI 7.82* 7.77* 5.68* 3.19*    BNP (last 3 results) No results for input(s): PROBNP in the last 8760 hours. HbA1C:  Recent Labs  10/15/16 0234  HGBA1C 7.0*   CBG:  Recent Labs Lab 10/14/16 1225 10/14/16 1552 10/14/16 2005 10/14/16 2127 10/15/16 0808  GLUCAP 225* 179* 241* 239* 206*   Lipid Profile:  Recent Labs  10/13/16  0452  CHOL 81  HDL 34*  LDLCALC 36  TRIG 56  CHOLHDL 2.4   Thyroid Function Tests:  Recent Labs  10/15/16 0234  TSH 1.204   Anemia Panel: No results for input(s): VITAMINB12, FOLATE, FERRITIN, TIBC, IRON, RETICCTPCT in the last 72 hours. Sepsis Labs:  Recent Labs Lab 10/13/16 1446 10/14/16 0442 10/15/16 0234  PROCALCITON 20.66 11.78 5.47    Recent Results (from the past 240 hour(s))  Urine culture     Status: Abnormal   Collection Time: 10/11/16  7:11 AM  Result Value Ref Range Status   Specimen Description URINE, CLEAN CATCH  Final   Special Requests Normal  Final   Culture (A)  Final    >=100,000 COLONIES/mL KLEBSIELLA PNEUMONIAE >=100,000 COLONIES/mL ESCHERICHIA COLI    Report Status 10/14/2016 FINAL  Final   Organism ID, Bacteria KLEBSIELLA PNEUMONIAE (A)  Final   Organism ID, Bacteria ESCHERICHIA COLI (A)  Final      Susceptibility   Escherichia coli - MIC*    AMPICILLIN >=32 RESISTANT Resistant     CEFAZOLIN <=4 SENSITIVE Sensitive     CEFTRIAXONE <=1 SENSITIVE Sensitive     CIPROFLOXACIN <=0.25 SENSITIVE Sensitive     GENTAMICIN <=1 SENSITIVE Sensitive     IMIPENEM <=0.25 SENSITIVE Sensitive     NITROFURANTOIN <=16 SENSITIVE Sensitive     TRIMETH/SULFA <=20 SENSITIVE Sensitive     AMPICILLIN/SULBACTAM >=32 RESISTANT Resistant     PIP/TAZO <=4 SENSITIVE Sensitive     Extended ESBL NEGATIVE Sensitive     * >=100,000 COLONIES/mL ESCHERICHIA COLI   Klebsiella pneumoniae - MIC*    AMPICILLIN >=32 RESISTANT Resistant     CEFAZOLIN <=4 SENSITIVE Sensitive     CEFTRIAXONE <=1 SENSITIVE Sensitive     CIPROFLOXACIN <=0.25 SENSITIVE Sensitive      GENTAMICIN <=1 SENSITIVE Sensitive     IMIPENEM <=0.25 SENSITIVE Sensitive     NITROFURANTOIN 64 INTERMEDIATE Intermediate     TRIMETH/SULFA <=20 SENSITIVE Sensitive     AMPICILLIN/SULBACTAM 4 SENSITIVE Sensitive     PIP/TAZO <=4 SENSITIVE Sensitive     Extended ESBL NEGATIVE Sensitive     * >=100,000 COLONIES/mL KLEBSIELLA PNEUMONIAE  MRSA PCR Screening     Status: None   Collection Time: 10/11/16  4:35 PM  Result Value Ref Range Status   MRSA by PCR NEGATIVE NEGATIVE Final    Comment:        The GeneXpert MRSA Assay (FDA approved for NASAL specimens only), is one component of a comprehensive MRSA colonization surveillance program. It is not intended to diagnose MRSA infection nor to guide or monitor treatment for MRSA infections.   Culture, blood (Routine X 2) w Reflex to ID Panel     Status: None (Preliminary result)   Collection Time: 10/12/16  6:53 AM  Result Value Ref Range Status   Specimen Description BLOOD RIGHT HAND  Final   Special Requests   Final    BOTTLES DRAWN AEROBIC AND ANAEROBIC Blood Culture results may not be optimal due to an inadequate volume of blood received in culture bottles   Culture NO GROWTH 3 DAYS  Final   Report Status PENDING  Incomplete  Culture, blood (Routine X 2) w Reflex to ID Panel     Status: None (Preliminary result)   Collection Time: 10/12/16  6:53 AM  Result Value Ref Range Status   Specimen Description BLOOD LEFT HAND  Final   Special Requests   Final    BOTTLES DRAWN AEROBIC AND  ANAEROBIC Blood Culture results may not be optimal due to an inadequate volume of blood received in culture bottles   Culture NO GROWTH 3 DAYS  Final   Report Status PENDING  Incomplete  Surgical pcr screen     Status: None   Collection Time: 10/14/16  7:31 PM  Result Value Ref Range Status   MRSA, PCR NEGATIVE NEGATIVE Final   Staphylococcus aureus NEGATIVE NEGATIVE Final    Comment: (NOTE) The Xpert SA Assay (FDA approved for NASAL specimens in  patients 60 years of age and older), is one component of a comprehensive surveillance program. It is not intended to diagnose infection nor to guide or monitor treatment.          Radiology Studies: US Renal  Result Date: 10/14/2016 CLINICAL DATA:  Kidney stone.  History of cardiac arrest. EXAM: RENAL / URINARY TRACT ULTRASOUND COMPLETE COMPARISON:  Abdominal CT 10/11/2016 FINDINGS: Right Kidney: Length: 11.5 cm. 2.2 cm hilar cyst. No hydronephrosis. Borderline hyperechoic renal cortex. Left Kidney: Length: 12.5 cm. Mild hydronephrosis, improved from CT 3 days ago. No evidence of mass. Bladder: Decompressed by Foley catheter. IMPRESSION: Mild left hydronephrosis, improved from CT 3 days ago. Electronically Signed   By: Monte Fantasia M.D.   On: 10/14/2016 09:05   Dg Chest Port 1 View  Result Date: 10/15/2016 CLINICAL DATA:  Followup acute respiratory failure. EXAM: PORTABLE CHEST 1 VIEW COMPARISON:  10/14/2016 FINDINGS: Widespread bilateral pneumonia persists, with slight radiographic improvement. Small amount of a fusion and focal volume loss in the right lower lobe. No worsening or new findings. IMPRESSION: Radiographic improvement of diffuse pneumonia. Some persistent volume loss at the right base. Electronically Signed   By: Nelson Chimes M.D.   On: 10/15/2016 07:49   Dg Chest Port 1 View  Result Date: 10/14/2016 CLINICAL DATA:  66 y/o  F; respiratory distress. EXAM: PORTABLE CHEST 1 VIEW COMPARISON:  10/13/2016 chest radiograph FINDINGS: Asymmetric consolidations of the lungs are stable from prior chest radiographs. Small to moderate right pleural effusion. Stable cardiac silhouette within normal limits. Aortic atherosclerosis with calcification. No acute osseous abnormality. IMPRESSION: Stable asymmetric consolidations throughout the lungs and small to moderate right effusion. Differential includes multifocal pneumonia and pulmonary edema. Electronically Signed   By: Kristine Garbe M.D.   On: 10/14/2016 01:30   Dg Chest Port 1 View  Result Date: 10/13/2016 CLINICAL DATA:  Shortness of breath today EXAM: PORTABLE CHEST 1 VIEW COMPARISON:  Earlier today FINDINGS: Continued progression of bilateral airspace disease, asymmetric to the left. Normal heart size for technique. Negative mediastinal contours. Small if any pleural effusions. No pneumothorax. IMPRESSION: Progression of asymmetric bilateral airspace disease favoring pneumonia or noncardiogenic edema (was there aspiration at time of cardiac arrest?). Electronically Signed   By: Monte Fantasia M.D.   On: 10/13/2016 13:44   Dg Abd Portable 1v  Result Date: 10/14/2016 CLINICAL DATA:  Ureteral calculus, abdominal pain EXAM: PORTABLE ABDOMEN - 1 VIEW COMPARISON:  CT abdomen and pelvis 10/11/2016 FINDINGS: LEFT pelvic phleboliths again identified. Distal LEFT ureteral calculus seen on prior CT exam is not visualized on current study. RIGHT femoral line noted. Bowel gas pattern normal. Diffuse osseous demineralization. IMPRESSION: Previously identified distal LEFT ureteral calculus is no longer seen. Electronically Signed   By: Lavonia Dana M.D.   On: 10/14/2016 07:40        Scheduled Meds: . ALPRAZolam  0.25 mg Oral Daily  . aspirin EC  81 mg Oral Daily  . atorvastatin  80 mg Oral q1800  . benzonatate  100 mg Oral TID  . ciprofloxacin  500 mg Oral BID  . insulin aspart  0-9 Units Subcutaneous TID WC  . insulin aspart  3 Units Subcutaneous TID WC  . insulin glargine  18 Units Subcutaneous Daily  . ipratropium-albuterol  3 mL Nebulization Q6H  . mouth rinse  15 mL Mouth Rinse BID   Continuous Infusions: . heparin 1,400 Units/hr (10/15/16 0449)     LOS: 2 days        Aline August, MD [facility] Pager [phone]  If 7PM-7AM, please contact night-coverage [email or web address] Password [facility] 10/15/2016, 9:51 AM

## 2016-10-15 NOTE — Progress Notes (Signed)
CT surgery p.m. Rounds  Patient with continued coarse breath sounds wheezing and very poor tolerance to ambulation ABG shows hypercarbia at rest [PCO2 -  53] Her pre-CABG spirometry show very poor mechanics-functional vital capacity 900cc  and FEV1 only 7 90 cc If pulmonary status does not significantly improve her coronary disease would be best treated with PCI. We'll follow.

## 2016-10-15 NOTE — Progress Notes (Signed)
Patient awaken with anxiety. Oxygen saturations dropped to the 80s. Heart Rate increased to 130s. Patient placed on nonrebreather and repositioned.   5929: Patient tolerating nonrebreather and states, "I feel better."

## 2016-10-15 NOTE — Progress Notes (Signed)
Progress Note  Patient Name: Lisa [name] Date of Encounter: 10/15/2016  Primary Cardiologist: Dr. Saunders Revel  Patient Profile     65 y.o. female with h/o type I DM with insulin pump who presented to [facility] on 9/11 with flank pain and was found to have nephrolithiasis with possible UTI. Immediately after receiving IV Rocephin in the ED, patient started with seizure-like activity and suffered Vfib arrest. Cardiology urgently consulted at [facility]. Patient intubated and underwent cath showing severe 2 vessel CAD with 95% prox to mid LCx and 90-99% sequential mid RCA stehosis.  She is now transferred to [facility] for CVTS consult for CABG.Course Cx also by respiratory failure Seen by TCTS with CABG anticipated next week   Subjective   Without chest pain  Still sob  Inpatient Medications    Scheduled Meds: . ALPRAZolam  0.25 mg Oral Daily  . aspirin EC  81 mg Oral Daily  . atorvastatin  80 mg Oral q1800  . benzonatate  100 mg Oral TID  . ciprofloxacin  500 mg Oral BID  . furosemide  20 mg Intravenous Daily  . insulin aspart  0-9 Units Subcutaneous TID WC  . insulin aspart  3 Units Subcutaneous TID WC  . insulin glargine  18 Units Subcutaneous Daily  . ipratropium-albuterol  3 mL Nebulization Q6H  . mouth rinse  15 mL Mouth Rinse BID   Continuous Infusions: . heparin 1,400 Units/hr (10/15/16 0449)   PRN Meds: acetaminophen, ALPRAZolam, morphine injection, nitroGLYCERIN, ondansetron (ZOFRAN) IV   Vital Signs    Vitals:   10/15/16 0600 10/15/16 0625 10/15/16 0754 10/15/16 0816  BP: (!) 169/82 (!) 143/70    Pulse: (!) 112 (!) 105    Resp: (!) 37 (!) 23    Temp:    98 F (36.7 C)  TempSrc:    Oral  SpO2: (!) 84% 100% 100%   Weight: 137 lb 12.6 oz (62.5 kg)     Height:        Intake/Output Summary (Last 24 hours) at 10/15/16 0901 Last data filed at 10/15/16 0600  Gross per 24 hour  Intake           918.49 ml  Output             2775 ml  Net         -1856.51 ml   Filed Weights   10/13/16 1100 10/14/16 0500 10/15/16 0600  Weight: 148 lb 13 oz (67.5 kg) 140 lb 14 oz (63.9 kg) 137 lb 12.6 oz (62.5 kg)    Telemetry    Personally reviewed  sinus  CXR   Personally Reviewed bilateral infiltrates  Physical Exam  Well developed and nourished inmild respdistress HENT normal Neck supple   Clear diffuse ronchi rales and inspr stridor Regular rate and rhythm, no murmurs or gallops Abd-soft with active BS without hepatomegaly No Clubbing cyanosis tr edema Skin-warm and dry A & Oriented  Grossly normal sensory and motor function   Labs    Chemistry Recent Labs Lab 10/11/16 0711  10/13/16 0452  10/14/16 0442 10/14/16 1347 10/15/16 0234  NA 134*  < > 125*  < > 121* 124* 122*  K 4.6  < > 3.7  < > 3.0* 3.4* 3.8  CL 98*  < > 96*  < > 86* 90* 88*  CO2 27  < > 21*  < > 22 25 27   GLUCOSE 222*  < > 252*  < > 245* 185* 196*  BUN 13  < >  20  < > 12 10 11   CREATININE 0.88  < > 0.80  < > 0.84 0.59 0.61  CALCIUM 9.0  < > 6.6*  < > 6.6* 6.8* 6.9*  PROT 7.5  --  5.3*  --  5.6*  --   --   ALBUMIN 3.8  --  2.6*  --  2.6*  --  2.3*  AST 22  --  133*  --  87*  --   --   ALT 21  --  64*  --  55*  --   --   ALKPHOS 80  --  71  --  75  --   --   BILITOT 0.6  --  0.9  --  1.3*  --   --   GFRNONAA >60  < > >60  < > >60 >60 >60  GFRAA >60  < > >60  < > >60 >60 >60  ANIONGAP 9  < > 8  < > 13 9 7   < > = values in this interval not displayed.   Hematology  Recent Labs Lab 10/13/16 0452 10/14/16 0442 10/15/16 0234  WBC 28.3* 27.2* 26.4*  RBC 3.78* 3.69* 3.32*  HGB 10.7* 9.9* 8.9*  HCT 31.4* 29.4* 26.5*  MCV 83.1 79.7 79.8  MCH 28.2 26.8 26.8  MCHC 34.0 33.7 33.6  RDW 13.9 13.5 13.4  PLT 241 216 211    Cardiac Enzymes  Recent Labs Lab 10/11/16 1237 10/11/16 1651 10/11/16 2306 10/12/16 0455  TROPONINI 7.82* 7.77* 5.68* 3.19*   No results for input(s): TROPIPOC in the last 168 hours.   BNP  Recent Labs Lab 10/13/16 1217 10/14/16 0443 10/15/16 0234    BNP 217.2* 129.5* 92.9     DDimer No results for input(s): DDIMER in the last 168 hours.   Radiology    US Renal  Result Date: 10/14/2016 CLINICAL DATA:  Kidney stone.  History of cardiac arrest. EXAM: RENAL / URINARY TRACT ULTRASOUND COMPLETE COMPARISON:  Abdominal CT 10/11/2016 FINDINGS: Right Kidney: Length: 11.5 cm. 2.2 cm hilar cyst. No hydronephrosis. Borderline hyperechoic renal cortex. Left Kidney: Length: 12.5 cm. Mild hydronephrosis, improved from CT 3 days ago. No evidence of mass. Bladder: Decompressed by Foley catheter. IMPRESSION: Mild left hydronephrosis, improved from CT 3 days ago. Electronically Signed   By: Monte Fantasia M.D.   On: 10/14/2016 09:05   Dg Chest Port 1 View  Result Date: 10/15/2016 CLINICAL DATA:  Followup acute respiratory failure. EXAM: PORTABLE CHEST 1 VIEW COMPARISON:  10/14/2016 FINDINGS: Widespread bilateral pneumonia persists, with slight radiographic improvement. Small amount of a fusion and focal volume loss in the right lower lobe. No worsening or new findings. IMPRESSION: Radiographic improvement of diffuse pneumonia. Some persistent volume loss at the right base. Electronically Signed   By: Nelson Chimes M.D.   On: 10/15/2016 07:49   Dg Chest Port 1 View  Result Date: 10/14/2016 CLINICAL DATA:  66 y/o  F; respiratory distress. EXAM: PORTABLE CHEST 1 VIEW COMPARISON:  10/13/2016 chest radiograph FINDINGS: Asymmetric consolidations of the lungs are stable from prior chest radiographs. Small to moderate right pleural effusion. Stable cardiac silhouette within normal limits. Aortic atherosclerosis with calcification. No acute osseous abnormality. IMPRESSION: Stable asymmetric consolidations throughout the lungs and small to moderate right effusion. Differential includes multifocal pneumonia and pulmonary edema. Electronically Signed   By: Kristine Garbe M.D.   On: 10/14/2016 01:30   Dg Chest Port 1 View  Result Date: 10/13/2016 CLINICAL DATA:   Shortness  of breath today EXAM: PORTABLE CHEST 1 VIEW COMPARISON:  Earlier today FINDINGS: Continued progression of bilateral airspace disease, asymmetric to the left. Normal heart size for technique. Negative mediastinal contours. Small if any pleural effusions. No pneumothorax. IMPRESSION: Progression of asymmetric bilateral airspace disease favoring pneumonia or noncardiogenic edema (was there aspiration at time of cardiac arrest?). Electronically Signed   By: Monte Fantasia M.D.   On: 10/13/2016 13:44   Dg Abd Portable 1v  Result Date: 10/14/2016 CLINICAL DATA:  Ureteral calculus, abdominal pain EXAM: PORTABLE ABDOMEN - 1 VIEW COMPARISON:  CT abdomen and pelvis 10/11/2016 FINDINGS: LEFT pelvic phleboliths again identified. Distal LEFT ureteral calculus seen on prior CT exam is not visualized on current study. RIGHT femoral line noted. Bowel gas pattern normal. Diffuse osseous demineralization. IMPRESSION: Previously identified distal LEFT ureteral calculus is no longer seen. Electronically Signed   By: Lavonia Dana M.D.   On: 10/14/2016 07:40    Cardiac Studies  Conclusion  Cardiac Cath 10/11/2016 Conclusions: 1. Severe 2 vessel coronary artery disease, including heavily calcified 95% proximal/mid LCx and sequential 90-99% mid RCA stenoses. 2. Mild to moderate, nonobstructive disease involving the LMCA and LAD. 3. Overall preserved LV contraction with basal inferior hypokinesis. 4. Upper normal to mildly elevated left ventricular filling pressure (LVEDP 15-20 mmHg). 5. Peripheral vascular disease with ectasia and calcification of the infrarenal abdominal aorta and iliac arteries.  Recommendations: 1. I suspect patient's cardiac arrest was due to demand ischemia in the setting of anaphylactic shock from allergic reaction to ceftriaxone. She has TIMI-3 flow in all major epicardial coronary arteries. I recommend medical therapy and treatment of the underlying infection. If she has a meaningful  neurologic recovery, atherectomy/PCI to the RCA and LCx will need to be considered. ADDENDUM: Images reviewd on 10/12/16 at interventional conference. Given history of DM, severe calcification of lesions, and eccentricity of mid RCA stenosis, cardiac surgery consultation for CABG should be obtained. If Ms. Edmundson is deemed a poor surgical candidate, high-risk PCI with atherectomy could be considered. 2. Obtain transthoracic echocardiogram to confirm LVEF and evaluate for other structural abnormalities. 3. I would like to avoid intra-aortic balloon pump placement if possible, given aortic disease. If hypotension becomes a problem, judicious vasopressor support is recommended. 4. Low-dose aspirin and high intensity statin therapy. Consider heparin infusion (ACS nomogram) if no invasive procedures are necessary (i.e. percutaneous nephrostomy tube). 5. Consider addition of low-dose beta blocker if heart rate and blood pressure allow. If significant ventricular ectopy is observed, amiodarone infusion should be considered.   2D echo 10/12/2016 Study Conclusions  - Left ventricle: The cavity size was normal. Systolic function was   normal. The estimated ejection fraction was in the range of 55%   to 60%. Wall motion was grossly normal; Unable to exclude mild   hypokinesis of the inferior wall. Left ventricular diastolic   function parameters were normal. - Left atrium: The atrium was normal in size. - Right ventricle: Systolic function was normal. - Pulmonary arteries: Systolic pressure was within the normal   range.      Assessment & Plan    1.  Cardiac arrest secondary to Vfib after receiving IV rocephin for UTI with nephrolithiasis.  Received 6 aps epi, 1 amp atropine, intubated and IV solumedrol given.  Shocked for Vib x 4 and loaded with IV Amio as well as IV Mag.  Subsequently had bradycardia with CHB followed by PEA arrest and code STEMI called.  CPR restarted.  Trancutaneous paced and taken  to cath lab for cath.  Arrest felt secondary to demand ischemia in the setting of anaphylactic shock from Ceftriaxone in the setting of obstructive CAD.  She was cooled and placed on pressors.  She is now extubated.  ASCAD - severe 2 vessel with heavily calcified 95% prox to mid LCX and 90-99% sequential mid RCA stenosis and mild to moderate nonobstructive disease of the LM and LAD with overall preserved LVF and basal inferior HK.      Hyponatremia  Worsening    UTI with nephrolithiasis - treatment per Urology - repeating renal US and KUB to see if stone has passed - continue on Cipro for Klebsiella/E Coli   Hypokalemia repleted    Hypocalcemia - check ionized calcium  Pending   Shock liver  Transaminases normalizing     Without symptoms of ischemia  Arrhtyhmia issue is secondary and will not require further attention  Will need CCM help to sort out hyponatremia  Will stop lasix until seen   Will send osm and U na    For questions or updates, please contact [facility] Please consult [email or web address] for contact info under Cardiology/STEMI.      Signed, Virl Axe, MD  10/15/2016, 9:01 AM

## 2016-10-16 ENCOUNTER — Inpatient Hospital Stay (HOSPITAL_COMMUNITY): Payer: Managed Care, Other (non HMO)

## 2016-10-16 LAB — BASIC METABOLIC PANEL
Anion gap: 11 (ref 5–15)
BUN: 12 mg/dL (ref 6–20)
CALCIUM: 7.5 mg/dL — AB (ref 8.9–10.3)
CO2: 26 mmol/L (ref 22–32)
CREATININE: 0.51 mg/dL (ref 0.44–1.00)
Chloride: 89 mmol/L — ABNORMAL LOW (ref 101–111)
Glucose, Bld: 286 mg/dL — ABNORMAL HIGH (ref 65–99)
Potassium: 4.3 mmol/L (ref 3.5–5.1)
SODIUM: 126 mmol/L — AB (ref 135–145)

## 2016-10-16 LAB — GLUCOSE, CAPILLARY
GLUCOSE-CAPILLARY: 341 mg/dL — AB (ref 65–99)
GLUCOSE-CAPILLARY: 279 mg/dL — AB (ref 65–99)
Glucose-Capillary: 340 mg/dL — ABNORMAL HIGH (ref 65–99)
Glucose-Capillary: 353 mg/dL — ABNORMAL HIGH (ref 65–99)

## 2016-10-16 LAB — CBC
HEMATOCRIT: 24.1 % — AB (ref 36.0–46.0)
HEMATOCRIT: 22.9 % — AB (ref 36.0–46.0)
Hemoglobin: 8 g/dL — ABNORMAL LOW (ref 12.0–15.0)
Hemoglobin: 7.7 g/dL — ABNORMAL LOW (ref 12.0–15.0)
MCH: 26.7 pg (ref 26.0–34.0)
MCH: 27.2 pg (ref 26.0–34.0)
MCHC: 33.2 g/dL (ref 30.0–36.0)
MCHC: 33.6 g/dL (ref 30.0–36.0)
MCV: 80.3 fL (ref 78.0–100.0)
MCV: 80.9 fL (ref 78.0–100.0)
PLATELETS: 252 10*3/uL (ref 150–400)
Platelets: 222 10*3/uL (ref 150–400)
RBC: 3 MIL/uL — ABNORMAL LOW (ref 3.87–5.11)
RBC: 2.83 MIL/uL — ABNORMAL LOW (ref 3.87–5.11)
RDW: 13.7 % (ref 11.5–15.5)
RDW: 13.6 % (ref 11.5–15.5)
WBC: 21.6 10*3/uL — AB (ref 4.0–10.5)
WBC: 31.1 10*3/uL — AB (ref 4.0–10.5)

## 2016-10-16 LAB — ABO/RH: ABO/RH(D): A POS

## 2016-10-16 LAB — HEPARIN LEVEL (UNFRACTIONATED): HEPARIN UNFRACTIONATED: 0.5 [IU]/mL (ref 0.30–0.70)

## 2016-10-16 MED ORDER — OXYCODONE-ACETAMINOPHEN 5-325 MG PO TABS
1.0000 | ORAL_TABLET | Freq: Four times a day (QID) | ORAL | Status: DC | PRN
  Administered 2016-10-17 – 2016-10-21 (×6): 1 via ORAL
  Filled 2016-10-16 (×6): qty 1

## 2016-10-16 MED ORDER — HEPARIN SODIUM (PORCINE) 5000 UNIT/ML IJ SOLN: 5000.0000 [IU] | Freq: Three times a day (TID) | INTRAMUSCULAR | Status: DC

## 2016-10-16 MED ORDER — INSULIN ASPART 100 UNIT/ML ~~LOC~~ SOLN
0.0000 [IU] | Freq: Three times a day (TID) | SUBCUTANEOUS | Status: DC
  Administered 2016-10-16: 15 [IU] via SUBCUTANEOUS
  Administered 2016-10-16 (×2): 11 [IU] via SUBCUTANEOUS
  Administered 2016-10-17: 8 [IU] via SUBCUTANEOUS

## 2016-10-16 MED ORDER — HEPARIN SODIUM (PORCINE) 5000 UNIT/ML IJ SOLN: 5000.0000 [IU] | Freq: Two times a day (BID) | INTRAMUSCULAR | Status: DC

## 2016-10-16 MED ORDER — INSULIN ASPART 100 UNIT/ML ~~LOC~~ SOLN
6.0000 [IU] | Freq: Three times a day (TID) | SUBCUTANEOUS | Status: DC
  Administered 2016-10-16 – 2016-10-21 (×12): 6 [IU] via SUBCUTANEOUS

## 2016-10-16 MED ORDER — PREDNISONE 20 MG PO TABS
20.0000 mg | ORAL_TABLET | Freq: Every day | ORAL | Status: DC
  Administered 2016-10-17 – 2016-10-18 (×2): 20 mg via ORAL
  Filled 2016-10-16 (×2): qty 1

## 2016-10-16 MED ORDER — INSULIN GLARGINE 100 UNIT/ML ~~LOC~~ SOLN
22.0000 [IU] | Freq: Every day | SUBCUTANEOUS | Status: DC
  Administered 2016-10-16 – 2016-10-17 (×2): 22 [IU] via SUBCUTANEOUS
  Filled 2016-10-16 (×2): qty 0.22

## 2016-10-16 NOTE — Progress Notes (Signed)
Talked to CCM team regarding this morning Lab results including (Na 126- H&H 8.0/24.1-  Ca 7.5 - Chl 89), no further order. Urine output 2 liters up to now.

## 2016-10-16 NOTE — Progress Notes (Signed)
[facility]  Physician Requesting Consult:  Fransico Him, M.D. / Cardiology  Date of Consult:  10/14/2016  Reason for Consult/Chief Complaint:  Acute Hypoxic Respiratory Failure  History of Presenting Illness:  66 y.o. female, smoker, who presented to outside hospital on 9/11 with left flank pain. Imaging revealed a left ureteral stone as well as hydronephrosis. Rocephin was administered and very shortly after the patient developed a metallic taste and suffered cardiac arrest. Patient underwent resuscitation for approximately 45 minutes.er cardiology documentation the patient required multiple shocks with ventricular fibrillation as the underlying rhythm problem. Patient reportedly was in complete heart block with a wide complex rhythm that prompted patient to undergo emergent left heart catheterization. She was subsequently found to have two-vessel coronary artery disease. Initially patient was on transcutaneous pacing. Patient was placed on normothermia protocol. Patient was subsequently extubated on 9/12 and per the electronic medical record began having increased shortness of breath with desaturation and crackles around 3 AM on 9/13. Lasix was ordered. Oxygen requirement increased from 2 L/m to 5 L/m to maintain saturation greater than 90%. Patient had significant diuresis with that dose of Lasix. Notably the patient also developed worsening hyponatremia as well. Patient's daughter reports that her degree of facial edema and hand swelling have dramatically improved after receiving IV Lasix. Patient denies any subjective fever, chills, or sweats. She continues to have significant chest wall pain from her previous CPR. Additionally, she has an intermittent, nonproductive cough. Reports her dyspnea has slowly worsened since her extubation on 9/12 without any perceived acute worsening.    Subjective:  8.1L negative since admit (4L out in last 24 hours).  O2 needs decreased to 6L.  Pt  reports feeling better.  Preparing to walk with PT.   Temp:  [97.7 F (36.5 C)-98.5 F (36.9 C)] 97.8 F (36.6 C) (09/16 0821) Pulse Rate:  [100-122] 104 (09/16 0500) Resp:  [20-36] 20 (09/16 0500) BP: (104-182)/(54-85) 104/63 (09/16 0500) SpO2:  [90 %-100 %] 100 % (09/16 0500)  General:  Well-developed adult female in no acute distress HEENT: MM pink/moist, no JVD PSY: Appropriate/calm Neuro: AAO 4, speech clear, MA E CV: s1s2 rrr, no m/r/g PULM: even/non-labored, lungs bilaterally clear BT:DHRC, non-tender, bsx4 active  Extremities: warm/dry, no edema  Skin: no rashes or lesions   CBC Latest Ref Rng & Units 10/16/2016 10/15/2016 10/14/2016  WBC 4.0 - 10.5 K/uL 21.6(H) 26.4(H) 27.2(H)  Hemoglobin 12.0 - 15.0 g/dL 8.0(L) 8.9(L) 9.9(L)  Hematocrit 36.0 - 46.0 % 24.1(L) 26.5(L) 29.4(L)  Platelets 150 - 400 K/uL 222 211 216   BMP Latest Ref Rng & Units 10/16/2016 10/15/2016 10/14/2016  Glucose 65 - 99 mg/dL 286(H) 196(H) 185(H)  BUN 6 - 20 mg/dL 12 11 10   Creatinine 0.44 - 1.00 mg/dL 0.51 0.61 0.59  Sodium 135 - 145 mmol/L 126(L) 122(L) 124(L)  Potassium 3.5 - 5.1 mmol/L 4.3 3.8 3.4(L)  Chloride 101 - 111 mmol/L 89(L) 88(L) 90(L)  CO2 22 - 32 mmol/L 26 27 25   Calcium 8.9 - 10.3 mg/dL 7.5(L) 6.9(L) 6.8(L)    Hepatic Function Latest Ref Rng & Units 10/15/2016 10/14/2016 10/13/2016  Total Protein 6.5 - 8.1 g/dL - 5.6(L) 5.3(L)  Albumin 3.5 - 5.0 g/dL 2.3(L) 2.6(L) 2.6(L)  AST 15 - 41 U/L - 87(H) 133(H)  ALT 14 - 54 U/L - 55(H) 64(H)  Alk Phosphatase 38 - 126 U/L - 75 71  Total Bilirubin 0.3 - 1.2 mg/dL - 1.3(H) 0.9    IMAGING/STUDIES: CT  RENAL/STONE STUDY 9/11: IMPRESSION: 1. 4 mm distal left ureteral calculus with mild hydroureteronephrosis. 2. Punctate nonobstructing bilateral renal calculi. 3. Small hiatal hernia. 4. Aortic Atherosclerosis (ICD10-I70.0). Mild infrarenal aortic ectasia and suspected short segment aortic dissection. LHC 9/11: 1. Severe 2 vessel coronary  artery disease, including heavily calcified 95% proximal/mid LCx and sequential 90-99% mid RCA stenoses. 2. Mild to moderate, nonobstructive disease involving the LMCA and LAD. 3. Overall preserved LV contraction with basal inferior hypokinesis. 4. Upper normal to mildly elevated left ventricular filling pressure (LVEDP 15-20 mmHg). 5. Peripheral vascular disease with ectasia and calcification of the infrarenal abdominal aorta and iliac arteries. TTE 9/12:  LV normal in size with EF 55-60% & normal regional wall motion grossly but unable to exclude hypokinesis of inferior wall. Normal diastolic function. LA & RA normal in size. RV normal in size and function. Aortic valve poorly visualized but without obvious stenosis or regurgitation. Aortic root normal in size. No mitral stenosis or regurgitation. No pulmonic stenosis. Mild tricuspid regurgitation. No pericardial effusion. RENAL U/S 9/14: Mild left hydronephrosis, improved from CT 3 days ago.  PORT CXR 9/14: Endotracheal tube removed. Patchy bilateral alveolar and interstitial opacities with some suggestion of consolidation on the left. These findings are worse on the left compared with the right. Blunting of right costophrenic angle with meniscus suggestive of pleural effusion. Spirometry 9/14:  FVC 0.95 L (31%) FEV1 0.79 L (34%) FEV1/FVC 0.84,  FEF 25-75 0.86 L (41%)  MICROBIOLOGY: MRSA PCR 9/11:  Negative Urine Culture 9/11:  E coli & Klebisella pneumoniae (both sensitive to Cipro) Blood Cultures x2 9/12 >>>  ANTIBIOTICS: Rocephin 9/11 (possible anaphylaxis after 1 dose) Cipro 9/11 >>>  LINES/TUBES: OETT 9/11 - 9/12 R FEM CVL 9/11 >>> 9/11 Foley >>>  SIGNIFICANT EVENTS: 09/11 - Admit to [facility] after cardiac arrest >> started normothermia protocol & underwent LHC. 09/13 - Increased SOB & hypoxia (2L >> 5L) around 3am>>Lasix given. Transfer from [facility] to [facility]. 09/14 - PCCM consult for hypoxia & possible multi-focal pneumonia. Additional  lasix given overnight.  ASSESSMENT/PLAN:  66 y.o. female admitted with urinary tract infection and left nephrolithiasis with some element of hydronephrosis. Patient suffered ventricular fibrillation cardiac arrest after Rocephin in the emergency department. Neurologically the patient seems to have recovered / intact.  She is currently being evaluated for treatment of her underlying coronary artery disease. Suspect her hypoxia is more related to atelectasis, volume overload, and some element of lung injury from her resuscitation efforts. Given her improving Procalcitonin and leukocytosis as well as her clinical history I'm less suspicious about an evolving healthcare associated pneumonia. Even so, this must be watched closely. Concern her hyponatremia is secondary to low intracellular potassium further depleted by IV Lasix resulting in subsequent electrolyte shifts and hyponatremia.  In addition, she has been hyperglycemic and has resolving AKI.   Acute hypoxic respiratory failure - likely multifactorial in etiology with post arrest atelectasis, underlying undiagnosed COPD and edema vs PNA.   Obstructive Lung Disease with Acute Exacerbation Tobacco Abuse   P: Wean O2 for sats greater than 90% Pulmonary hygiene-IS, mobilize Intermittent chest x-ray Will need ambulatory O2 assessment prior to discharge Continue Tessalon Perles for cough DuoNeb Q6+ PRN albuterol Reduce prednisone to 20 mg daily x3 then stop Nasal saline for comfort Smoking cessation counseling   Bilateral lung opacities - suspect multifactorial in etiology.  P:  improved 9/16, follow intermittent chest x-ray   Hyponatremia & hypokalemia - corrected Na for glucose = 128, likely hyponatremia  from hyperglycemia and resolving AKI P: Trend BMP Monitor UOP Hold further Lasix Repeat phosphorus and magnesium in a.m.  Anemia P: Repeat CBC this afternoon May require [place], NP-C  Pulmonary & Critical  Care Pgr: [phone] or if no answer [phone] 10/16/2016, 8:29 AM

## 2016-10-16 NOTE — Progress Notes (Signed)
Able to ambulate from door to chair twice about 24 feet. Activity well tolerated- reposition patient in chair with pillows.

## 2016-10-16 NOTE — Progress Notes (Signed)
Procedure(s) (LRB): CORONARY ARTERY BYPASS GRAFTING (CABG) (N/A) TRANSESOPHAGEAL ECHOCARDIOGRAM (TEE) (N/A) Subjective: Acute lung injury better but I am concerned about COPD and very poor mechanics Able to walk a few feet Now anemic from possible groin bleed Low Na slightly better Objective: Vital signs in last 24 hours: Temp:  [97.7 F (36.5 C)-98.5 F (36.9 C)] 97.8 F (36.6 C) (09/16 0821) Pulse Rate:  [100-122] 109 (09/16 0800) Cardiac Rhythm: Sinus tachycardia (09/16 0830) Resp:  [20-36] 24 (09/16 0800) BP: (104-182)/(54-85) 104/65 (09/16 0800) SpO2:  [90 %-100 %] 97 % (09/16 0908)  Hemodynamic parameters for last 24 hours:    Intake/Output from previous day: 09/15 0701 - 09/16 0700 In: [place] [P.O.:1197; I.V.:322; IV Piggyback:250] Out: 9449 [Urine:4050] Intake/Output this shift: Total I/O In: 240 [P.O.:240] Out: 600 [Urine:600]  Lungs much clearer Sinus tach Lab Results:  Recent Labs  10/15/16 0234 10/16/16 0219  WBC 26.4* 21.6*  HGB 8.9* 8.0*  HCT 26.5* 24.1*  PLT 211 222   BMET:  Recent Labs  10/15/16 0234 10/16/16 0219  NA 122* 126*  K 3.8 4.3  CL 88* 89*  CO2 27 26  GLUCOSE 196* 286*  BUN 11 12  CREATININE 0.61 0.51  CALCIUM 6.9* 7.5*    PT/INR:  Recent Labs  10/15/16 0234  LABPROT 13.8  INR 1.07   ABG    Component Value Date/Time   PHART 7.349 (L) 10/15/2016 0345   HCO3 28.1 (H) 10/15/2016 0345   ACIDBASEDEF 12.1 (H) 10/11/2016 1530   O2SAT 98.1 10/15/2016 0345   CBG (last 3)   Recent Labs  10/15/16 1559 10/15/16 2103 10/16/16 0817  GLUCAP 275* 281* 341*    Assessment/Plan: S/P Procedure(s) (LRB): CORONARY ARTERY BYPASS GRAFTING (CABG) (N/A) TRANSESOPHAGEAL ECHOCARDIOGRAM (TEE) (N/A) Urosepsis leukocytosis,, v-fib arrest, acute resp failure with longstanding COPD Anemia, deconditioning, hyponatremia Will repeat PFTs next week Not currently surgical candidate for her CAD  LOS: 3 days    Tharon Aquas Trigt  III 10/16/2016

## 2016-10-16 NOTE — Progress Notes (Signed)
Patient off floor to CT scan

## 2016-10-16 NOTE — Progress Notes (Signed)
Cardiology notified about patient's CBC results and CT scan results.

## 2016-10-16 NOTE — Progress Notes (Signed)
ANTICOAGULATION CONSULT NOTE - Follow Up Consult  Pharmacy Consult for Heparin Indication: severe multivessel CAD  Allergies  Allergen Reactions  . Ceftriaxone     Anaphylaxis, cardiac arrest    Patient Measurements: Height: 5\' 3"  (160 cm) Weight: 137 lb 12.6 oz (62.5 kg) IBW/kg (Calculated) : 52.4  Vital Signs: Temp: 97.8 F (36.6 C) (09/16 0821) Temp Source: Oral (09/16 0821) BP: 104/63 (09/16 0500) Pulse Rate: 104 (09/16 0500)  Labs:  Recent Labs  10/14/16 0442 10/14/16 1347  10/15/16 0234 10/15/16 1132 10/16/16 0219  HGB 9.9*  --   --  8.9*  --  8.0*  HCT 29.4*  --   --  26.5*  --  24.1*  PLT 216  --   --  211  --  222  LABPROT 13.9  --   --  13.8  --   --   INR 1.08  --   --  1.07  --   --   HEPARINUNFRC 0.21* 0.16*  < > 0.26* 0.36 0.50  CREATININE 0.84 0.59  --  0.61  --  0.51  < > = values in this interval not displayed.  Estimated Creatinine Clearance: 58.8 mL/min (by C-G formula based on SCr of 0.51 mg/dL).   Medications:  . heparin 1,400 Units/hr (10/15/16 0449)     Assessment: Lisa [name] s/p cardiac arrest continues on heparin for severe multivessel CAD awaiting CABG next week. Heparin level is therapeutic at 0.5. Hgb continues to drift down, Pltc stable.  No overt bleeding or complications noted.  Goal of Therapy:  Heparin level 0.3-0.7 units/ml Monitor platelets by anticoagulation protocol: Yes   Plan:  1) Continue heparin at 1400 units/hr 2) Follow up daily heparin level and CBC 3) F/u plans for CABG next week.  Uvaldo Rising, BCPS  Clinical Pharmacist Pager [phone]  10/16/2016 8:26 AM

## 2016-10-16 NOTE — Evaluation (Signed)
Physical Therapy Evaluation Patient Details Name: Lisa [name] MRN: [id number] DOB: [birth year] Today's Date: 10/16/2016   History of Present Illness  66 y.o. female admitted with urinary tract infection and left nephrolithiasis with some element of hydronephrosis. Patient suffered ventricular fibrillation cardiac arrest after Rocephin in the emergency department. Neurologically the patient seems to have recovered / intact.  She is currently being evaluated for treatment of her underlying coronary artery disease. Suspect her hypoxia is more related to atelectasis, volume overload, and some element of lung injury from her resuscitation efforts.Likely for CABG next week  Clinical Impression  Pt admitted with above diagnosis. Pt currently with functional limitations due to the deficits listed below (see PT Problem List). Completely independent prior to admission; Presents now with generalized weakness, decr fucntional capacity; MDs are considering further Cardiac/surgical intervention, likely CABG next week; I very much recommend post-acute rehab to maximize independence and safety with mobility prior to going home; Further, I favor CIR for post-acute rehab; will continue to follow, and watch closely for progress, ex\specially after further cardiac intervention; Pt will benefit from skilled PT to increase their independence and safety with mobility to allow discharge to the venue listed below.       Follow Up Recommendations CIR;Other (comment) (post-acute rehab)    Equipment Recommendations  Rolling walker with 5" wheels;3in1 (PT)    Recommendations for Other Services OT consult (order placed per protocol)     Precautions / Restrictions Precautions Precautions: Fall Restrictions Weight Bearing Restrictions: No      Mobility  Bed Mobility                  Transfers Overall transfer level: Needs assistance Equipment used: Rolling walker (2 wheeled) Transfers: Sit to/from  Stand Sit to Stand: Min assist;+2 safety/equipment         General transfer comment: Cues for hand placement and safety  Ambulation/Gait Ambulation/Gait assistance: Min assist;+2 safety/equipment Ambulation Distance (Feet): 10 Feet (5x2, seated rest break in between) Assistive device: Rolling walker (2 wheeled) Gait Pattern/deviations: Step-to pattern;Decreased step length - right;Decreased step length - left;Decreased stride length     General Gait Details: Cues to for deep breathing and to self-monitor for activity tolerance; very weak and required on seated rest break  Stairs            Wheelchair Mobility    Modified Rankin (Stroke Patients Only)       Balance                                             Pertinent Vitals/Pain Pain Assessment: Faces Faces Pain Scale: Hurts even more Pain Location: Chest wall pain, especially with coughing Pain Descriptors / Indicators: Aching Pain Intervention(s): Monitored during session    Home Living Family/patient expects to be discharged to:: Private residence Living Arrangements: Spouse/significant other Available Help at Discharge: Family;Available 24 hours/day (this must be verified) Type of Home: House Home Access: Stairs to enter Entrance Stairs-Rails: None Entrance Stairs-Number of Steps: 3 Home Layout: One level Home Equipment: None      Prior Function Level of Independence: Independent         Comments: 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     Hand Dominance        Extremity/Trunk Assessment   Upper Extremity Assessment Upper Extremity Assessment: Defer to  OT evaluation;Generalized weakness    Lower Extremity Assessment Lower Extremity Assessment: Generalized weakness       Communication      Cognition Arousal/Alertness: Awake/alert Behavior During Therapy: WFL for tasks assessed/performed;Flat affect Overall Cognitive Status: Within  Functional Limits for tasks assessed                                        General Comments General comments (skin integrity, edema, etc.): session conducted on 6 L O2 via high flow Violet; O2 sats ranged from 89%-95%; Near constant cues for deep controlled breathing    Exercises     Assessment/Plan    PT Assessment Patient needs continued PT services  PT Problem List Decreased strength;Decreased activity tolerance;Decreased balance;Decreased mobility;Decreased coordination;Decreased knowledge of use of DME;Decreased knowledge of precautions;Cardiopulmonary status limiting activity;Pain       PT Treatment Interventions DME instruction;Gait training;Stair training;Functional mobility training;Therapeutic activities;Therapeutic exercise;Balance training;Patient/family education    PT Goals (Current goals can be found in the Care Plan section)  Acute Rehab PT Goals Patient Stated Goal: Wants to get better PT Goal Formulation: With patient Time For Goal Achievement: 10/30/16 Potential to Achieve Goals: Good    Frequency Min 3X/week   Barriers to discharge        Co-evaluation               AM-PAC PT "6 Clicks" Daily Activity  Outcome Measure Difficulty turning over in bed (including adjusting bedclothes, sheets and blankets)?: A Lot Difficulty moving from lying on back to sitting on the side of the bed? : A Lot Difficulty sitting down on and standing up from a chair with arms (e.g., wheelchair, bedside commode, etc,.)?: A Lot Help needed moving to and from a bed to chair (including a wheelchair)?: A Lot Help needed walking in hospital room?: A Lot Help needed climbing 3-5 steps with a railing? : A Lot 6 Click Score: 12    End of Session Equipment Utilized During Treatment: Gait belt Activity Tolerance: Patient tolerated treatment well Patient left: in chair;with call bell/phone within reach;with family/visitor present Nurse Communication: Mobility  status PT Visit Diagnosis: Unsteadiness on feet (R26.81);Other abnormalities of gait and mobility (R26.89);Pain Pain - Right/Left:  (center) Pain - part of body:  (Chest)    Time: 9937-1696 PT Time Calculation (min) (ACUTE ONLY): 30 min   Charges:   PT Evaluation $PT Eval Moderate Complexity: 1 Mod PT Treatments $Gait Training: 8-22 mins   PT G Codes:        Roney Marion, PT  Acute Rehabilitation Services Pager [phone] Office [phone]   Colletta Maryland 10/16/2016, 10:55 AM

## 2016-10-16 NOTE — Progress Notes (Addendum)
ANTICOAGULATION CONSULT NOTE - Follow Up Consult  Pharmacy Consult for Heparin Indication: severe multivessel CAD  Allergies  Allergen Reactions  . Ceftriaxone     Anaphylaxis, cardiac arrest    Patient Measurements: Height: 5\' 3"  (160 cm) Weight: 137 lb 12.6 oz (62.5 kg) IBW/kg (Calculated) : 52.4  Vital Signs: Temp: 98 F (36.7 C) (09/16 1159) Temp Source: Oral (09/16 1621) BP: 130/64 (09/16 1600) Pulse Rate: 114 (09/16 1600)  Labs:  Recent Labs  10/14/16 0442 10/14/16 1347  10/15/16 0234 10/15/16 1132 10/16/16 0219 10/16/16 1519  HGB 9.9*  --   --  8.9*  --  8.0* 7.7*  HCT 29.4*  --   --  26.5*  --  24.1* 22.9*  PLT 216  --   --  211  --  222 252  LABPROT 13.9  --   --  13.8  --   --   --   INR 1.08  --   --  1.07  --   --   --   HEPARINUNFRC 0.21* 0.16*  < > 0.26* 0.36 0.50  --   CREATININE 0.84 0.59  --  0.61  --  0.51  --   < > = values in this interval not displayed.  Estimated Creatinine Clearance: 58.8 mL/min (by C-G formula based on SCr of 0.51 mg/dL).   Medications:     Assessment: 65yof s/p cardiac arrest continues on heparin for severe multivessel CAD awaiting CABG next week. Heparin level is therapeutic at 0.5. Hgb continues to drift down, Pltc stable.  No overt bleeding or complications noted.  Goal of Therapy:  Heparin level 0.3-0.7 units/ml Monitor platelets by anticoagulation protocol: Yes   Plan:  1) Continue heparin at 1400 units/hr 2) Follow up daily heparin level and CBC 3) F/u plans for CABG next week.  Uvaldo Rising, BCPS  Clinical Pharmacist Pager [phone]  10/16/2016 4:30 PM   ADDENDUM Heparin infusion consult discontinued. Pharmacy consulted to change to DVT prophylaxis given suspected hematoma in right hip per CT scan.  H/H drifting downwards to 7.7/22.9 with platelets within normal range. Will start heparin 5000 units subQ every 8 hours starting tomorrow AM.  Doylene Canard, PharmD Clinical Pharmacist   Phone: [phone]

## 2016-10-16 NOTE — Progress Notes (Addendum)
CT of R leg confirms a large thigh hematoma, albeit apparently removed from the cath site. Groin is soft, without apparent bleeding, hematoma or bruit. Measured the thigh circumference at mid thigh level at 20 cm (location marked). Hgb steadily dropping. From high of 13, down to 7.7. No evidence of compartment syndrome on exam. Warm foot, good distal pulses, no neuro complaints. Stop IV heparin - give just DVT prophylaxis SQ dose. Keep monitoring Hgb and transfuse if Hgb <7 or if she develops angina or dyspnea.  Sanda Klein, MD, [facility] [phone] office [phone] pager

## 2016-10-16 NOTE — Progress Notes (Signed)
Progress Note  Patient Name: Lisa [name] Date of Encounter: 10/16/2016  Primary Cardiologist: Dr. Saunders Revel  Patient Profile     66 y.o. female with h/o type I DM with insulin pump who presented to [facility] on 9/11 with flank pain and was found to have nephrolithiasis with possible UTI. Immediately after receiving IV Rocephin in the ED, patient started with seizure-like activity and suffered Vfib arrest. Cardiology urgently consulted at [facility]. Patient intubated and underwent cath showing severe 2 vessel CAD with 95% prox to mid LCx and 90-99% sequential mid RCA stehosis.  She is now transferred to [facility] for CVTS consult for CABG.Course Cx also by respiratory failure Seen by TCTS with CABG anticipated next week  Hyponatremia COPD>>flare   Subjective      Inpatient Medications    Scheduled Meds: . ALPRAZolam  0.25 mg Oral Daily  . aspirin EC  81 mg Oral Daily  . atorvastatin  80 mg Oral q1800  . benzonatate  100 mg Oral TID  . ciprofloxacin  500 mg Oral BID  . feeding supplement (GLUCERNA SHAKE)  237 mL Oral TID WC  . guaiFENesin  600 mg Oral BID  . insulin aspart  0-15 Units Subcutaneous TID WC  . insulin aspart  6 Units Subcutaneous TID WC  . insulin glargine  22 Units Subcutaneous Daily  . ipratropium-albuterol  3 mL Nebulization Q6H  . mouth rinse  15 mL Mouth Rinse BID  . predniSONE  30 mg Oral Q breakfast   Continuous Infusions: . heparin 1,400 Units/hr (10/15/16 0449)   PRN Meds: acetaminophen, ALPRAZolam, morphine injection, nitroGLYCERIN, ondansetron (ZOFRAN) IV, sodium chloride   Vital Signs    Vitals:   10/16/16 0700 10/16/16 0800 10/16/16 0821 10/16/16 0908  BP: 114/67 104/65    Pulse: (!) 106 (!) 109    Resp: (!) 22 (!) 24    Temp:   97.8 F (36.6 C)   TempSrc:   Oral   SpO2: 100% 100%  97%  Weight:      Height:        Intake/Output Summary (Last 24 hours) at 10/16/16 0952 Last data filed at 10/16/16 0600  Gross per 24 hour  Intake             1381 ml    Output             4050 ml  Net            -2669 ml   Filed Weights   10/13/16 1100 10/14/16 0500 10/15/16 0600  Weight: 148 lb 13 oz (67.5 kg) 140 lb 14 oz (63.9 kg) 137 lb 12.6 oz (62.5 kg)    Telemetry    Personally reviewed sinus tach  CXR   Personally Reviewed bilateral infiltrates but improving   Physical Exam  Well developed and nourished in no acute distress HENT normal Neck supple with JVP-flat Clear much improved Regular rate and rhythm, no murmurs or gallops Abd-soft with active BS No Clubbing cyanosis edema  There is palpable fullness of the R thigh ( cath site) Skin-warm and dry A & Oriented  Grossly normal sensory and motor function    Labs    Chemistry Recent Labs Lab 10/11/16 0711  10/13/16 0452  10/14/16 0442 10/14/16 1347 10/15/16 0234 10/16/16 0219  NA 134*  < > 125*  < > 121* 124* 122* 126*  K 4.6  < > 3.7  < > 3.0* 3.4* 3.8 4.3  CL 98*  < >  96*  < > 86* 90* 88* 89*  CO2 27  < > 21*  < > 22 25 27 26   GLUCOSE 222*  < > 252*  < > 245* 185* 196* 286*  BUN 13  < > 20  < > 12 10 11 12   CREATININE 0.88  < > 0.80  < > 0.84 0.59 0.61 0.51  CALCIUM 9.0  < > 6.6*  < > 6.6* 6.8* 6.9* 7.5*  PROT 7.5  --  5.3*  --  5.6*  --   --   --   ALBUMIN 3.8  --  2.6*  --  2.6*  --  2.3*  --   AST 22  --  133*  --  87*  --   --   --   ALT 21  --  64*  --  55*  --   --   --   ALKPHOS 80  --  71  --  75  --   --   --   BILITOT 0.6  --  0.9  --  1.3*  --   --   --   GFRNONAA >60  < > >60  < > >60 >60 >60 >60  GFRAA >60  < > >60  < > >60 >60 >60 >60  ANIONGAP 9  < > 8  < > 13 9 7 11   < > = values in this interval not displayed.   Hematology  Recent Labs Lab 10/14/16 0442 10/15/16 0234 10/16/16 0219  WBC 27.2* 26.4* 21.6*  RBC 3.69* 3.32* 3.00*  HGB 9.9* 8.9* 8.0*  HCT 29.4* 26.5* 24.1*  MCV 79.7 79.8 80.3  MCH 26.8 26.8 26.7  MCHC 33.7 33.6 33.2  RDW 13.5 13.4 13.7  PLT 216 211 222    Cardiac Enzymes  Recent Labs Lab 10/11/16 1237  10/11/16 1651 10/11/16 2306 10/12/16 0455  TROPONINI 7.82* 7.77* 5.68* 3.19*   No results for input(s): TROPIPOC in the last 168 hours.   BNP  Recent Labs Lab 10/13/16 1217 10/14/16 0443 10/15/16 0234  BNP 217.2* 129.5* 92.9     DDimer No results for input(s): DDIMER in the last 168 hours.   Radiology    Dg Chest Port 1 View  Result Date: 10/16/2016 CLINICAL DATA:  Shortness of breath. EXAM: PORTABLE CHEST 1 VIEW COMPARISON:  October 15, 2016 FINDINGS: Left greater than right pulmonary opacities persist, improved in the interval. No other interval changes. IMPRESSION: Improving but persistent left greater than right pulmonary opacities. Electronically Signed   By: Dorise Bullion III M.D   On: 10/16/2016 08:40   Dg Chest Port 1 View  Result Date: 10/15/2016 CLINICAL DATA:  Followup acute respiratory failure. EXAM: PORTABLE CHEST 1 VIEW COMPARISON:  10/14/2016 FINDINGS: Widespread bilateral pneumonia persists, with slight radiographic improvement. Small amount of a fusion and focal volume loss in the right lower lobe. No worsening or new findings. IMPRESSION: Radiographic improvement of diffuse pneumonia. Some persistent volume loss at the right base. Electronically Signed   By: Nelson Chimes M.D.   On: 10/15/2016 07:49    Cardiac Studies  Conclusion  Cardiac Cath 10/11/2016 Conclusions: 1. Severe 2 vessel coronary artery disease, including heavily calcified 95% proximal/mid LCx and sequential 90-99% mid RCA stenoses. 2. Mild to moderate, nonobstructive disease involving the LMCA and LAD. 3. Overall preserved LV contraction with basal inferior hypokinesis. 4. Upper normal to mildly elevated left ventricular filling pressure (LVEDP 15-20 mmHg). 5. Peripheral vascular disease with ectasia and  calcification of the infrarenal abdominal aorta and iliac arteries.  Recommendations: 1. I suspect patient's cardiac arrest was due to demand ischemia in the setting of anaphylactic shock  from allergic reaction to ceftriaxone. She has TIMI-3 flow in all major epicardial coronary arteries. I recommend medical therapy and treatment of the underlying infection. If she has a meaningful neurologic recovery, atherectomy/PCI to the RCA and LCx will need to be considered. ADDENDUM: Images reviewd on 10/12/16 at interventional conference. Given history of DM, severe calcification of lesions, and eccentricity of mid RCA stenosis, cardiac surgery consultation for CABG should be obtained. If Ms. Brissett is deemed a poor surgical candidate, high-risk PCI with atherectomy could be considered. 2. Obtain transthoracic echocardiogram to confirm LVEF and evaluate for other structural abnormalities. 3. I would like to avoid intra-aortic balloon pump placement if possible, given aortic disease. If hypotension becomes a problem, judicious vasopressor support is recommended. 4. Low-dose aspirin and high intensity statin therapy. Consider heparin infusion (ACS nomogram) if no invasive procedures are necessary (i.e. percutaneous nephrostomy tube). 5. Consider addition of low-dose beta blocker if heart rate and blood pressure allow. If significant ventricular ectopy is observed, amiodarone infusion should be considered.   2D echo 10/12/2016 Study Conclusions  - Left ventricle: The cavity size was normal. Systolic function was   normal. The estimated ejection fraction was in the range of 55%   to 60%. Wall motion was grossly normal; Unable to exclude mild   hypokinesis of the inferior wall. Left ventricular diastolic   function parameters were normal. - Left atrium: The atrium was normal in size. - Right ventricle: Systolic function was normal. - Pulmonary arteries: Systolic pressure was within the normal   range.      Assessment & Plan    Cardiac arrest secondary to Vfib after receiving IV rocephin for UTI with nephrolithiasis.     Arrest felt secondary to demand ischemia in the setting of anaphylactic  shock from Ceftriaxone in the setting of obstructive CAD.  She was cooled and placed on pressors.   now extubated.  CAD - severe 2 vessel with heavily calcified 95% prox to mid LCX and 90-99% sequential mid RCA stenosis and mild to moderate nonobstructive disease of the LM and LAD with overall preserved LVF and basal inferior HK.  CABG next week  COPD  Improving now on steroids and diuresing well     Hyponatremia  Improving  122>>126   UTI with nephrolithiasis - continue on Cipro for Klebsiella/E Coli   Hypokalemia resolved  Sinus Tachycardia  Not sure what this is about - this is post cath and there is fullness of the R thigh, I am concerned about groin bleed>> CT  Diabetes Mellitus (hgb A1c 7.0)    Hypocalcemia - check ionized calcium  Low  Per CCM    Anemia  Will send T & C-- will recheck Hgb this afternoon and consider transfusion     For questions or updates, please contact [facility] Please consult [email or web address] for contact info under Cardiology/STEMI.      Signed, Virl Axe, MD  10/16/2016, 9:52 AM

## 2016-10-17 ENCOUNTER — Inpatient Hospital Stay (HOSPITAL_COMMUNITY): Payer: Managed Care, Other (non HMO)

## 2016-10-17 LAB — MAGNESIUM: MAGNESIUM: 2.1 mg/dL (ref 1.7–2.4)

## 2016-10-17 LAB — CULTURE, BLOOD (ROUTINE X 2)
CULTURE: NO GROWTH
CULTURE: NO GROWTH

## 2016-10-17 LAB — CBC
HCT: 20.2 % — ABNORMAL LOW (ref 36.0–46.0)
Hemoglobin: 6.8 g/dL — CL (ref 12.0–15.0)
MCH: 27 pg (ref 26.0–34.0)
MCHC: 33.7 g/dL (ref 30.0–36.0)
MCV: 80.2 fL (ref 78.0–100.0)
PLATELETS: 266 10*3/uL (ref 150–400)
RBC: 2.52 MIL/uL — AB (ref 3.87–5.11)
RDW: 13.8 % (ref 11.5–15.5)
WBC: 26.5 10*3/uL — AB (ref 4.0–10.5)

## 2016-10-17 LAB — HEMOGLOBIN AND HEMATOCRIT, BLOOD
HEMATOCRIT: 26 % — AB (ref 36.0–46.0)
Hemoglobin: 8.8 g/dL — ABNORMAL LOW (ref 12.0–15.0)

## 2016-10-17 LAB — PHOSPHORUS: PHOSPHORUS: 2 mg/dL — AB (ref 2.5–4.6)

## 2016-10-17 LAB — GLUCOSE, CAPILLARY
GLUCOSE-CAPILLARY: 290 mg/dL — AB (ref 65–99)
Glucose-Capillary: 385 mg/dL — ABNORMAL HIGH (ref 65–99)
Glucose-Capillary: 419 mg/dL — ABNORMAL HIGH (ref 65–99)
Glucose-Capillary: 340 mg/dL — ABNORMAL HIGH (ref 65–99)
Glucose-Capillary: 138 mg/dL — ABNORMAL HIGH (ref 65–99)

## 2016-10-17 LAB — BASIC METABOLIC PANEL
Anion gap: 7 (ref 5–15)
BUN: 26 mg/dL — AB (ref 6–20)
CALCIUM: 8 mg/dL — AB (ref 8.9–10.3)
CHLORIDE: 89 mmol/L — AB (ref 101–111)
CO2: 28 mmol/L (ref 22–32)
CREATININE: 0.79 mg/dL (ref 0.44–1.00)
Glucose, Bld: 245 mg/dL — ABNORMAL HIGH (ref 65–99)
Potassium: 4.7 mmol/L (ref 3.5–5.1)
SODIUM: 124 mmol/L — AB (ref 135–145)

## 2016-10-17 LAB — PREPARE RBC (CROSSMATCH)

## 2016-10-17 MED ORDER — SODIUM CHLORIDE 0.9 % IV SOLN
Freq: Once | INTRAVENOUS | Status: AC
  Administered 2016-10-17: 250 mL via INTRAVENOUS

## 2016-10-17 MED ORDER — FUROSEMIDE 10 MG/ML IJ SOLN
40.0000 mg | Freq: Once | INTRAMUSCULAR | Status: AC
Start: 2016-10-17 — End: 2016-10-17
  Administered 2016-10-17: 40 mg via INTRAVENOUS
  Filled 2016-10-17: qty 4

## 2016-10-17 MED ORDER — DILTIAZEM HCL 100 MG IV SOLR
5.0000 mg/h | INTRAVENOUS | Status: DC
  Administered 2016-10-17 – 2016-10-18 (×3): 10 mg/h via INTRAVENOUS
  Administered 2016-10-19: 5 mg/h via INTRAVENOUS
  Administered 2016-10-19: 10 mg/h via INTRAVENOUS
  Filled 2016-10-17 (×4): qty 100

## 2016-10-17 MED ORDER — HEPARIN SODIUM (PORCINE) 5000 UNIT/ML IJ SOLN
5000.0000 [IU] | Freq: Three times a day (TID) | INTRAMUSCULAR | Status: DC
  Administered 2016-10-18 – 2016-10-21 (×11): 5000 [IU] via SUBCUTANEOUS
  Filled 2016-10-17 (×11): qty 1

## 2016-10-17 MED ORDER — DILTIAZEM LOAD VIA INFUSION
15.0000 mg | Freq: Once | INTRAVENOUS | Status: AC
  Administered 2016-10-17: 15 mg via INTRAVENOUS
  Filled 2016-10-17: qty 15

## 2016-10-17 MED ORDER — METOPROLOL TARTRATE 5 MG/5ML IV SOLN
INTRAVENOUS | Status: AC
  Filled 2016-10-17: qty 5

## 2016-10-17 MED ORDER — SODIUM PHOSPHATES 45 MMOLE/15ML IV SOLN
20.0000 mmol | Freq: Once | INTRAVENOUS | Status: AC
  Administered 2016-10-17: 20 mmol via INTRAVENOUS
  Filled 2016-10-17: qty 6.67

## 2016-10-17 MED ORDER — INSULIN GLARGINE 100 UNIT/ML ~~LOC~~ SOLN
25.0000 [IU] | Freq: Every day | SUBCUTANEOUS | Status: DC
  Administered 2016-10-18 – 2016-10-21 (×4): 25 [IU] via SUBCUTANEOUS
  Filled 2016-10-17 (×4): qty 0.25

## 2016-10-17 MED ORDER — METOPROLOL TARTRATE 5 MG/5ML IV SOLN
5.0000 mg | INTRAVENOUS | Status: DC | PRN
  Administered 2016-10-17: 5 mg via INTRAVENOUS

## 2016-10-17 MED ORDER — IPRATROPIUM BROMIDE 0.02 % IN SOLN
0.5000 mg | Freq: Four times a day (QID) | RESPIRATORY_TRACT | Status: DC
  Administered 2016-10-17 (×2): 0.5 mg via RESPIRATORY_TRACT
  Filled 2016-10-17 (×2): qty 2.5

## 2016-10-17 MED ORDER — METOPROLOL TARTRATE 25 MG PO TABS: 25.0000 mg | ORAL_TABLET | Freq: Four times a day (QID) | ORAL | Status: DC

## 2016-10-17 MED ORDER — LEVALBUTEROL HCL 0.63 MG/3ML IN NEBU: 0.6300 mg | INHALATION_SOLUTION | RESPIRATORY_TRACT | Status: DC | PRN

## 2016-10-17 MED ORDER — LEVALBUTEROL HCL 1.25 MG/0.5ML IN NEBU
1.2500 mg | INHALATION_SOLUTION | Freq: Four times a day (QID) | RESPIRATORY_TRACT | Status: DC
  Administered 2016-10-17 (×2): 1.25 mg via RESPIRATORY_TRACT
  Filled 2016-10-17 (×2): qty 0.5

## 2016-10-17 MED ORDER — INSULIN ASPART 100 UNIT/ML ~~LOC~~ SOLN
0.0000 [IU] | Freq: Three times a day (TID) | SUBCUTANEOUS | Status: DC
  Administered 2016-10-17: 15 [IU] via SUBCUTANEOUS
  Administered 2016-10-17: 20 [IU] via SUBCUTANEOUS
  Administered 2016-10-18: 15 [IU] via SUBCUTANEOUS
  Administered 2016-10-18: 7 [IU] via SUBCUTANEOUS
  Administered 2016-10-18: 11 [IU] via SUBCUTANEOUS
  Administered 2016-10-19: 20 [IU] via SUBCUTANEOUS
  Administered 2016-10-19 – 2016-10-20 (×2): 4 [IU] via SUBCUTANEOUS
  Administered 2016-10-20: 7 [IU] via SUBCUTANEOUS
  Administered 2016-10-21 (×2): 4 [IU] via SUBCUTANEOUS

## 2016-10-17 NOTE — Progress Notes (Addendum)
   Has had recent wheeze. Will stop new order for Bb and start Diltiazem IV. Was on diltiazem at home.   She just bled into her leg, needed transfusion. Will hold on anticoagulation for now.   Discussed with Marland Mcalpine, nursing team. She discussed as well with CCM team.   Candee Furbish, MD

## 2016-10-17 NOTE — Progress Notes (Signed)
Rehab Admissions Coordinator Note:  Patient was screened by Retta Diones for appropriateness for an Inpatient Acute Rehab Consult.  Noted PT/OT recommending CIR.  At this time, we are recommending Inpatient Rehab consult.  Retta Diones 10/17/2016, 11:55 AM  I can be reached at [phone].

## 2016-10-17 NOTE — Progress Notes (Signed)
CRITICAL VALUE ALERT  Critical Value:HGB 6.8  Date & Time Notied:  10/17/2016 0305  Provider Notified: Dr Wynonia Lawman  Orders Received/Actions taken: Transfuse one unit PRBCs.  Obtain post transfusion HCT

## 2016-10-17 NOTE — Progress Notes (Signed)
OT Cancellation Note  Patient Details Name: Lisa [name] MRN: [id number] DOB: 1950-06-18   Cancelled Treatment:    Reason Eval/Treat Not Completed: Other (comment). Pt with Hgb of 6.8 this AM and getting transfusion, will see pt post transfusion as schedule allows.  Almon Register [phone] 10/17/2016, 7:26 AM

## 2016-10-17 NOTE — Progress Notes (Signed)
   AFIB RVR 150-160  Giving metoprol IV 5mg  and PO 25 QID with hold parameters.   Candee Furbish, MD

## 2016-10-17 NOTE — Progress Notes (Signed)
Procedure(s) (LRB): CORONARY ARTERY BYPASS GRAFTING (CABG) (N/A) TRANSESOPHAGEAL ECHOCARDIOGRAM (TEE) (N/A) Subjective: Pt not progressing well after vfib arrest with pulmonary status not adequate for sternotomy due to prior hx smoking,COPD and recent acute respiratory failure persisitent wheezingdespite bronchodilators and steroids. PFTs and ABG poor  REC PCI of her CAD 2 vessel CAD with LAD ok  Objective: Vital signs in last 24 hours: Temp:  [97.6 F (36.4 C)-98 F (36.7 C)] 97.7 F (36.5 C) (09/17 1157) Pulse Rate:  [94-135] 127 (09/17 1430) Cardiac Rhythm: Normal sinus rhythm (09/17 0800) Resp:  [15-29] 22 (09/17 1430) BP: (100-156)/(56-95) 122/95 (09/17 1430) SpO2:  [86 %-100 %] 95 % (09/17 1430) FiO2 (%):  [24 %] 24 % (09/17 0919) Weight:  [142 lb 3.2 oz (64.5 kg)] 142 lb 3.2 oz (64.5 kg) (09/17 0600)  Hemodynamic parameters for last 24 hours:  afib  Intake/Output from previous day: 09/16 0701 - 09/17 0700 In: 1808 [P.O.:1080; I.V.:56; Blood:336] Out: 2650 [Urine:2650] Intake/Output this shift: Total I/O In: 515.7 [P.O.:240; I.V.:19; IV Piggyback:256.7] Out: 2325 [Urine:2325]    Lab Results:  Recent Labs  10/16/16 1519 10/17/16 0224 10/17/16 0817  WBC 31.1* 26.5*  --   HGB 7.7* 6.8* 8.8*  HCT 22.9* 20.2* 26.0*  PLT 252 266  --    BMET:  Recent Labs  10/16/16 0219 10/17/16 0930  NA 126* 124*  K 4.3 4.7  CL 89* 89*  CO2 26 28  GLUCOSE 286* 245*  BUN 12 26*  CREATININE 0.51 0.79  CALCIUM 7.5* 8.0*    PT/INR:  Recent Labs  10/15/16 0234  LABPROT 13.8  INR 1.07   ABG    Component Value Date/Time   PHART 7.349 (L) 10/15/2016 0345   HCO3 28.1 (H) 10/15/2016 0345   ACIDBASEDEF 12.1 (H) 10/11/2016 1530   O2SAT 98.1 10/15/2016 0345   CBG (last 3)   Recent Labs  10/16/16 2138 10/17/16 0827 10/17/16 1156  GLUCAP 279* 290* 385*    Assessment/Plan: S/P Procedure(s) (LRB): CORONARY ARTERY BYPASS GRAFTING (CABG) (N/A) TRANSESOPHAGEAL  ECHOCARDIOGRAM (TEE) (N/A) S/P urosepsis v-fib arrest 2 vessel CAD Needs PCI of LAD- not candidate for CABG   LOS: 4 days    Tharon Aquas Trigt III 10/17/2016

## 2016-10-17 NOTE — Evaluation (Signed)
Occupational Therapy Evaluation Patient Details Name: Lisa [name] MRN: [id number] DOB: 05-Apr-1950 Today's Date: 10/17/2016    History of Present Illness 66 y.o. female admitted with urinary tract infection and left nephrolithiasis with some element of hydronephrosis. Patient suffered ventricular fibrillation cardiac arrest after Rocephin in the emergency department. Neurologically the patient seems to have recovered / intact.  She is currently being evaluated for treatment of her underlying coronary artery disease. Suspect her hypoxia is more related to atelectasis, volume overload, and some element of lung injury from her resuscitation efforts. CABG on hold for now due to right groin hemtoma   Clinical Impression   This 66 yo female admitted with above presents to acute OT with decreased balance, generalized weakness, pain, and cardiopulmonary issues all affecting her PLOF of being totally independent with basic ADLs, IADLs, and being involved in community. She will benefit from acute OT with follow up OT on CIR to get back to PLOF.     Follow Up Recommendations  CIR;Supervision/Assistance - 24 hour    Equipment Recommendations  3 in 1 bedside commode       Precautions / Restrictions Precautions Precautions: Fall Precaution Comments: 10/17/16 went into A-fib with RVR during OT session with HR as high as 172 (was in 120's when up on her feet and as soon as she sat down was when issues started) Restrictions Weight Bearing Restrictions: No      Mobility Bed Mobility Overal bed mobility: Needs Assistance             General bed mobility comments: Up in recliner upon arrival  Transfers Overall transfer level: Needs assistance Equipment used: Rolling walker (2 wheeled) Transfers: Sit to/from Stand Sit to Stand: Min assist;+2 safety/equipment         General transfer comment: Cues for hand placement and safety    Balance Overall balance assessment: Needs  assistance Sitting-balance support: No upper extremity supported;Feet supported Sitting balance-Leahy Scale: Fair     Standing balance support: Bilateral upper extremity supported;During functional activity Standing balance-Leahy Scale: Poor Standing balance comment: reliant on Bil UE support on RW                           ADL either performed or assessed with clinical judgement   ADL Overall ADL's : Needs assistance/impaired Eating/Feeding: Independent;Sitting   Grooming: Set up;Sitting   Upper Body Bathing: Set up;Supervision/ safety;Sitting   Lower Body Bathing: Maximal assistance Lower Body Bathing Details (indicate cue type and reason): min A sit<>stand Upper Body Dressing : Minimal assistance;Sitting   Lower Body Dressing: Maximal assistance Lower Body Dressing Details (indicate cue type and reason): min A sit<>stand Toilet Transfer: Minimal assistance;+2 for safety/equipment;Ambulation;RW Toilet Transfer Details (indicate cue type and reason): recliner around to end of bed Toileting- Clothing Manipulation and Hygiene: Maximal assistance Toileting - Clothing Manipulation Details (indicate cue type and reason): min A sit<>stand             Vision Patient Visual Report: No change from baseline              Pertinent Vitals/Pain Pain Assessment: Faces Faces Pain Scale: Hurts little more Pain Location: Chest wall pain, especially with coughing Pain Descriptors / Indicators: Aching Pain Intervention(s): Monitored during session;Repositioned;Other (comment) (used heart pillow)     Hand Dominance Right   Extremity/Trunk Assessment Upper Extremity Assessment Upper Extremity Assessment: Overall WFL for tasks assessed  Communication Communication Communication: No difficulties   Cognition Arousal/Alertness: Awake/alert Behavior During Therapy: WFL for tasks assessed/performed Overall Cognitive Status: Within Functional Limits for tasks  assessed                                                Home Living Family/patient expects to be discharged to:: Inpatient rehab Living Arrangements: Spouse/significant other Available Help at Discharge: Family;Available 24 hours/day Type of Home: House Home Access: Stairs to enter [employer] of Steps: 3 Entrance Stairs-Rails: None Home Layout: One level     Bathroom Shower/Tub: Occupational psychologist: Standard     Home Equipment: None          Prior Functioning/Environment Level of Independence: Independent        Comments: 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        OT Problem List: Decreased strength;Decreased activity tolerance;Impaired balance (sitting and/or standing);Cardiopulmonary status limiting activity      OT Treatment/Interventions: Self-care/ADL training;Therapeutic activities;Patient/family education;DME and/or AE instruction;Balance training    OT Goals(Current goals can be found in the care plan section) Acute Rehab OT Goals OT Goal Formulation: With patient Time For Goal Achievement: 10/31/16 Potential to Achieve Goals: Good  OT Frequency: Min 3X/week              AM-PAC PT "6 Clicks" Daily Activity     Outcome Measure Help from another person eating meals?: None Help from another person taking care of personal grooming?: A Little Help from another person toileting, which includes using toliet, bedpan, or urinal?: A Lot Help from another person bathing (including washing, rinsing, drying)?: A Lot Help from another person to put on and taking off regular upper body clothing?: A Little Help from another person to put on and taking off regular lower body clothing?: A Lot 6 Click Score: 16   End of Session Equipment Utilized During Treatment: Rolling walker;Gait belt Nurse Communication:  (what pt was doing when HR went into AFib with RVR)  Activity Tolerance: Other  (comment) (limited by Afib with RVR that started during session) Patient left: in chair;with call bell/phone within reach;with family/visitor present;with nursing/sitter in room  OT Visit Diagnosis: Unsteadiness on feet (R26.81);Pain Pain - part of body:  (chest when coughs)                Time: 2355-7322 OT Time Calculation (min): 30 min Charges:  OT General Charges $OT Visit: 1 Visit OT Evaluation $OT Eval Moderate Complexity: 1 Mod OT Treatments $Self Care/Home Management : 8-22 mins [place], OTR/L [phone] 10/17/2016

## 2016-10-17 NOTE — Progress Notes (Signed)
Progress Note  Patient Name: Lisa [name] Date of Encounter: 10/17/2016  Primary Cardiologist: End  Subjective   Currently with no complaints of chest discomfort. Shortness of breath improved. Right groin hematoma. Post transfusion  Inpatient Medications    Scheduled Meds: . ALPRAZolam  0.25 mg Oral Daily  . aspirin EC  81 mg Oral Daily  . atorvastatin  80 mg Oral q1800  . benzonatate  100 mg Oral TID  . ciprofloxacin  500 mg Oral BID  . feeding supplement (GLUCERNA SHAKE)  237 mL Oral TID WC  . guaiFENesin  600 mg Oral BID  . heparin subcutaneous  5,000 Units Subcutaneous Q8H  . insulin aspart  0-15 Units Subcutaneous TID WC  . insulin aspart  6 Units Subcutaneous TID WC  . insulin glargine  22 Units Subcutaneous Daily  . ipratropium-albuterol  3 mL Nebulization Q6H  . mouth rinse  15 mL Mouth Rinse BID  . predniSONE  20 mg Oral Q breakfast   Continuous Infusions:  PRN Meds: acetaminophen, ALPRAZolam, morphine injection, nitroGLYCERIN, ondansetron (ZOFRAN) IV, oxyCODONE-acetaminophen, sodium chloride   Vital Signs    Vitals:   10/17/16 0600 10/17/16 0628 10/17/16 0630 10/17/16 0635  BP: 135/64 135/64  135/64  Pulse: 94 (!) 105 (!) 104 97  Resp: 18 (!) 21 (!) 28 (!) 23  Temp:  97.9 F (36.6 C)  97.8 F (36.6 C)  TempSrc:  Oral  Oral  SpO2: 95% 94% 90% 96%  Weight: 142 lb 3.2 oz (64.5 kg)     Height: 5\' 3"  (1.6 m)       Intake/Output Summary (Last 24 hours) at 10/17/16 0817 Last data filed at 10/17/16 1610  Gross per 24 hour  Intake             1808 ml  Output             2650 ml  Net             -842 ml   Filed Weights   10/14/16 0500 10/15/16 0600 10/17/16 0600  Weight: 140 lb 14 oz (63.9 kg) 137 lb 12.6 oz (62.5 kg) 142 lb 3.2 oz (64.5 kg)    Telemetry    No adverse arrhythmias, no ventricular tachycardia or V. fib - Personally Reviewed  ECG    10/12/16-sinus rhythm with no significant ST segment changes - Personally Reviewed  Physical Exam     GEN: No acute distress.  Alert in chair Neck: No JVD Cardiac: RRR, no murmurs, rubs, or gallops.  Respiratory: Clear to auscultation bilaterally. Decreased air bilaterally GI: Soft, nontender, non-distended  MS: No edema; No deformity. Non tender groin, warm extremity Neuro:  Nonfocal  Psych: Normal affect   Labs    Chemistry Recent Labs Lab 10/11/16 0711  10/13/16 0452  10/14/16 0442 10/14/16 1347 10/15/16 0234 10/16/16 0219  NA 134*  < > 125*  < > 121* 124* 122* 126*  K 4.6  < > 3.7  < > 3.0* 3.4* 3.8 4.3  CL 98*  < > 96*  < > 86* 90* 88* 89*  CO2 27  < > 21*  < > 22 25 27 26   GLUCOSE 222*  < > 252*  < > 245* 185* 196* 286*  BUN 13  < > 20  < > 12 10 11 12   CREATININE 0.88  < > 0.80  < > 0.84 0.59 0.61 0.51  CALCIUM 9.0  < > 6.6*  < > 6.6* 6.8*  6.9* 7.5*  PROT 7.5  --  5.3*  --  5.6*  --   --   --   ALBUMIN 3.8  --  2.6*  --  2.6*  --  2.3*  --   AST 22  --  133*  --  87*  --   --   --   ALT 21  --  64*  --  55*  --   --   --   ALKPHOS 80  --  71  --  75  --   --   --   BILITOT 0.6  --  0.9  --  1.3*  --   --   --   GFRNONAA >60  < > >60  < > >60 >60 >60 >60  GFRAA >60  < > >60  < > >60 >60 >60 >60  ANIONGAP 9  < > 8  < > 13 9 7 11   < > = values in this interval not displayed.   Hematology Recent Labs Lab 10/16/16 0219 10/16/16 1519 10/17/16 0224  WBC 21.6* 31.1* 26.5*  RBC 3.00* 2.83* 2.52*  HGB 8.0* 7.7* 6.8*  HCT 24.1* 22.9* 20.2*  MCV 80.3 80.9 80.2  MCH 26.7 27.2 27.0  MCHC 33.2 33.6 33.7  RDW 13.7 13.6 13.8  PLT 222 252 266    Cardiac Enzymes Recent Labs Lab 10/11/16 1237 10/11/16 1651 10/11/16 2306 10/12/16 0455  TROPONINI 7.82* 7.77* 5.68* 3.19*   No results for input(s): TROPIPOC in the last 168 hours.   BNP Recent Labs Lab 10/13/16 1217 10/14/16 0443 10/15/16 0234  BNP 217.2* 129.5* 92.9     DDimer No results for input(s): DDIMER in the last 168 hours.   Radiology    Ct Femur Right Wo Contrast  Result Date:  10/16/2016 CLINICAL DATA:  Right thigh swelling. Raising vascular catheterization. EXAM: CT OF THE LOWER RIGHT EXTREMITY WITHOUT CONTRAST TECHNIQUE: Multidetector CT imaging of the right lower extremity was performed according to the standard protocol. COMPARISON:  CT pelvis 10/11/2016 FINDINGS: Bones/Joint/Cartilage Degenerative arthropathy of both hips. Small bilateral knee joint effusions. Ligaments Suboptimally assessed by CT. Muscles and Tendons There is no focal hematoma around the common femoral vasculature, but there is considerable high density expansion of the right hip adductor musculature, most notably the pectineus, adductor brevis, and adductor magnus muscles. Comparing to the contralateral side, the volume of adductor hematoma on the right is estimated at 420 cubic cm (I obtain this by subtracting the volume of the normal left-sided muscles from the volume of the expanded right-sided adductor musculature). There are some lower density regions within the musculature likely representing mixed density hematoma. Infection and abscess is considered less likely. Soft tissues Subcutaneous edema noted in the right thigh. Edema tracks along deep fascia planes of the medial compartment of the right thigh. Does Foley catheter in the urinary bladder. IMPRESSION: 1. Expansion and high density in the right hip adductor musculature as detailed above, volume of suspected hematoma at 420 cubic cm. There is edema in surrounding fascia planes and in the subcutaneous tissues of the right thigh. 2. No hematoma is observed along the common femoral vasculature directly. This raises suspicion that the right adductor hematoma is spontaneous. 3. Degenerative arthropathy of both hips. 4. Small bilateral knee joint effusions. Electronically Signed   By: Van Clines M.D.   On: 10/16/2016 14:03   Dg Chest Port 1 View  Result Date: 10/17/2016 CLINICAL DATA:  Cough, acute respiratory failure, hypoxia EXAM:  PORTABLE CHEST  1 VIEW COMPARISON:  10/16/2016 FINDINGS: Cardiomegaly. Diffuse left lung airspace disease, most confluent in the left upper lobe. Right basilar atelectasis or infiltrate. Findings are similar to prior study. Suspect small right effusion. IMPRESSION: Stable bilateral airspace opacities, left greater than right. Suspect small effusion on the right. No real change. Electronically Signed   By: Rolm Baptise M.D.   On: 10/17/2016 07:46   Dg Chest Port 1 View  Result Date: 10/16/2016 CLINICAL DATA:  Shortness of breath. EXAM: PORTABLE CHEST 1 VIEW COMPARISON:  October 15, 2016 FINDINGS: Left greater than right pulmonary opacities persist, improved in the interval. No other interval changes. IMPRESSION: Improving but persistent left greater than right pulmonary opacities. Electronically Signed   By: Dorise Bullion III M.D   On: 10/16/2016 08:40    Cardiac Studies   ECHO 10/12/16: Study Conclusions  - Left ventricle: The cavity size was normal. Systolic function was   normal. The estimated ejection fraction was in the range of 55%   to 60%. Wall motion was grossly normal; Unable to exclude mild   hypokinesis of the inferior wall. Left ventricular diastolic   function parameters were normal. - Left atrium: The atrium was normal in size. - Right ventricle: Systolic function was normal. - Pulmonary arteries: Systolic pressure was within the normal   range.  Cardiac catheterization 10/11/16 Conclusions: 1. Severe 2 vessel coronary artery disease, including heavily calcified 95% proximal/mid LCx and sequential 90-99% mid RCA stenoses. 2. Mild to moderate, nonobstructive disease involving the LMCA and LAD. 3. Overall preserved LV contraction with basal inferior hypokinesis. 4. Upper normal to mildly elevated left ventricular filling pressure (LVEDP 15-20 mmHg). 5. Peripheral vascular disease with ectasia and calcification of the infrarenal abdominal aorta and iliac arteries.  Recommendations: 1. I  suspect patient's cardiac arrest was due to demand ischemia in the setting of anaphylactic shock from allergic reaction to ceftriaxone. She has TIMI-3 flow in all major epicardial coronary arteries. I recommend medical therapy and treatment of the underlying infection. If she has a meaningful neurologic recovery, atherectomy/PCI to the RCA and LCx will need to be considered. ADDENDUM: Images reviewd on 10/12/16 at interventional conference. Given history of DM, severe calcification of lesions, and eccentricity of mid RCA stenosis, cardiac surgery consultation for CABG should be obtained. If Ms. Penaloza is deemed a poor surgical candidate, high-risk PCI with atherectomy could be considered. 2. Obtain transthoracic echocardiogram to confirm LVEF and evaluate for other structural abnormalities. 3. I would like to avoid intra-aortic balloon pump placement if possible, given aortic disease. If hypotension becomes a problem, judicious vasopressor support is recommended. 4. Low-dose aspirin and high intensity statin therapy. Consider heparin infusion (ACS nomogram) if no invasive procedures are necessary (i.e. percutaneous nephrostomy tube). 5. Consider addition of low-dose beta blocker if heart rate and blood pressure allow. If significant ventricular ectopy is observed, amiodarone infusion should be considered.  Diagnostic Diagram       Nelva Bush, MD  Patient Profile     66 y.o. female with V. Fib arrest in the emergency department who underwent cardiac catheterization showing 95% proximal mid LAD and 99% sequential mid RCA with prior history of COPD. TCTS consult.  Assessment & Plan    V. Fib arrest/Cardiac arrest  - No adverse arrhythmias currently on telemetry  - in review of Dr. Olin Pia note, arrest was felt to be secondary to demand ischemia in the setting of anaphylactic shock from ceftriaxone. She was placed on normothermia protocol, pressors,now  extubated.  Severe coronary artery  disease  - Circumflex and RCA, mild to moderate left main. EF preserved. Reviewed Dr. Prescott Gum note. The current underlying conditions, not a surgical candidate at this time. Continuing to optimize.  Groin hematoma  - Confirmed by CT scan.  - Hemoglobin dropped from 13 down to 6.8  - No compartment syndrome.  - IV heparin has been stopped.  - Dr. Wynonia Lawman ordered 1 unit transfusion on 10/17/16 at 3 AM after hemoglobin of 6.8  COPD  - On steroids, pulmonary toilet  - poor mechanics.  - pFTs will be repeated  Hyponatremia  - Slowly improving from 122-126 yesterday  Nephrolithiasis/UTI  - on Cipro for Klebsiella Escherichia coli  - her initial presentation was left flank plain.  Sinus tachycardia  - Groin bleed noted. Interestingly there was comment that it may have been spontaneous and not from femoral artery in an radiology report. Transfusion.  Diabetes  - Hemoglobin A1c  7  Critical care time 35 minutes spent with extensive chart review, data reviewed, discussion. Patient status post cardiac arrest with non-ST elevation myocardial infarction and acute respiratory failure awaiting potential revascularization with acute groin bleed.  For questions or updates, please contact [place] Please consult [email or web address] for contact info under Cardiology/STEMI.      Signed, Candee Furbish, MD  10/17/2016, 8:17 AM

## 2016-10-17 NOTE — Progress Notes (Deleted)
Pt felt clammy and dry mouth and thought blood sugar dropped. cbg checked >200. Will continue to monitor.

## 2016-10-17 NOTE — Progress Notes (Signed)
[facility]  Physician Requesting Consult:  Fransico Him, M.D. / Cardiology  Date of Consult:  10/14/2016  Reason for Consult/Chief Complaint:  Acute Hypoxic Respiratory Failure  History of Presenting Illness:  66 y.o. female, smoker, who presented to outside hospital on 9/11 with left flank pain. Imaging revealed a left ureteral stone as well as hydronephrosis. Rocephin was administered and very shortly after the patient developed a metallic taste and suffered cardiac arrest. Patient underwent resuscitation for approximately 45 minutes per cardiology documentation the patient required multiple shocks with ventricular fibrillation as the underlying rhythm problem. Patient reportedly was in complete heart block with a wide complex rhythm that prompted patient to undergo emergent left heart catheterization. She was subsequently found to have two-vessel coronary artery disease. Initially patient was on transcutaneous pacing. Patient was placed on normothermia protocol. Patient was subsequently extubated on 9/12 and per the electronic medical record began having increased shortness of breath with desaturation and crackles around 3 AM on 9/13. Lasix was ordered. Oxygen requirement increased from 2 L/m to 5 L/m to maintain saturation greater than 90%. Patient had significant diuresis with that dose of Lasix. Notably the patient also developed worsening hyponatremia as well. She continues to have significant chest wall pain from her previous CPR.  O2 needs improved with diuresis.       Subjective:  RN reports pt went into AF this am.  Noted CT with R thigh hematoma, hgb drop to 6.8 > 1 unit PRBC, up to 8.5.  Pt reports feeling more tired today.  Worked with PT yesterday.    Temp:  [97.6 F (36.4 C)-98 F (36.7 C)] 97.7 F (36.5 C) (09/17 1157) Pulse Rate:  [94-116] 107 (09/17 1000) Resp:  [15-29] 24 (09/17 1000) BP: (105-148)/(56-89) 144/60 (09/17 1000) SpO2:  [90 %-100 %] 100 %  (09/17 1000) FiO2 (%):  [24 %] 24 % (09/17 0919) Weight:  [142 lb 3.2 oz (64.5 kg)] 142 lb 3.2 oz (64.5 kg) (09/17 0600)  General:  Adult female sitting in chair in NAD HEENT: MM pink/moist, no jvd PSY: calm/appropriate  Neuro: AAOx4, speech clear, MAE CV: s1s2 irr irr, 120's on monitor, no m/r/g PULM: even/non-labored, lungs bilaterally clear DV:VOHY, non-tender, bsx4 active  Extremities: warm/dry, trace BLE edema  Skin: no rashes or lesions    CBC Latest Ref Rng & Units 10/17/2016 10/17/2016 10/16/2016  WBC 4.0 - 10.5 K/uL - 26.5(H) 31.1(H)  Hemoglobin 12.0 - 15.0 g/dL 8.8(L) 6.8(LL) 7.7(L)  Hematocrit 36.0 - 46.0 % 26.0(L) 20.2(L) 22.9(L)  Platelets 150 - 400 K/uL - 266 252   BMP Latest Ref Rng & Units 10/17/2016 10/16/2016 10/15/2016  Glucose 65 - 99 mg/dL 245(H) 286(H) 196(H)  BUN 6 - 20 mg/dL 26(H) 12 11  Creatinine 0.44 - 1.00 mg/dL 0.79 0.51 0.61  Sodium 135 - 145 mmol/L 124(L) 126(L) 122(L)  Potassium 3.5 - 5.1 mmol/L 4.7 4.3 3.8  Chloride 101 - 111 mmol/L 89(L) 89(L) 88(L)  CO2 22 - 32 mmol/L _0 Calcium 8.9 - 10.3 mg/dL 8.0(L) 7.5(L) 6.9(L)    Hepatic Function Latest Ref Rng & Units 10/15/2016 10/14/2016 10/13/2016  Total Protein 6.5 - 8.1 g/dL - 5.6(L) 5.3(L)  Albumin 3.5 - 5.0 g/dL 2.3(L) 2.6(L) 2.6(L)  AST 15 - 41 U/L - 87(H) 133(H)  ALT 14 - 54 U/L - 55(H) 64(H)  Alk Phosphatase 38 - 126 U/L - 75 71  Total Bilirubin 0.3 - 1.2 mg/dL - 1.3(H) 0.9  IMAGING/STUDIES: CT RENAL/STONE STUDY 9/11: IMPRESSION: 1. 4 mm distal left ureteral calculus with mild hydroureteronephrosis. 2. Punctate nonobstructing bilateral renal calculi. 3. Small hiatal hernia. 4. Aortic Atherosclerosis (ICD10-I70.0). Mild infrarenal aortic ectasia and suspected short segment aortic dissection. LHC 9/11: 1. Severe 2 vessel coronary artery disease, including heavily calcified 95% proximal/mid LCx and sequential 90-99% mid RCA stenoses. 2. Mild to moderate, nonobstructive disease  involving the LMCA and LAD. 3. Overall preserved LV contraction with basal inferior hypokinesis. 4. Upper normal to mildly elevated left ventricular filling pressure (LVEDP 15-20 mmHg). 5. Peripheral vascular disease with ectasia and calcification of the infrarenal abdominal aorta and iliac arteries. TTE 9/12:  LV normal in size with EF 55-60% & normal regional wall motion grossly but unable to exclude hypokinesis of inferior wall. Normal diastolic function. LA & RA normal in size. RV normal in size and function. Aortic valve poorly visualized but without obvious stenosis or regurgitation. Aortic root normal in size. No mitral stenosis or regurgitation. No pulmonic stenosis. Mild tricuspid regurgitation. No pericardial effusion. RENAL U/S 9/14: Mild left hydronephrosis, improved from CT 3 days ago.  PORT CXR 9/14: Endotracheal tube removed. Patchy bilateral alveolar and interstitial opacities with some suggestion of consolidation on the left. These findings are worse on the left compared with the right. Blunting of right costophrenic angle with meniscus suggestive of pleural effusion. Spirometry 9/14:  FVC 0.95 L (31%) FEV1 0.79 L (34%) FEV1/FVC 0.84,  FEF 25-75 0.86 L (41%) CT RIGHT FEMUR 9/16:  Expansion & high density in the R hip adductor musculature, suspected hematoma volume 420 cubic cm, edema surrounding the fascia planes & in the SQ tissues of the R thigh, no hematoma observed along the common femoral vasculature (raises concern that the hematoma is spontaneous), degenerative arthropathy of both hips, small bilateral knee joint effusions  MICROBIOLOGY: MRSA PCR 9/11:  Negative Urine Culture 9/11:  E coli & Klebisella pneumoniae (both sensitive to Cipro)   ANTIBIOTICS: Rocephin 9/11 (possible anaphylaxis after 1 dose) Cipro 9/11 >>  LINES/TUBES: OETT 9/11 - 9/12 R FEM CVL 9/11 >>> 9/11 Foley >>>  SIGNIFICANT EVENTS: 09/11 - Admit to [facility] after cardiac arrest >> started normothermia  protocol & underwent LHC. 09/12 - Extubated 09/13 - Increased SOB & hypoxia (2L >> 5L) around 3am>>Lasix given. Transfer from [facility] to [facility]. 09/14 - PCCM consult for hypoxia & possible multi-focal pneumonia. Additional lasix given overnight.  ASSESSMENT/PLAN:  66 y.o. female admitted with urinary tract infection and left nephrolithiasis with some element of hydronephrosis. Patient suffered ventricular fibrillation cardiac arrest after Rocephin in the emergency department. Neurologically the patient seems to have recovered / intact.  She is currently being evaluated for treatment of her underlying coronary artery disease. Suspect her hypoxia is more related to atelectasis, volume overload, and some element of lung injury from her resuscitation efforts. Given her improving Procalcitonin and leukocytosis as well as her clinical history I'm less suspicious about an evolving healthcare associated pneumonia. Even so, this must be watched closely. Concern her hyponatremia is secondary to low intracellular potassium further depleted by IV Lasix resulting in subsequent electrolyte shifts and hyponatremia.  In addition, she has been hyperglycemic, poor PO intake and has resolving AKI.   Acute hypoxic respiratory failure; COPD exacerbation; Aspiration pneumonia vs Atelectasis;Tobacco abuse - likely multifactorial in etiology with post arrest atelectasis, underlying undiagnosed COPD and edema vs PNA.  Improving.  P: Wean O2 for sats 88-95%; currently on 1L O2 Pulmonary hygiene- IS, mobilize, flutter valve  Will  need ambulatory O2 assessment prior to discharge  Tessalon perles for cough Change Duoneb to Xopenex + atrovent given new AF Prednisone 38m QD Nasal saline for comfort  Smoking cessation counseling   AF with RVR P: Tele monitoring  Restart home dose Cardizem; avoid beta blocker if possible given recent bronchospasm Mgmt per Cardiology    Hyponatremia, resolved AKI,  Hypophosphatemia P: Hyponatremia thought due to combination of poor PO intake and fluid overload. Appetite is picking up today.  Trend BMP / urinary output Replace electrolytes as indicated Avoid nephrotoxic agents, ensure adequate renal perfusion Lasix 40 mg IV x1  Sodium phos 20 mmol now  Anemia; Thigh Hematoma - multifactorial secondary to critical illness, acute R thigh hematoma s/p 1 unit PRBC 9/18 Right Thigh Hematoma - appears to be spontaneous bleed, not from femoral  P: Trend CBC  Monitor right thigh Add SCD's for DVT prophylaxis  Hold heparin SQ 9/17, rescheduled for restart am 9/18  E-Coli & Klebsiella Pneumonia UTI - sensitive to Cipro  P: D7/7 abx  Discontinue cipro 9/17, monitor off abx   DM: continues to be hyperglycemic; increase SSI today.    BNoe Gens NP-C [facility] Pgr: [phone] or if no answer [phone]/17/2018, 1:00 PM   Attending Addendum: I personally examined this patient and agree with plan as detailed above. Briefly this is a 89yoFwith COPD and Afib, who ws given Ceftriaxone for a UTI and developed anaphylaxis leading to cardiac arrest. Now with improving hypoxia. Continuing diuresis. Afib with RVR today restarting diltiazem. Thigh hematoma and Anemia; monitoring. Hold heparin.   35 minutes critical care time   KVernie Murders MD Pulmonary & Critical Care

## 2016-10-17 NOTE — Care Management Note (Signed)
Case Management Note Marvetta Gibbons RN, BSN Unit 4E-Case Manager-- New Site coverage [phone]  Patient Details  Name: Lisa [name] MRN: [id number] Date of Birth: [birth year]  Subjective/Objective:    Pt presented to [facility] on 9/11 with flank pain and was found to have nephrolithiasis with possible UTI. Immediately after receiving IV Rocephin in the ED, patient started with seizure-like activity and suffered Vfib arrest.- tx to [facility] for further care Currently on HFNC               Action/Plan: PTA pt lived at home- CM to follow for d/c needs  Expected Discharge Date:                  Expected Discharge Plan:     In-House Referral:     Discharge planning Services  CM Consult  Post Acute Care Choice:    Choice offered to:     DME Arranged:    DME Agency:     HH Arranged:    [place] Agency:     Status of Service:  In process, will continue to follow  If discussed at Long Length of Stay Meetings, dates discussed:    Discharge Disposition:   Additional Comments:  10/17/16- 71- Marti Mclane RN, CM- pt found to have severe CAD- however not a surgical candidate for CABG. ??high risk PCI- CM will continue to follow.   Dawayne Patricia, RN 10/17/2016, 11:00 AM

## 2016-10-18 ENCOUNTER — Other Ambulatory Visit (HOSPITAL_COMMUNITY): Payer: Self-pay

## 2016-10-18 ENCOUNTER — Inpatient Hospital Stay (HOSPITAL_COMMUNITY): Payer: Managed Care, Other (non HMO)

## 2016-10-18 LAB — GLUCOSE, CAPILLARY
GLUCOSE-CAPILLARY: 331 mg/dL — AB (ref 65–99)
GLUCOSE-CAPILLARY: 279 mg/dL — AB (ref 65–99)
Glucose-Capillary: 243 mg/dL — ABNORMAL HIGH (ref 65–99)
Glucose-Capillary: 358 mg/dL — ABNORMAL HIGH (ref 65–99)
Glucose-Capillary: 272 mg/dL — ABNORMAL HIGH (ref 65–99)

## 2016-10-18 LAB — BASIC METABOLIC PANEL
ANION GAP: 12 (ref 5–15)
BUN: 23 mg/dL — AB (ref 6–20)
CALCIUM: 7.9 mg/dL — AB (ref 8.9–10.3)
CO2: 29 mmol/L (ref 22–32)
Chloride: 89 mmol/L — ABNORMAL LOW (ref 101–111)
Creatinine, Ser: 0.74 mg/dL (ref 0.44–1.00)
GFR calc Af Amer: >60 mL/min (ref >60)
GLUCOSE: 195 mg/dL — AB (ref 65–99)
Potassium: 4 mmol/L (ref 3.5–5.1)
SODIUM: 130 mmol/L — AB (ref 135–145)

## 2016-10-18 LAB — CBC
HCT: 26 % — ABNORMAL LOW (ref 36.0–46.0)
Hemoglobin: 9 g/dL — ABNORMAL LOW (ref 12.0–15.0)
MCH: 28.7 pg (ref 26.0–34.0)
MCHC: 34.6 g/dL (ref 30.0–36.0)
MCV: 82.8 fL (ref 78.0–100.0)
PLATELETS: 308 10*3/uL (ref 150–400)
RBC: 3.14 MIL/uL — ABNORMAL LOW (ref 3.87–5.11)
RDW: 14.4 % (ref 11.5–15.5)
WBC: 23.6 10*3/uL — AB (ref 4.0–10.5)

## 2016-10-18 LAB — RENAL FUNCTION PANEL
ALBUMIN: 2.6 g/dL — AB (ref 3.5–5.0)
ANION GAP: 11 (ref 5–15)
BUN: 21 mg/dL — ABNORMAL HIGH (ref 6–20)
CALCIUM: 8.3 mg/dL — AB (ref 8.9–10.3)
CO2: 30 mmol/L (ref 22–32)
Chloride: 86 mmol/L — ABNORMAL LOW (ref 101–111)
Creatinine, Ser: 0.77 mg/dL (ref 0.44–1.00)
GFR calc non Af Amer: >60 mL/min (ref >60)
Glucose, Bld: 331 mg/dL — ABNORMAL HIGH (ref 65–99)
PHOSPHORUS: 2 mg/dL — AB (ref 2.5–4.6)
Potassium: 3.5 mmol/L (ref 3.5–5.1)
SODIUM: 127 mmol/L — AB (ref 135–145)

## 2016-10-18 LAB — BPAM RBC
Blood Product Expiration Date: 201810022359
ISSUE DATE / TIME: 201809170336
UNIT TYPE AND RH: 6200

## 2016-10-18 LAB — OSMOLALITY, URINE: Osmolality, Ur: 270 mOsm/kg — ABNORMAL LOW (ref 300–900)

## 2016-10-18 LAB — TYPE AND SCREEN
ABO/RH(D): A POS
ANTIBODY SCREEN: NEGATIVE
Unit division: 0

## 2016-10-18 LAB — OSMOLALITY: Osmolality: 286 mOsm/kg (ref 275–295)

## 2016-10-18 MED ORDER — DILTIAZEM HCL ER COATED BEADS 240 MG PO CP24
240.0000 mg | ORAL_CAPSULE | Freq: Every day | ORAL | Status: DC
  Administered 2016-10-18 – 2016-10-19 (×2): 240 mg via ORAL
  Filled 2016-10-18 (×2): qty 1

## 2016-10-18 MED ORDER — LEVALBUTEROL HCL 1.25 MG/0.5ML IN NEBU
1.2500 mg | INHALATION_SOLUTION | Freq: Three times a day (TID) | RESPIRATORY_TRACT | Status: DC
  Administered 2016-10-18 – 2016-10-19 (×4): 1.25 mg via RESPIRATORY_TRACT
  Filled 2016-10-18 (×4): qty 0.5

## 2016-10-18 MED ORDER — POTASSIUM CHLORIDE CRYS ER 20 MEQ PO TBCR
40.0000 meq | EXTENDED_RELEASE_TABLET | Freq: Once | ORAL | Status: AC
  Administered 2016-10-18: 40 meq via ORAL
  Filled 2016-10-18: qty 2

## 2016-10-18 MED ORDER — DILTIAZEM HCL ER COATED BEADS 120 MG PO CP24: 120.0000 mg | ORAL_CAPSULE | Freq: Every day | ORAL | Status: DC

## 2016-10-18 MED ORDER — BENZONATATE 100 MG PO CAPS
100.0000 mg | ORAL_CAPSULE | Freq: Three times a day (TID) | ORAL | Status: DC | PRN
  Administered 2016-10-18: 100 mg via ORAL
  Filled 2016-10-18: qty 1

## 2016-10-18 MED ORDER — SODIUM PHOSPHATES 45 MMOLE/15ML IV SOLN
30.0000 mmol | Freq: Once | INTRAVENOUS | Status: AC
  Administered 2016-10-18: 30 mmol via INTRAVENOUS
  Filled 2016-10-18: qty 10

## 2016-10-18 MED ORDER — FUROSEMIDE 10 MG/ML IJ SOLN
40.0000 mg | Freq: Once | INTRAMUSCULAR | Status: AC
  Administered 2016-10-18: 40 mg via INTRAVENOUS
  Filled 2016-10-18: qty 4

## 2016-10-18 MED ORDER — IPRATROPIUM BROMIDE 0.02 % IN SOLN
0.5000 mg | Freq: Three times a day (TID) | RESPIRATORY_TRACT | Status: DC
  Administered 2016-10-18 – 2016-10-19 (×4): 0.5 mg via RESPIRATORY_TRACT
  Filled 2016-10-18 (×4): qty 2.5

## 2016-10-18 NOTE — Progress Notes (Signed)
Pt converted back to AFib RVR HR 120-140's. Pt currently asymptomatic. VSS. MD notified. Verbal order given to place pt back on cardizem drip. Will continue to monitor. Isac Caddy, RN

## 2016-10-18 NOTE — Progress Notes (Signed)
This note also relates to the following rows which could not be included: Pulse Rate - Cannot attach notes to unvalidated device data SpO2 - Cannot attach notes to unvalidated device data  RT took patient off O2 and placed on room air per MD.

## 2016-10-18 NOTE — Progress Notes (Signed)
Progress Note  Patient Name: Lisa [name] Date of Encounter: 10/18/2016  Primary Cardiologist: End  Subjective   On 9/17 had AFIB with RVR, converted with IV diltiazem. Feels better, no CP, no SOB. Just tired.   Inpatient Medications    Scheduled Meds: . ALPRAZolam  0.25 mg Oral Daily  . aspirin EC  81 mg Oral Daily  . atorvastatin  80 mg Oral q1800  . benzonatate  100 mg Oral TID  . feeding supplement (GLUCERNA SHAKE)  237 mL Oral TID WC  . guaiFENesin  600 mg Oral BID  . heparin subcutaneous  5,000 Units Subcutaneous Q8H  . insulin aspart  0-20 Units Subcutaneous TID WC  . insulin aspart  6 Units Subcutaneous TID WC  . insulin glargine  25 Units Subcutaneous Daily  . ipratropium  0.5 mg Nebulization TID  . levalbuterol  1.25 mg Nebulization TID  . mouth rinse  15 mL Mouth Rinse BID  . predniSONE  20 mg Oral Q breakfast   Continuous Infusions: . diltiazem (CARDIZEM) infusion 10 mg/hr (10/18/16 0600)   PRN Meds: acetaminophen, ALPRAZolam, levalbuterol, morphine injection, nitroGLYCERIN, ondansetron (ZOFRAN) IV, oxyCODONE-acetaminophen, sodium chloride   Vital Signs    Vitals:   10/18/16 0343 10/18/16 0400 10/18/16 0500 10/18/16 0600  BP:  137/64 136/62 (!) 148/66  Pulse:  90 85 94  Resp:  19 15 20   Temp: 97.7 F (36.5 C)     TempSrc: Oral     SpO2:  97% 99% 97%  Weight:  142 lb 3.2 oz (64.5 kg)    Height:  5\' 3"  (1.6 m)      Intake/Output Summary (Last 24 hours) at 10/18/16 0644 Last data filed at 10/18/16 0600  Gross per 24 hour  Intake           925.67 ml  Output             4815 ml  Net         -3889.33 ml   Filed Weights   10/15/16 0600 10/17/16 0600 10/18/16 0400  Weight: 137 lb 12.6 oz (62.5 kg) 142 lb 3.2 oz (64.5 kg) 142 lb 3.2 oz (64.5 kg)    Telemetry    Now NSR, no AFIB with RVR as on 9/17 - Personally Reviewed  ECG    10/12/16-sinus rhythm with no significant ST segment changes - Personally Reviewed  Physical Exam   GEN: Well  nourished, well developed, in no acute distress  HEENT: normal  Neck: no JVD, carotid bruits, or masses Cardiac: RRR; no murmurs, rubs, or gallops,no edema  Respiratory:  clear to auscultation bilaterally, normal work of breathing GI: soft, nontender, nondistended, + BS MS: no deformity or atrophy  Skin: warm and dry, no rash Neuro:  Alert and Oriented x 3, Strength and sensation are intact Psych: euthymic mood, full affect   Labs    Chemistry Recent Labs Lab 10/11/16 0711  10/13/16 0452  10/14/16 0442  10/15/16 0234 10/16/16 0219 10/17/16 0930 10/18/16 0240  NA 134*  < > 125*  < > 121*  < > 122* 126* 124* 130*  K 4.6  < > 3.7  < > 3.0*  < > 3.8 4.3 4.7 4.0  CL 98*  < > 96*  < > 86*  < > 88* 89* 89* 89*  CO2 27  < > 21*  < > 22  < > 27 26 28 29   GLUCOSE 222*  < > 252*  < > 245*  < >  196* 286* 245* 195*  BUN 13  < > 20  < > 12  < > 11 12 26* 23*  CREATININE 0.88  < > 0.80  < > 0.84  < > 0.61 0.51 0.79 0.74  CALCIUM 9.0  < > 6.6*  < > 6.6*  < > 6.9* 7.5* 8.0* 7.9*  PROT 7.5  --  5.3*  --  5.6*  --   --   --   --   --   ALBUMIN 3.8  --  2.6*  --  2.6*  --  2.3*  --   --   --   AST 22  --  133*  --  87*  --   --   --   --   --   ALT 21  --  64*  --  55*  --   --   --   --   --   ALKPHOS 80  --  71  --  75  --   --   --   --   --   BILITOT 0.6  --  0.9  --  1.3*  --   --   --   --   --   GFRNONAA >60  < > >60  < > >60  < > >60 >60 >60 >60  GFRAA >60  < > >60  < > >60  < > >60 >60 >60 >60  ANIONGAP 9  < > 8  < > 13  < > 7 11 7 12   < > = values in this interval not displayed.   Hematology  Recent Labs Lab 10/16/16 1519 10/17/16 0224 10/17/16 0817 10/18/16 0240  WBC 31.1* 26.5*  --  23.6*  RBC 2.83* 2.52*  --  3.14*  HGB 7.7* 6.8* 8.8* 9.0*  HCT 22.9* 20.2* 26.0* 26.0*  MCV 80.9 80.2  --  82.8  MCH 27.2 27.0  --  28.7  MCHC 33.6 33.7  --  34.6  RDW 13.6 13.8  --  14.4  PLT 252 266  --  308    Cardiac Enzymes  Recent Labs Lab 10/11/16 1237 10/11/16 1651  10/11/16 2306 10/12/16 0455  TROPONINI 7.82* 7.77* 5.68* 3.19*   No results for input(s): TROPIPOC in the last 168 hours.   BNP  Recent Labs Lab 10/13/16 1217 10/14/16 0443 10/15/16 0234  BNP 217.2* 129.5* 92.9     DDimer No results for input(s): DDIMER in the last 168 hours.   Radiology    Ct Femur Right Wo Contrast  Result Date: 10/16/2016 CLINICAL DATA:  Right thigh swelling. Raising vascular catheterization. EXAM: CT OF THE LOWER RIGHT EXTREMITY WITHOUT CONTRAST TECHNIQUE: Multidetector CT imaging of the right lower extremity was performed according to the standard protocol. COMPARISON:  CT pelvis 10/11/2016 FINDINGS: Bones/Joint/Cartilage Degenerative arthropathy of both hips. Small bilateral knee joint effusions. Ligaments Suboptimally assessed by CT. Muscles and Tendons There is no focal hematoma around the common femoral vasculature, but there is considerable high density expansion of the right hip adductor musculature, most notably the pectineus, adductor brevis, and adductor magnus muscles. Comparing to the contralateral side, the volume of adductor hematoma on the right is estimated at 420 cubic cm (I obtain this by subtracting the volume of the normal left-sided muscles from the volume of the expanded right-sided adductor musculature). There are some lower density regions within the musculature likely representing mixed density hematoma. Infection and abscess is considered less likely. Soft tissues Subcutaneous edema  noted in the right thigh. Edema tracks along deep fascia planes of the medial compartment of the right thigh. Does Foley catheter in the urinary bladder. IMPRESSION: 1. Expansion and high density in the right hip adductor musculature as detailed above, volume of suspected hematoma at 420 cubic cm. There is edema in surrounding fascia planes and in the subcutaneous tissues of the right thigh. 2. No hematoma is observed along the common femoral vasculature directly. This  raises suspicion that the right adductor hematoma is spontaneous. 3. Degenerative arthropathy of both hips. 4. Small bilateral knee joint effusions. Electronically Signed   By: Van Clines M.D.   On: 10/16/2016 14:03   Dg Chest Port 1 View  Result Date: 10/17/2016 CLINICAL DATA:  Cough, acute respiratory failure, hypoxia EXAM: PORTABLE CHEST 1 VIEW COMPARISON:  10/16/2016 FINDINGS: Cardiomegaly. Diffuse left lung airspace disease, most confluent in the left upper lobe. Right basilar atelectasis or infiltrate. Findings are similar to prior study. Suspect small right effusion. IMPRESSION: Stable bilateral airspace opacities, left greater than right. Suspect small effusion on the right. No real change. Electronically Signed   By: Rolm Baptise M.D.   On: 10/17/2016 07:46   Dg Chest Port 1 View  Result Date: 10/16/2016 CLINICAL DATA:  Shortness of breath. EXAM: PORTABLE CHEST 1 VIEW COMPARISON:  October 15, 2016 FINDINGS: Left greater than right pulmonary opacities persist, improved in the interval. No other interval changes. IMPRESSION: Improving but persistent left greater than right pulmonary opacities. Electronically Signed   By: Dorise Bullion III M.D   On: 10/16/2016 08:40    Cardiac Studies   ECHO 10/12/16: Study Conclusions  - Left ventricle: The cavity size was normal. Systolic function was   normal. The estimated ejection fraction was in the range of 55%   to 60%. Wall motion was grossly normal; Unable to exclude mild   hypokinesis of the inferior wall. Left ventricular diastolic   function parameters were normal. - Left atrium: The atrium was normal in size. - Right ventricle: Systolic function was normal. - Pulmonary arteries: Systolic pressure was within the normal   range.  Cardiac catheterization 10/11/16 Conclusions: 1. Severe 2 vessel coronary artery disease, including heavily calcified 95% proximal/mid LCx and sequential 90-99% mid RCA stenoses. 2. Mild to moderate,  nonobstructive disease involving the LMCA and LAD. 3. Overall preserved LV contraction with basal inferior hypokinesis. 4. Upper normal to mildly elevated left ventricular filling pressure (LVEDP 15-20 mmHg). 5. Peripheral vascular disease with ectasia and calcification of the infrarenal abdominal aorta and iliac arteries.  Recommendations: 1. I suspect patient's cardiac arrest was due to demand ischemia in the setting of anaphylactic shock from allergic reaction to ceftriaxone. She has TIMI-3 flow in all major epicardial coronary arteries. I recommend medical therapy and treatment of the underlying infection. If she has a meaningful neurologic recovery, atherectomy/PCI to the RCA and LCx will need to be considered. ADDENDUM: Images reviewd on 10/12/16 at interventional conference. Given history of DM, severe calcification of lesions, and eccentricity of mid RCA stenosis, cardiac surgery consultation for CABG should be obtained. If Ms. Allensworth is deemed a poor surgical candidate, high-risk PCI with atherectomy could be considered. 2. Obtain transthoracic echocardiogram to confirm LVEF and evaluate for other structural abnormalities. 3. I would like to avoid intra-aortic balloon pump placement if possible, given aortic disease. If hypotension becomes a problem, judicious vasopressor support is recommended. 4. Low-dose aspirin and high intensity statin therapy. Consider heparin infusion (ACS nomogram) if no invasive procedures are  necessary (i.e. percutaneous nephrostomy tube). 5. Consider addition of low-dose beta blocker if heart rate and blood pressure allow. If significant ventricular ectopy is observed, amiodarone infusion should be considered.  Diagnostic Diagram       Nelva Bush, MD  Patient Profile     66 y.o. female with V. Fib arrest on 9/11 in the emergency department after getting ceftriaxone IV who underwent cardiac catheterization showing 95% proximal mid LAD and 99% sequential  mid RCA with prior history of COPD. Had PAF on 9/17. Slow progression.   Assessment & Plan    V. Fib arrest/Cardiac arrest  - No adverse arrhythmias currently on telemetry  - in review of Dr. Olin Pia note, arrest was felt to be secondary to demand ischemia in the setting of anaphylactic shock from ceftriaxone. She was placed on normothermia protocol, pressors,now extubated.  Severe coronary artery disease  - Mid LAD 99%, Circumflex and RCA, mild to moderate left main. EF preserved. Reviewed Dr. Darcey Nora note. Given her current underlying conditions, not felt to be a surgical candidate.  He recommends PCI to LAD. Discussed with Dr. Ellyn Hack of interventional team to help formulate a plan. Continuing to optimize.  Paroxysmal atrial fibrillation  - on 10/17/16   - converted after IV diltiazem  - changing to PO  Groin hematoma  - Confirmed by CT scan on 9/16. Perhaps spontaneous bleed per report.   - Hemoglobin dropped from 13 down to 6.8  - No compartment syndrome.  - IV heparin has been stopped.  - Dr. Wynonia Lawman ordered 1 unit transfusion on 10/17/16 at 3 AM after hemoglobin of 6.8. Responded well.   COPD  - Pulmonary following.   - On steroids, pulmonary toilet  - poor mechanics.   Hyponatremia  - Slowly improving from 122-126-130   Nephrolithiasis/UTI  - on Cipro for Klebsiella Escherichia coli  - her initial presentation was left flank plain.  Diabetes  - Hemoglobin A1c  7  - per IM/ CCM team   For questions or updates, please contact [place] Please consult [email or web address] for contact info under Cardiology/STEMI.      Signed, Candee Furbish, MD  10/18/2016, 6:44 AM

## 2016-10-18 NOTE — Progress Notes (Addendum)
Telephone report called and given to Urban Gibson, Therapist, sports (4E).  Pt and family at bedside aware of transfer.  Belongings gathered and pt ready for transfer to 4E17.

## 2016-10-18 NOTE — Progress Notes (Signed)
CSW met with patient, patient spouse, and patient daughter to discuss PT recommendations for rehab.  Patient and family are not interested in rehab placement at this time- patient adamant about returning home when cleared from the hospital and family believes they can manage patient needs at home.  CSW informed pt that current recommendation is for consult to see if patient is eligible for CIR- patient not interested in CIR program either and maintains desire to return home when stable for DC  CSW informed RNCM of desire for home services when stable for DC- CSW signing off  Jenna H. Uris, LCSW Clinical Social Worker [phone]  

## 2016-10-18 NOTE — Progress Notes (Signed)
Physical Therapy Treatment Patient Details Name: Lisa [name] MRN: [id number] DOB: January 05, 1951 Today's Date: 10/18/2016    History of Present Illness 66 y.o. female admitted with urinary tract infection and left nephrolithiasis with some element of hydronephrosis. Patient suffered ventricular fibrillation cardiac arrest after Rocephin in the emergency department. Neurologically the patient seems to have recovered / intact.  She is currently being evaluated for treatment of her underlying coronary artery disease. Suspect her hypoxia is more related to atelectasis, volume overload, and some element of lung injury from her resuscitation efforts. CABG on hold for now due to right groin hemtoma    PT Comments    Pt admitted with above diagnosis. Pt currently with functional limitations due to balance and endurance deficits. Pt was able to ambulate but only a short distance with mod assist for stability.  Desats as well and needed 3L O2 to keep sats >90%.  Will continue to follow pt.   Pt will benefit from skilled PT to increase their independence and safety with mobility to allow discharge to the venue listed below.     Follow Up Recommendations  CIR;Other (comment) (post-acute rehab)     Equipment Recommendations  Rolling walker with 5" wheels;3in1 (PT)    Recommendations for Other Services OT consult (order placed per protocol)     Precautions / Restrictions Precautions Precautions: Fall Precaution Comments: 10/17/16 went into A-fib with RVR during OT session with HR as high as 172 (was in 120's when up on her feet and as soon as she sat down was when issues started) Restrictions Weight Bearing Restrictions: No    Mobility  Bed Mobility Overal bed mobility: Needs Assistance Bed Mobility: Supine to Sit     Supine to sit: Mod assist     General bed mobility comments: Needed assist for LEs and elevation of trunk.   Transfers Overall transfer level: Needs assistance Equipment  used: Rolling walker (2 wheeled) Transfers: Sit to/from Stand Sit to Stand: Min assist;Mod assist;+2 physical assistance         General transfer comment: Cues for hand placement and safety.  Assist to power up and then steadying to stay up.  Flexed posture.  Ambulation/Gait Ambulation/Gait assistance: Min assist;+2 safety/equipment;Mod assist Ambulation Distance (Feet): 12 Feet Assistive device: Rolling walker (2 wheeled) Gait Pattern/deviations: Step-to pattern;Decreased step length - right;Decreased step length - left;Decreased stride length;Shuffle;Drifts right/left;Trunk flexed;Leaning posteriorly;Narrow base of support   Gait velocity interpretation: Below normal speed for age/gender General Gait Details: Cues to for deep breathing and to self-monitor for activity tolerance; very weak.  needed cues and assist to stand tall but pt with difficulty therefore pt needing mod assist at times due to posterior lean.     Stairs            Wheelchair Mobility    Modified Rankin (Stroke Patients Only)       Balance Overall balance assessment: Needs assistance Sitting-balance support: No upper extremity supported;Feet supported Sitting balance-Leahy Scale: Fair     Standing balance support: Bilateral upper extremity supported;During functional activity Standing balance-Leahy Scale: Poor Standing balance comment: reliant on Bil UE support on RW                            Cognition Arousal/Alertness: Awake/alert Behavior During Therapy: WFL for tasks assessed/performed Overall Cognitive Status: Within Functional Limits for tasks assessed  Exercises General Exercises - Lower Extremity Ankle Circles/Pumps: AROM;Both;10 reps;Supine Long Arc Quad: AROM;Both;10 reps;Seated    General Comments General comments (skin integrity, edema, etc.): Pt needed 3LO2 to keep sats > 905.  Desat to 83% on 1L with activity.   once finished, placed pt back on 1L with O2 95%.  Other VSS      Pertinent Vitals/Pain Pain Assessment: Faces Faces Pain Scale: No hurt    Home Living                      Prior Function            PT Goals (current goals can now be found in the care plan section) Progress towards PT goals: Progressing toward goals    Frequency    Min 3X/week      PT Plan Current plan remains appropriate    Co-evaluation              AM-PAC PT "6 Clicks" Daily Activity  Outcome Measure  Difficulty turning over in bed (including adjusting bedclothes, sheets and blankets)?: A Lot Difficulty moving from lying on back to sitting on the side of the bed? : A Lot Difficulty sitting down on and standing up from a chair with arms (e.g., wheelchair, bedside commode, etc,.)?: A Lot Help needed moving to and from a bed to chair (including a wheelchair)?: A Lot Help needed walking in hospital room?: A Lot Help needed climbing 3-5 steps with a railing? : A Lot 6 Click Score: 12    End of Session Equipment Utilized During Treatment: Gait belt;Oxygen Activity Tolerance: Patient limited by fatigue Patient left: in chair;with call bell/phone within reach;with family/visitor present Nurse Communication: Mobility status PT Visit Diagnosis: Unsteadiness on feet (R26.81);Other abnormalities of gait and mobility (R26.89)     Time: 0539-7673 PT Time Calculation (min) (ACUTE ONLY): 18 min  Charges:  $Gait Training: 8-22 mins                    G Codes:       Lisa [name],PT Acute Rehabilitation [phone] (pager)    Lisa [name] 10/18/2016, 11:54 AM

## 2016-10-18 NOTE — Progress Notes (Signed)
Pt received from [facility] RN. Pt and family oriented to room and equipment. VSS. Telemetry applied, CCMD notified. Breakfast order placed per pt request. Call bell within reach. Will give report to oncoming RN.   Fritz Pickerel, RN

## 2016-10-18 NOTE — Progress Notes (Signed)
[facility]  Physician Requesting Consult:  Fransico Him, M.D. / Cardiology  Date of Consult:  10/14/2016  Reason for Consult/Chief Complaint:  Acute Hypoxic Respiratory Failure  History of Presenting Illness:  66 y.o. female, smoker, who presented to outside hospital on 9/11 with left flank pain. Imaging revealed a left ureteral stone as well as hydronephrosis. Rocephin was administered and very shortly after the patient developed a metallic taste and suffered cardiac arrest. Patient underwent resuscitation for approximately 45 minutes per cardiology documentation the patient required multiple shocks with ventricular fibrillation as the underlying rhythm problem. Patient reportedly was in complete heart block with a wide complex rhythm that prompted patient to undergo emergent left heart catheterization. She was subsequently found to have two-vessel coronary artery disease. Initially patient was on transcutaneous pacing. Patient was placed on normothermia protocol. Patient was subsequently extubated on 9/12 and per the electronic medical record began having increased shortness of breath with desaturation and crackles around 3 AM on 9/13. Lasix was ordered. Oxygen requirement increased from 2 L/m to 5 L/m to maintain saturation greater than 90%. Patient had significant diuresis with that dose of Lasix. Notably the patient also developed worsening hyponatremia as well. She continues to have significant chest wall pain from her previous CPR.  O2 needs improved with diuresis.       Subjective:  Net neg 4.8L / UOP 3800 in last 24 hours.  Converted to SR late yesterday afternoon.  No acute events. O2 at 1L.  TCTS recommending PCI for LAD.   Temp:  [97.7 F (36.5 C)-98.5 F (36.9 C)] 97.7 F (36.5 C) (09/18 0831) Pulse Rate:  [85-138] 92 (09/18 0757) Resp:  [15-29] 18 (09/18 0757) BP: (100-163)/(55-95) 140/62 (09/18 0920) SpO2:  [86 %-100 %] 100 % (09/18 0803) FiO2 (%):  [24 %] 24  % (09/18 0803) Weight:  [142 lb 3.2 oz (64.5 kg)] 142 lb 3.2 oz (64.5 kg) (09/18 0400)  General: well developed adult female in NAD HEENT: MM pink/moist, no jvd PSY: calm/appropriate  Neuro: AAOx4, speech clear, MAE  CV: s1s2 rrr, no m/r/g PULM: even/non-labored, lungs bilaterally clear anterior, diminished lower BE:MLJQ, non-tender, bsx4 active  Extremities: warm/dry, no edema  Skin: no rashes or lesions   CBC Latest Ref Rng & Units 10/18/2016 10/17/2016 10/17/2016  WBC 4.0 - 10.5 K/uL 23.6(H) - 26.5(H)  Hemoglobin 12.0 - 15.0 g/dL 9.0(L) 8.8(L) 6.8(LL)  Hematocrit 36.0 - 46.0 % 26.0(L) 26.0(L) 20.2(L)  Platelets 150 - 400 K/uL 308 - 266   BMP Latest Ref Rng & Units 10/18/2016 10/17/2016 10/16/2016  Glucose 65 - 99 mg/dL 195(H) 245(H) 286(H)  BUN 6 - 20 mg/dL 23(H) 26(H) 12  Creatinine 0.44 - 1.00 mg/dL 0.74 0.79 0.51  Sodium 135 - 145 mmol/L 130(L) 124(L) 126(L)  Potassium 3.5 - 5.1 mmol/L 4.0 4.7 4.3  Chloride 101 - 111 mmol/L 89(L) 89(L) 89(L)  CO2 22 - 32 mmol/L _0 Calcium 8.9 - 10.3 mg/dL 7.9(L) 8.0(L) 7.5(L)    Hepatic Function Latest Ref Rng & Units 10/15/2016 10/14/2016 10/13/2016  Total Protein 6.5 - 8.1 g/dL - 5.6(L) 5.3(L)  Albumin 3.5 - 5.0 g/dL 2.3(L) 2.6(L) 2.6(L)  AST 15 - 41 U/L - 87(H) 133(H)  ALT 14 - 54 U/L - 55(H) 64(H)  Alk Phosphatase 38 - 126 U/L - 75 71  Total Bilirubin 0.3 - 1.2 mg/dL - 1.3(H) 0.9    IMAGING/STUDIES: CT RENAL/STONE STUDY 9/11: IMPRESSION: 1. 4 mm distal left ureteral calculus  with mild hydroureteronephrosis. 2. Punctate nonobstructing bilateral renal calculi. 3. Small hiatal hernia. 4. Aortic Atherosclerosis (ICD10-I70.0). Mild infrarenal aortic ectasia and suspected short segment aortic dissection. LHC 9/11: 1. Severe 2 vessel coronary artery disease, including heavily calcified 95% proximal/mid LCx and sequential 90-99% mid RCA stenoses. 2. Mild to moderate, nonobstructive disease involving the LMCA and LAD. 3. Overall  preserved LV contraction with basal inferior hypokinesis. 4. Upper normal to mildly elevated left ventricular filling pressure (LVEDP 15-20 mmHg). 5. Peripheral vascular disease with ectasia and calcification of the infrarenal abdominal aorta and iliac arteries. TTE 9/12:  LV normal in size with EF 55-60% & normal regional wall motion grossly but unable to exclude hypokinesis of inferior wall. Normal diastolic function. LA & RA normal in size. RV normal in size and function. Aortic valve poorly visualized but without obvious stenosis or regurgitation. Aortic root normal in size. No mitral stenosis or regurgitation. No pulmonic stenosis. Mild tricuspid regurgitation. No pericardial effusion. RENAL U/S 9/14: Mild left hydronephrosis, improved from CT 3 days ago.  PORT CXR 9/14: Endotracheal tube removed. Patchy bilateral alveolar and interstitial opacities with some suggestion of consolidation on the left. These findings are worse on the left compared with the right. Blunting of right costophrenic angle with meniscus suggestive of pleural effusion. Spirometry 9/14:  FVC 0.95 L (31%) FEV1 0.79 L (34%) FEV1/FVC 0.84,  FEF 25-75 0.86 L (41%) CT RIGHT FEMUR 9/16:  Expansion & high density in the R hip adductor musculature, suspected hematoma volume 420 cubic cm, edema surrounding the fascia planes & in the SQ tissues of the R thigh, no hematoma observed along the common femoral vasculature (raises concern that the hematoma is spontaneous), degenerative arthropathy of both hips, small bilateral knee joint effusions  MICROBIOLOGY: MRSA PCR 9/11:  Negative Urine Culture 9/11:  E coli & Klebisella pneumoniae (both sensitive to Cipro)   ANTIBIOTICS: Rocephin 9/11 (possible anaphylaxis after 1 dose) Cipro 9/11 >> 9/17  LINES/TUBES: OETT 9/11 - 9/12 R FEM CVL 9/11 >>> 9/11 Foley >>>  SIGNIFICANT EVENTS: 09/11 - Admit to [facility] after cardiac arrest >> started normothermia protocol & underwent LHC. 09/12 -  Extubated 09/13 - Increased SOB & hypoxia (2L >> 5L) around 3am>>Lasix given. Tx from [facility] to [facility]. 09/14 - PCCM consult for hypoxia & possible multi-focal pneumonia. Additional lasix given overnight. 09/17 - spontaneous hematoma in R thigh, AFwRVR   ASSESSMENT/PLAN:  66 y.o. female admitted with urinary tract infection and left nephrolithiasis with some element of hydronephrosis. Patient suffered ventricular fibrillation cardiac arrest after Rocephin in the emergency department. Neurologically the patient seems to have recovered / intact.  She is currently being evaluated for treatment of her underlying coronary artery disease. Suspect her hypoxia is more related to atelectasis, volume overload, and some element of lung injury from her resuscitation efforts. This has improved significantly with diuresis.  Concern her hyponatremia is secondary to low intracellular potassium further depleted by IV Lasix resulting in subsequent electrolyte shifts and hyponatremia.  In addition, she has been hyperglycemic, poor PO intake and has resolving AKI.   Acute hypoxic respiratory failure; COPD exacerbation; Aspiration pneumonia vs Atelectasis;Tobacco abuse - likely multifactorial in etiology with post arrest atelectasis, underlying undiagnosed COPD and edema vs PNA.  Improving.  P: Pulmonary hygiene - IS, flutter, mobilize Wean O2 for sats 88-95% given smoking hx  Assess ambulatory O2 needs prior to d/c  Xopenex + Atrovent  Prednisone 20 mg QD  Nasal saline for comfort  Smoking cessation counseling  Intermittent CXR Repeat lasix    AF with RVR P: Per Cardiology  Continue home cardizem > to transition off gtt today Avoid beta blocker with bronchospasm if able  TCTS evaluation > recommends PCI for LAD  Hyponatremia - thought due to combination of poor PO intake and fluid overload. Resolved AKI Hypophosphatemia P: Trend BMP / urinary output Replace electrolytes as indicated Avoid nephrotoxic  agents, ensure adequate renal perfusion    Anemia; Thigh Hematoma - multifactorial secondary to critical illness, acute R thigh hematoma s/p 1 unit PRBC 9/18 Right Thigh Hematoma - appears to be spontaneous bleed, not from femoral  P: Trend CBC  Monitor right thigh, no acute interventions needed at this time  SCD's  Resume heparin SQ for DVT    E-Coli & Klebsiella Pneumonia UTI - sensitive to Cipro  P: Monitor off abx    DM P:  Monitor glucose  Lantus 25 units QD SSI resistant scale  Noe Gens, NP-C [facility] Pgr: [phone] or if no answer [phone] 10/18/2016, 9:22 AM   Attending Addendum: I personally examined this patient and agree with plan as detailed above. Briefly this is a 62yoF with COPD and Afib, who ws given Ceftriaxone for a UTI and developed anaphylaxis leading to cardiac arrest. Now with improving hypoxia (98% on 1L O2); can d/c supplemental O2 now. Afib with RVR yesterday, now back in NSR after resuming diltiazem. Thigh hematoma and Anemia; Hgb stable today from yesterday monitoring. Hold heparin but if stable in another day or so consider restarting it. Getting diuresed, but I'm not sure that she's volume overloaded. CXR clear. Lungs CTA on exam; no pedal edema. BNP only 92 on 10/15/16. Bicarb slowly increasing from 20 on admission now up to 30, likely a contraction metabolic alkylosis. Will stop lasix now. Continues to have hyponatremia; will check urine and serum Osm today. Hypokalemia and Hypophosphatemia will replete. Hyperglycemia continues to be a problem, likely compounded by the prednisone. Lungs CTA b/l on my exam today so will stop the prednisone. Continue Lantus and SSI; if remains hyperglycemic may need to increase lantus dose. Patient is stable for transfer out of ICU to Telemetry at this time.   30 minutes spent with this patient; S3 visit.    Vernie Murders, MD Pulmonary & Critical Care

## 2016-10-19 ENCOUNTER — Inpatient Hospital Stay (HOSPITAL_COMMUNITY): Payer: Managed Care, Other (non HMO)

## 2016-10-19 LAB — CBC
HCT: 29.5 % — ABNORMAL LOW (ref 36.0–46.0)
Hemoglobin: 10.1 g/dL — ABNORMAL LOW (ref 12.0–15.0)
MCH: 28.5 pg (ref 26.0–34.0)
MCHC: 34.2 g/dL (ref 30.0–36.0)
MCV: 83.3 fL (ref 78.0–100.0)
PLATELETS: 405 10*3/uL — AB (ref 150–400)
RBC: 3.54 MIL/uL — ABNORMAL LOW (ref 3.87–5.11)
RDW: 14.4 % (ref 11.5–15.5)
WBC: 27.9 10*3/uL — AB (ref 4.0–10.5)

## 2016-10-19 LAB — GLUCOSE, CAPILLARY
Glucose-Capillary: 383 mg/dL — ABNORMAL HIGH (ref 65–99)
Glucose-Capillary: 155 mg/dL — ABNORMAL HIGH (ref 65–99)

## 2016-10-19 LAB — MAGNESIUM: Magnesium: 1.6 mg/dL — ABNORMAL LOW (ref 1.7–2.4)

## 2016-10-19 MED ORDER — IPRATROPIUM BROMIDE 0.02 % IN SOLN: 0.5000 mg | RESPIRATORY_TRACT | Status: DC | PRN

## 2016-10-19 MED ORDER — LEVALBUTEROL HCL 0.63 MG/3ML IN NEBU: 0.6300 mg | INHALATION_SOLUTION | RESPIRATORY_TRACT | Status: DC | PRN

## 2016-10-19 NOTE — Evaluation (Signed)
Occupational Therapy Evaluation Patient Details Name: Lisa [name] MRN: [id number] DOB: [birth year] Today's Date: 10/19/2016    History of Present Illness 66 y.o. female admitted with urinary tract infection and left nephrolithiasis with some element of hydronephrosis. Patient suffered ventricular fibrillation cardiac arrest after Rocephin in the emergency department. Neurologically the patient seems to have recovered / intact.  She is currently being evaluated for treatment of her underlying coronary artery disease. Suspect her hypoxia is more related to atelectasis, volume overload, and some element of lung injury from her resuscitation efforts. CABG on hold for now due to right groin hemtoma   Clinical Impression   Pt demonstrating progress toward OT goals this session. She was able to tolerate ambulation in room for toilet transfers and toileting hygiene tasks with overall min assist this session. Noted poor signal for SpO2 monitor throughout session. Pt did demonstrate brief O2 desaturation on RA to upper 70's-low 80's with quick rebound to upper 90's with poor wave form on monitor. SpO2 96% at rest on RA at end of session. HR up to 121 with mobility. She continues to demonstrate generalized weakness, pain with coughing, and decreased activity tolerance limited her ability to participate in ADL at [facility]. Discussed D/C recommendations with pt and family and they report strong preference to return home rather than post-acute rehabilitation while recovering prior to upcoming procedure. Feel pt will need 24 hour hands-on assistance and home health OT services post-acute D/C to maximize functional gains post-acute D/C if not pursuing post-acute rehabilitation.    Follow Up Recommendations  Supervision/Assistance - 24 hour;CIR    Equipment Recommendations  3 in 1 bedside commode    Recommendations for Other Services       Precautions / Restrictions Precautions Precautions: Fall Precaution  Comments: 10/17/16 went into A-fib with RVR during OT session with HR as high as 172 (was in 120's when up on her feet and as soon as she sat down was when issues started) Restrictions Weight Bearing Restrictions: No      Mobility Bed Mobility Overal bed mobility: Needs Assistance Bed Mobility: Supine to Sit;Sit to Supine     Supine to sit: Min guard Sit to supine: Min assist   General bed mobility comments: Needed assist for LEs back to bed.   Transfers Overall transfer level: Needs assistance Equipment used: Rolling walker (2 wheeled) Transfers: Sit to/from Stand Sit to Stand: Min assist;Mod assist;+2 physical assistance         General transfer comment: Lifting and steadying assist.     Balance Overall balance assessment: Needs assistance Sitting-balance support: No upper extremity supported;Feet supported Sitting balance-Leahy Scale: Fair     Standing balance support: Bilateral upper extremity supported;During functional activity Standing balance-Leahy Scale: Poor Standing balance comment: reliant on Bil UE support on RW                           ADL either performed or assessed with clinical judgement   ADL Overall ADL's : Needs assistance/impaired Eating/Feeding: Independent;Sitting                       Toilet Transfer: Minimal assistance;Ambulation;BSC Toilet Transfer Details (indicate cue type and reason): Ambulating in room prior to use of BSC.  Toileting- Clothing Manipulation and Hygiene: Minimal assistance;Sit to/from stand       Functional mobility during ADLs: Minimal assistance;Rolling walker General ADL Comments: Pt with improving activity tolerance for ADL. She does  move slowly and fatigue easily.      Vision         Perception     Praxis      Pertinent Vitals/Pain Pain Assessment: Faces Faces Pain Scale: Hurts even more Pain Location: Chest wall pain, especially with coughing Pain Descriptors / Indicators:  Aching Pain Intervention(s): Monitored during session;Repositioned;Limited activity within patient's tolerance     Hand Dominance     Extremity/Trunk Assessment             Communication     Cognition Arousal/Alertness: Awake/alert Behavior During Therapy: WFL for tasks assessed/performed Overall Cognitive Status: Within Functional Limits for tasks assessed                                     General Comments  Pt on RW throughout session. Poor signal on SpO2 for parts of session. Did demonstrate desaturation to high 70s-low 80's with rapid rebound to upper 90s and unsure of reliability of reading. Provided consistent cues for breathing techniques.     Exercises     Shoulder Instructions      Home Living                                          Prior Functioning/Environment                   OT Problem List:        OT Treatment/Interventions:      OT Goals(Current goals can be found in the care plan section) Acute Rehab OT Goals Patient Stated Goal: wants to go home  OT Goal Formulation: With patient/family Time For Goal Achievement: 10/31/16 Potential to Achieve Goals: Good  OT Frequency: Min 3X/week   Barriers to D/C:            Co-evaluation              AM-PAC PT "6 Clicks" Daily Activity     Outcome Measure Help from another person eating meals?: None Help from another person taking care of personal grooming?: A Little Help from another person toileting, which includes using toliet, bedpan, or urinal?: A Lot Help from another person bathing (including washing, rinsing, drying)?: A Lot Help from another person to put on and taking off regular upper body clothing?: A Little Help from another person to put on and taking off regular lower body clothing?: A Lot 6 Click Score: 16   End of Session Equipment Utilized During Treatment: Rolling walker;Gait belt Nurse Communication:  (RN cleared pt for participation  in session)  Activity Tolerance: Patient tolerated treatment well Patient left: in bed;with call bell/phone within reach;with family/visitor present  OT Visit Diagnosis: Unsteadiness on feet (R26.81);Pain Pain - part of body:  (chest with coughing)                Time: 5170-0174 OT Time Calculation (min): 22 min Charges:  OT General Charges $OT Visit: 1 Visit OT Treatments $Self Care/Home Management : 8-22 mins G-Codes:     Norman Herrlich, MS OTR/L  Pager: [place] A Abagael Kramm 10/19/2016, 5:06 PM

## 2016-10-19 NOTE — Progress Notes (Addendum)
Progress Note  Patient Name: Lisa [name] Date of Encounter: 10/19/2016  Primary Cardiologist: End  Subjective   On 9/17 had AFIB with RVR, converted with IV diltiazem. Back again with AFIB 9/18 late. No CP.   Inpatient Medications    Scheduled Meds: . ALPRAZolam  0.25 mg Oral Daily  . aspirin EC  81 mg Oral Daily  . atorvastatin  80 mg Oral q1800  . diltiazem  240 mg Oral Daily  . feeding supplement (GLUCERNA SHAKE)  237 mL Oral TID WC  . guaiFENesin  600 mg Oral BID  . heparin subcutaneous  5,000 Units Subcutaneous Q8H  . insulin aspart  0-20 Units Subcutaneous TID WC  . insulin aspart  6 Units Subcutaneous TID WC  . insulin glargine  25 Units Subcutaneous Daily  . mouth rinse  15 mL Mouth Rinse BID   Continuous Infusions: . diltiazem (CARDIZEM) infusion 10 mg/hr (10/19/16 0227)   PRN Meds: acetaminophen, ALPRAZolam, benzonatate, ipratropium, levalbuterol, morphine injection, nitroGLYCERIN, ondansetron (ZOFRAN) IV, oxyCODONE-acetaminophen, sodium chloride   Vital Signs    Vitals:   10/19/16 0435 10/19/16 0437 10/19/16 0500 10/19/16 0820  BP:  (!) 116/57    Pulse:  (!) 130    Resp:  (!) 24    Temp: 97.8 F (36.6 C)     TempSrc: Oral     SpO2:  93%  96%  Weight:   136 lb 14.4 oz (62.1 kg)   Height:        Intake/Output Summary (Last 24 hours) at 10/19/16 1040 Last data filed at 10/19/16 0835  Gross per 24 hour  Intake             1260 ml  Output             4500 ml  Net            -3240 ml   Filed Weights   10/17/16 0600 10/18/16 0400 10/19/16 0500  Weight: 142 lb 3.2 oz (64.5 kg) 142 lb 3.2 oz (64.5 kg) 136 lb 14.4 oz (62.1 kg)    Telemetry     Now NSR. AFIB with RVR as on 9/17 again seen on 9/19- Personally Reviewed  ECG    10/12/16-sinus rhythm with no significant ST segment changes - Personally Reviewed  Physical Exam   GEN: Well nourished, well developed, in no acute distress Foley HEENT: normal  Neck: no JVD, carotid bruits, or  masses Cardiac:RRR; no murmurs, rubs, or gallops,no edema  Respiratory:  clear to auscultation bilaterally, normal work of breathing GI: soft, nontender, nondistended, + BS MS: no deformity or atrophy  Skin: warm and dry, no rash, soft hematoma right groin.  Neuro:  Alert and Oriented x 3, Strength and sensation are intact Psych: euthymic mood, full affect   Labs    Chemistry Recent Labs Lab 10/13/16 0452  10/14/16 0442  10/15/16 0234  10/17/16 0930 10/18/16 0240 10/18/16 1102  NA 125*  < > 121*  < > 122*  < > 124* 130* 127*  K 3.7  < > 3.0*  < > 3.8  < > 4.7 4.0 3.5  CL 96*  < > 86*  < > 88*  < > 89* 89* 86*  CO2 21*  < > 22  < > 27  < > 28 29 30   GLUCOSE 252*  < > 245*  < > 196*  < > 245* 195* 331*  BUN 20  < > 12  < > 11  < >  26* 23* 21*  CREATININE 0.80  < > 0.84  < > 0.61  < > 0.79 0.74 0.77  CALCIUM 6.6*  < > 6.6*  < > 6.9*  < > 8.0* 7.9* 8.3*  PROT 5.3*  --  5.6*  --   --   --   --   --   --   ALBUMIN 2.6*  --  2.6*  --  2.3*  --   --   --  2.6*  AST 133*  --  87*  --   --   --   --   --   --   ALT 64*  --  55*  --   --   --   --   --   --   ALKPHOS 71  --  75  --   --   --   --   --   --   BILITOT 0.9  --  1.3*  --   --   --   --   --   --   GFRNONAA >60  < > >60  < > >60  < > >60 >60 >60  GFRAA >60  < > >60  < > >60  < > >60 >60 >60  ANIONGAP 8  < > 13  < > 7  < > 7 12 11   < > = values in this interval not displayed.   Hematology  Recent Labs Lab 10/17/16 0224 10/17/16 0817 10/18/16 0240 10/19/16 0322  WBC 26.5*  --  23.6* 27.9*  RBC 2.52*  --  3.14* 3.54*  HGB 6.8* 8.8* 9.0* 10.1*  HCT 20.2* 26.0* 26.0* 29.5*  MCV 80.2  --  82.8 83.3  MCH 27.0  --  28.7 28.5  MCHC 33.7  --  34.6 34.2  RDW 13.8  --  14.4 14.4  PLT 266  --  308 405*    Cardiac Enzymes No results for input(s): TROPONINI in the last 168 hours. No results for input(s): TROPIPOC in the last 168 hours.   BNP  Recent Labs Lab 10/13/16 1217 10/14/16 0443 10/15/16 0234  BNP 217.2*  129.5* 92.9     DDimer No results for input(s): DDIMER in the last 168 hours.   Radiology    Dg Chest Port 1 View  Result Date: 10/19/2016 CLINICAL DATA:  Acute respiratory failure, hypoxia EXAM: PORTABLE CHEST 1 VIEW COMPARISON:  10/18/2016 FINDINGS: Right base atelectasis with mildly elevated right hemidiaphragm. Cardiomegaly. Left lung is clear. No effusions or acute bony abnormality. IMPRESSION: Right base atelectasis.  Mild cardiomegaly. Electronically Signed   By: Rolm Baptise M.D.   On: 10/19/2016 08:42   Dg Chest Port 1 View  Result Date: 10/18/2016 CLINICAL DATA:  Acute respiratory failure EXAM: PORTABLE CHEST 1 VIEW COMPARISON:  10/17/2016 FINDINGS: Cardiac shadow is mildly enlarged but stable. Aortic calcifications are again seen. Lungs are well-aerated with right basilar atelectatic changes. Some mild interstitial changes are seen particularly on the left but stable. No new focal abnormality is seen. IMPRESSION: No significant interval change from the prior exam. Electronically Signed   By: Inez Catalina M.D.   On: 10/18/2016 07:56    Cardiac Studies   ECHO 10/12/16: Study Conclusions  - Left ventricle: The cavity size was normal. Systolic function was   normal. The estimated ejection fraction was in the range of 55%   to 60%. Wall motion was grossly normal; Unable to exclude mild   hypokinesis of the inferior wall.  Left ventricular diastolic   function parameters were normal. - Left atrium: The atrium was normal in size. - Right ventricle: Systolic function was normal. - Pulmonary arteries: Systolic pressure was within the normal   range.  Cardiac catheterization 10/11/16 Conclusions: 1. Severe 2 vessel coronary artery disease, including heavily calcified 95% proximal/mid LCx and sequential 90-99% mid RCA stenoses. 2. Mild to moderate, nonobstructive disease involving the LMCA and LAD. 3. Overall preserved LV contraction with basal inferior hypokinesis. 4. Upper normal  to mildly elevated left ventricular filling pressure (LVEDP 15-20 mmHg). 5. Peripheral vascular disease with ectasia and calcification of the infrarenal abdominal aorta and iliac arteries.  Recommendations: 1. I suspect patient's cardiac arrest was due to demand ischemia in the setting of anaphylactic shock from allergic reaction to ceftriaxone. She has TIMI-3 flow in all major epicardial coronary arteries. I recommend medical therapy and treatment of the underlying infection. If she has a meaningful neurologic recovery, atherectomy/PCI to the RCA and LCx will need to be considered. ADDENDUM: Images reviewd on 10/12/16 at interventional conference. Given history of DM, severe calcification of lesions, and eccentricity of mid RCA stenosis, cardiac surgery consultation for CABG should be obtained. If Ms. Olivos is deemed a poor surgical candidate, high-risk PCI with atherectomy could be considered. 2. Obtain transthoracic echocardiogram to confirm LVEF and evaluate for other structural abnormalities. 3. I would like to avoid intra-aortic balloon pump placement if possible, given aortic disease. If hypotension becomes a problem, judicious vasopressor support is recommended. 4. Low-dose aspirin and high intensity statin therapy. Consider heparin infusion (ACS nomogram) if no invasive procedures are necessary (i.e. percutaneous nephrostomy tube). 5. Consider addition of low-dose beta blocker if heart rate and blood pressure allow. If significant ventricular ectopy is observed, amiodarone infusion should be considered.  Diagnostic Diagram       Nelva Bush, MD  Patient Profile     66 y.o. female with V. Fib arrest on 9/11 in the emergency department after getting ceftriaxone IV who underwent cardiac catheterization showing 95% proximal mid LAD and 99% sequential mid RCA with prior history of COPD. Had PAF on 9/17 and again on 9/19. Slow progression.   Assessment & Plan    V. Fib arrest/Cardiac  arrest  - No further VFIB  - in review of Dr. Olin Pia note, arrest was felt to be secondary to demand ischemia in the setting of anaphylactic shock from ceftriaxone. She was placed on normothermia protocol, pressors,now extubated.  Severe coronary artery disease  - Circumflex and RCA, mild to moderate left main. EF preserved. Reviewed Dr. Darcey Nora note. Given her current underlying conditions, not felt to be a surgical candidate.  He recommends PCI. Discussed with Dr. Ellyn Hack and Dr. Martinique of interventional team to help formulate a plan. Dr. Saunders Revel is in the lab the next 2 days and may be able the one to perform PCI with atherectomy. Continuing to optimize.  Paroxysmal atrial fibrillation  - on 10/17/16 and again on 9/19  - converted after IV diltiazem previously. Will try again  - avoiding anticoagulation because of recent hematoma and PCI upcoming.   Groin hematoma  - Confirmed by CT scan on 9/16. Perhaps spontaneous bleed per report.   - Hemoglobin dropped from 13 down to 6.8  - No compartment syndrome.  - IV heparin has been stopped.  - Dr. Wynonia Lawman ordered 1 unit transfusion on 10/17/16 at 3 AM after hemoglobin of 6.8. Responded well.   COPD  - Pulmonary following.   - Finished steroids,  pulmonary toilet  - poor mechanics.   Hyponatremia  - Slowly improving from 122-126-130-127. Appreciate CCM input  Nephrolithiasis/UTI  - has been treated with Cipro for Klebsiella Escherichia coli  - her initial presentation was left flank plain.  - Will DC foley   Diabetes  - Hemoglobin A1c  7  - per IM/ CCM team   For questions or updates, please contact [place] Please consult [email or web address] for contact info under Cardiology/STEMI.      Signed, Candee Furbish, MD  10/19/2016, 10:40 AM

## 2016-10-19 NOTE — Progress Notes (Signed)
Inpatient Diabetes Program Recommendations  AACE/ADA: New Consensus Statement on Inpatient Glycemic Control (2015)  Target Ranges:  Prepandial:   less than 140 mg/dL      Peak postprandial:   less than 180 mg/dL (1-2 hours)      Critically ill patients:  140 - 180 mg/dL   Lab Results  Component Value Date   GLUCAP 279 (H) 10/18/2016   HGBA1C 7.0 (H) 10/15/2016    Review of Glycemic Control  Diabetes history: DM 1 Outpatient Diabetes medications: insulin pump Current orders for Inpatient glycemic control: Lantus 25 units, Novolog Resistant 0-20 units tid + Novolog 6 units tid meal coverage  Inpatient Diabetes Program Recommendations:    PO prednisone given yesterday not ordered for today. Glucose trends in the 200-300's. Trends should trend downward. Will watch on current regimen today.   Thanks,  Tama Headings RN, MSN, [facility] Inpatient Diabetes Coordinator Team Pager [phone] (8a-5p)

## 2016-10-20 LAB — CBC
HEMATOCRIT: 30.9 % — AB (ref 36.0–46.0)
HEMOGLOBIN: 10.4 g/dL — AB (ref 12.0–15.0)
MCH: 28.7 pg (ref 26.0–34.0)
MCHC: 33.7 g/dL (ref 30.0–36.0)
MCV: 85.1 fL (ref 78.0–100.0)
Platelets: 476 10*3/uL — ABNORMAL HIGH (ref 150–400)
RBC: 3.63 MIL/uL — ABNORMAL LOW (ref 3.87–5.11)
RDW: 15 % (ref 11.5–15.5)
WBC: 24.6 10*3/uL — AB (ref 4.0–10.5)

## 2016-10-20 LAB — GLUCOSE, CAPILLARY
GLUCOSE-CAPILLARY: 152 mg/dL — AB (ref 65–99)
GLUCOSE-CAPILLARY: 75 mg/dL (ref 65–99)
Glucose-Capillary: 203 mg/dL — ABNORMAL HIGH (ref 65–99)

## 2016-10-20 MED ORDER — DILTIAZEM HCL ER COATED BEADS 120 MG PO CP24
120.0000 mg | ORAL_CAPSULE | Freq: Once | ORAL | Status: AC
  Administered 2016-10-20: 120 mg via ORAL
  Filled 2016-10-20: qty 1

## 2016-10-20 MED ORDER — DILTIAZEM HCL ER COATED BEADS 180 MG PO CP24
360.0000 mg | ORAL_CAPSULE | Freq: Every day | ORAL | Status: DC
  Administered 2016-10-21: 360 mg via ORAL
  Filled 2016-10-20: qty 2

## 2016-10-20 MED ORDER — DILTIAZEM HCL 25 MG/5ML IV SOLN
10.0000 mg | INTRAVENOUS | Status: DC | PRN
  Administered 2016-10-20: 10 mg via INTRAVENOUS
  Filled 2016-10-20 (×2): qty 5

## 2016-10-20 NOTE — Progress Notes (Signed)
Patient converted ot atrial Fibrillation this am at 0235. Converted back to NSR at 0652 after receiving Cardizem IV push. Currently ST, heart rate 109

## 2016-10-20 NOTE — Progress Notes (Signed)
Progress Note  Patient Name: Lisa [name] Date of Encounter: 10/20/2016  Primary Cardiologist: End  Subjective   She once again had a short burst of atrial fibrillation. Responded to IV diltiazem push. We have increased her by mouth diltiazem.  Inpatient Medications    Scheduled Meds: . ALPRAZolam  0.25 mg Oral Daily  . aspirin EC  81 mg Oral Daily  . atorvastatin  80 mg Oral q1800  . diltiazem  120 mg Oral Once  . [START ON 10/21/2016] diltiazem  360 mg Oral Daily  . feeding supplement (GLUCERNA SHAKE)  237 mL Oral TID WC  . guaiFENesin  600 mg Oral BID  . heparin subcutaneous  5,000 Units Subcutaneous Q8H  . insulin aspart  0-20 Units Subcutaneous TID WC  . insulin aspart  6 Units Subcutaneous TID WC  . insulin glargine  25 Units Subcutaneous Daily  . mouth rinse  15 mL Mouth Rinse BID   Continuous Infusions:  PRN Meds: acetaminophen, ALPRAZolam, benzonatate, diltiazem, ipratropium, levalbuterol, morphine injection, nitroGLYCERIN, ondansetron (ZOFRAN) IV, oxyCODONE-acetaminophen, sodium chloride   Vital Signs    Vitals:   10/19/16 0800 10/19/16 0820 10/20/16 0229 10/20/16 0815  BP: 130/68   126/71  Pulse: 92     Resp: 18     Temp: 98.7 F (37.1 C)   97.7 F (36.5 C)  TempSrc: Oral   Oral  SpO2:  96%    Weight:   136 lb 14.5 oz (62.1 kg)   Height:        Intake/Output Summary (Last 24 hours) at 10/20/16 1004 Last data filed at 10/19/16 1700  Gross per 24 hour  Intake              480 ml  Output                0 ml  Net              480 ml   Filed Weights   10/18/16 0400 10/19/16 0500 10/20/16 0229  Weight: 142 lb 3.2 oz (64.5 kg) 136 lb 14.4 oz (62.1 kg) 136 lb 14.5 oz (62.1 kg)    Telemetry     Now NSR. AFIB with RVR Seen once again- Personally Reviewed  ECG    10/12/16-sinus rhythm with no significant ST segment changes - Personally Reviewed  Physical Exam   GEN: Well nourished, well developed, in no acute distress  HEENT: normal  Neck: no  JVD, carotid bruits, or masses Cardiac: RRR; no murmurs, rubs, or gallops,no edema  Respiratory:  clear to auscultation bilaterally, normal work of breathing GI: soft, nontender, nondistended, + BS MS: no deformity or atrophy hematoma resolving Skin: warm and dry, no rash Neuro:  Alert and Oriented x 3, Strength and sensation are intact Psych: euthymic mood, full affect    Labs    Chemistry Recent Labs Lab 10/14/16 0442  10/15/16 0234  10/17/16 0930 10/18/16 0240 10/18/16 1102  NA 121*  < > 122*  < > 124* 130* 127*  K 3.0*  < > 3.8  < > 4.7 4.0 3.5  CL 86*  < > 88*  < > 89* 89* 86*  CO2 22  < > 27  < > 28 29 30   GLUCOSE 245*  < > 196*  < > 245* 195* 331*  BUN 12  < > 11  < > 26* 23* 21*  CREATININE 0.84  < > 0.61  < > 0.79 0.74 0.77  CALCIUM 6.6*  < >  6.9*  < > 8.0* 7.9* 8.3*  PROT 5.6*  --   --   --   --   --   --   ALBUMIN 2.6*  --  2.3*  --   --   --  2.6*  AST 87*  --   --   --   --   --   --   ALT 55*  --   --   --   --   --   --   ALKPHOS 75  --   --   --   --   --   --   BILITOT 1.3*  --   --   --   --   --   --   GFRNONAA >60  < > >60  < > >60 >60 >60  GFRAA >60  < > >60  < > >60 >60 >60  ANIONGAP 13  < > 7  < > 7 12 11   < > = values in this interval not displayed.   Hematology  Recent Labs Lab 10/18/16 0240 10/19/16 0322 10/20/16 0415  WBC 23.6* 27.9* 24.6*  RBC 3.14* 3.54* 3.63*  HGB 9.0* 10.1* 10.4*  HCT 26.0* 29.5* 30.9*  MCV 82.8 83.3 85.1  MCH 28.7 28.5 28.7  MCHC 34.6 34.2 33.7  RDW 14.4 14.4 15.0  PLT 308 405* 476*    Cardiac Enzymes No results for input(s): TROPONINI in the last 168 hours. No results for input(s): TROPIPOC in the last 168 hours.   BNP  Recent Labs Lab 10/13/16 1217 10/14/16 0443 10/15/16 0234  BNP 217.2* 129.5* 92.9     DDimer No results for input(s): DDIMER in the last 168 hours.   Radiology    Dg Chest Port 1 View  Result Date: 10/19/2016 CLINICAL DATA:  Acute respiratory failure, hypoxia EXAM: PORTABLE  CHEST 1 VIEW COMPARISON:  10/18/2016 FINDINGS: Right base atelectasis with mildly elevated right hemidiaphragm. Cardiomegaly. Left lung is clear. No effusions or acute bony abnormality. IMPRESSION: Right base atelectasis.  Mild cardiomegaly. Electronically Signed   By: Rolm Baptise M.D.   On: 10/19/2016 08:42    Cardiac Studies   ECHO 10/12/16: Study Conclusions  - Left ventricle: The cavity size was normal. Systolic function was   normal. The estimated ejection fraction was in the range of 55%   to 60%. Wall motion was grossly normal; Unable to exclude mild   hypokinesis of the inferior wall. Left ventricular diastolic   function parameters were normal. - Left atrium: The atrium was normal in size. - Right ventricle: Systolic function was normal. - Pulmonary arteries: Systolic pressure was within the normal   range.  Cardiac catheterization 10/11/16 Conclusions: 1. Severe 2 vessel coronary artery disease, including heavily calcified 95% proximal/mid LCx and sequential 90-99% mid RCA stenoses. 2. Mild to moderate, nonobstructive disease involving the LMCA and LAD. 3. Overall preserved LV contraction with basal inferior hypokinesis. 4. Upper normal to mildly elevated left ventricular filling pressure (LVEDP 15-20 mmHg). 5. Peripheral vascular disease with ectasia and calcification of the infrarenal abdominal aorta and iliac arteries.  Recommendations: 1. I suspect patient's cardiac arrest was due to demand ischemia in the setting of anaphylactic shock from allergic reaction to ceftriaxone. She has TIMI-3 flow in all major epicardial coronary arteries. I recommend medical therapy and treatment of the underlying infection. If she has a meaningful neurologic recovery, atherectomy/PCI to the RCA and LCx will need to be considered. ADDENDUM: Images reviewd on 10/12/16 at  interventional conference. Given history of DM, severe calcification of lesions, and eccentricity of mid RCA stenosis, cardiac  surgery consultation for CABG should be obtained. If Ms. Manthei is deemed a poor surgical candidate, high-risk PCI with atherectomy could be considered. 2. Obtain transthoracic echocardiogram to confirm LVEF and evaluate for other structural abnormalities. 3. I would like to avoid intra-aortic balloon pump placement if possible, given aortic disease. If hypotension becomes a problem, judicious vasopressor support is recommended. 4. Low-dose aspirin and high intensity statin therapy. Consider heparin infusion (ACS nomogram) if no invasive procedures are necessary (i.e. percutaneous nephrostomy tube). 5. Consider addition of low-dose beta blocker if heart rate and blood pressure allow. If significant ventricular ectopy is observed, amiodarone infusion should be considered.  Diagnostic Diagram       Nelva Bush, MD  Patient Profile     66 y.o. female with V. Fib arrest on 9/11 in the emergency department after getting ceftriaxone IV who underwent cardiac catheterization showing 95% proximal mid LAD and 99% sequential mid RCA with prior history of COPD. Had PAF on 9/17 and again on 9/19And 9/20. Slow progression.   Assessment & Plan    V. Fib arrest/Cardiac arrest  - No further VFIB  - in review of Dr. Olin Pia note, arrest was felt to be secondary to demand ischemia in the setting of anaphylactic shock from ceftriaxone. She was placed on normothermia protocol, pressors,now extubated. He did not state that she needed a defibrillator.  Severe coronary artery disease  - Circumflex and RCA, mild to moderate left main. EF preserved. Reviewed Dr. Darcey Nora note. Given her current underlying conditions, not felt to be a surgical candidate.  He recommends PCI. Discussed with Dr. Ellyn Hack and Dr. Martinique of interventional team to help formulate a plan. After lengthy discussion, we have decided to postpone attempts at atherectomy and percutaneous intervention because of her recent spontaneous hematoma  that developed in the setting of anticoagulation. She would require high-dose anticoagulation during procedure and double antiplatelet therapy and there is concern that this may exacerbate further bleeding at this time. Plan will be to discuss atherectomy in the next 2 weeks in clinic and decide on appropriate timing. Of course, if she has excessive anginal symptoms or hand may be forced to proceed. Currently she is not complaining of any angina. She and her family are aware. Continuing to optimize.  Paroxysmal atrial fibrillation  - on 10/17/16 and again on 9/19 and on 9/20-responded well to IV diltiazem 10 mg push.  - I will increase her by mouth diltiazem from 240-360 daily. I will give her an additional 120 mg now. She seems to be responding very well to diltiazem so I will continue this in place of metoprolol.  - avoiding anticoagulation because of recent spontaneous hematoma. Discussed potential stroke risk.  Groin hematoma  - Confirmed by CT scan on 9/16. Perhaps spontaneous bleed per report.   - Hemoglobin dropped from 13 down to 6.8 but responded well to 1 unit of transfusion. Hemoglobin stable at 10.  - Dr. Wynonia Lawman ordered 1 unit transfusion on 10/17/16 at 3 AM after hemoglobin of 6.8. Responded well.   COPD  - Pulmonary following.   - Finished steroids, pulmonary toilet. White blood cell count should be decreasing after steroid cessation.  - poor mechanics.   Hyponatremia  - Slowly improving from 122-126-130-127. Appreciate CCM input  Nephrolithiasis/UTI  - has been treated with Cipro for Klebsiella Escherichia coli  - her initial presentation was left flank plain.  -  Will DC foley   Diabetes  - Hemoglobin A1c  7  - per IM/ CCM team  -Hyperglycemia Should be improving after stopping steroids.  Disposition  - Inpatient rehabilitation consultation.  For questions or updates, please contact [place] Please consult [email or web address] for contact info under Cardiology/STEMI.        Signed, Candee Furbish, MD  10/20/2016, 10:04 AM

## 2016-10-20 NOTE — Progress Notes (Signed)
Inpatient Diabetes Program Recommendations  AACE/ADA: New Consensus Statement on Inpatient Glycemic Control (2015)  Target Ranges:  Prepandial:   less than 140 mg/dL      Peak postprandial:   less than 180 mg/dL (1-2 hours)      Critically ill patients:  140 - 180 mg/dL   Lab Results  Component Value Date   GLUCAP 203 (H) 10/20/2016   HGBA1C 7.0 (H) 10/15/2016   Review of Glycemic Control  Diabetes history: DM 1 Outpatient Diabetes medications: insulin pump Current orders for Inpatient glycemic control: Lantus 25 units, Novolog Resistant 0-20 units tid + Novolog 6 units tid meal coverage  Inpatient Diabetes Program Recommendations:    Fasting glucose 203 this am. Glucose trended into the 300's yesterday. Consider increasing Lantus to 28 units and Novolog Meal coverage to 8 units tid.  Thanks,  Tama Headings RN, MSN, [facility] Inpatient Diabetes Coordinator Team Pager [phone] (8a-5p)

## 2016-10-20 NOTE — Progress Notes (Signed)
Pt has converted back to Afib RVR. HR ranging 120-150's. MD notified. Verbal order given for Cardizem IV push. Will continue to monitor. Isac Caddy, RN

## 2016-10-20 NOTE — Progress Notes (Signed)
Physical Therapy Treatment Patient Details Name: Lisa [name] MRN: [id number] DOB: 1950-02-25 Today's Date: 10/20/2016    History of Present Illness 66 y.o. female admitted with urinary tract infection and left nephrolithiasis with some element of hydronephrosis. Patient suffered ventricular fibrillation cardiac arrest after Rocephin in the emergency department. Neurologically the patient seems to have recovered / intact.  She is currently being evaluated for treatment of her underlying coronary artery disease. Suspect her hypoxia is more related to atelectasis, volume overload, and some element of lung injury from her resuscitation efforts. CABG on hold for now due to right groin hemtoma    PT Comments    Patient is making gradual progress toward mobility goals. Pt tolerated ambulating 51ft with min A and required min A for functional transfers. SpO2 90% or > on RA throughout session and HR into upper 120s with mobility. Continue to progress as tolerated.    Follow Up Recommendations  CIR;Other (comment) (post-acute rehab)     Equipment Recommendations  Rolling walker with 5" wheels;3in1 (PT)    Recommendations for Other Services OT consult (order placed per protocol)     Precautions / Restrictions Precautions Precautions: Fall Restrictions Weight Bearing Restrictions: No    Mobility  Bed Mobility               General bed mobility comments: pt sitting EOB upon arrival  Transfers Overall transfer level: Needs assistance Equipment used: Rolling walker (2 wheeled) Transfers: Sit to/from Stand Sit to Stand: Min assist;+2 physical assistance         General transfer comment: cues for technique to maintian sternal precautions; assist to power up into standing  Ambulation/Gait Ambulation/Gait assistance: Min assist;+2 safety/equipment (chair follow) Ambulation Distance (Feet): 16 Feet Assistive device: Rolling walker (2 wheeled) Gait Pattern/deviations: Decreased  step length - right;Decreased step length - left;Decreased stride length;Shuffle;Trunk flexed;Step-through pattern Gait velocity: decreased   General Gait Details: cues for posture and bilat heel strike   Stairs            Wheelchair Mobility    Modified Rankin (Stroke Patients Only)       Balance Overall balance assessment: Needs assistance Sitting-balance support: No upper extremity supported;Feet supported Sitting balance-Leahy Scale: Fair     Standing balance support: Bilateral upper extremity supported;During functional activity Standing balance-Leahy Scale: Poor                              Cognition Arousal/Alertness: Awake/alert Behavior During Therapy: Flat affect;Anxious Overall Cognitive Status: Within Functional Limits for tasks assessed                                        Exercises      General Comments General comments (skin integrity, edema, etc.): SpO2 >90% on RA during session and HR into upper 120s with mobility      Pertinent Vitals/Pain Pain Assessment: Faces Faces Pain Scale: Hurts little more Pain Location: Chest wall pain, especially with coughing Pain Descriptors / Indicators: Guarding;Sore Pain Intervention(s): Limited activity within patient's tolerance;Monitored during session;Repositioned    Home Living                      Prior Function            PT Goals (current goals can now be found in the care plan  section) Acute Rehab PT Goals Patient Stated Goal: wants to go home  PT Goal Formulation: With patient Time For Goal Achievement: 10/30/16 Potential to Achieve Goals: Good Progress towards PT goals: Progressing toward goals    Frequency    Min 3X/week      PT Plan Current plan remains appropriate    Co-evaluation              AM-PAC PT "6 Clicks" Daily Activity  Outcome Measure  Difficulty turning over in bed (including adjusting bedclothes, sheets and  blankets)?: A Lot Difficulty moving from lying on back to sitting on the side of the bed? : Unable Difficulty sitting down on and standing up from a chair with arms (e.g., wheelchair, bedside commode, etc,.)?: Unable Help needed moving to and from a bed to chair (including a wheelchair)?: A Little Help needed walking in hospital room?: A Little Help needed climbing 3-5 steps with a railing? : A Lot 6 Click Score: 12    End of Session Equipment Utilized During Treatment: Gait belt Activity Tolerance: Patient limited by fatigue Patient left: in chair;with call bell/phone within reach;with family/visitor present Nurse Communication: Mobility status PT Visit Diagnosis: Unsteadiness on feet (R26.81);Other abnormalities of gait and mobility (R26.89) Pain - Right/Left:  (center) Pain - part of body:  (Chest)     Time: 4825-0037 PT Time Calculation (min) (ACUTE ONLY): 20 min  Charges:  $Gait Training: 8-22 mins                    G Codes:       Earney Navy, PTA Pager: [phone]     Darliss Cheney 10/20/2016, 3:21 PM

## 2016-10-20 NOTE — Progress Notes (Signed)
Thank you for consult on Lisa [name]. Chart reviewed and discussed CIR program with patient and her family. They politely declined. She prefers to have HHPT as she feels that this will help her physically as well as psychologically. Family is aware of cardiac issues/risks and report that they will provide 24 hours supervision after discharge. Will defer CIR consult

## 2016-10-21 MED ORDER — DILTIAZEM HCL ER COATED BEADS 360 MG PO CP24: 360.0000 mg | ORAL_CAPSULE | Freq: Every day | ORAL | 6 refills | Status: DC

## 2016-10-21 MED ORDER — DILTIAZEM HCL 60 MG PO TABS: 60.0000 mg | ORAL_TABLET | ORAL | 2 refills | Status: DC | PRN

## 2016-10-21 MED ORDER — OXYCODONE-ACETAMINOPHEN 5-325 MG PO TABS: 1.0000 | ORAL_TABLET | Freq: Four times a day (QID) | ORAL | 0 refills | Status: DC | PRN

## 2016-10-21 MED ORDER — NITROGLYCERIN 0.4 MG SL SUBL: 0.4000 mg | SUBLINGUAL_TABLET | SUBLINGUAL | 12 refills | Status: DC | PRN

## 2016-10-21 NOTE — Progress Notes (Signed)
Progress Note  Patient Name: Lisa [name] Date of Encounter: 10/21/2016  Primary Cardiologist: Dr. Saunders Revel  Subjective   She was dyspneic with ambulation to bathroom. Now off oxygen. Declined inpatient rehab.   Inpatient Medications    Scheduled Meds: . ALPRAZolam  0.25 mg Oral Daily  . aspirin EC  81 mg Oral Daily  . atorvastatin  80 mg Oral q1800  . diltiazem  360 mg Oral Daily  . feeding supplement (GLUCERNA SHAKE)  237 mL Oral TID WC  . guaiFENesin  600 mg Oral BID  . heparin subcutaneous  5,000 Units Subcutaneous Q8H  . insulin aspart  0-20 Units Subcutaneous TID WC  . insulin aspart  6 Units Subcutaneous TID WC  . insulin glargine  25 Units Subcutaneous Daily  . mouth rinse  15 mL Mouth Rinse BID   Continuous Infusions:  PRN Meds: acetaminophen, ALPRAZolam, benzonatate, diltiazem, ipratropium, levalbuterol, morphine injection, nitroGLYCERIN, ondansetron (ZOFRAN) IV, oxyCODONE-acetaminophen, sodium chloride   Vital Signs    Vitals:   10/20/16 2029 10/21/16 0521 10/21/16 0523 10/21/16 1000  BP: 140/68 134/76    Pulse: 100 (!) 106    Resp: (!) 23 (!) 21    Temp: 97.9 F (36.6 C) 98.3 F (36.8 C)  98.2 F (36.8 C)  TempSrc: Oral Oral  Oral  SpO2: 93% 100%    Weight:   137 lb 11.2 oz (62.5 kg)   Height:        Intake/Output Summary (Last 24 hours) at 10/21/16 1112 Last data filed at 10/21/16 1000  Gross per 24 hour  Intake              480 ml  Output             1425 ml  Net             -945 ml   Filed Weights   10/19/16 0500 10/20/16 0229 10/21/16 0523  Weight: 136 lb 14.4 oz (62.1 kg) 136 lb 14.5 oz (62.1 kg) 137 lb 11.2 oz (62.5 kg)    Telemetry    SR at rate of 90-100s, no further afib - Personally Reviewed  ECG    None today  - Personally Reviewed  Physical Exam   GEN: No acute distress.   Neck: No JVD Cardiac: RRR, no murmurs, rubs, or gallops.  Respiratory: Diminished breath sound at base GI: Soft, nontender, non-distended  MS: No  edema; No deformity. Hematoma resolving. Mild bruise on L sided neck Neuro:  Nonfocal  Psych: Normal affect   Labs    Chemistry Recent Labs Lab 10/15/16 0234  10/17/16 0930 10/18/16 0240 10/18/16 1102  NA 122*  < > 124* 130* 127*  K 3.8  < > 4.7 4.0 3.5  CL 88*  < > 89* 89* 86*  CO2 27  < > 28 29 30   GLUCOSE 196*  < > 245* 195* 331*  BUN 11  < > 26* 23* 21*  CREATININE 0.61  < > 0.79 0.74 0.77  CALCIUM 6.9*  < > 8.0* 7.9* 8.3*  ALBUMIN 2.3*  --   --   --  2.6*  GFRNONAA >60  < > >60 >60 >60  GFRAA >60  < > >60 >60 >60  ANIONGAP 7  < > 7 12 11   < > = values in this interval not displayed.   Hematology Recent Labs Lab 10/18/16 0240 10/19/16 0322 10/20/16 0415  WBC 23.6* 27.9* 24.6*  RBC 3.14* 3.54* 3.63*  HGB  9.0* 10.1* 10.4*  HCT 26.0* 29.5* 30.9*  MCV 82.8 83.3 85.1  MCH 28.7 28.5 28.7  MCHC 34.6 34.2 33.7  RDW 14.4 14.4 15.0  PLT 308 405* 476*    BNP Recent Labs Lab 10/15/16 0234  BNP 92.9       Radiology    No results found.  Cardiac Studies   ECHO 10/12/16: Study Conclusions  - Left ventricle: The cavity size was normal. Systolic function was normal. The estimated ejection fraction was in the range of 55% to 60%. Wall motion was grossly normal; Unable to exclude mild hypokinesis of the inferior wall. Left ventricular diastolic function parameters were normal. - Left atrium: The atrium was normal in size. - Right ventricle: Systolic function was normal. - Pulmonary arteries: Systolic pressure was within the normal range.  Cardiac catheterization 10/11/16 Conclusions: 1. Severe 2 vessel coronary artery disease, including heavily calcified 95% proximal/mid LCx and sequential 90-99% mid RCA stenoses. 2. Mild to moderate, nonobstructive disease involving the LMCA and LAD. 3. Overall preserved LV contraction with basal inferior hypokinesis. 4. Upper normal to mildly elevated left ventricular filling pressure (LVEDP 15-20  mmHg). 5. Peripheral vascular disease with ectasia and calcification of the infrarenal abdominal aorta and iliac arteries.  Recommendations: 1. I suspect patient's cardiac arrest was due to demand ischemia in the setting of anaphylactic shock from allergic reaction to ceftriaxone. She has TIMI-3 flow in all major epicardial coronary arteries. I recommend medical therapy and treatment of the underlying infection. If she has a meaningful neurologic recovery, atherectomy/PCI to the RCA and LCx will need to be considered. ADDENDUM: Images reviewd on 10/12/16 at interventional conference. Given history of DM, severe calcification of lesions, and eccentricity of mid RCA stenosis, cardiac surgery consultation for CABG should be obtained. If Ms. Roediger is deemed a poor surgical candidate, high-risk PCI with atherectomy could be considered. 2. Obtain transthoracic echocardiogram to confirm LVEF and evaluate for other structural abnormalities. 3. I would like to avoid intra-aortic balloon pump placement if possible, given aortic disease. If hypotension becomes a problem, judicious vasopressor support is recommended. 4. Low-dose aspirin and high intensity statin therapy. Consider heparin infusion (ACS nomogram) if no invasive procedures are necessary (i.e. percutaneous nephrostomy tube). 5. Consider addition of low-dose beta blocker if heart rate and blood pressure allow. If significant ventricular ectopy is observed, amiodarone infusion should be considered.  Diagnostic Diagram       Nelva Bush, MD   Patient Profile     66 y.o. female with V. Fib arrest on 9/11 in the emergency department after getting ceftriaxone IV who underwent cardiac catheterization showing 95% proximal mid LAD and 99% sequential mid RCA with prior history of COPD. Had PAF on 9/17 and again on 9/19And 9/20. Slow progression.   Assessment & Plan    1. V. Fib arrest/Cardiac arrest  - No recurrent event. Per Dr. Olin Pia  note, arrest was felt to be secondary to demand ischemia in the setting of anaphylactic shock from ceftriaxone. She was placed on normothermia protocol, pressors,now extubated. He did not state that she needed a defibrillator. EF normal 55%  2. Severe coronary artery disease  - Circumflex and RCA, mild to moderate left main. EF normal.  Given her current underlying conditions, not felt to be a surgical candidate for Dr. Darcey Nora.  - Discussion with Dr. Ellyn Hack and Dr. [place] of interventional team to help formulate a plan. After lengthy discussion, decided to postpone attempts at atherectomy and percutaneous intervention because of her  recent spontaneous hematoma that developed in the setting of anticoagulation. She would require high-dose anticoagulation during procedure and double antiplatelet therapy and there is concern that this may exacerbate further bleeding at this time. Plan will be to discuss atherectomy in the next 2 weeks in clinic and decide on appropriate timing. -  If she has excessive anginal symptoms or hand may be forced to proceed earlier. No recurrent chest pain.   3. Paroxysmal atrial fibrillation  - on 10/17/16 and again on 9/19 and on 9/20-responded well to IV diltiazem 10 mg push. - Increase Cardizem to 360qd (Max dose).  - Avoiding anticoagulation because of recent spontaneous hematoma. Discussed potential stroke risk.  4. Groin hematoma  - Confirmed by CT scan on 9/16. Perhaps spontaneous bleed per report.   - Hemoglobin dropped from 13 down to 6.8 but responded well to 1 unit of transfusion. Hemoglobin stable at 10.4 today.  5. COPD  - Pulmonary signed off. Appreciate recommendations. Now off steroid. WBC decreasing slowly.    6. Hyponatremia  - Slowly improving from 122-126-130-127. Appreciate CCM input.   7. Nephrolithiasis/UTI  - has been treated with Cipro for Klebsiella Escherichia coli.   - her initial presentation was left flank plain. DC foley   8  Diabetes  - Hemoglobin A1c  7  -Hyperglycemia Should be improving after stopping steroids.  9 Disposition  - Patient declined Inpatient rehabilitation. She was dyspneic with minimal ambulation yesterday. PT following.     For questions or updates, please contact [place] Please consult [email or web address] for contact info under Cardiology/STEMI.      Signed, Leanor Kail, PA  10/21/2016, 11:12 AM    Personally seen and examined. Agree with above.  66 year old with prolonged hospital stay post VFIB arrest in the setting of anaphalyxis with normal EF, severe RCA and Circ disease, not a CABG candidate. Progressed slowly with COPD. Appreciate help from pulmonary.  Now to the point where she can leave the hospital.  Will set up HH/PT Encouraged CIR stay but they wish to go home.  Will give PO Dilt 30mg  PRN Q6 if HR elevates Avoiding beta blocker given prior wheezing per CCM Continue Dilt 360 QD  Will DC home.  7 day follow up with Dr. Saunders Revel or APP and 7 day follow up with PCP would be helpful.  Recommend BMET and CBC at clinic appt.   Candee Furbish, MD

## 2016-10-21 NOTE — Discharge Summary (Signed)
Discharge Summary    Patient ID: NEVAYA NAGELE,  MRN: [id number], DOB/AGE: [birth year] 66 y.o.  Admit date: 10/13/2016 Discharge date: 10/21/2016  Primary Care Provider: Lenard Simmer Primary Cardiologist: Dr. Saunders Revel  Discharge Diagnoses    Principal Problem:   Cardiac arrest with ventricular fibrillation [facility]) Active Problems:   Non-ST elevation (NSTEMI) myocardial infarction ([place])   Leukocytosis   Respiratory failure ([place])   Cardiogenic shock ([place])   Anaphylaxis   Left ureteral stone   Shock liver   Hypomagnesemia   Ventricular fibrillation (H[facility])   Hyponatremia   Acute respiratory failure (HCC)   Coronary artery disease involving native heart without angina pectoris   Kidney stone   Allergies Allergies  Allergen Reactions  . Ceftriaxone     Anaphylaxis, cardiac arrest    Diagnostic Studies/Procedures   ECHO 10/12/16: Study Conclusions  - Left ventricle: The cavity size was normal. Systolic function was normal. The estimated ejection fraction was in the range of55% to 60%. Wall motion was grossly normal; Unable to exclude mild hypokinesis of the inferior wall. Left ventricular diastolic function parameters were normal. - Left atrium: The atrium was normal in size. - Right ventricle: Systolic function was normal. - Pulmonary arteries: Systolic pressure was within the normal range.  Cardiac catheterization 10/11/16 Conclusions: 1. Severe 2 vessel coronary artery disease, including heavily calcified 95% proximal/mid LCx and sequential 90-99% mid RCA stenoses. 2. Mild to moderate, nonobstructive disease involving the LMCA and LAD. 3. Overall preserved LV contraction with basal inferior hypokinesis. 4. Upper normal to mildly elevated left ventricular filling pressure (LVEDP 15-20 mmHg). 5. Peripheral vascular disease with ectasia and calcification of the infrarenal abdominal aorta and iliac arteries.  Recommendations: 1. I suspect patient's  cardiac arrest was due to demand ischemia in the setting of anaphylactic shock from allergic reaction to ceftriaxone. She has TIMI-3 flow in all major epicardial coronary arteries. I recommend medical therapy and treatment of the underlying infection. If she has a meaningful neurologic recovery, atherectomy/PCI to the RCA and LCx will need to be considered. ADDENDUM: Images reviewd on 10/12/16 at interventional conference. Given history of DM, severe calcification of lesions, and eccentricity of mid RCA stenosis, cardiac surgery consultation for CABG should be obtained. If Ms. Jansma is deemed a poor surgical candidate, high-risk PCI with atherectomy could be considered. 2. Obtain transthoracic echocardiogram to confirm LVEF and evaluate for other structural abnormalities. 3. I would like to avoid intra-aortic balloon pump placement if possible, given aortic disease. If hypotension becomes a problem, judicious vasopressor support is recommended. 4. Low-dose aspirin and high intensity statin therapy. Consider heparin infusion (ACS nomogram) if no invasive procedures are necessary (i.e. percutaneous nephrostomy tube). 5. Consider addition of low-dose beta blocker if heart rate and blood pressure allow. If significant ventricular ectopy is observed, amiodarone infusion should be considered.  Diagnostic Diagram       Nelva Bush, MD    History of Present Illness     66 y.o. female with h/o type I DM with insulin pump who [facility]11 with flank pain and was found to have nephrolithiasis with possible UTI. Immediately after receiving IV Rocephin in the ED, patient started with seizure-like activity and suffered Vfib arrest.  No prior known cardiac history. CPR was started at 9:50 AM. She received 6 amps of epi, 1 amp of atropine, she was intubated with 125 mg Solumedrol being given, along with etomidate and succ, 50 mg of Benadryl. She was shocked  for Vfib x 4, 300 mg IV amiodarone  was pushed, 2 gm magnesium hung, femoral pulse noted at 10:14 AM, heart rate down into the 40s bpm with femoral pulse, CHB noted followed by PEA with restarting of CPR. Code STEMI called at 10:30 AM. Femoral pulse regained at 10:30 AM with heart rates in the 40s bpm. Patient lost pulse at 10:33 AM with CPR restarted with [facility] device. Cardiology arrived at bedside. Regained pulse at 10:34 AM with transcutaneous pacing started at 10:34. ICU MD consulted. IV magnesium and potassium were given. Patient stabilized and taken emergently to the cath lab that showed severe 2-vessel CAD, including heavily calcified 95% stenosis of the proximal/mid LCx and sequential 90-99% mid RCA stenoses. Mild to moderate, nonobstructive disease involving the left main and LAD. Overall, preserved LV systolic function with basal inferior hypokinesis, upper normal to mildly elevated LV filling pressures, and PVF with ectasia and calcification of the infrarenal abdominal aorta and iliac arteries. It was felt her cardiac arrest was due to demand ischemia in the setting of anaphylactic shock from allergic reaction to ceftriaxone. Transthoracic echocardiogram on 9/12 showed EF 55-60, grossly normal wall motion, unable to exclude mild hypokinesis of the inferior wall. Left ventricular diastolic function parameters were normal. Left atrium was normal in size. RV systolic function was normal. PASP normal. Extubated on the morning of 9/12, to 2 L via [place]. Case was discussed among Drs. Gollan and Arida with recommendation to transfer to [facility] for possible cardiac bypass evaluation.   Hospital Course     Consultants: [place], Urology, internal medicine & Pulmonary  1. V. Fib arrest/Cardiac arrest - No recurrent event. Per Dr. Olin Pia note, arrest was felt to be secondary to demand ischemia in the setting of anaphylactic shock from ceftriaxone. She was placed on normothermia protocol, pressors,now extubated. He did not state that she needed a  defibrillator. EF normal 55%  2. Severe coronary artery disease - Circumflex and RCA, mild to moderate left main. EF normal.  Given her current underlying conditions, not felt to be a surgical candidate for Dr. Darcey Nora.  - Discussion with Dr. Ellyn Hack and Dr. [place] of interventional team to help formulate a plan. After lengthy discussion, decided to postpone attempts at atherectomy and percutaneous intervention because of her recent spontaneous hematoma that developed in the setting of anticoagulation. She would require high-dose anticoagulation during procedure and double antiplatelet therapy and there is concern that this may exacerbate further bleeding at this time. Plan will be to discuss atherectomy in the next 2 weeks in clinic and decide on appropriate timing. -  If she has excessive anginal symptoms or hand may be forced to proceed earlier. No recurrent chest pain.   3. Paroxysmal atrial fibrillation - on 10/17/16 and again on 9/19and on 9/20-responded well to IV diltiazem 10 mg push. - Increased Cardizem to 360qd (Max dose). Rate stable.  - Avoiding anticoagulation because of recent spontaneous hematoma. Discussed potential stroke risk.  4. Groin hematoma - Confirmed by CT scan on 9/16. Perhaps spontaneous bleed per report.  - Hemoglobin dropped from 13 down to 6.8but responded well to 1 unit of transfusion. Hemoglobin remained stable at 10.  5. COPD - Pulmonary signed off. Appreciate recommendations. Now off steroid. WBC decreasing slowly. No BB given prior wheezing.   6. Hyponatremia - Slowly improving from 122-126-130-127. Appreciate CCM input.   7. Nephrolithiasis/UTI - has been treated with Cipro for Klebsiella Escherichia coli.  - her initial presentation was left flank plain. DC  foley  8 Diabetes - Hemoglobin A1c 7 -Hyperglycemia Should be improving after stopping steroids.  9 Disposition - Patient declined Inpatient rehabilitation. Encouraged CIR  stay but they wish to go home. Set up HHPT.   The patient has been seen by Dr. Marlou Porch today and deemed ready for discharge home. All follow-up appointments have been scheduled. Discharge medications are listed below.    Discharge Vitals Blood pressure 134/76, pulse (!) 106, temperature 98.2 F (36.8 C), temperature source Oral, resp. rate (!) 21, height 5\' 3"  (1.6 m), weight 137 lb 11.2 oz (62.5 kg), SpO2 100 %.  Filed Weights   10/19/16 0500 10/20/16 0229 10/21/16 0523  Weight: 136 lb 14.4 oz (62.1 kg) 136 lb 14.5 oz (62.1 kg) 137 lb 11.2 oz (62.5 kg)    Labs & Radiologic Studies     CBC  Recent Labs  10/19/16 0322 10/20/16 0415  WBC 27.9* 24.6*  HGB 10.1* 10.4*  HCT 29.5* 30.9*  MCV 83.3 85.1  PLT 405* 222*   Basic Metabolic Panel  Recent Labs  10/19/16 0322  MG 1.6*     Dg Chest 1 View  Result Date: 10/11/2016 CLINICAL DATA:  ET and NG tube placement. EXAM: CHEST 1 VIEW COMPARISON:  Chest x-ray from same day. FINDINGS: Unchanged positioning of the endotracheal tube with the tip approximately 3.9 cm above the level of the carina. New enteric tube in place with the tip and distal side port in the gastric body. The cardiomediastinal silhouette is normal in size. No focal consolidation, pleural effusion, or pneumothorax. No acute osseous abnormality. IMPRESSION: 1. Interval placement of an enteric tube with the tip and distal side port in the gastric body. 2.  No active cardiopulmonary disease. Electronically Signed   By: Titus Dubin M.D.   On: 10/11/2016 13:32   Dg Chest 1 View  Result Date: 10/11/2016 CLINICAL DATA:  Post intubation, former smoker, diabetes mellitus EXAM: CHEST 1 VIEW COMPARISON:  Portable exam 1039 hours without priors for comparison FINDINGS: Tip of endotracheal tube projects 4.8 cm above carina. External pacing leads project over chest. Normal heart size, mediastinal contours, and pulmonary vascularity. Lungs clear. No pleural effusion or  pneumothorax. Bones demineralized. IMPRESSION: No acute abnormalities. Electronically Signed   By: Lavonia Dana M.D.   On: 10/11/2016 11:52   US Renal  Result Date: 10/14/2016 CLINICAL DATA:  Kidney stone.  History of cardiac arrest. EXAM: RENAL / URINARY TRACT ULTRASOUND COMPLETE COMPARISON:  Abdominal CT 10/11/2016 FINDINGS: Right Kidney: Length: 11.5 cm. 2.2 cm hilar cyst. No hydronephrosis. Borderline hyperechoic renal cortex. Left Kidney: Length: 12.5 cm. Mild hydronephrosis, improved from CT 3 days ago. No evidence of mass. Bladder: Decompressed by Foley catheter. IMPRESSION: Mild left hydronephrosis, improved from CT 3 days ago. Electronically Signed   By: Monte Fantasia M.D.   On: 10/14/2016 09:05   Ct Femur Right Wo Contrast  Result Date: 10/16/2016 CLINICAL DATA:  Right thigh swelling. Raising vascular catheterization. EXAM: CT OF THE LOWER RIGHT EXTREMITY WITHOUT CONTRAST TECHNIQUE: Multidetector CT imaging of the right lower extremity was performed according to the standard protocol. COMPARISON:  CT pelvis 10/11/2016 FINDINGS: Bones/Joint/Cartilage Degenerative arthropathy of both hips. Small bilateral knee joint effusions. Ligaments Suboptimally assessed by CT. Muscles and Tendons There is no focal hematoma around the common femoral vasculature, but there is considerable high density expansion of the right hip adductor musculature, most notably the pectineus, adductor brevis, and adductor magnus muscles. Comparing to the contralateral side, the volume of adductor hematoma  on the right is estimated at 420 cubic cm (I obtain this by subtracting the volume of the normal left-sided muscles from the volume of the expanded right-sided adductor musculature). There are some lower density regions within the musculature likely representing mixed density hematoma. Infection and abscess is considered less likely. Soft tissues Subcutaneous edema noted in the right thigh. Edema tracks along deep fascia planes  of the medial compartment of the right thigh. Does Foley catheter in the urinary bladder. IMPRESSION: 1. Expansion and high density in the right hip adductor musculature as detailed above, volume of suspected hematoma at 420 cubic cm. There is edema in surrounding fascia planes and in the subcutaneous tissues of the right thigh. 2. No hematoma is observed along the common femoral vasculature directly. This raises suspicion that the right adductor hematoma is spontaneous. 3. Degenerative arthropathy of both hips. 4. Small bilateral knee joint effusions. Electronically Signed   By: Van Clines M.D.   On: 10/16/2016 14:03   Dg Chest Port 1 View  Result Date: 10/19/2016 CLINICAL DATA:  Acute respiratory failure, hypoxia EXAM: PORTABLE CHEST 1 VIEW COMPARISON:  10/18/2016 FINDINGS: Right base atelectasis with mildly elevated right hemidiaphragm. Cardiomegaly. Left lung is clear. No effusions or acute bony abnormality. IMPRESSION: Right base atelectasis.  Mild cardiomegaly. Electronically Signed   By: Rolm Baptise M.D.   On: 10/19/2016 08:42   Dg Chest Port 1 View  Result Date: 10/18/2016 CLINICAL DATA:  Acute respiratory failure EXAM: PORTABLE CHEST 1 VIEW COMPARISON:  10/17/2016 FINDINGS: Cardiac shadow is mildly enlarged but stable. Aortic calcifications are again seen. Lungs are well-aerated with right basilar atelectatic changes. Some mild interstitial changes are seen particularly on the left but stable. No new focal abnormality is seen. IMPRESSION: No significant interval change from the prior exam. Electronically Signed   By: Inez Catalina M.D.   On: 10/18/2016 07:56   Dg Chest Port 1 View  Result Date: 10/17/2016 CLINICAL DATA:  Cough, acute respiratory failure, hypoxia EXAM: PORTABLE CHEST 1 VIEW COMPARISON:  10/16/2016 FINDINGS: Cardiomegaly. Diffuse left lung airspace disease, most confluent in the left upper lobe. Right basilar atelectasis or infiltrate. Findings are similar to prior study.  Suspect small right effusion. IMPRESSION: Stable bilateral airspace opacities, left greater than right. Suspect small effusion on the right. No real change. Electronically Signed   By: Rolm Baptise M.D.   On: 10/17/2016 07:46   Dg Chest Port 1 View  Result Date: 10/16/2016 CLINICAL DATA:  Shortness of breath. EXAM: PORTABLE CHEST 1 VIEW COMPARISON:  October 15, 2016 FINDINGS: Left greater than right pulmonary opacities persist, improved in the interval. No other interval changes. IMPRESSION: Improving but persistent left greater than right pulmonary opacities. Electronically Signed   By: Dorise Bullion III M.D   On: 10/16/2016 08:40   Dg Chest Port 1 View  Result Date: 10/15/2016 CLINICAL DATA:  Followup acute respiratory failure. EXAM: PORTABLE CHEST 1 VIEW COMPARISON:  10/14/2016 FINDINGS: Widespread bilateral pneumonia persists, with slight radiographic improvement. Small amount of a fusion and focal volume loss in the right lower lobe. No worsening or new findings. IMPRESSION: Radiographic improvement of diffuse pneumonia. Some persistent volume loss at the right base. Electronically Signed   By: Nelson Chimes M.D.   On: 10/15/2016 07:49   Dg Chest Port 1 View  Result Date: 10/14/2016 CLINICAL DATA:  66 y/o  F; respiratory distress. EXAM: PORTABLE CHEST 1 VIEW COMPARISON:  10/13/2016 chest radiograph FINDINGS: Asymmetric consolidations of the lungs are stable from  prior chest radiographs. Small to moderate right pleural effusion. Stable cardiac silhouette within normal limits. Aortic atherosclerosis with calcification. No acute osseous abnormality. IMPRESSION: Stable asymmetric consolidations throughout the lungs and small to moderate right effusion. Differential includes multifocal pneumonia and pulmonary edema. Electronically Signed   By: Kristine Garbe M.D.   On: 10/14/2016 01:30   Dg Chest Port 1 View  Result Date: 10/13/2016 CLINICAL DATA:  Shortness of breath today EXAM:  PORTABLE CHEST 1 VIEW COMPARISON:  Earlier today FINDINGS: Continued progression of bilateral airspace disease, asymmetric to the left. Normal heart size for technique. Negative mediastinal contours. Small if any pleural effusions. No pneumothorax. IMPRESSION: Progression of asymmetric bilateral airspace disease favoring pneumonia or noncardiogenic edema (was there aspiration at time of cardiac arrest?). Electronically Signed   By: Monte Fantasia M.D.   On: 10/13/2016 13:44   Dg Chest Port 1 View  Result Date: 10/13/2016 CLINICAL DATA:  Respiratory failure. EXAM: PORTABLE CHEST 1 VIEW COMPARISON:  10/11/2016. FINDINGS: Interim removal of endotracheal tube and NG tube. Heart size normal. New onset of diffuse bilateral pulmonary interstitial prominence noted. Findings consistent with a process such as interstitial pneumonitis or interstitial edema. Small right pleural effusion. No pneumothorax . IMPRESSION: 1.  Interim removal of endotracheal tube and NG tube. 2. New onset of diffuse bilateral pulmonary interstitial prominence consistent with a process such as interstitial pneumonitis or interstitial edema. Small right pleural effusion. Electronically Signed   By: Marcello Moores  Register   On: 10/13/2016 06:19   Dg Abd Portable 1v  Result Date: 10/14/2016 CLINICAL DATA:  Ureteral calculus, abdominal pain EXAM: PORTABLE ABDOMEN - 1 VIEW COMPARISON:  CT abdomen and pelvis 10/11/2016 FINDINGS: LEFT pelvic phleboliths again identified. Distal LEFT ureteral calculus seen on prior CT exam is not visualized on current study. RIGHT femoral line noted. Bowel gas pattern normal. Diffuse osseous demineralization. IMPRESSION: Previously identified distal LEFT ureteral calculus is no longer seen. Electronically Signed   By: Lavonia Dana M.D.   On: 10/14/2016 07:40   Ct Renal Stone Study  Result Date: 10/11/2016 CLINICAL DATA:  Left flank pain.  History of renal stones. EXAM: CT ABDOMEN AND PELVIS WITHOUT CONTRAST TECHNIQUE:  Multidetector CT imaging of the abdomen and pelvis was performed following the standard protocol without IV contrast. COMPARISON:  None. FINDINGS: Lower chest: Minimal atelectasis in the lung bases. No pleural effusion. Three-vessel coronary artery atherosclerosis. Normal heart size. No pericardial effusion. Hepatobiliary: No focal liver abnormality is seen. No gallstones, gallbladder wall thickening, or biliary dilatation. Pancreas: Mildly truncated appearance of the pancreatic tail. No ductal dilatation or surrounding inflammatory changes. Spleen: Unremarkable. Adrenals/Urinary Tract: Unremarkable adrenal glands. Punctate nonobstructing calculus in the lower pole of the right kidney. Three punctate nonobstructing left renal calculi. 4 mm obstructing calculus in the distal left ureter near the UVJ resulting in mild hydroureteronephrosis. Mild left perinephric stranding. Unremarkable bladder. Stomach/Bowel: Small sliding hiatal hernia. No evidence of bowel obstruction or inflammation. Unremarkable appendix. Vascular/Lymphatic: Extensive atherosclerosis of the abdominal aorta with mild infrarenal aortic ectasia measuring up to 2.8 cm diameter. Central displacement of intimal calcification more distally in the aorta over a length of 2.5 cm suggests a short segment dissection. No enlarged lymph nodes. Reproductive: 9 mm calcification in the uterine fundus likely reflecting a small fibroid. Unremarkable ovaries. Other: No intraperitoneal free fluid. Small fat containing umbilical hernia. Musculoskeletal: Mild L4 and L5 superior endplate compression fractures, chronic in appearance. Mild spondylosis and moderate posterior element hypertrophy in the lower lumbar spine with likely  mild spinal stenosis at L3-4 and L4-5. IMPRESSION: 1. 4 mm distal left ureteral calculus with mild hydroureteronephrosis. 2. Punctate nonobstructing bilateral renal calculi. 3. Small hiatal hernia. 4. Aortic Atherosclerosis (ICD10-I70.0). Mild  infrarenal aortic ectasia and suspected short segment aortic dissection. Electronically Signed   By: Logan Bores M.D.   On: 10/11/2016 08:20    Disposition   Pt is being discharged home today in good condition.  Follow-up Plans & Appointments    Follow-up Information    Morayati, Lourdes Sledge, MD. Schedule an appointment as soon as possible for a visit in 1 week(s).   Specialty:  Endocrinology Why:  hospital follow up Contact information: [place] [address] [phone]        Nelva Bush, MD. Go on 10/25/2016.   Specialty:  Cardiology Why:  @2 :20 for TCM follow up Contact information: [place] [address] [place] Follow up.   Why:  rolling walker arranged- to be delivered to room prior to discharge Contact information: [address] [place] [address] [phone]          Discharge Instructions    Diet - low sodium heart healthy    Complete by:  As directed    Discharge instructions    Complete by:  As directed    No driving or No lifting over 10 lbs until seen by Dr. Saunders Revel. No sexual activity for 4 weeks.  Keep procedure site clean & dry. If you notice increased pain, swelling, bleeding or pus, call/return!  You may shower, but no soaking baths/hot tubs/pools for 1 week.   Increase activity slowly    Complete by:  As directed       Discharge Medications   Current Discharge Medication List    START taking these medications   Details  diltiazem (CARDIZEM) 60 MG tablet Take 1 tablet (60 mg total) by mouth as needed (1 table every 6 hours as needed for tachycardia). Qty: 30 tablet, Refills: 2    nitroGLYCERIN (NITROSTAT) 0.4 MG SL tablet Place 1 tablet (0.4 mg total) under the tongue every 5 (five) minutes x 3 doses as needed for chest pain. Qty: 25 tablet, Refills: 12    oxyCODONE-acetaminophen (PERCOCET/ROXICET) 5-325 MG tablet Take 1 tablet by mouth  every 6 (six) hours as needed for moderate pain or severe pain. Per [place] controlled substance review no recent narcotic Rx Qty: 20 tablet, Refills: 0      CONTINUE these medications which have CHANGED   Details  diltiazem (CARDIZEM CD) 360 MG 24 hr capsule Take 1 capsule (360 mg total) by mouth daily. Qty: 30 capsule, Refills: 6      CONTINUE these medications which have NOT CHANGED   Details  ALPRAZolam (XANAX) 0.25 MG tablet Take 0.25 mg by mouth daily.    aspirin EC 81 MG tablet Take 81 mg by mouth daily.    ezetimibe-simvastatin (VYTORIN) 10-20 MG tablet Take 1 tablet by mouth daily.    atorvastatin (LIPITOR) 80 MG tablet Take 1 tablet (80 mg total) by mouth daily at 6 PM. Qty: 30 tablet, Refills: 0    insulin aspart (NOVOLOG) 100 UNIT/ML injection Inject 3 Units into the skin 3 (three) times daily with meals. Qty: 10 mL, Refills: 11    insulin glargine (LANTUS) 100 UNIT/ML injection Inject 0.15 mLs (15 Units total) into the skin daily. Qty: 10 mL, Refills: 11  ipratropium-albuterol (DUONEB) 0.5-2.5 (3) MG/3ML SOLN Take 3 mLs by nebulization every 4 (four) hours as needed. Qty: 360 mL, Refills: 0      STOP taking these medications     brompheniramine-pseudoephedrine-DM 30-2-10 MG/5ML syrup      ciprofloxacin (CIPRO) 400 MG/200ML SOLN      heparin 100-0.45 UNIT/ML-% infusion            Outstanding Labs/Studies   BMET and CBC during follow up.   Duration of Discharge Encounter   Greater than 30 minutes including physician time.  Signed, Bhagat,Bhavinkumar PA-C 10/21/2016, 3:10 PM  Personally seen and examined. Agree with above.  66 year old with prolonged hospital stay post VFIB arrest in the setting of anaphalyxis with normal EF, severe RCA and Circ disease, not a CABG candidate. Progressed slowly with COPD. Appreciate help from pulmonary.  Now to the point where she can leave the hospital.  Will set up HH/PT Encouraged CIR stay but they wish to go  home.  Will give PO Dilt 30mg  PRN Q6 if HR elevates Avoiding beta blocker given prior wheezing per CCM Continue Dilt 360 QD  Will DC home.  7 day follow up with Dr. Saunders Revel or APP and 7 day follow up with PCP would be helpful.  Recommend BMET and CBC at clinic appt.   Candee Furbish, MD

## 2016-10-21 NOTE — Progress Notes (Signed)
Cbg 172

## 2016-10-21 NOTE — Progress Notes (Signed)
Discharged home with home health. Patient received rolling walker as ordered; prescription for pain medication given to fill at any pharmarcy. New medications called in to pharmarcy. Verbalized understanding of all written and verbal instructions. Left unit via wheelchair, accompanied by daughter and Patient care technician.

## 2016-10-21 NOTE — Care Management Note (Addendum)
Case Management Note Marvetta Gibbons RN, BSN Unit 4E-Case Manager-- [place] coverage [phone]  Patient Details  Name: Lisa [name] MRN: [id number] Date of Birth: 1950-04-12  Subjective/Objective:    Pt presented to [facility] on 9/11 with flank pain and was found to have nephrolithiasis with possible UTI. Immediately after receiving IV Rocephin in the ED, patient started with seizure-like activity and suffered Vfib arrest.- tx to [facility] for further care Currently on HFNC               Action/Plan: PTA pt lived at home- CM to follow for d/c needs  Expected Discharge Date:  10/21/16               Expected Discharge Plan:  Home/Self Care  In-House Referral:  NA  Discharge planning Services  CM Consult  Post Acute Care Choice:  Durable Medical Equipment, Home health Choice offered to:  Patient  DME Arranged:  [place] [facility] Agency:  [place] Arranged:  PT, OT [facility] Agency:   [place]  Status of Service:  Completed, signed off  If discussed at [place] of Stay Meetings, dates discussed:  9/20  Discharge Disposition: home/Home health   Additional Comments:  10/21/16- 1500- Drina Jobst RN, CM- pt for d/c home today-plan to return at a later date for procedure does not want rehab CIR or SNF-- does however want Home Health PT/OT and a RW for home not a 3n1-choice offered for [facility] agency in [facility]- per pt she would like to use [facility] for [facility] services- referral called to   [place] with [facility] for [facility] services and DME needs -RW to be delivered to room prior to discharge. PA on the floor and verbal orders given for [facility] and DME.  Update- notified by [facility] that pt is not in-network with them for [facility] services- spoke with pt regarding other choice for agency - pt states she does not have another preference as long as agency in-network- calls made to multiple other [facility] agencies and no one able to take referral- call then made to [facility]- for [employer] for [facility] services  assistance- spoke with Colletta [place] and info given for HHPT/OT needs for pt- intake # [id number]- plan for start date of 9/24 with end date of 10/15 pt is eligible for 6 visits of PT (2/wk for 3wks) and OT 3 visits (1/wk for 3wks)- [facility] will work on finding agency to contract with and contact pt- call made to pt - cell [phone]- spoke with pt's daughter and above info given along with [facility] contact #.    10/17/16- 58- Deven Furia RN, CM- pt found to have severe CAD- however not a surgical candidate for CABG. ??high risk PCI- CM will continue to follow.   Dawayne Patricia, RN 10/21/2016, 3:10 PM

## 2016-10-21 NOTE — Progress Notes (Signed)
Cbg 179.

## 2016-10-21 NOTE — Progress Notes (Signed)
[facility] has been following pt's progress with PT.  Noted she is for discharge today and to return in future for procedure. We will not refer to CRP 2 until pt returns and has atherectom  Will follow up then. Graylon Good RN BSN 10/21/2016 1:23 PM

## 2016-10-24 ENCOUNTER — Telehealth: Payer: Self-pay | Admitting: Internal Medicine

## 2016-10-24 SURGERY — CORONARY ARTERY BYPASS GRAFTING (CABG)
Anesthesia: General | Site: Chest

## 2016-10-24 NOTE — Telephone Encounter (Signed)
New message     Call came through answering service 10/21/16 they need orders for occupational and physical therapy  , please sign and fax orders to [phone]

## 2016-10-24 NOTE — Telephone Encounter (Signed)
This patient is followed by Dr End in [place], will forward.

## 2016-10-24 NOTE — Telephone Encounter (Signed)
S/w representative, Freida Busman, who stated they had already received orders today for the patient and nothing was needed from [place] at this time. She was unable to tell me who the doctor was who signed off on the orders.

## 2016-10-25 ENCOUNTER — Ambulatory Visit: Payer: Self-pay | Admitting: Internal Medicine

## 2016-10-25 ENCOUNTER — Inpatient Hospital Stay (HOSPITAL_COMMUNITY)
Admit: 2016-10-25 | Discharge: 2016-10-25 | Disposition: A | Discharge status: Home / Self Care | Payer: Managed Care, Other (non HMO) | Attending: Internal Medicine | Admitting: Internal Medicine

## 2016-10-25 ENCOUNTER — Telehealth: Payer: Self-pay | Admitting: Internal Medicine

## 2016-10-25 ENCOUNTER — Inpatient Hospital Stay
Admission: EM | Admit: 2016-10-25 | Discharge: 2016-10-30 | DRG: 190 | Disposition: A | Discharge status: Home / Self Care | Payer: Managed Care, Other (non HMO) | Attending: Internal Medicine | Admitting: Internal Medicine

## 2016-10-25 ENCOUNTER — Emergency Department: Payer: Managed Care, Other (non HMO)

## 2016-10-25 ENCOUNTER — Encounter: Payer: Self-pay | Admitting: Emergency Medicine

## 2016-10-25 DIAGNOSIS — J9601 Acute respiratory failure with hypoxia: Secondary | ICD-10-CM | POA: Diagnosis present

## 2016-10-25 DIAGNOSIS — I4891 Unspecified atrial fibrillation: Secondary | ICD-10-CM | POA: Diagnosis not present

## 2016-10-25 DIAGNOSIS — Z8674 Personal history of sudden cardiac arrest: Secondary | ICD-10-CM

## 2016-10-25 DIAGNOSIS — Z79899 Other long term (current) drug therapy: Secondary | ICD-10-CM | POA: Diagnosis not present

## 2016-10-25 DIAGNOSIS — Z9641 Presence of insulin pump (external) (internal): Secondary | ICD-10-CM | POA: Diagnosis present

## 2016-10-25 DIAGNOSIS — E8770 Fluid overload, unspecified: Secondary | ICD-10-CM | POA: Diagnosis present

## 2016-10-25 DIAGNOSIS — J440 Chronic obstructive pulmonary disease with acute lower respiratory infection: Secondary | ICD-10-CM | POA: Diagnosis present

## 2016-10-25 DIAGNOSIS — I312 Hemopericardium, not elsewhere classified: Secondary | ICD-10-CM | POA: Diagnosis present

## 2016-10-25 DIAGNOSIS — J189 Pneumonia, unspecified organism: Secondary | ICD-10-CM | POA: Diagnosis present

## 2016-10-25 DIAGNOSIS — T17590A Other foreign object in bronchus causing asphyxiation, initial encounter: Secondary | ICD-10-CM | POA: Diagnosis present

## 2016-10-25 DIAGNOSIS — J9809 Other diseases of bronchus, not elsewhere classified: Secondary | ICD-10-CM | POA: Diagnosis present

## 2016-10-25 DIAGNOSIS — Z881 Allergy status to other antibiotic agents status: Secondary | ICD-10-CM

## 2016-10-25 DIAGNOSIS — I313 Pericardial effusion (noninflammatory): Secondary | ICD-10-CM | POA: Diagnosis present

## 2016-10-25 DIAGNOSIS — R0602 Shortness of breath: Secondary | ICD-10-CM

## 2016-10-25 DIAGNOSIS — E871 Hypo-osmolality and hyponatremia: Secondary | ICD-10-CM | POA: Diagnosis present

## 2016-10-25 DIAGNOSIS — Z7982 Long term (current) use of aspirin: Secondary | ICD-10-CM | POA: Diagnosis not present

## 2016-10-25 DIAGNOSIS — D649 Anemia, unspecified: Secondary | ICD-10-CM | POA: Diagnosis present

## 2016-10-25 DIAGNOSIS — I25118 Atherosclerotic heart disease of native coronary artery with other forms of angina pectoris: Secondary | ICD-10-CM | POA: Diagnosis not present

## 2016-10-25 DIAGNOSIS — R0902 Hypoxemia: Secondary | ICD-10-CM | POA: Diagnosis not present

## 2016-10-25 DIAGNOSIS — I480 Paroxysmal atrial fibrillation: Secondary | ICD-10-CM | POA: Diagnosis present

## 2016-10-25 DIAGNOSIS — E785 Hyperlipidemia, unspecified: Secondary | ICD-10-CM | POA: Diagnosis present

## 2016-10-25 DIAGNOSIS — J209 Acute bronchitis, unspecified: Secondary | ICD-10-CM | POA: Diagnosis present

## 2016-10-25 DIAGNOSIS — E1051 Type 1 diabetes mellitus with diabetic peripheral angiopathy without gangrene: Secondary | ICD-10-CM | POA: Diagnosis present

## 2016-10-25 DIAGNOSIS — I2510 Atherosclerotic heart disease of native coronary artery without angina pectoris: Secondary | ICD-10-CM | POA: Diagnosis present

## 2016-10-25 DIAGNOSIS — J441 Chronic obstructive pulmonary disease with (acute) exacerbation: Principal | ICD-10-CM | POA: Diagnosis present

## 2016-10-25 DIAGNOSIS — Z87891 Personal history of nicotine dependence: Secondary | ICD-10-CM

## 2016-10-25 DIAGNOSIS — R0609 Other forms of dyspnea: Secondary | ICD-10-CM | POA: Diagnosis not present

## 2016-10-25 DIAGNOSIS — J9811 Atelectasis: Secondary | ICD-10-CM | POA: Diagnosis not present

## 2016-10-25 DIAGNOSIS — I252 Old myocardial infarction: Secondary | ICD-10-CM

## 2016-10-25 HISTORY — DX: Contusion of abdominal wall, initial encounter: S30.1XXA

## 2016-10-25 HISTORY — DX: Contusion of groin, initial encounter: S30.12XA

## 2016-10-25 LAB — COMPREHENSIVE METABOLIC PANEL
ALT: 38 U/L (ref 14–54)
AST: 47 U/L — AB (ref 15–41)
Albumin: 3.1 g/dL — ABNORMAL LOW (ref 3.5–5.0)
Alkaline Phosphatase: 109 U/L (ref 38–126)
Anion gap: 12 (ref 5–15)
BILIRUBIN TOTAL: 2 mg/dL — AB (ref 0.3–1.2)
BUN: 13 mg/dL (ref 6–20)
CO2: 25 mmol/L (ref 22–32)
CREATININE: 0.52 mg/dL (ref 0.44–1.00)
Calcium: 8.8 mg/dL — ABNORMAL LOW (ref 8.9–10.3)
Chloride: 90 mmol/L — ABNORMAL LOW (ref 101–111)
GFR calc Af Amer: >60 mL/min (ref >60)
Glucose, Bld: 187 mg/dL — ABNORMAL HIGH (ref 65–99)
POTASSIUM: 3.5 mmol/L (ref 3.5–5.1)
Sodium: 127 mmol/L — ABNORMAL LOW (ref 135–145)
TOTAL PROTEIN: 6.7 g/dL (ref 6.5–8.1)

## 2016-10-25 LAB — URINALYSIS, COMPLETE (UACMP) WITH MICROSCOPIC
Bilirubin Urine: NEGATIVE
Glucose, UA: NEGATIVE mg/dL
KETONES UR: 5 mg/dL — AB
Leukocytes, UA: NEGATIVE
NITRITE: NEGATIVE
PH: 7 (ref 5.0–8.0)
Protein, ur: NEGATIVE mg/dL
Specific Gravity, Urine: 1.011 (ref 1.005–1.030)

## 2016-10-25 LAB — CBC
HEMATOCRIT: 30.4 % — AB (ref 35.0–47.0)
Hemoglobin: 10.2 g/dL — ABNORMAL LOW (ref 12.0–16.0)
MCH: 28.5 pg (ref 26.0–34.0)
MCHC: 33.5 g/dL (ref 32.0–36.0)
MCV: 85.2 fL (ref 80.0–100.0)
Platelets: 555 10*3/uL — ABNORMAL HIGH (ref 150–440)
RBC: 3.57 MIL/uL — ABNORMAL LOW (ref 3.80–5.20)
RDW: 16.2 % — AB (ref 11.5–14.5)
WBC: 17.6 10*3/uL — AB (ref 3.6–11.0)

## 2016-10-25 LAB — BRAIN NATRIURETIC PEPTIDE: B NATRIURETIC PEPTIDE 5: 69 pg/mL (ref 0.0–100.0)

## 2016-10-25 LAB — GLUCOSE, CAPILLARY
GLUCOSE-CAPILLARY: 171 mg/dL — AB (ref 65–99)
Glucose-Capillary: 257 mg/dL — ABNORMAL HIGH (ref 65–99)

## 2016-10-25 LAB — MAGNESIUM: Magnesium: 1.6 mg/dL — ABNORMAL LOW (ref 1.7–2.4)

## 2016-10-25 LAB — TROPONIN I: Troponin I: 0.03 ng/mL (ref <0.03)

## 2016-10-25 MED ORDER — IPRATROPIUM-ALBUTEROL 0.5-2.5 (3) MG/3ML IN SOLN
3.0000 mL | Freq: Four times a day (QID) | RESPIRATORY_TRACT | Status: DC
  Administered 2016-10-26: 3 mL via RESPIRATORY_TRACT
  Filled 2016-10-25 (×2): qty 3

## 2016-10-25 MED ORDER — ACETAMINOPHEN 650 MG RE SUPP: 650.0000 mg | Freq: Four times a day (QID) | RECTAL | Status: DC | PRN

## 2016-10-25 MED ORDER — POTASSIUM CHLORIDE CRYS ER 20 MEQ PO TBCR
40.0000 meq | EXTENDED_RELEASE_TABLET | Freq: Once | ORAL | Status: AC
  Administered 2016-10-25: 40 meq via ORAL
  Filled 2016-10-25: qty 2

## 2016-10-25 MED ORDER — POLYETHYLENE GLYCOL 3350 17 G PO PACK
17.0000 g | PACK | Freq: Every day | ORAL | Status: DC | PRN
  Administered 2016-10-27: 17 g via ORAL
  Filled 2016-10-25: qty 1

## 2016-10-25 MED ORDER — OXYCODONE HCL 5 MG PO TABS: 5.0000 mg | ORAL_TABLET | ORAL | Status: DC | PRN

## 2016-10-25 MED ORDER — ALPRAZOLAM 0.25 MG PO TABS
0.2500 mg | ORAL_TABLET | Freq: Every day | ORAL | Status: DC
  Administered 2016-10-26 – 2016-10-30 (×5): 0.25 mg via ORAL
  Filled 2016-10-25 (×5): qty 1

## 2016-10-25 MED ORDER — ONDANSETRON HCL 4 MG PO TABS: 4.0000 mg | ORAL_TABLET | Freq: Four times a day (QID) | ORAL | Status: DC | PRN

## 2016-10-25 MED ORDER — INSULIN ASPART 100 UNIT/ML ~~LOC~~ SOLN: 0.0000 [IU] | Freq: Every day | SUBCUTANEOUS | Status: DC

## 2016-10-25 MED ORDER — LEVOFLOXACIN IN D5W 750 MG/150ML IV SOLN
750.0000 mg | INTRAVENOUS | Status: DC
  Administered 2016-10-25 – 2016-10-27 (×3): 750 mg via INTRAVENOUS
  Filled 2016-10-25 (×4): qty 150

## 2016-10-25 MED ORDER — FUROSEMIDE 10 MG/ML IJ SOLN
20.0000 mg | Freq: Once | INTRAMUSCULAR | Status: AC
  Administered 2016-10-25: 20 mg via INTRAVENOUS
  Filled 2016-10-25: qty 2

## 2016-10-25 MED ORDER — EZETIMIBE-SIMVASTATIN 10-20 MG PO TABS: 1.0000 | ORAL_TABLET | Freq: Every day | ORAL | Status: DC

## 2016-10-25 MED ORDER — ASPIRIN EC 81 MG PO TBEC
81.0000 mg | DELAYED_RELEASE_TABLET | Freq: Every day | ORAL | Status: DC
Start: 2016-10-26 — End: 2016-10-30
  Administered 2016-10-26 – 2016-10-30 (×5): 81 mg via ORAL
  Filled 2016-10-25 (×5): qty 1

## 2016-10-25 MED ORDER — ALBUTEROL SULFATE (2.5 MG/3ML) 0.083% IN NEBU: 2.5000 mg | INHALATION_SOLUTION | RESPIRATORY_TRACT | Status: DC | PRN

## 2016-10-25 MED ORDER — ACETAMINOPHEN 325 MG PO TABS: 650.0000 mg | ORAL_TABLET | Freq: Four times a day (QID) | ORAL | Status: DC | PRN

## 2016-10-25 MED ORDER — SIMVASTATIN 20 MG PO TABS: 20.0000 mg | ORAL_TABLET | Freq: Every day | ORAL | Status: DC

## 2016-10-25 MED ORDER — INSULIN ASPART 100 UNIT/ML ~~LOC~~ SOLN: 3.0000 [IU] | Freq: Three times a day (TID) | SUBCUTANEOUS | Status: DC

## 2016-10-25 MED ORDER — INSULIN PUMP
Freq: Three times a day (TID) | SUBCUTANEOUS | Status: DC
  Administered 2016-10-25: 8.2 via SUBCUTANEOUS
  Administered 2016-10-26: 4.1 via SUBCUTANEOUS
  Administered 2016-10-26: 8.7 via SUBCUTANEOUS
  Administered 2016-10-26: 7.9 via SUBCUTANEOUS
  Administered 2016-10-26: 10 via SUBCUTANEOUS
  Administered 2016-10-27: 3.2 via SUBCUTANEOUS
  Administered 2016-10-27: 5 via SUBCUTANEOUS
  Administered 2016-10-27: 8.2 via SUBCUTANEOUS
  Administered 2016-10-27: 8.8 via SUBCUTANEOUS
  Administered 2016-10-27: 10 via SUBCUTANEOUS
  Administered 2016-10-28: 2.1 via SUBCUTANEOUS
  Administered 2016-10-28: 22:00:00 via SUBCUTANEOUS
  Administered 2016-10-28 (×3): 1 via SUBCUTANEOUS
  Administered 2016-10-29: 8.1 via SUBCUTANEOUS
  Administered 2016-10-29: 10 via SUBCUTANEOUS
  Administered 2016-10-29 – 2016-10-30 (×5): via SUBCUTANEOUS
  Filled 2016-10-25: qty 1

## 2016-10-25 MED ORDER — EZETIMIBE 10 MG PO TABS
10.0000 mg | ORAL_TABLET | Freq: Every day | ORAL | Status: DC
  Administered 2016-10-26 – 2016-10-30 (×5): 10 mg via ORAL
  Filled 2016-10-25 (×5): qty 1

## 2016-10-25 MED ORDER — INSULIN ASPART 100 UNIT/ML ~~LOC~~ SOLN
0.0000 [IU] | Freq: Three times a day (TID) | SUBCUTANEOUS | Status: DC
  Administered 2016-10-25: 5.8 [IU] via SUBCUTANEOUS

## 2016-10-25 MED ORDER — POTASSIUM CHLORIDE CRYS ER 10 MEQ PO TBCR
EXTENDED_RELEASE_TABLET | ORAL | Status: AC
  Filled 2016-10-25: qty 2

## 2016-10-25 MED ORDER — ONDANSETRON HCL 4 MG/2ML IJ SOLN: 4.0000 mg | Freq: Four times a day (QID) | INTRAMUSCULAR | Status: DC | PRN

## 2016-10-25 MED ORDER — METHYLPREDNISOLONE SODIUM SUCC 125 MG IJ SOLR
60.0000 mg | INTRAMUSCULAR | Status: DC
  Administered 2016-10-25 – 2016-10-27 (×3): 60 mg via INTRAVENOUS
  Filled 2016-10-25 (×3): qty 2

## 2016-10-25 MED ORDER — IOPAMIDOL (ISOVUE-370) INJECTION 76%
75.0000 mL | Freq: Once | INTRAVENOUS | Status: AC | PRN
  Administered 2016-10-25: 75 mL via INTRAVENOUS

## 2016-10-25 NOTE — ED Notes (Signed)
Date and time results received: 10/25/16 1545 (use smartphrase ".now" to insert current time)  Test: troponin Critical Value: 0.03  Name of Provider Notified: Dr. Quentin Cornwall  Orders Received? Or Actions Taken?: Orders Received - See Orders for details

## 2016-10-25 NOTE — ED Provider Notes (Signed)
[facility] Emergency Department Provider Note   ____________________________________________   First MD Initiated Contact with Patient 10/25/16 1449     (approximate)  I have reviewed the triage vital signs and the nursing notes.   HISTORY  Chief Complaint Shortness of Breath    HPI Lisa [name] is a 66 y.o. female history of recent cardiac arrest due to anaphylaxis from antibiotic  Patient was discharged from the hospital just a couple of days ago, reports when she was discharged she had some slight shortness of breath and it seems to be slowly worsening. His more. When she tries to lay down and reports she can't lay flat well without starting to feel short of breath.  Denies chest pain other than ongoing "soreness" from chest compressions. This is improving. He also reports she had a cough when she left the hospital, but this is improved. She is no longer coughing. Not having any fever  denies pain. Relevant history includes cardiac arrest known coronary disease.  Past Medical History:  Diagnosis Date  . CAD (coronary artery disease)    a. LHC 10/11/2016: LM 20%, oLAD 40%, p-mLAD 40% eccentric/calcified, dLAD 40%, p-mLCx 95%, eccentric, sev calcified, pRCA 30%, eccentric, sev calcified, mRCA lesion-1 90% focal & eccentric, sev calcified, mRCA lesion-2 99% sev calcified, EF 55-65%  . Nephrolithiasis   . NSTEMI (non-ST elevated myocardial infarction) ([place]) 10/11/2016  . Pneumonia 02/2016  . PVD (peripheral vascular disease) ([place])    a. Abdominal aortogram 10/11/16: Infrarenal abdominal aorta heavily calcified & ectatic with approximately 40-50% stenosis at the bifurcation. Mild diffuse disease w/ heavy calcification noted in the common & external iliac arteries bilaterally  . Type I diabetes mellitus ([place])   . Ventricular fibrillation ([place]) 10/11/2016   a. Felt to be secondary to demand ischemia in the setting of underlying CAD secondary to  anaphylactic reaction from IV Rocephin    Patient Active Problem List   Diagnosis Date Noted  . Acute respiratory failure ([place])   . Coronary artery disease involving native heart without angina pectoris   . Kidney stone   . Shock liver 10/13/2016  . Hypomagnesemia 10/13/2016  . Ventricular fibrillation ([place]) 10/13/2016  . Hyponatremia 10/13/2016  . Non-ST elevation (NSTEMI) myocardial infarction ([place]) 10/12/2016  . Renal stone 10/12/2016  . Leukocytosis 10/12/2016  . AKI (acute kidney injury) ([place]) 10/12/2016  . Respiratory failure ([place]) 10/12/2016  . Cardiogenic shock ([place]) 10/12/2016  . Anaphylaxis 10/12/2016  . Left ureteral stone   . Cardiac arrest with ventricular fibrillation ([place]) 10/11/2016    Past Surgical History:  Procedure Laterality Date  . ABDOMINAL AORTOGRAM N/A 10/11/2016   Procedure: ABDOMINAL AORTOGRAM;  Surgeon: Nelva Bush, MD;  Location: [place] [facility];  Service: Cardiovascular;  Laterality: N/A;  . LEFT HEART CATH AND CORONARY ANGIOGRAPHY N/A 10/11/2016   Procedure: LEFT HEART CATH AND CORONARY ANGIOGRAPHY;  Surgeon: Nelva Bush, MD;  Location: [place] [facility];  Service: Cardiovascular;  Laterality: N/A;  . TONSILLECTOMY      Prior to Admission medications   Medication Sig Start Date End Date Taking? Authorizing Provider  ALPRAZolam (XANAX) 0.25 MG tablet Take 0.25 mg by mouth daily.   Yes [provider]  aspirin EC 81 MG tablet Take 81 mg by mouth daily.   Yes [provider]  diltiazem (CARDIZEM) 60 MG tablet Take 1 tablet (60 mg total) by mouth as needed (1 table every 6 hours as needed for tachycardia). 10/21/16 10/21/17 Yes Bhagat, Crista Luria,  PA  ezetimibe-simvastatin (VYTORIN) 10-20 MG tablet Take 1 tablet by mouth daily.   Yes [provider]  insulin aspart (NOVOLOG) 100 UNIT/ML injection Inject 3 Units into the skin 3 (three) times daily with meals. 10/13/16  Yes Vaughan Basta, MD    atorvastatin (LIPITOR) 80 MG tablet Take 1 tablet (80 mg total) by mouth daily at 6 PM. Patient not taking: Reported on 10/25/2016 10/13/16   Vaughan Basta, MD  diltiazem (CARDIZEM CD) 360 MG 24 hr capsule Take 1 capsule (360 mg total) by mouth daily. Patient not taking: Reported on 10/25/2016 10/21/16   Leanor Kail, PA  insulin glargine (LANTUS) 100 UNIT/ML injection Inject 0.15 mLs (15 Units total) into the skin daily. Patient not taking: Reported on 10/25/2016 10/13/16   Vaughan Basta, MD  ipratropium-albuterol (DUONEB) 0.5-2.5 (3) MG/3ML SOLN Take 3 mLs by nebulization every 4 (four) hours as needed. Patient not taking: Reported on 10/25/2016 10/13/16   Vaughan Basta, MD  nitroGLYCERIN (NITROSTAT) 0.4 MG SL tablet Place 1 tablet (0.4 mg total) under the tongue every 5 (five) minutes x 3 doses as needed for chest pain. 10/21/16   Bhagat, Crista Luria, PA  oxyCODONE-acetaminophen (PERCOCET/ROXICET) 5-325 MG tablet Take 1 tablet by mouth every 6 (six) hours as needed for moderate pain or severe pain. Per [facility] controlled substance review no recent narcotic Rx 10/21/16   Leanor Kail, PA    Allergies Ceftriaxone and Rocephin [ceftriaxone sodium in dextrose]  Family History  Problem Relation Age of Onset  . Hypertension Mother   . Aortic aneurysm Father   . Hypertension Brother   . Lung disease Neg Hx   . Rheumatologic disease Neg Hx     Social History Social History  Substance Use Topics  . Smoking status: Current Every Day Smoker    Packs/day: 0.50    Years: 47.00    Types: Cigarettes    Start date: 01/25/1969  . Smokeless tobacco: Never Used     Comment: Second-hand exposure through her husband & father as well.  . Alcohol use 0.0 oz/week    Review of Systems Constitutional: No fever/chills Eyes: No visual changes. ENT: No sore throat. Cardiovascular: Denies chest pain. Respiratory: see history of present illness Gastrointestinal: No  abdominal pain.  No nausea, no vomiting.  No diarrhea.  No constipation. Genitourinary: Negative for dysuria. Musculoskeletal: Negative for back pain. Skin: Negative for rash. Neurological: Negative for headaches, focal weakness or numbness.    ____________________________________________   PHYSICAL EXAM:  VITAL SIGNS: ED Triage Vitals  Enc Vitals Group     BP 10/25/16 1440 (!) 101/55     Pulse Rate 10/25/16 1440 80     Resp 10/25/16 1440 20     Temp 10/25/16 1440 98.6 F (37 C)     Temp Source 10/25/16 1440 Oral     SpO2 10/25/16 1423 92 %     Weight 10/25/16 1441 137 lb (62.1 kg)     Height 10/25/16 1441 5\' 3"  (1.6 m)     Head Circumference --      Peak Flow --      Pain Score 10/25/16 1440 0     Pain Loc --      Pain Edu? --      Excl. in [place]? --     Constitutional: Alert and oriented. minimally dyspneic appearance. Reports she is feeling much better now that she is on oxygen Eyes: Conjunctivae are normal. Head: Atraumatic. Nose: No congestion/rhinnorhea. Mouth/Throat: Mucous membranes are moist. Neck: No  stridor.   Cardiovascular: Normal rate, regular rhythm. Grossly normal heart sounds.  Good peripheral circulation. Respiratory: Normal respiratory effort.  diminished lung sounds in the bases bilaterally, question slightly more so on the right. No wheezing. Speaks in clear sentences Gastrointestinal: Soft and nontender. No distention. Musculoskeletal: No lower extremity tenderness nor edema. Neurologic:  Normal speech and language. No gross focal neurologic deficits are appreciated.  Skin:  Skin is warm, dry and intact. No rash noted. Psychiatric: Mood and affect are normal. Speech and behavior are normal.  ____________________________________________   LABS (all labs ordered are listed, but only abnormal results are displayed)  Labs Reviewed  CBC - Abnormal; Notable for the following:       Result Value   WBC 17.6 (*)    RBC 3.57 (*)    Hemoglobin 10.2 (*)     HCT 30.4 (*)    RDW 16.2 (*)    Platelets 555 (*)    All other components within normal limits  COMPREHENSIVE METABOLIC PANEL - Abnormal; Notable for the following:    Sodium 127 (*)    Chloride 90 (*)    Glucose, Bld 187 (*)    Calcium 8.8 (*)    Albumin 3.1 (*)    AST 47 (*)    Total Bilirubin 2.0 (*)    All other components within normal limits  TROPONIN I - Abnormal; Notable for the following:    Troponin I 0.03 (*)    All other components within normal limits  CULTURE, BLOOD (ROUTINE X 2)  CULTURE, BLOOD (ROUTINE X 2)  BRAIN NATRIURETIC PEPTIDE  URINALYSIS, COMPLETE (UACMP) WITH MICROSCOPIC   ____________________________________________  EKG  reviewed and interpreted by me at 1430 Ventricular rate 75 QRS 80 QTc 410 Normal sinus rhythm ____________________________________________  RADIOLOGY  Dg Chest Port 1 View  Result Date: 10/25/2016 CLINICAL DATA:  reports of SOB and cough. Pt was seen here recently and had anaphylactic reaction to rocephin last week, history of CAD, NSTEMI, diabetes, ventricular fibrillation EXAM: PORTABLE CHEST 1 VIEW COMPARISON:  10/19/2016 FINDINGS: There is elevation of the RIGHT hemidiaphragm not changed from 10/19/2016 but new from 10/11/2016. Normal cardiac silhouette. Lungs relatively clear. Mild RIGHT basilar atelectasis. No acute osseous abnormality. IMPRESSION: Elevation of the RIGHT hemidiaphragm is new from 10/11/2016 with differential including subpulmonic effusion versus potential diaphragmatic paralysis. Electronically Signed   By: Suzy Bouchard M.D.   On: 10/25/2016 15:11   chest x-ray noted right hemidiaphragm elevation, reviewed by me ____________________________________________   PROCEDURES  Procedure(s) performed: None  Procedures  Critical Care performed: No  ____________________________________________   INITIAL IMPRESSION / ASSESSMENT AND PLAN / ED COURSE  Pertinent labs & imaging results that were  available during my care of the patient were reviewed by me and considered in my medical decision making (see chart for details).  patient presents forelevation or shortness of breath,. Hypoxic on arrival with saturation 84% on room air, corrects with oxygen and her symptoms improved markedly.   Noted to have elevated white count which is been ongoing but downtrending. She is afebrile. She has had a recent productive cough but reports this is slowly improving.Denies chest pain or acute cardiac symptoms. EKG and troponin reassuring. Mild ongoing hyponatremia  Discussed with the hospitalist, we will admit the patient for further workup, CT of the chest with contrast pending and will be followed up by Dr. Darvin Neighbours.     ____________________________________________   FINAL CLINICAL IMPRESSION(S) / ED DIAGNOSES  Final diagnoses:  Hypoxia  NEW MEDICATIONS STARTED DURING THIS VISIT:  New Prescriptions   No medications on file     Note:  This document was prepared using Dragon voice recognition software and may include unintentional dictation errors.     Delman Kitten, MD 10/25/16 [phone]

## 2016-10-25 NOTE — ED Notes (Signed)
Attempted to call report, receiving RN unable to take report at this time and will call when available.

## 2016-10-25 NOTE — ED Triage Notes (Signed)
Pt arrived POV for reports of SOB and cough. Pt was seen here recently and had anaphylactic reaction to rocephin. Pt room air SpO2 84%, pt placed on 3L O2 via [place] and SpO2 increased to 92%. Pt reports cough. Bilateral wheezes heard at lung bases.

## 2016-10-25 NOTE — Telephone Encounter (Signed)
No answer. Left message to call back.   

## 2016-10-25 NOTE — Telephone Encounter (Signed)
Pt c/o swelling: STAT is pt has developed SOB within 24 hours  1. How long have you been experiencing swelling? Started yesterday   2. Where is the swelling located? Lower legs   3.  Are you currently taking a "fluid pill"? No   4.  Are you currently SOB? A little, not all the time  5.  Have you traveled recently? No

## 2016-10-25 NOTE — Telephone Encounter (Signed)
Received incoming call from patient and husband. Patient gave verbal permission for me to speak with husband over the phone. Patient started having swelling in lower extremities (calves, ankles, feet) yesterday. She continues to be short of breath walking short distances such as to the bathroom. She has a cough that she has had since being in the hospital that remains about the same. Patient's O2 is between 89-91% at rest. Husband reports it has been as low as 86% at times. BP 134/61, HR 96. Dr. Saunders Revel made aware and advised patient to proceed to the ED for evaluation. Husband and patient verbalized understanding and will go there immediately. ER charge nurse notified that patient would be on her way.

## 2016-10-25 NOTE — H&P (Signed)
[facility] NAME: Lisa [name]    MR#:  [id number]  DATE OF BIRTH:  February 08, 1950  DATE OF ADMISSION:  10/25/2016  PRIMARY CARE PHYSICIAN: Lenard Simmer, MD   REQUESTING/REFERRING PHYSICIAN: Dr. Jacqualine Code  CHIEF COMPLAINT:   Chief Complaint  Patient presents with  . Shortness of Breath    HISTORY OF PRESENT ILLNESS:  Lisa [name]  is a 66 y.o. female with a known history of diabetes, recent cardiac arrest after ceftriaxone dose and diagnosis of 3 vessel CAD returns to the hospital after discharge from [facility] on 10/21/2016 with shortness of breath. Patient was initially thought to be a candidate for CABG but due to poor pulmonary status this has been deferred. She needs evaluation as outpatient for high risk PCI. Today she was supposed to have cardiology follow-up in the office but due to worsening shortness of breath since yesterday called them and was directed to the emergency room. CT scan of the chest shows no PE. Does show bilateral pneumonitis. Small pericardial effusion concerning for hemopericardium. No fractures from recent CPR. Patient has had orthopnea since leaving the hospital which has unchanged. Mild ankle swelling. She has not smoked since her cardiac arrest. Saturations 84% on room air and placed on 3 L oxygen.  PAST MEDICAL HISTORY:   Past Medical History:  Diagnosis Date  . CAD (coronary artery disease)    a. LHC 10/11/2016: LM 20%, oLAD 40%, p-mLAD 40% eccentric/calcified, dLAD 40%, p-mLCx 95%, eccentric, sev calcified, pRCA 30%, eccentric, sev calcified, mRCA lesion-1 90% focal & eccentric, sev calcified, mRCA lesion-2 99% sev calcified, EF 55-65%  . Nephrolithiasis   . NSTEMI (non-ST elevated myocardial infarction) ([place]) 10/11/2016  . Pneumonia 02/2016  . PVD (peripheral vascular disease) ([place])    a. Abdominal aortogram 10/11/16: Infrarenal abdominal aorta heavily calcified & ectatic with approximately 40-50%  stenosis at the bifurcation. Mild diffuse disease w/ heavy calcification noted in the common & external iliac arteries bilaterally  . Type I diabetes mellitus ([place])   . Ventricular fibrillation ([place]) 10/11/2016   a. Felt to be secondary to demand ischemia in the setting of underlying CAD secondary to anaphylactic reaction from IV Rocephin    PAST SURGICAL HISTORY:   Past Surgical History:  Procedure Laterality Date  . ABDOMINAL AORTOGRAM N/A 10/11/2016   Procedure: ABDOMINAL AORTOGRAM;  Surgeon: Nelva Bush, MD;  Location: [place] [facility];  Service: Cardiovascular;  Laterality: N/A;  . LEFT HEART CATH AND CORONARY ANGIOGRAPHY N/A 10/11/2016   Procedure: LEFT HEART CATH AND CORONARY ANGIOGRAPHY;  Surgeon: Nelva Bush, MD;  Location: [place] [facility];  Service: Cardiovascular;  Laterality: N/A;  . TONSILLECTOMY      SOCIAL HISTORY:   Social History  Substance Use Topics  . Smoking status: Current Every Day Smoker    Packs/day: 0.50    Years: 47.00    Types: Cigarettes    Start date: 01/25/1969  . Smokeless tobacco: Never Used     Comment: Second-hand exposure through her husband & father as well.  . Alcohol use 0.0 oz/week    FAMILY HISTORY:   Family History  Problem Relation Age of Onset  . Hypertension Mother   . Aortic aneurysm Father   . Hypertension Brother   . Lung disease Neg Hx   . Rheumatologic disease Neg Hx     DRUG ALLERGIES:   Allergies  Allergen Reactions  . Ceftriaxone     Anaphylaxis, cardiac arrest  .  Rocephin [Ceftriaxone Sodium In Dextrose] Anaphylaxis    REVIEW OF SYSTEMS:   Review of Systems  Constitutional: Positive for malaise/fatigue. Negative for chills, fever and weight loss.  HENT: Negative for hearing loss and nosebleeds.   Eyes: Negative for blurred vision, double vision and pain.  Respiratory: Positive for cough, sputum production, shortness of breath and wheezing. Negative for hemoptysis.   Cardiovascular:  Positive for chest pain and leg swelling. Negative for palpitations and orthopnea.  Gastrointestinal: Negative for abdominal pain, constipation, diarrhea, nausea and vomiting.  Genitourinary: Negative for dysuria and hematuria.  Musculoskeletal: Negative for back pain, falls and myalgias.  Skin: Negative for rash.  Neurological: Positive for weakness. Negative for dizziness, tremors, sensory change, speech change, focal weakness, seizures and headaches.  Endo/Heme/Allergies: Does not bruise/bleed easily.  Psychiatric/Behavioral: Negative for depression and memory loss. The patient is not nervous/anxious.     MEDICATIONS AT HOME:   Prior to Admission medications   Medication Sig Start Date End Date Taking? Authorizing Provider  ALPRAZolam (XANAX) 0.25 MG tablet Take 0.25 mg by mouth daily.   Yes [provider]  aspirin EC 81 MG tablet Take 81 mg by mouth daily.   Yes [provider]  diltiazem (CARDIZEM) 60 MG tablet Take 1 tablet (60 mg total) by mouth as needed (1 table every 6 hours as needed for tachycardia). 10/21/16 10/21/17 Yes Bhagat, Bhavinkumar, PA  ezetimibe-simvastatin (VYTORIN) 10-20 MG tablet Take 1 tablet by mouth daily.   Yes [provider]  insulin aspart (NOVOLOG) 100 UNIT/ML injection Inject 3 Units into the skin 3 (three) times daily with meals. 10/13/16  Yes Vaughan Basta, MD  atorvastatin (LIPITOR) 80 MG tablet Take 1 tablet (80 mg total) by mouth daily at 6 PM. Patient not taking: Reported on 10/25/2016 10/13/16   Vaughan Basta, MD  diltiazem (CARDIZEM CD) 360 MG 24 hr capsule Take 1 capsule (360 mg total) by mouth daily. Patient not taking: Reported on 10/25/2016 10/21/16   Leanor Kail, PA  insulin glargine (LANTUS) 100 UNIT/ML injection Inject 0.15 mLs (15 Units total) into the skin daily. Patient not taking: Reported on 10/25/2016 10/13/16   Vaughan Basta, MD  ipratropium-albuterol (DUONEB) 0.5-2.5 (3) MG/3ML  SOLN Take 3 mLs by nebulization every 4 (four) hours as needed. Patient not taking: Reported on 10/25/2016 10/13/16   Vaughan Basta, MD  nitroGLYCERIN (NITROSTAT) 0.4 MG SL tablet Place 1 tablet (0.4 mg total) under the tongue every 5 (five) minutes x 3 doses as needed for chest pain. 10/21/16   Bhagat, Crista Luria, PA  oxyCODONE-acetaminophen (PERCOCET/ROXICET) 5-325 MG tablet Take 1 tablet by mouth every 6 (six) hours as needed for moderate pain or severe pain. Per [facility] controlled substance review no recent narcotic Rx 10/21/16   Bhagat, [place], PA     VITAL SIGNS:  Blood pressure (!) 101/55, pulse 80, temperature 98.6 F (37 C), temperature source Oral, resp. rate 20, height 5\' 3"  (1.6 m), weight 62.1 kg (137 lb), SpO2 98 %.  PHYSICAL EXAMINATION:  Physical Exam  GENERAL:  66 y.o.-year-old patient lying in the bed with no acute distress.  EYES: Pupils equal, round, reactive to light and accommodation. No scleral icterus. Extraocular muscles intact.  HEENT: Head atraumatic, normocephalic. Oropharynx and nasopharynx clear. No oropharyngeal erythema, moist oral mucosa  NECK:  Supple, no jugular venous distention. No thyroid enlargement, no tenderness.  LUNGS: bilateral wheezing and bibasilar crackles  CARDIOVASCULAR: S1, S2 normal. No murmurs, rubs, or gallops.  ABDOMEN: Soft, nontender, nondistended. Bowel sounds present.  No organomegaly or mass.  EXTREMITIES: No pedal edema, cyanosis, or clubbing. + 2 pedal & radial pulses b/l.   NEUROLOGIC: Cranial nerves II through XII are intact. No focal Motor or sensory deficits appreciated b/l PSYCHIATRIC: The patient is alert and oriented x 3. Good affect.  SKIN: No obvious rash, lesion, or ulcer.   LABORATORY PANEL:   CBC  Recent Labs Lab 10/25/16 1441  WBC 17.6*  HGB 10.2*  HCT 30.4*  PLT 555*   ------------------------------------------------------------------------------------------------------------------  Chemistries    Recent Labs Lab 10/19/16 0322 10/25/16 1441  NA  --  127*  K  --  3.5  CL  --  90*  CO2  --  25  GLUCOSE  --  187*  BUN  --  13  CREATININE  --  0.52  CALCIUM  --  8.8*  MG 1.6*  --   AST  --  47*  ALT  --  38  ALKPHOS  --  109  BILITOT  --  2.0*   ------------------------------------------------------------------------------------------------------------------  Cardiac Enzymes  Recent Labs Lab 10/25/16 1441  TROPONINI 0.03*   ------------------------------------------------------------------------------------------------------------------  RADIOLOGY:  Ct Angio Chest Pe W Or Wo Contrast  Result Date: 10/25/2016 CLINICAL DATA:  Pt arrived POV for reports of SOB and cough. Pt was seen here recently and had anaphylactic reaction to Rocephin. Pt room air SpO2 84%, pt placed on 3L O2 via [place] and SpO2 increased to 92%. Pt reports cough. Bilateral wheezes heard at lung bases EXAM: CT ANGIOGRAPHY CHEST WITH CONTRAST TECHNIQUE: Multidetector CT imaging of the chest was performed using the standard protocol during bolus administration of intravenous contrast. Multiplanar CT image reconstructions and MIPs were obtained to evaluate the vascular anatomy. CONTRAST:  75 cc Isovue 370 COMPARISON:  Chest x-ray 10/25/2016 FINDINGS: Cardiovascular: Pulmonary arteries are well opacified. There is no acute pulmonary embolus. There is extensive 3 vessel coronary artery calcification. Pericardial effusion is present, measuring approximately 11 mm in width. Pericardial effusion appears higher attenuation than simple water density and may contain proteinaceous material, debris, purulent, or bloody fluid. There is extensive atherosclerotic calcification of the thoracic aorta not associated with aneurysm. Mediastinum/Nodes: The visualized portion of the thyroid gland has a normal appearance. Esophagus is normal. Small hiatal hernia. No significant mediastinal, hilar, or axillary adenopathy. Lungs/Pleura:  There are numerous ground-glass opacities throughout the lungs bilaterally, primarily involving the upper lobes but also involving the left lower lobe. There is relative sparing of this opacity in the right lower lobe. There is atelectasis and right lower lobe obstruction by a mucus plug or soft tissue mass. There is bibasilar atelectasis. No significant pleural effusion. Upper Abdomen: Unremarkable. Musculoskeletal: There are Schmorl's nodes at numerous thoracic vertebral levels. Numerous bilateral rib fractures are identified, most of which are probably chronic. Old rib fractures are identified involving at least ribs 2 to through 7. Old right rib fractures are identified, involving at least ribs 2 through 6. Right fifth rib fracture appears acute. Correlation with history of trauma is recommended. Review of the MIP images confirms the above findings. IMPRESSION: 1. Technically adequate exam showing no acute pulmonary embolus. 2. Diffuse parenchymal alveolar infiltrates compatible with infectious or inflammatory process. 3. Right lower lobe bronchus is obstructed by a mucus plug or soft tissue mass. There is right lower lobe atelectasis and relative sparing of airspace filling opacities in the right lower lobe. 4. Cardiomegaly, extensive coronary artery disease, and high attenuation pericardial effusion. Considerations include hemopericardium, pericarditis, or other high attenuation material such  as proteinaceous fluid. 5. Small hiatal hernia. 6. Multiple bilateral rib fractures. Right fifth rib fracture appears acute. Correlation with history of trauma is indicated. Electronically Signed   By: Nolon Nations M.D.   On: 10/25/2016 16:21   Dg Chest Port 1 View  Result Date: 10/25/2016 CLINICAL DATA:  reports of SOB and cough. Pt was seen here recently and had anaphylactic reaction to rocephin last week, history of CAD, NSTEMI, diabetes, ventricular fibrillation EXAM: PORTABLE CHEST 1 VIEW COMPARISON:   10/19/2016 FINDINGS: There is elevation of the RIGHT hemidiaphragm not changed from 10/19/2016 but new from 10/11/2016. Normal cardiac silhouette. Lungs relatively clear. Mild RIGHT basilar atelectasis. No acute osseous abnormality. IMPRESSION: Elevation of the RIGHT hemidiaphragm is new from 10/11/2016 with differential including subpulmonic effusion versus potential diaphragmatic paralysis. Electronically Signed   By: Suzy Bouchard M.D.   On: 10/25/2016 15:11     IMPRESSION AND PLAN:   * Acute hypoxic respiratory failure due to bilateral pneumonitis and COPD exacerbation -IV steroids, Antibiotics - Scheduled Nebulizers - Inhalers -Wean O2 as tolerated - Consult pulmonary if no improvement  * Pericardial effusion. Concern for hemopericardium.no hypotension or tachycardia. We'll check echocardiogram. Discussed with Dr. Saunders Revel  Cardiology.  * diabetes mellitus. Continue pre-meal insulin. Added sliding scale insulin. Blood sugars likely will be uncontrolled due to steroid use.  * CAD with multivessel disease. Not a good candidate for CABG due to pulmonary issues according to recent cardiothoracic. Cardiology to see patient in the hospital. Troponin normal at 0.03.  * DVT prophylaxis. SCDs ordered. No Lovenox until we rule out hemopericardium.   All the records are reviewed and case discussed with ED provider. Management plans discussed with the patient, family and they are in agreement.  CODE STATUS: FULL CODE  TOTAL TIME TAKING CARE OF THIS PATIENT: 40 minutes.   Hillary Bow R M.D on 10/25/2016 at 4:54 PM  Between 7am to 6pm - Pager - [phone]  After 6pm go to [email or web address] - password [email or web address] [facility]  Office  [phone]  CC: Primary care physician; Lenard Simmer, MD  Note: This dictation was prepared with Dragon dictation along with smaller phrase technology. Any transcriptional errors that result from this process are unintentional.

## 2016-10-25 NOTE — Progress Notes (Signed)
Patient admitted from the ED with SOB.  On 2L [place] acutely.  Instructed not to take oxygen off to go the the bathroom.  Tubing extension added to her [place]

## 2016-10-25 NOTE — ED Notes (Signed)
Pt placed on 3L [place] O2.

## 2016-10-25 NOTE — ED Notes (Signed)
Assisted pt to toilet to urinate. Pt ambulates well with assistance but states she gets very tired.

## 2016-10-26 ENCOUNTER — Encounter: Payer: Self-pay | Admitting: Nurse Practitioner

## 2016-10-26 DIAGNOSIS — J209 Acute bronchitis, unspecified: Secondary | ICD-10-CM

## 2016-10-26 DIAGNOSIS — J9809 Other diseases of bronchus, not elsewhere classified: Secondary | ICD-10-CM

## 2016-10-26 DIAGNOSIS — J441 Chronic obstructive pulmonary disease with (acute) exacerbation: Principal | ICD-10-CM

## 2016-10-26 DIAGNOSIS — D649 Anemia, unspecified: Secondary | ICD-10-CM

## 2016-10-26 DIAGNOSIS — J9811 Atelectasis: Secondary | ICD-10-CM

## 2016-10-26 DIAGNOSIS — I313 Pericardial effusion (noninflammatory): Secondary | ICD-10-CM

## 2016-10-26 DIAGNOSIS — R0602 Shortness of breath: Secondary | ICD-10-CM

## 2016-10-26 DIAGNOSIS — I25118 Atherosclerotic heart disease of native coronary artery with other forms of angina pectoris: Secondary | ICD-10-CM

## 2016-10-26 DIAGNOSIS — J440 Chronic obstructive pulmonary disease with acute lower respiratory infection: Secondary | ICD-10-CM

## 2016-10-26 DIAGNOSIS — R0609 Other forms of dyspnea: Secondary | ICD-10-CM

## 2016-10-26 DIAGNOSIS — R0902 Hypoxemia: Secondary | ICD-10-CM

## 2016-10-26 LAB — CBC
HCT: 31.2 % — ABNORMAL LOW (ref 35.0–47.0)
Hemoglobin: 10.7 g/dL — ABNORMAL LOW (ref 12.0–16.0)
MCH: 29.1 pg (ref 26.0–34.0)
MCHC: 34.1 g/dL (ref 32.0–36.0)
MCV: 85.2 fL (ref 80.0–100.0)
Platelets: 576 10*3/uL — ABNORMAL HIGH (ref 150–440)
RBC: 3.67 MIL/uL — ABNORMAL LOW (ref 3.80–5.20)
RDW: 16 % — ABNORMAL HIGH (ref 11.5–14.5)
WBC: 14.5 10*3/uL — ABNORMAL HIGH (ref 3.6–11.0)

## 2016-10-26 LAB — COMPREHENSIVE METABOLIC PANEL
ALT: 41 U/L (ref 14–54)
AST: 47 U/L — ABNORMAL HIGH (ref 15–41)
Albumin: 3.1 g/dL — ABNORMAL LOW (ref 3.5–5.0)
Alkaline Phosphatase: 112 U/L (ref 38–126)
Anion gap: 11 (ref 5–15)
BUN: 15 mg/dL (ref 6–20)
CO2: 23 mmol/L (ref 22–32)
Calcium: 8.4 mg/dL — ABNORMAL LOW (ref 8.9–10.3)
Chloride: 95 mmol/L — ABNORMAL LOW (ref 101–111)
Creatinine, Ser: 0.51 mg/dL (ref 0.44–1.00)
GFR calc Af Amer: >60 mL/min (ref >60)
GFR calc non Af Amer: >60 mL/min (ref >60)
Glucose, Bld: 237 mg/dL — ABNORMAL HIGH (ref 65–99)
Potassium: 4.1 mmol/L (ref 3.5–5.1)
Sodium: 129 mmol/L — ABNORMAL LOW (ref 135–145)
Total Bilirubin: 1.4 mg/dL — ABNORMAL HIGH (ref 0.3–1.2)
Total Protein: 7 g/dL (ref 6.5–8.1)

## 2016-10-26 LAB — MAGNESIUM: Magnesium: 1.5 mg/dL — ABNORMAL LOW (ref 1.7–2.4)

## 2016-10-26 LAB — GLUCOSE, CAPILLARY
Glucose-Capillary: 185 mg/dL — ABNORMAL HIGH (ref 65–99)
Glucose-Capillary: 432 mg/dL — ABNORMAL HIGH (ref 65–99)
Glucose-Capillary: 318 mg/dL — ABNORMAL HIGH (ref 65–99)

## 2016-10-26 LAB — ECHOCARDIOGRAM COMPLETE
HEIGHTINCHES: 63 in
WEIGHTICAEL: 2192 [oz_av]

## 2016-10-26 MED ORDER — DILTIAZEM HCL 25 MG/5ML IV SOLN
5.0000 mg | Freq: Once | INTRAVENOUS | Status: AC
  Administered 2016-10-26: 5 mg via INTRAVENOUS
  Filled 2016-10-26: qty 5

## 2016-10-26 MED ORDER — IPRATROPIUM BROMIDE 0.02 % IN SOLN: 0.5000 mg | Freq: Four times a day (QID) | RESPIRATORY_TRACT | Status: DC

## 2016-10-26 MED ORDER — ADULT MULTIVITAMIN W/MINERALS CH
1.0000 | ORAL_TABLET | Freq: Every day | ORAL | Status: DC
  Administered 2016-10-26 – 2016-10-30 (×5): 1 via ORAL
  Filled 2016-10-26 (×5): qty 1

## 2016-10-26 MED ORDER — LEVALBUTEROL HCL 0.63 MG/3ML IN NEBU: 0.6300 mg | INHALATION_SOLUTION | Freq: Four times a day (QID) | RESPIRATORY_TRACT | Status: DC | PRN

## 2016-10-26 MED ORDER — SODIUM CHLORIDE 3 % IN NEBU
4.0000 mL | INHALATION_SOLUTION | Freq: Three times a day (TID) | RESPIRATORY_TRACT | Status: AC
  Administered 2016-10-26 – 2016-10-29 (×6): 4 mL via RESPIRATORY_TRACT
  Filled 2016-10-26: qty 4
  Filled 2016-10-26 (×7): qty 15
  Filled 2016-10-26: qty 4

## 2016-10-26 MED ORDER — MAGNESIUM SULFATE 2 GM/50ML IV SOLN
2.0000 g | Freq: Once | INTRAVENOUS | Status: AC
  Administered 2016-10-26: 2 g via INTRAVENOUS
  Filled 2016-10-26: qty 50

## 2016-10-26 MED ORDER — FUROSEMIDE 10 MG/ML IJ SOLN
20.0000 mg | Freq: Two times a day (BID) | INTRAMUSCULAR | Status: DC
  Administered 2016-10-26 – 2016-10-28 (×4): 20 mg via INTRAVENOUS
  Filled 2016-10-26 (×4): qty 2

## 2016-10-26 MED ORDER — ATORVASTATIN CALCIUM 10 MG PO TABS
10.0000 mg | ORAL_TABLET | Freq: Every day | ORAL | Status: DC
  Administered 2016-10-26 – 2016-10-29 (×4): 10 mg via ORAL
  Filled 2016-10-26 (×4): qty 1

## 2016-10-26 MED ORDER — GUAIFENESIN ER 600 MG PO TB12
600.0000 mg | ORAL_TABLET | Freq: Two times a day (BID) | ORAL | Status: DC
  Administered 2016-10-26 – 2016-10-30 (×9): 600 mg via ORAL
  Filled 2016-10-26 (×9): qty 1

## 2016-10-26 MED ORDER — GLUCERNA SHAKE PO LIQD
237.0000 mL | Freq: Three times a day (TID) | ORAL | Status: DC
  Administered 2016-10-26 – 2016-10-30 (×3): 237 mL via ORAL

## 2016-10-26 MED ORDER — DILTIAZEM HCL 30 MG PO TABS
30.0000 mg | ORAL_TABLET | Freq: Four times a day (QID) | ORAL | Status: DC
  Administered 2016-10-26 – 2016-10-28 (×7): 30 mg via ORAL
  Filled 2016-10-26 (×8): qty 1

## 2016-10-26 MED ORDER — SODIUM CHLORIDE 0.9% FLUSH
3.0000 mL | Freq: Two times a day (BID) | INTRAVENOUS | Status: DC
  Administered 2016-10-26 – 2016-10-30 (×7): 3 mL via INTRAVENOUS

## 2016-10-26 MED ORDER — IPRATROPIUM BROMIDE 0.02 % IN SOLN: 0.5000 mg | Freq: Four times a day (QID) | RESPIRATORY_TRACT | Status: DC | PRN

## 2016-10-26 NOTE — Progress Notes (Signed)
[place] at [facility]                                                                                                                                                                                  Patient Demographics   Lisa [name], is a 66 y.o. female, DOB - Mar 23, 1950, KYH:[id number]  Admit date - 10/25/2016   Admitting Physician Hillary Bow, MD  Outpatient Primary MD for the patient is Morayati, Lourdes Sledge, MD   LOS - 1  Subjective: Patient was recently transferred to [facility] after V. Fib arrest. She had 45 minutes of CPR. Now readmitted with acute respiratory failure here Patient reports that her breathing is improved.    Review of Systems:   CONSTITUTIONAL: No documented fever. No fatigue, weakness. No weight gain, no weight loss.  EYES: No blurry or double vision.  ENT: No tinnitus. No postnasal drip. No redness of the oropharynx.  RESPIRATORY: positive cough, no wheeze, no hemoptysis.positive dyspnea.  CARDIOVASCULAR: No chest pain. No orthopnea. No palpitations. No syncope.  GASTROINTESTINAL: No nausea, no vomiting or diarrhea. No abdominal pain. No melena or hematochezia.  GENITOURINARY: No dysuria or hematuria.  ENDOCRINE: No polyuria or nocturia. No heat or cold intolerance.  HEMATOLOGY: No anemia. No bruising. No bleeding.  INTEGUMENTARY: No rashes. No lesions.  MUSCULOSKELETAL: No arthritis. No swelling. No gout.  NEUROLOGIC: No numbness, tingling, or ataxia. No seizure-type activity.  PSYCHIATRIC: No anxiety. No insomnia. No ADD.    Vitals:   Vitals:   10/26/16 0423 10/26/16 0500 10/26/16 0802 10/26/16 1138  BP: (!) 114/46  (!) 95/52 125/61  Pulse: 96  (!) 126 96  Resp:   18 18  Temp:   98.4 F (36.9 C) 98 F (36.7 C)  TempSrc:   Oral Oral  SpO2:   98% 92%  Weight:  131 lb 6.4 oz (59.6 kg)    Height:        Wt Readings from Last 3 Encounters:  10/26/16 131 lb 6.4 oz (59.6 kg)  10/21/16 137 lb 11.2 oz (62.5 kg)   10/11/16 149 lb 0.5 oz (67.6 kg)     Intake/Output Summary (Last 24 hours) at 10/26/16 1437 Last data filed at 10/26/16 1354  Gross per 24 hour  Intake              440 ml  Output             1750 ml  Net            -1310 ml    Physical Exam:   GENERAL: Pleasant-appearing in no apparent distress.  HEAD, EYES, EARS, NOSE AND THROAT: Atraumatic, normocephalic.  Extraocular muscles are intact. Pupils equal and reactive to light. Sclerae anicteric. No conjunctival injection. No oro-pharyngeal erythema.  NECK: Supple. There is no jugular venous distention. No bruits, no lymphadenopathy, no thyromegaly.  HEART: Regular rate and rhythm,. No murmurs, no rubs, no clicks.  LUNGS: bilateral rhonchus breath sounds ABDOMEN: Soft, flat, nontender, nondistended. Has good bowel sounds. No hepatosplenomegaly appreciated.  EXTREMITIES: No evidence of any cyanosis, clubbing, or peripheral edema.  +2 pedal and radial pulses bilaterally.  NEUROLOGIC: The patient is alert, awake, and oriented x3 with no focal motor or sensory deficits appreciated bilaterally.  SKIN: Moist and warm with no rashes appreciated.  Psych: Not anxious, depressed LN: No inguinal LN enlargement    Antibiotics   Anti-infectives    Start     Dose/Rate Route Frequency Ordered Stop   10/25/16 1700  levofloxacin (LEVAQUIN) IVPB 750 mg     750 mg 100 mL/hr over 90 Minutes Intravenous Every 24 hours 10/25/16 1646        Medications   Scheduled Meds: . ALPRAZolam  0.25 mg Oral Daily  . aspirin EC  81 mg Oral Daily  . atorvastatin  10 mg Oral q1800  . diltiazem  30 mg Oral Q6H  . ezetimibe  10 mg Oral Daily  . feeding supplement (GLUCERNA SHAKE)  237 mL Oral TID BM  . insulin pump   Subcutaneous TID AC, HS, 0200  . methylPREDNISolone (SOLU-MEDROL) injection  60 mg Intravenous Q24H   Continuous Infusions: . levofloxacin (LEVAQUIN) IV Stopped (10/25/16 2210)   PRN Meds:.acetaminophen **OR** acetaminophen, ipratropium,  levalbuterol, ondansetron **OR** ondansetron (ZOFRAN) IV, oxyCODONE, polyethylene glycol   Data Review:   Micro Results Recent Results (from the past 240 hour(s))  Blood Culture (routine x 2)     Status: None (Preliminary result)   Collection Time: 10/25/16  4:34 PM  Result Value Ref Range Status   Specimen Description BLOOD LEFT ANTECUBITAL  Final   Special Requests   Final    BOTTLES DRAWN AEROBIC AND ANAEROBIC Blood Culture adequate volume   Culture NO GROWTH < 12 HOURS  Final   Report Status PENDING  Incomplete  Blood Culture (routine x 2)     Status: None (Preliminary result)   Collection Time: 10/25/16  4:34 PM  Result Value Ref Range Status   Specimen Description BLOOD RIGHT ANTECUBITAL  Final   Special Requests   Final    BOTTLES DRAWN AEROBIC AND ANAEROBIC Blood Culture adequate volume   Culture NO GROWTH < 12 HOURS  Final   Report Status PENDING  Incomplete    Radiology Reports Dg Chest 1 View  Result Date: 10/11/2016 CLINICAL DATA:  ET and NG tube placement. EXAM: CHEST 1 VIEW COMPARISON:  Chest x-ray from same day. FINDINGS: Unchanged positioning of the endotracheal tube with the tip approximately 3.9 cm above the level of the carina. New enteric tube in place with the tip and distal side port in the gastric body. The cardiomediastinal silhouette is normal in size. No focal consolidation, pleural effusion, or pneumothorax. No acute osseous abnormality. IMPRESSION: 1. Interval placement of an enteric tube with the tip and distal side port in the gastric body. 2.  No active cardiopulmonary disease. Electronically Signed   By: Titus Dubin M.D.   On: 10/11/2016 13:32   Dg Chest 1 View  Result Date: 10/11/2016 CLINICAL DATA:  Post intubation, former smoker, diabetes mellitus EXAM: CHEST 1 VIEW COMPARISON:  Portable exam 1039 hours without priors for comparison FINDINGS: Tip of  endotracheal tube projects 4.8 cm above carina. External pacing leads project over chest. Normal  heart size, mediastinal contours, and pulmonary vascularity. Lungs clear. No pleural effusion or pneumothorax. Bones demineralized. IMPRESSION: No acute abnormalities. Electronically Signed   By: Lavonia Dana M.D.   On: 10/11/2016 11:52   Ct Angio Chest Pe W Or Wo Contrast  Result Date: 10/25/2016 CLINICAL DATA:  Pt arrived POV for reports of SOB and cough. Pt was seen here recently and had anaphylactic reaction to Rocephin. Pt room air SpO2 84%, pt placed on 3L O2 via [place] and SpO2 increased to 92%. Pt reports cough. Bilateral wheezes heard at lung bases EXAM: CT ANGIOGRAPHY CHEST WITH CONTRAST TECHNIQUE: Multidetector CT imaging of the chest was performed using the standard protocol during bolus administration of intravenous contrast. Multiplanar CT image reconstructions and MIPs were obtained to evaluate the vascular anatomy. CONTRAST:  75 cc Isovue 370 COMPARISON:  Chest x-ray 10/25/2016 FINDINGS: Cardiovascular: Pulmonary arteries are well opacified. There is no acute pulmonary embolus. There is extensive 3 vessel coronary artery calcification. Pericardial effusion is present, measuring approximately 11 mm in width. Pericardial effusion appears higher attenuation than simple water density and may contain proteinaceous material, debris, purulent, or bloody fluid. There is extensive atherosclerotic calcification of the thoracic aorta not associated with aneurysm. Mediastinum/Nodes: The visualized portion of the thyroid gland has a normal appearance. Esophagus is normal. Small hiatal hernia. No significant mediastinal, hilar, or axillary adenopathy. Lungs/Pleura: There are numerous ground-glass opacities throughout the lungs bilaterally, primarily involving the upper lobes but also involving the left lower lobe. There is relative sparing of this opacity in the right lower lobe. There is atelectasis and right lower lobe obstruction by a mucus plug or soft tissue mass. There is bibasilar atelectasis. No significant  pleural effusion. Upper Abdomen: Unremarkable. Musculoskeletal: There are Schmorl's nodes at numerous thoracic vertebral levels. Numerous bilateral rib fractures are identified, most of which are probably chronic. Old rib fractures are identified involving at least ribs 2 to through 7. Old right rib fractures are identified, involving at least ribs 2 through 6. Right fifth rib fracture appears acute. Correlation with history of trauma is recommended. Review of the MIP images confirms the above findings. IMPRESSION: 1. Technically adequate exam showing no acute pulmonary embolus. 2. Diffuse parenchymal alveolar infiltrates compatible with infectious or inflammatory process. 3. Right lower lobe bronchus is obstructed by a mucus plug or soft tissue mass. There is right lower lobe atelectasis and relative sparing of airspace filling opacities in the right lower lobe. 4. Cardiomegaly, extensive coronary artery disease, and high attenuation pericardial effusion. Considerations include hemopericardium, pericarditis, or other high attenuation material such as proteinaceous fluid. 5. Small hiatal hernia. 6. Multiple bilateral rib fractures. Right fifth rib fracture appears acute. Correlation with history of trauma is indicated. Electronically Signed   By: Nolon Nations M.D.   On: 10/25/2016 16:21   US Renal  Result Date: 10/14/2016 CLINICAL DATA:  Kidney stone.  History of cardiac arrest. EXAM: RENAL / URINARY TRACT ULTRASOUND COMPLETE COMPARISON:  Abdominal CT 10/11/2016 FINDINGS: Right Kidney: Length: 11.5 cm. 2.2 cm hilar cyst. No hydronephrosis. Borderline hyperechoic renal cortex. Left Kidney: Length: 12.5 cm. Mild hydronephrosis, improved from CT 3 days ago. No evidence of mass. Bladder: Decompressed by Foley catheter. IMPRESSION: Mild left hydronephrosis, improved from CT 3 days ago. Electronically Signed   By: Monte Fantasia M.D.   On: 10/14/2016 09:05   Ct Femur Right Wo Contrast  Result Date:  10/16/2016 CLINICAL  DATA:  Right thigh swelling. Raising vascular catheterization. EXAM: CT OF THE LOWER RIGHT EXTREMITY WITHOUT CONTRAST TECHNIQUE: Multidetector CT imaging of the right lower extremity was performed according to the standard protocol. COMPARISON:  CT pelvis 10/11/2016 FINDINGS: Bones/Joint/Cartilage Degenerative arthropathy of both hips. Small bilateral knee joint effusions. Ligaments Suboptimally assessed by CT. Muscles and Tendons There is no focal hematoma around the common femoral vasculature, but there is considerable high density expansion of the right hip adductor musculature, most notably the pectineus, adductor brevis, and adductor magnus muscles. Comparing to the contralateral side, the volume of adductor hematoma on the right is estimated at 420 cubic cm (I obtain this by subtracting the volume of the normal left-sided muscles from the volume of the expanded right-sided adductor musculature). There are some lower density regions within the musculature likely representing mixed density hematoma. Infection and abscess is considered less likely. Soft tissues Subcutaneous edema noted in the right thigh. Edema tracks along deep fascia planes of the medial compartment of the right thigh. Does Foley catheter in the urinary bladder. IMPRESSION: 1. Expansion and high density in the right hip adductor musculature as detailed above, volume of suspected hematoma at 420 cubic cm. There is edema in surrounding fascia planes and in the subcutaneous tissues of the right thigh. 2. No hematoma is observed along the common femoral vasculature directly. This raises suspicion that the right adductor hematoma is spontaneous. 3. Degenerative arthropathy of both hips. 4. Small bilateral knee joint effusions. Electronically Signed   By: Van Clines M.D.   On: 10/16/2016 14:03   Dg Chest Port 1 View  Result Date: 10/25/2016 CLINICAL DATA:  reports of SOB and cough. Pt was seen here recently and had  anaphylactic reaction to rocephin last week, history of CAD, NSTEMI, diabetes, ventricular fibrillation EXAM: PORTABLE CHEST 1 VIEW COMPARISON:  10/19/2016 FINDINGS: There is elevation of the RIGHT hemidiaphragm not changed from 10/19/2016 but new from 10/11/2016. Normal cardiac silhouette. Lungs relatively clear. Mild RIGHT basilar atelectasis. No acute osseous abnormality. IMPRESSION: Elevation of the RIGHT hemidiaphragm is new from 10/11/2016 with differential including subpulmonic effusion versus potential diaphragmatic paralysis. Electronically Signed   By: Suzy Bouchard M.D.   On: 10/25/2016 15:11   Dg Chest Port 1 View  Result Date: 10/19/2016 CLINICAL DATA:  Acute respiratory failure, hypoxia EXAM: PORTABLE CHEST 1 VIEW COMPARISON:  10/18/2016 FINDINGS: Right base atelectasis with mildly elevated right hemidiaphragm. Cardiomegaly. Left lung is clear. No effusions or acute bony abnormality. IMPRESSION: Right base atelectasis.  Mild cardiomegaly. Electronically Signed   By: Rolm Baptise M.D.   On: 10/19/2016 08:42   Dg Chest Port 1 View  Result Date: 10/18/2016 CLINICAL DATA:  Acute respiratory failure EXAM: PORTABLE CHEST 1 VIEW COMPARISON:  10/17/2016 FINDINGS: Cardiac shadow is mildly enlarged but stable. Aortic calcifications are again seen. Lungs are well-aerated with right basilar atelectatic changes. Some mild interstitial changes are seen particularly on the left but stable. No new focal abnormality is seen. IMPRESSION: No significant interval change from the prior exam. Electronically Signed   By: Inez Catalina M.D.   On: 10/18/2016 07:56   Dg Chest Port 1 View  Result Date: 10/17/2016 CLINICAL DATA:  Cough, acute respiratory failure, hypoxia EXAM: PORTABLE CHEST 1 VIEW COMPARISON:  10/16/2016 FINDINGS: Cardiomegaly. Diffuse left lung airspace disease, most confluent in the left upper lobe. Right basilar atelectasis or infiltrate. Findings are similar to prior study. Suspect small right  effusion. IMPRESSION: Stable bilateral airspace opacities, left greater than right. Suspect  small effusion on the right. No real change. Electronically Signed   By: Rolm Baptise M.D.   On: 10/17/2016 07:46   Dg Chest Port 1 View  Result Date: 10/16/2016 CLINICAL DATA:  Shortness of breath. EXAM: PORTABLE CHEST 1 VIEW COMPARISON:  October 15, 2016 FINDINGS: Left greater than right pulmonary opacities persist, improved in the interval. No other interval changes. IMPRESSION: Improving but persistent left greater than right pulmonary opacities. Electronically Signed   By: Dorise Bullion III M.D   On: 10/16/2016 08:40   Dg Chest Port 1 View  Result Date: 10/15/2016 CLINICAL DATA:  Followup acute respiratory failure. EXAM: PORTABLE CHEST 1 VIEW COMPARISON:  10/14/2016 FINDINGS: Widespread bilateral pneumonia persists, with slight radiographic improvement. Small amount of a fusion and focal volume loss in the right lower lobe. No worsening or new findings. IMPRESSION: Radiographic improvement of diffuse pneumonia. Some persistent volume loss at the right base. Electronically Signed   By: Nelson Chimes M.D.   On: 10/15/2016 07:49   Dg Chest Port 1 View  Result Date: 10/14/2016 CLINICAL DATA:  66 y/o  F; respiratory distress. EXAM: PORTABLE CHEST 1 VIEW COMPARISON:  10/13/2016 chest radiograph FINDINGS: Asymmetric consolidations of the lungs are stable from prior chest radiographs. Small to moderate right pleural effusion. Stable cardiac silhouette within normal limits. Aortic atherosclerosis with calcification. No acute osseous abnormality. IMPRESSION: Stable asymmetric consolidations throughout the lungs and small to moderate right effusion. Differential includes multifocal pneumonia and pulmonary edema. Electronically Signed   By: Kristine Garbe M.D.   On: 10/14/2016 01:30   Dg Chest Port 1 View  Result Date: 10/13/2016 CLINICAL DATA:  Shortness of breath today EXAM: PORTABLE CHEST 1 VIEW  COMPARISON:  Earlier today FINDINGS: Continued progression of bilateral airspace disease, asymmetric to the left. Normal heart size for technique. Negative mediastinal contours. Small if any pleural effusions. No pneumothorax. IMPRESSION: Progression of asymmetric bilateral airspace disease favoring pneumonia or noncardiogenic edema (was there aspiration at time of cardiac arrest?). Electronically Signed   By: Monte Fantasia M.D.   On: 10/13/2016 13:44   Dg Chest Port 1 View  Result Date: 10/13/2016 CLINICAL DATA:  Respiratory failure. EXAM: PORTABLE CHEST 1 VIEW COMPARISON:  10/11/2016. FINDINGS: Interim removal of endotracheal tube and NG tube. Heart size normal. New onset of diffuse bilateral pulmonary interstitial prominence noted. Findings consistent with a process such as interstitial pneumonitis or interstitial edema. Small right pleural effusion. No pneumothorax . IMPRESSION: 1.  Interim removal of endotracheal tube and NG tube. 2. New onset of diffuse bilateral pulmonary interstitial prominence consistent with a process such as interstitial pneumonitis or interstitial edema. Small right pleural effusion. Electronically Signed   By: Marcello Moores  Register   On: 10/13/2016 06:19   Dg Abd Portable 1v  Result Date: 10/14/2016 CLINICAL DATA:  Ureteral calculus, abdominal pain EXAM: PORTABLE ABDOMEN - 1 VIEW COMPARISON:  CT abdomen and pelvis 10/11/2016 FINDINGS: LEFT pelvic phleboliths again identified. Distal LEFT ureteral calculus seen on prior CT exam is not visualized on current study. RIGHT femoral line noted. Bowel gas pattern normal. Diffuse osseous demineralization. IMPRESSION: Previously identified distal LEFT ureteral calculus is no longer seen. Electronically Signed   By: Lavonia Dana M.D.   On: 10/14/2016 07:40   Ct Renal Stone Study  Result Date: 10/11/2016 CLINICAL DATA:  Left flank pain.  History of renal stones. EXAM: CT ABDOMEN AND PELVIS WITHOUT CONTRAST TECHNIQUE: Multidetector CT  imaging of the abdomen and pelvis was performed following the standard protocol without  IV contrast. COMPARISON:  None. FINDINGS: Lower chest: Minimal atelectasis in the lung bases. No pleural effusion. Three-vessel coronary artery atherosclerosis. Normal heart size. No pericardial effusion. Hepatobiliary: No focal liver abnormality is seen. No gallstones, gallbladder wall thickening, or biliary dilatation. Pancreas: Mildly truncated appearance of the pancreatic tail. No ductal dilatation or surrounding inflammatory changes. Spleen: Unremarkable. Adrenals/Urinary Tract: Unremarkable adrenal glands. Punctate nonobstructing calculus in the lower pole of the right kidney. Three punctate nonobstructing left renal calculi. 4 mm obstructing calculus in the distal left ureter near the UVJ resulting in mild hydroureteronephrosis. Mild left perinephric stranding. Unremarkable bladder. Stomach/Bowel: Small sliding hiatal hernia. No evidence of bowel obstruction or inflammation. Unremarkable appendix. Vascular/Lymphatic: Extensive atherosclerosis of the abdominal aorta with mild infrarenal aortic ectasia measuring up to 2.8 cm diameter. Central displacement of intimal calcification more distally in the aorta over a length of 2.5 cm suggests a short segment dissection. No enlarged lymph nodes. Reproductive: 9 mm calcification in the uterine fundus likely reflecting a small fibroid. Unremarkable ovaries. Other: No intraperitoneal free fluid. Small fat containing umbilical hernia. Musculoskeletal: Mild L4 and L5 superior endplate compression fractures, chronic in appearance. Mild spondylosis and moderate posterior element hypertrophy in the lower lumbar spine with likely mild spinal stenosis at L3-4 and L4-5. IMPRESSION: 1. 4 mm distal left ureteral calculus with mild hydroureteronephrosis. 2. Punctate nonobstructing bilateral renal calculi. 3. Small hiatal hernia. 4. Aortic Atherosclerosis (ICD10-I70.0). Mild infrarenal aortic  ectasia and suspected short segment aortic dissection. Electronically Signed   By: Logan Bores M.D.   On: 10/11/2016 08:20     CBC  Recent Labs Lab 10/20/16 0415 10/25/16 1441 10/26/16 0400  WBC 24.6* 17.6* 14.5*  HGB 10.4* 10.2* 10.7*  HCT 30.9* 30.4* 31.2*  PLT 476* 555* 576*  MCV 85.1 85.2 85.2  MCH 28.7 28.5 29.1  MCHC 33.7 33.5 34.1  RDW 15.0 16.2* 16.0*    Chemistries   Recent Labs Lab 10/25/16 1441 10/26/16 0400  NA 127* 129*  K 3.5 4.1  CL 90* 95*  CO2 25 23  GLUCOSE 187* 237*  BUN 13 15  CREATININE 0.52 0.51  CALCIUM 8.8* 8.4*  MG 1.6* 1.5*  AST 47* 47*  ALT 38 41  ALKPHOS 109 112  BILITOT 2.0* 1.4*   ------------------------------------------------------------------------------------------------------------------ estimated creatinine clearance is 58.8 mL/min (by C-G formula based on SCr of 0.51 mg/dL). ------------------------------------------------------------------------------------------------------------------ No results for input(s): HGBA1C in the last 72 hours. ------------------------------------------------------------------------------------------------------------------ No results for input(s): CHOL, HDL, LDLCALC, TRIG, CHOLHDL, LDLDIRECT in the last 72 hours. ------------------------------------------------------------------------------------------------------------------ No results for input(s): TSH, T4TOTAL, T3FREE, THYROIDAB in the last 72 hours.  Invalid input(s): FREET3 ------------------------------------------------------------------------------------------------------------------ No results for input(s): VITAMINB12, FOLATE, FERRITIN, TIBC, IRON, RETICCTPCT in the last 72 hours.  Coagulation profile No results for input(s): INR, PROTIME in the last 168 hours.  No results for input(s): DDIMER in the last 72 hours.  Cardiac Enzymes  Recent Labs Lab 10/25/16 1441  TROPONINI 0.03*    ------------------------------------------------------------------------------------------------------------------ Invalid input(s): POCBNP    Assessment & Plan  Patient is a 67 year old with recent CPR  Acute hypoxic respiratory failure due to bilateral pneumonitis and COPD exacerbation -Continue IV steroids, Antibiotics - Scheduled Nebulizers - Inhalers --due to CT scan showing possible mucus plugging versus mass on the bronchus pulmonary has been consulted  * Pericardial effusion. Concern for hemopericardium.no hypotension or tachycardia. Echocardiogram pending cardiology is following  * diabetes mellitus. Continue insulin pump as doing at home,   * CAD with multivessel disease. Not a good candidate  for CABG due to pulmonary issues according to recent cardiothoracic. Cardiology following Continue aspirin  * atrial fibrillation now in normal sinus rhythm continue Cardizem  *hyperlipidemia unspecified continue Lipitor  * DVT prophylaxis. SCDs       Code Status Orders        Start     Ordered   10/25/16 1649  Full code  Continuous     10/25/16 1651    Code Status History    Date Active Date Inactive Code Status Order ID Comments User Context   10/13/2016 11:29 AM 10/21/2016  6:46 PM Full Code [id number]  Rise Mu, PA-C Inpatient   10/11/2016 12:26 PM 10/13/2016 10:48 AM Full Code [id number]  Nelva Bush, MD Inpatient   10/11/2016 11:15 AM 10/11/2016 12:26 PM Full Code [id number]  Wilhelmina Mcardle, MD Inpatient           Consultscardiology  DVT Prophylaxis  SCDs  Lab Results  Component Value Date   PLT 576 (H) 10/26/2016     Time Spent in minutes   6min  Greater than 50% of time spent in care coordination and counseling patient regarding the condition and plan of care.   Dustin Flock M.D on 10/26/2016 at 2:37 PM  Between 7am to 6pm - Pager - [phone]  After 6pm go to [email or web address] - password [email or web address] [facility]    Office  [phone]

## 2016-10-26 NOTE — Progress Notes (Signed)
45: Dr. Marcille Blanco informed of change in patient's rythym from NSR to afb/RVR in 130-150. Dr. Marcille Blanco attended to patient at bedside. 5mg  of Diltiazem was administered twiceand 2g of IV magnesium per odered. Stat lab was drawn. Patient remained in afib with HR in 80-100

## 2016-10-26 NOTE — Progress Notes (Signed)
Cardiology Consult    Patient ID: KIDADA GING MRN: [id number], DOB/AGE: 1950-10-10   Admit date: 10/25/2016 Date of Consult: 10/26/2016  Primary Physician: Lenard Simmer, MD Primary Cardiologist: Andree Coss, MD  Requesting Provider: Chauncey Cruel. Patel  Patient Profile    Lisa [name] is a 66 y.o. female with a history of CAD s/p VF arrest in the setting of anaphylaxis (Rocephin for UTI), DM I, PVD/AAA, recent spontaneous groin hematoma w/ anemia req PRBCs, and nephrolithiasis who is being seen today for the evaluation of dyspnea at the request of Dr. Posey Pronto.  Past Medical History   Past Medical History:  Diagnosis Date  . CAD (coronary artery disease)    a. 1999 s/p PTCA of "small vessel" @ [facility];  b. Biannual stress tests w/ PCP, reportedly nl;  c. NSTEMI/Cath 10/11/2016: LM 20%, oLAD 40%, p-mLAD 40% eccentric/calcified, dLAD 40%, p-mLCx 95%, eccentric, sev calcified, pRCA 30%, eccentric, sev calcified, mRCA lesion-1 90% focal & eccentric, sev calcified, mRCA lesion-2 99% sev calcified, EF 55-65%; d. 10/2016 CT Surgery->rec PCI due to poor lung fxn.  . Groin hematoma    a. 10/2016 Spont groin hematoma w/ anemia req 1u prbcs.  . Nephrolithiasis   . NSTEMI (non-ST elevated myocardial infarction) ([place]) 10/11/2016  . Pneumonia 02/2016  . PVD (peripheral vascular disease) ([place])    a. Abdominal aortogram 10/11/16: Infrarenal abdominal aorta heavily calcified & ectatic with approximately 40-50% stenosis at the bifurcation. Mild diffuse disease w/ heavy calcification noted in the common & external iliac arteries bilaterally  . Type I diabetes mellitus ([place])    a. On insulin pump.  . Ventricular fibrillation ([place])    a. 10/11/2016: Felt to be secondary to demand ischemia in the setting of underlying CAD and anaphylactic reaction from IV Rocephin; b. 10/2016 Echo: EF 55-60%, ? mild inf HK; c. 10/2016 Seen by EP: No ICD indication.    Past Surgical History:  Procedure Laterality Date  . ABDOMINAL  AORTOGRAM N/A 10/11/2016   Procedure: ABDOMINAL AORTOGRAM;  Surgeon: Nelva Bush, MD;  Location: [facility];  Service: Cardiovascular;  Laterality: N/A;  . LEFT HEART CATH AND CORONARY ANGIOGRAPHY N/A 10/11/2016   Procedure: LEFT HEART CATH AND CORONARY ANGIOGRAPHY;  Surgeon: Nelva Bush, MD;  Location: [facility];  Service: Cardiovascular;  Laterality: N/A;  . TONSILLECTOMY       Allergies  Allergies  Allergen Reactions  . Ceftriaxone     Anaphylaxis, cardiac arrest  . Rocephin [Ceftriaxone Sodium In Dextrose] Anaphylaxis  . Albuterol     Tachycardia-->Afib. Tolerates Xopenex    History of Present Illness    66 y/o ? with the above complex pmh including CAD s/p VF arrest in the setting of anaphylaxis (Rocephin for UTI), DM I, PVD/AAA, recent spontaneous groin hematoma w/ anemia req PRBCs, and nephrolithiasis.  She was recently admitted to [facility] after presenting with flank pain and nausea.  She was dx with possible UTI and treated with IV rocephin in the ED.  This was followed by bradycardia and VF arrest.  She required intubation and CPR x 45 mins w/ eventual return of spont circulation.  She was admitted to ICU and r/i for nstemi, with trop peaking @ 7.82.  Echo showed nl EF, ? Inf HK.  Cath showed severe LCX and RCA dzs along with infrarenal AAA and Ao atherosclerosis.  She was tx to [facility] for CT surgical eval.  She was felt to be a poor candidate for surgery 2/2 poor  lung fxn.  Hosp course complicated by R groin hematoma and anemia with drop in Hgb from 13 to 6.8  PRBCs x 1.  Hgb stable @ 10 afterward.  Also PAF noted.  No OAC in the setting of hematoma.  She improved slightly and was able to be weaned from O2.  She was d/c'd 9/21.  Over the weekend, pt noted DOE w/ minimal activity.  She was not having any c/p or palpitations.  Due to ongoing and progressive DOE, she presented [facility] 9/[facility].  CTA chest neg for PE but showed RLL bronchus obstruction - ? Mucous  plug vs mass, RLL opacities, and pericardial effusion concerning for hemopericardium.  Multiple rib fx also noted.  O2 sat was initially 84 on room air.  Pt was treated with one dose of IV lasix and admitted for further eval.  She remains orthopneic, though denies dyspnea @ rest.  This am, following an albuterol tx, she went back into afib assoc with tachypalps, mild chest pressure, and dyspnea.  She converted back to sinus shortly after 8 am.  Inpatient Medications    . ALPRAZolam  0.25 mg Oral Daily  . aspirin EC  81 mg Oral Daily  . ezetimibe  10 mg Oral Daily   And  . simvastatin  20 mg Oral q1800  . insulin pump   Subcutaneous TID AC, HS, 0200  . ipratropium  0.5 mg Nebulization QID  . methylPREDNISolone (SOLU-MEDROL) injection  60 mg Intravenous Q24H    Family History    Family History  Problem Relation Age of Onset  . Hypertension Mother   . Aortic aneurysm Father   . Hypertension Brother   . Lung disease Neg Hx   . Rheumatologic disease Neg Hx     Social History    Social History   Social History  . Marital status: Married    Spouse name: N/A  . Number of children: N/A  . Years of education: N/A   Occupational History  . Not on file.   Social History Main Topics  . Smoking status: Current Every Day Smoker    Packs/day: 0.50    Years: 47.00    Types: Cigarettes    Start date: 01/25/1969  . Smokeless tobacco: Never Used     Comment: Second-hand exposure through her husband & father as well.  . Alcohol use 0.0 oz/week  . Drug use: No  . Sexual activity: Not Currently   Other Topics Concern  . Not on file   Social History Narrative   [facility] (10/14/16):   Patient has primarily done office work. Married for approximately 1 year. Has a dog at home but no bird exposure. No mold exposure.     Review of Systems    General:  No chills, fever, night sweats or weight changes.  Cardiovascular:  +++ chest pain, +++ dyspnea on exertion, no edema, +++  orthopnea, +++ palpitations, no paroxysmal nocturnal dyspnea. Dermatological: No rash, lesions/masses Respiratory: No cough, +++ dyspnea Urologic: No hematuria, dysuria Abdominal:   No nausea, vomiting, diarrhea, bright red blood per rectum, melena, or hematemesis Neurologic:  No visual changes, wkns, changes in mental status. All other systems reviewed and are otherwise negative except as noted above.  Physical Exam    Blood pressure (!) 95/52, pulse (!) 126, temperature 98.4 F (36.9 C), temperature source Oral, resp. rate 18, height 5\' 3"  (1.6 m), weight 131 lb 6.4 oz (59.6 kg), SpO2 98 %.  General: Pleasant, NAD Psych:  Normal affect. Neuro: Alert and oriented X 3. Moves all extremities spontaneously. HEENT: Normal  Neck: Supple without bruits or JVD. Lungs:  Resp regular and unlabored, markedly diminished breath sounds in R base - 1/2 way up. Heart: RRR, 2/6 syst murmur @ LUSB, +Rub, no s3, s4. Abdomen: Soft, non-tender, non-distended, BS + x 4.  Extremities: No clubbing, cyanosis or edema. DP/PT/Radials 2+ and equal bilaterally. R groin is soft with minimal ecchymosis.  No bruit.  Labs     Recent Labs  10/25/16 1441  TROPONINI 0.03*   Lab Results  Component Value Date   WBC 14.5 (H) 10/26/2016   HGB 10.7 (L) 10/26/2016   HCT 31.2 (L) 10/26/2016   MCV 85.2 10/26/2016   PLT 576 (H) 10/26/2016     Recent Labs Lab 10/26/16 0400  NA 129*  K 4.1  CL 95*  CO2 23  BUN 15  CREATININE 0.51  CALCIUM 8.4*  PROT 7.0  BILITOT 1.4*  ALKPHOS 112  ALT 41  AST 47*  GLUCOSE 237*   Lab Results  Component Value Date   CHOL 81 10/13/2016   HDL 34 (L) 10/13/2016   LDLCALC 36 10/13/2016   TRIG 56 10/13/2016     Radiology Studies    Dg Chest 1 View  Result Date: 10/11/2016 CLINICAL DATA:  ET and NG tube placement. EXAM: CHEST 1 VIEW COMPARISON:  Chest x-ray from same day. FINDINGS: Unchanged positioning of the endotracheal tube with the tip approximately 3.9 cm above  the level of the carina. New enteric tube in place with the tip and distal side port in the gastric body. The cardiomediastinal silhouette is normal in size. No focal consolidation, pleural effusion, or pneumothorax. No acute osseous abnormality. IMPRESSION: 1. Interval placement of an enteric tube with the tip and distal side port in the gastric body. 2.  No active cardiopulmonary disease. Electronically Signed   By: Titus Dubin M.D.   On: 10/11/2016 13:32   Dg Chest 1 View  Result Date: 10/11/2016 CLINICAL DATA:  Post intubation, former smoker, diabetes mellitus EXAM: CHEST 1 VIEW COMPARISON:  Portable exam 1039 hours without priors for comparison FINDINGS: Tip of endotracheal tube projects 4.8 cm above carina. External pacing leads project over chest. Normal heart size, mediastinal contours, and pulmonary vascularity. Lungs clear. No pleural effusion or pneumothorax. Bones demineralized. IMPRESSION: No acute abnormalities. Electronically Signed   By: Lavonia Dana M.D.   On: 10/11/2016 11:52   Ct Angio Chest Pe W Or Wo Contrast  Result Date: 10/25/2016 CLINICAL DATA:  Pt arrived POV for reports of SOB and cough. Pt was seen here recently and had anaphylactic reaction to Rocephin. Pt room air SpO2 84%, pt placed on 3L O2 via [place] and SpO2 increased to 92%. Pt reports cough. Bilateral wheezes heard at lung bases EXAM: CT ANGIOGRAPHY CHEST WITH CONTRAST TECHNIQUE: Multidetector CT imaging of the chest was performed using the standard protocol during bolus administration of intravenous contrast. Multiplanar CT image reconstructions and MIPs were obtained to evaluate the vascular anatomy. CONTRAST:  75 cc Isovue 370 COMPARISON:  Chest x-ray 10/25/2016 FINDINGS: Cardiovascular: Pulmonary arteries are well opacified. There is no acute pulmonary embolus. There is extensive 3 vessel coronary artery calcification. Pericardial effusion is present, measuring approximately 11 mm in width. Pericardial effusion appears  higher attenuation than simple water density and may contain proteinaceous material, debris, purulent, or bloody fluid. There is extensive atherosclerotic calcification of the thoracic aorta not associated with aneurysm. Mediastinum/Nodes: The visualized portion  of the thyroid gland has a normal appearance. Esophagus is normal. Small hiatal hernia. No significant mediastinal, hilar, or axillary adenopathy. Lungs/Pleura: There are numerous ground-glass opacities throughout the lungs bilaterally, primarily involving the upper lobes but also involving the left lower lobe. There is relative sparing of this opacity in the right lower lobe. There is atelectasis and right lower lobe obstruction by a mucus plug or soft tissue mass. There is bibasilar atelectasis. No significant pleural effusion. Upper Abdomen: Unremarkable. Musculoskeletal: There are Schmorl's nodes at numerous thoracic vertebral levels. Numerous bilateral rib fractures are identified, most of which are probably chronic. Old rib fractures are identified involving at least ribs 2 to through 7. Old right rib fractures are identified, involving at least ribs 2 through 6. Right fifth rib fracture appears acute. Correlation with history of trauma is recommended. Review of the MIP images confirms the above findings. IMPRESSION: 1. Technically adequate exam showing no acute pulmonary embolus. 2. Diffuse parenchymal alveolar infiltrates compatible with infectious or inflammatory process. 3. Right lower lobe bronchus is obstructed by a mucus plug or soft tissue mass. There is right lower lobe atelectasis and relative sparing of airspace filling opacities in the right lower lobe. 4. Cardiomegaly, extensive coronary artery disease, and high attenuation pericardial effusion. Considerations include hemopericardium, pericarditis, or other high attenuation material such as proteinaceous fluid. 5. Small hiatal hernia. 6. Multiple bilateral rib fractures. Right fifth rib  fracture appears acute. Correlation with history of trauma is indicated. Electronically Signed   By: Nolon Nations M.D.   On: 10/25/2016 16:21   US Renal  Result Date: 10/14/2016 CLINICAL DATA:  Kidney stone.  History of cardiac arrest. EXAM: RENAL / URINARY TRACT ULTRASOUND COMPLETE COMPARISON:  Abdominal CT 10/11/2016 FINDINGS: Right Kidney: Length: 11.5 cm. 2.2 cm hilar cyst. No hydronephrosis. Borderline hyperechoic renal cortex. Left Kidney: Length: 12.5 cm. Mild hydronephrosis, improved from CT 3 days ago. No evidence of mass. Bladder: Decompressed by Foley catheter. IMPRESSION: Mild left hydronephrosis, improved from CT 3 days ago. Electronically Signed   By: Monte Fantasia M.D.   On: 10/14/2016 09:05   Ct Femur Right Wo Contrast  Result Date: 10/16/2016 CLINICAL DATA:  Right thigh swelling. Raising vascular catheterization. EXAM: CT OF THE LOWER RIGHT EXTREMITY WITHOUT CONTRAST TECHNIQUE: Multidetector CT imaging of the right lower extremity was performed according to the standard protocol. COMPARISON:  CT pelvis 10/11/2016 FINDINGS: Bones/Joint/Cartilage Degenerative arthropathy of both hips. Small bilateral knee joint effusions. Ligaments Suboptimally assessed by CT. Muscles and Tendons There is no focal hematoma around the common femoral vasculature, but there is considerable high density expansion of the right hip adductor musculature, most notably the pectineus, adductor brevis, and adductor magnus muscles. Comparing to the contralateral side, the volume of adductor hematoma on the right is estimated at 420 cubic cm (I obtain this by subtracting the volume of the normal left-sided muscles from the volume of the expanded right-sided adductor musculature). There are some lower density regions within the musculature likely representing mixed density hematoma. Infection and abscess is considered less likely. Soft tissues Subcutaneous edema noted in the right thigh. Edema tracks along deep fascia  planes of the medial compartment of the right thigh. Does Foley catheter in the urinary bladder. IMPRESSION: 1. Expansion and high density in the right hip adductor musculature as detailed above, volume of suspected hematoma at 420 cubic cm. There is edema in surrounding fascia planes and in the subcutaneous tissues of the right thigh. 2. No hematoma is observed along the  common femoral vasculature directly. This raises suspicion that the right adductor hematoma is spontaneous. 3. Degenerative arthropathy of both hips. 4. Small bilateral knee joint effusions. Electronically Signed   By: Van Clines M.D.   On: 10/16/2016 14:03   Dg Chest Port 1 View  Result Date: 10/25/2016 CLINICAL DATA:  reports of SOB and cough. Pt was seen here recently and had anaphylactic reaction to rocephin last week, history of CAD, NSTEMI, diabetes, ventricular fibrillation EXAM: PORTABLE CHEST 1 VIEW COMPARISON:  10/19/2016 FINDINGS: There is elevation of the RIGHT hemidiaphragm not changed from 10/19/2016 but new from 10/11/2016. Normal cardiac silhouette. Lungs relatively clear. Mild RIGHT basilar atelectasis. No acute osseous abnormality. IMPRESSION: Elevation of the RIGHT hemidiaphragm is new from 10/11/2016 with differential including subpulmonic effusion versus potential diaphragmatic paralysis. Electronically Signed   By: Suzy Bouchard M.D.   On: 10/25/2016 15:11   Dg Chest Port 1 View  Result Date: 10/19/2016 CLINICAL DATA:  Acute respiratory failure, hypoxia EXAM: PORTABLE CHEST 1 VIEW COMPARISON:  10/18/2016 FINDINGS: Right base atelectasis with mildly elevated right hemidiaphragm. Cardiomegaly. Left lung is clear. No effusions or acute bony abnormality. IMPRESSION: Right base atelectasis.  Mild cardiomegaly. Electronically Signed   By: Rolm Baptise M.D.   On: 10/19/2016 08:42   Dg Chest Port 1 View  Result Date: 10/18/2016 CLINICAL DATA:  Acute respiratory failure EXAM: PORTABLE CHEST 1 VIEW COMPARISON:   10/17/2016 FINDINGS: Cardiac shadow is mildly enlarged but stable. Aortic calcifications are again seen. Lungs are well-aerated with right basilar atelectatic changes. Some mild interstitial changes are seen particularly on the left but stable. No new focal abnormality is seen. IMPRESSION: No significant interval change from the prior exam. Electronically Signed   By: Inez Catalina M.D.   On: 10/18/2016 07:56   Dg Chest Port 1 View  Result Date: 10/17/2016 CLINICAL DATA:  Cough, acute respiratory failure, hypoxia EXAM: PORTABLE CHEST 1 VIEW COMPARISON:  10/16/2016 FINDINGS: Cardiomegaly. Diffuse left lung airspace disease, most confluent in the left upper lobe. Right basilar atelectasis or infiltrate. Findings are similar to prior study. Suspect small right effusion. IMPRESSION: Stable bilateral airspace opacities, left greater than right. Suspect small effusion on the right. No real change. Electronically Signed   By: Rolm Baptise M.D.   On: 10/17/2016 07:46   Dg Chest Port 1 View  Result Date: 10/16/2016 CLINICAL DATA:  Shortness of breath. EXAM: PORTABLE CHEST 1 VIEW COMPARISON:  October 15, 2016 FINDINGS: Left greater than right pulmonary opacities persist, improved in the interval. No other interval changes. IMPRESSION: Improving but persistent left greater than right pulmonary opacities. Electronically Signed   By: Dorise Bullion III M.D   On: 10/16/2016 08:40   Dg Chest Port 1 View  Result Date: 10/15/2016 CLINICAL DATA:  Followup acute respiratory failure. EXAM: PORTABLE CHEST 1 VIEW COMPARISON:  10/14/2016 FINDINGS: Widespread bilateral pneumonia persists, with slight radiographic improvement. Small amount of a fusion and focal volume loss in the right lower lobe. No worsening or new findings. IMPRESSION: Radiographic improvement of diffuse pneumonia. Some persistent volume loss at the right base. Electronically Signed   By: Nelson Chimes M.D.   On: 10/15/2016 07:49   Dg Chest Port 1  View  Result Date: 10/14/2016 CLINICAL DATA:  66 y/o  F; respiratory distress. EXAM: PORTABLE CHEST 1 VIEW COMPARISON:  10/13/2016 chest radiograph FINDINGS: Asymmetric consolidations of the lungs are stable from prior chest radiographs. Small to moderate right pleural effusion. Stable cardiac silhouette within normal limits. Aortic atherosclerosis  with calcification. No acute osseous abnormality. IMPRESSION: Stable asymmetric consolidations throughout the lungs and small to moderate right effusion. Differential includes multifocal pneumonia and pulmonary edema. Electronically Signed   By: Kristine Garbe M.D.   On: 10/14/2016 01:30   Dg Chest Port 1 View  Result Date: 10/13/2016 CLINICAL DATA:  Shortness of breath today EXAM: PORTABLE CHEST 1 VIEW COMPARISON:  Earlier today FINDINGS: Continued progression of bilateral airspace disease, asymmetric to the left. Normal heart size for technique. Negative mediastinal contours. Small if any pleural effusions. No pneumothorax. IMPRESSION: Progression of asymmetric bilateral airspace disease favoring pneumonia or noncardiogenic edema (was there aspiration at time of cardiac arrest?). Electronically Signed   By: Monte Fantasia M.D.   On: 10/13/2016 13:44   Dg Chest Port 1 View  Result Date: 10/13/2016 CLINICAL DATA:  Respiratory failure. EXAM: PORTABLE CHEST 1 VIEW COMPARISON:  10/11/2016. FINDINGS: Interim removal of endotracheal tube and NG tube. Heart size normal. New onset of diffuse bilateral pulmonary interstitial prominence noted. Findings consistent with a process such as interstitial pneumonitis or interstitial edema. Small right pleural effusion. No pneumothorax . IMPRESSION: 1.  Interim removal of endotracheal tube and NG tube. 2. New onset of diffuse bilateral pulmonary interstitial prominence consistent with a process such as interstitial pneumonitis or interstitial edema. Small right pleural effusion. Electronically Signed   By: Marcello Moores   Register   On: 10/13/2016 06:19   Dg Abd Portable 1v  Result Date: 10/14/2016 CLINICAL DATA:  Ureteral calculus, abdominal pain EXAM: PORTABLE ABDOMEN - 1 VIEW COMPARISON:  CT abdomen and pelvis 10/11/2016 FINDINGS: LEFT pelvic phleboliths again identified. Distal LEFT ureteral calculus seen on prior CT exam is not visualized on current study. RIGHT femoral line noted. Bowel gas pattern normal. Diffuse osseous demineralization. IMPRESSION: Previously identified distal LEFT ureteral calculus is no longer seen. Electronically Signed   By: Lavonia Dana M.D.   On: 10/14/2016 07:40   Ct Renal Stone Study  Result Date: 10/11/2016 CLINICAL DATA:  Left flank pain.  History of renal stones. EXAM: CT ABDOMEN AND PELVIS WITHOUT CONTRAST TECHNIQUE: Multidetector CT imaging of the abdomen and pelvis was performed following the standard protocol without IV contrast. COMPARISON:  None. FINDINGS: Lower chest: Minimal atelectasis in the lung bases. No pleural effusion. Three-vessel coronary artery atherosclerosis. Normal heart size. No pericardial effusion. Hepatobiliary: No focal liver abnormality is seen. No gallstones, gallbladder wall thickening, or biliary dilatation. Pancreas: Mildly truncated appearance of the pancreatic tail. No ductal dilatation or surrounding inflammatory changes. Spleen: Unremarkable. Adrenals/Urinary Tract: Unremarkable adrenal glands. Punctate nonobstructing calculus in the lower pole of the right kidney. Three punctate nonobstructing left renal calculi. 4 mm obstructing calculus in the distal left ureter near the UVJ resulting in mild hydroureteronephrosis. Mild left perinephric stranding. Unremarkable bladder. Stomach/Bowel: Small sliding hiatal hernia. No evidence of bowel obstruction or inflammation. Unremarkable appendix. Vascular/Lymphatic: Extensive atherosclerosis of the abdominal aorta with mild infrarenal aortic ectasia measuring up to 2.8 cm diameter. Central displacement of intimal  calcification more distally in the aorta over a length of 2.5 cm suggests a short segment dissection. No enlarged lymph nodes. Reproductive: 9 mm calcification in the uterine fundus likely reflecting a small fibroid. Unremarkable ovaries. Other: No intraperitoneal free fluid. Small fat containing umbilical hernia. Musculoskeletal: Mild L4 and L5 superior endplate compression fractures, chronic in appearance. Mild spondylosis and moderate posterior element hypertrophy in the lower lumbar spine with likely mild spinal stenosis at L3-4 and L4-5. IMPRESSION: 1. 4 mm distal left ureteral calculus with mild  hydroureteronephrosis. 2. Punctate nonobstructing bilateral renal calculi. 3. Small hiatal hernia. 4. Aortic Atherosclerosis (ICD10-I70.0). Mild infrarenal aortic ectasia and suspected short segment aortic dissection. Electronically Signed   By: Logan Bores M.D.   On: 10/11/2016 08:20    ECG & Cardiac Imaging    Rsr, 76, LAE, no acute st/t changes  Assessment & Plan    1.  Acute resp failure w/ hypoxia:  Recent hosp for VF arrest in the setting of anaphylactic rxn to IV rocephin  45 mins CPR, VDRF  nl EF by echo  severe LCX/RCA dzs  turned down for surgery @ [facility] due to poor lung fxn (long time smoker). D/c'd from [facility] on 9/21, worsening DOE and orthopnea over the weekend, prompting presentation 9/25.  CTA chest neg for PE but pericardial effusion and obstruction of RLL bronchus noted with ? Mucous plug vs mass.  Rub on exam.  Markedly diminished breath sounds in R base.  Echo performed last night and read pending this AM.  If significant pericardial effusion w/ tamponade phys, will need pericardiocentesis this afternoon.  Pulm consult for RLL findings - ? Need for bronch.  2.  CAD: s/p recent VF arrest and NSTEMI with severe LCX/RCA dzs.  Nl EF.  Not felt to be a good surgical candidate 2/2 poor lung fxn/COPD.  Plan was for high risk PCI/atherectomy @ some point in the near future, pending recovery from  recent event, groin hematoma, anemia.  Progressive dyspnea as above.  No c/p until this am in the setting of rapid afib  resolved.  Trop 0.03 on admission.  Cont asa, statin, zetia.  No  blocker in setting of COPD. Was on dilt @ home but on hold here as bp's soft.  Doubt presentation related to ACS.  See #1.  Defer PCI to outpt setting for now.  H/H stable over past week.  Hold off on P2Y12 inhibitor therapy for now as she may require additional procedures (? Pericardiocentesis, bronch).  3.  Pericardial Effusion:  Noted on CT with ? Of hemopericardium.  Echo read pending this AM  Dr. Rockey Situ to review.  Rub on exam.  Further recs following echo.  4.  RLL Bronchus obstruction:  Noted on CTA as above.  Pulm consult.  Long smoking hx.  5.  PAF:  Noted during last admission.  Seemed to be more likely with albuterol and occurred again in setting of albuterol, though broke spontaneously.  I have d/c'd albuterol and placed her on xopenex, which she has prev tolerated.  Will try to resume low dose dilt as bp tolerates.  No OAC at this point given recent groin bleed and need for additional procedure (at least PCI, possibly more as above).  CHA2DS2VASc = 3  will need long term Ignacio @ some point.  6.  Anemia:  Stable.  7.  Hyponatremia:  Avoid further diuresis.  8.  Leukocytosis:  Afebrile. Abx per IM.  9.  Tob Abuse/COPD:  Quit smoking as of last admission.  No wheezing.  Switching albuterol to xopenex.  Signed, Murray Hodgkins, NP 10/26/2016, 9:21 AM  For questions or updates, please contact   Please consult [email or web address] for contact info under Cardiology/STEMI.

## 2016-10-26 NOTE — Progress Notes (Signed)
Patient is hemodynamically stable this shift,seen by pulmonologist and cardiologist,elevated blood sugars,seen by diabetic nurse,insulin pump functional,iv antibiotics continue.

## 2016-10-26 NOTE — Plan of Care (Signed)
Problem: Activity: Goal: Ability to tolerate increased activity will improve Outcome: Progressing Promote activity in the chair and in the room as tolerated.

## 2016-10-26 NOTE — Care Management (Signed)
Patient admitted from home with increasing shortness of breath.  Will need to anticipate home oxygen and possibly home health follow up through [facility].  Recent anaphylaxis resulting in CV arrest.  Severe CAD and not a surgical candidate due to  pulmonary issues.  Plans are for high risk PCI when patient medical status stabilizes. Found to have pericardial effusion and may require pericardiocentesis.  Pulmonary consult is pending

## 2016-10-26 NOTE — Progress Notes (Signed)
Initial Nutrition Assessment  DOCUMENTATION CODES:   Not applicable  INTERVENTION:  1. Continue Glucerna Shake po TID, each supplement provides 220 kcal and 10 grams of protein  2. Recommend MVI w/ Minerals  NUTRITION DIAGNOSIS:   Inadequate oral intake related to acute illness (intubation, cardiac arrest, COPD exacerbation, bilateral pneumonitis) as evidenced by per patient/family report, energy intake < or equal to 50% for > or equal to 5 days.  GOAL:   Patient will meet greater than or equal to 90% of their needs  MONITOR:   PO intake, I & O's, Labs, Supplement acceptance, Weight trends  REASON FOR ASSESSMENT:   Malnutrition Screening Tool    ASSESSMENT:   Lisa [name] is a 66 yo female with PMH diabetes, CAD, recent CV arrest in setting of anaphylactic reaction to IV rocephin, discharged from [facility]/21, presents with COPD exacerbation, bilateral pneumonitis, pericardial effusion, may require pericardiocentesis.  Spoke with Lisa [name] around lunch time. She was eating a hamburger she consumed 100% of during my visit. She is unsure of recent weight loss but does report poor PO intake over the past 2 weeks due to recent acute illness. States she has been eating "almost nothing," during that time. Prior to that she would eat an egg and toast, or cereal for breakfast, would normally snack on cheese and crackers for lunch, and eat something like salmon and a salad for dinner. Per patient, she follows a diabetic diet at home. She does have a few fractured ribs from CPR/ACLS but they do not hamper her PO intake at this time per patient. Seems her appetite is improving. Per chart, she exhibits an 18 pound/12% significant weight loss over 15 days. She does exhibit some mild muscle wasting at calves, and moderate fat depletion at orbitals but physical exam is otherwise WNL.   Intake/Output Summary (Last 24 hours) at 10/26/16 1644 Last data filed at 10/26/16 1354  Gross per 24 hour   Intake              440 ml  Output             1750 ml  Net            -1310 ml   Patient requested Glucerna due to poor PO intake over the past 2 weeks, will monitor current PO intake along with supplements. Will adjust as needed. Encouraged patient to consume when her appetite is poor to maintain muscle mass and weight.  Labs reviewed:  CBGs 185, 257, 171 Na 129, Mg 1.5, Tbili 1.4,  Medications reviewed and include:  Insulin pump, Solumedrol   Diet Order:  Diet Heart Room service appropriate? Yes; Fluid consistency: Thin  Skin:  Reviewed, no issues  Last BM:  10/25/2016  Height:   Ht Readings from Last 1 Encounters:  10/25/16 5\' 3"  (1.6 m)    Weight:   Wt Readings from Last 1 Encounters:  10/26/16 131 lb 6.4 oz (59.6 kg)    Ideal Body Weight:  52.27 kg  BMI:  Body mass index is 23.28 kg/m.  Estimated Nutritional Needs:   Kcal:  1350-1600 calories (MSJ x1.2-1.3)  Protein:  77-89 grams (1.3-1.5g/kg)  Fluid:  1.4-1.6L  EDUCATION NEEDS:   Education needs addressed  Lisa [name]. Lisa Dettmann, MS, RD LDN Inpatient Clinical Dietitian Pager [phone]

## 2016-10-26 NOTE — Progress Notes (Signed)
Inpatient Diabetes Program Recommendations  AACE/ADA: New Consensus Statement on Inpatient Glycemic Control (2015)  Target Ranges:  Prepandial:   less than 140 mg/dL      Peak postprandial:   less than 180 mg/dL (1-2 hours)      Critically ill patients:  140 - 180 mg/dL   Results for MYRANDA, PAVONE (MRN [id number]) as of 10/26/2016 15:05  Ref. Range 10/25/2016 18:06 10/25/2016 22:41 10/26/2016 02:06  Glucose-Capillary Latest Ref Range: 65 - 99 mg/dL 171 (H) 257 (H) 185 (H)    Home DM Meds: Insulin Pump  Current Insulin Orders: Insulin Pump     Met with patient today.  Patient Alert and Oriented and able to manage insulin pump independently.  Does not have extra supplies at bedside but can have husband bring more if needed.  See below for pump settings.    Spoke with RN and discussed documentation needs.  RN will need to check pt's CBGs with hospital meter, document all insulin boluses given by pt with her pump, and document insulin pump assessment Qshift and prn.  RN stated understanding.  Basal: 12 AM-0.525 units/hr 3 AM- 0.625 units/hr 5 AM-0.95 units/hr 8AM-0.575 units/hr 2 PM- 0.625 units/hr 8 PM- 0.725 units/hr  Total Basal=15.775 units/24 hours  Bolus: 1 unit/12 grams of CHO and 1 unit/14 grams of CHO 1 unit drops CBG approximately 50 mg/dL  Target CBG 90-120 mg/dl.     --Will follow patient during hospitalization--  Wyn Quaker RN, MSN, CDE Diabetes Coordinator Inpatient Glycemic Control Team Team Pager: [phone] (8a-5p)

## 2016-10-26 NOTE — Progress Notes (Signed)
PHARMACIST - PHYSICIAN ORDER COMMUNICATION  CONCERNING: Simvastatin 20 mg daily and diltiazem - rhabdomyolysis risk   Description:  Patients on diltiazem and simvastatin >10 mg/day have reported cases of rhabdomyolysis.  Patient was ordered simvastatin 20mg , Per Sch[facility]ield will substitute atorvastatin (Lipitor) 1mg  for each 2mg  simvastatin and leave communication form advising MD of change.   Patient is now receiving atorvastatin 10mg  daily.   Pernell Dupre, PharmD, BCPS Clinical Pharmacist 10/26/2016 11:03 AM

## 2016-10-26 NOTE — Progress Notes (Signed)
[facility] Consultation      Assessment and Plan:  Acute hypoxic respiratory failure likely due to pulm edema/volume overload.  Bilateral ground glass infiltrates, which may be due to pulm edema vs air trapping.  RLL endobronchial lesion, suspect mucus plug but lung mass can not be ruled out. Suspect acute bronchitis, given increase in secretions.  Atelectasis.  Pt would be at high risk of complications due to recent cardiac arrest, NSTEMI,  therefore would not perform bronchoscopy at this time.   --Continue treatment of acute bronchitis with abx.  --Will add hypertonic saline nebs.  --Ordered flutter valve, incentive spirometry.  --Recommend increased activity to help mobilize secretions, up in chair.  --Repeat CT chest in 3-4 weeks to see if endobronchial lesion has improved, if not, pt will need cardiac clearance for bronchoscopy.    Date: 10/26/2016  MRN# [id number] EUDORA GUEVARRA Jan 10, 1951  Referring Physician: Dr. Tressia Miners for lung mass.   ANASOPHIA PECOR is a 66 y.o. old female seen in consultation for chief complaint of:    Chief Complaint  Patient presents with  . Shortness of Breath    HPI:  The patient is a 66 yo female with episode of Vfib cardiac arrest and NSTEMI in response to anaphylaxis from ceftriaxone administered for hydronephrosis on 10/11/16. She underwent prolonged CPR/ACLS (45 minutes). After resuscitation, she had findings on EKG worrisome for ischemia and therefore was taken for [facility] Hypothermia protocol (36) was initiated. LHC showed 2 vessel CAD. She was transferred to [facility] for CT surgery eval for CABG, it was recommended that she undergo cabg after recovering from her acute illness.  She was found to have a right thigh hematoma, she was eventually DC home on 10/21/16.  Pt is now readmitted yesterday for LE edema. CT chest showed small pericardial effusion, lung infiltrates. The patient notes that she was very functional with no  limitations before the admission. She was ambulating with a walker at the time of discharge and could go 20 to 30 feet, but now can only stand and walk a few steps. She has gotten significantly weaker since getting home.  She has been coughing for the past week, and since getting home with increase mucus production, she also has pain in her chest from the CPR and notes difficulty taking ddep breaths due to pain. She was smoker of 5 cigs per day before the admission, she had no previous diagnosis of respiratory disease and did not use any inhalers.   Images personally reviewed; Ct chest 10/25/16; there is diffuse ground glass changes throughout both lungs, there is also fill defect in the the RLL bronchus which may represent endobrochial lesion or mucus plugging.    PMHX:   Past Medical History:  Diagnosis Date  . CAD (coronary artery disease)    a. 1999 s/p PTCA of "small vessel" @ [facility];  b. Biannual stress tests w/ PCP, reportedly nl;  c. NSTEMI/Cath 10/11/2016: LM 20%, oLAD 40%, p-mLAD 40% eccentric/calcified, dLAD 40%, p-mLCx 95%, eccentric, sev calcified, pRCA 30%, eccentric, sev calcified, mRCA lesion-1 90% focal & eccentric, sev calcified, mRCA lesion-2 99% sev calcified, EF 55-65%; d. 10/2016 CT Surgery->rec PCI due to poor lung fxn.  . Groin hematoma    a. 10/2016 Spont groin hematoma w/ anemia req 1u prbcs.  . Nephrolithiasis   . NSTEMI (non-ST elevated myocardial infarction) ([place]) 10/11/2016  . Pneumonia 02/2016  . PVD (peripheral vascular disease) ([place])    a. Abdominal aortogram 10/11/16: Infrarenal abdominal aorta  heavily calcified & ectatic with approximately 40-50% stenosis at the bifurcation. Mild diffuse disease w/ heavy calcification noted in the common & external iliac arteries bilaterally  . Type I diabetes mellitus ([place])    a. On insulin pump.  . Ventricular fibrillation ([place])    a. 10/11/2016: Felt to be secondary to demand ischemia in the setting of underlying CAD and  anaphylactic reaction from IV Rocephin; b. 10/2016 Echo: EF 55-60%, ? mild inf HK; c. 10/2016 Seen by EP: No ICD indication.   Surgical Hx:  Past Surgical History:  Procedure Laterality Date  . ABDOMINAL AORTOGRAM N/A 10/11/2016   Procedure: ABDOMINAL AORTOGRAM;  Surgeon: Nelva Bush, MD;  Location: [place] [facility];  Service: Cardiovascular;  Laterality: N/A;  . LEFT HEART CATH AND CORONARY ANGIOGRAPHY N/A 10/11/2016   Procedure: LEFT HEART CATH AND CORONARY ANGIOGRAPHY;  Surgeon: Nelva Bush, MD;  Location: [place] [facility];  Service: Cardiovascular;  Laterality: N/A;  . TONSILLECTOMY     Family Hx:  Family History  Problem Relation Age of Onset  . Hypertension Mother   . Aortic aneurysm Father   . Hypertension Brother   . Lung disease Neg Hx   . Rheumatologic disease Neg Hx    Social Hx:   Social History  Substance Use Topics  . Smoking status: Current Every Day Smoker    Packs/day: 0.50    Years: 47.00    Types: Cigarettes    Start date: 01/25/1969  . Smokeless tobacco: Never Used     Comment: Second-hand exposure through her husband & father as well.  . Alcohol use 0.0 oz/week   Medication:    Current Facility-Administered Medications:  .  acetaminophen (TYLENOL) tablet 650 mg, 650 mg, Oral, Q6H PRN **OR** acetaminophen (TYLENOL) suppository 650 mg, 650 mg, Rectal, Q6H PRN, Sudini, Srikar, MD .  ALPRAZolam Duanne Moron) tablet 0.25 mg, 0.25 mg, Oral, Daily, Sudini, Srikar, MD, 0.25 mg at 10/26/16 0912 .  aspirin EC tablet 81 mg, 81 mg, Oral, Daily, Sudini, Srikar, MD, 81 mg at 10/26/16 0912 .  atorvastatin (LIPITOR) tablet 10 mg, 10 mg, Oral, q1800, Hallaji, Sheema M, RPH .  diltiazem (CARDIZEM) tablet 30 mg, 30 mg, Oral, Q6H, Murray Hodgkins R, NP, 30 mg at 10/26/16 1330 .  ezetimibe (ZETIA) tablet 10 mg, 10 mg, Oral, Daily **AND** [DISCONTINUED] simvastatin (ZOCOR) tablet 20 mg, 20 mg, Oral, q1800, Sudini, Srikar, MD .  feeding supplement (GLUCERNA SHAKE)  (GLUCERNA SHAKE) liquid 237 mL, 237 mL, Oral, TID BM, Patel, Shreyang, MD .  insulin pump, , Subcutaneous, TID AC, HS, 0200, Sudini, Srikar, MD, 7.9 each at 10/26/16 1200 .  ipratropium (ATROVENT) nebulizer solution 0.5 mg, 0.5 mg, Nebulization, Q6H PRN, Dustin Flock, MD .  levalbuterol (XOPENEX) nebulizer solution 0.63 mg, 0.63 mg, Nebulization, Q6H PRN, Rogelia Mire, NP .  levofloxacin (LEVAQUIN) IVPB 750 mg, 750 mg, Intravenous, Q24H, Hillary Bow, MD, Stopped at 10/25/16 2210 .  methylPREDNISolone sodium succinate (SOLU-MEDROL) 125 mg/2 mL injection 60 mg, 60 mg, Intravenous, Q24H, Sudini, Srikar, MD, 60 mg at 10/25/16 1854 .  ondansetron (ZOFRAN) tablet 4 mg, 4 mg, Oral, Q6H PRN **OR** ondansetron (ZOFRAN) injection 4 mg, 4 mg, Intravenous, Q6H PRN, Sudini, Srikar, MD .  oxyCODONE (Oxy IR/ROXICODONE) immediate release tablet 5 mg, 5 mg, Oral, Q4H PRN, Sudini, Srikar, MD .  polyethylene glycol (MIRALAX / GLYCOLAX) packet 17 g, 17 g, Oral, Daily PRN, Hillary Bow, MD   Allergies:  Ceftriaxone; Rocephin [ceftriaxone sodium in dextrose]; and Albuterol  Review of Systems: Gen:  Denies  fever, sweats, chills HEENT: Denies blurred vision, double vision. bleeds, sore throat Cvc:  No dizziness, chest pain. Resp:   Denies hemoptysis.  Gi: Denies swallowing difficulty, stomach pain. Gu:  Denies bladder incontinence, burning urine Ext:   No Joint pain, stiffness. Skin: No skin rash,  hives  Endoc:  No polyuria, polydipsia. Psych: No depression, insomnia. Other:  All other systems were reviewed with the patient and were negative other that what is mentioned in the HPI.   Physical Examination:   VS: BP 125/61 (BP Location: Right Arm)   Pulse 96   Temp 98 F (36.7 C) (Oral)   Resp 18   Ht 5\' 3"  (1.6 m)   Wt 131 lb 6.4 oz (59.6 kg)   SpO2 92%   BMI 23.28 kg/m   General Appearance: No distress  Neuro:without focal findings,  speech normal,  HEENT: PERRLA, EOM intact.     Pulmonary: normal breath sounds, No wheezing.  CardiovascularNormal S1,S2.  No m/r/g.   Abdomen: Benign, Soft, non-tender. Renal:  No costovertebral tenderness  GU:  No performed at this time. Endoc: No evident thyromegaly, no signs of acromegaly. Skin:   warm, no rashes, no ecchymosis  Extremities: normal, no cyanosis, clubbing.  Other findings:    LABORATORY PANEL:   CBC  Recent Labs Lab 10/26/16 0400  WBC 14.5*  HGB 10.7*  HCT 31.2*  PLT 576*   ------------------------------------------------------------------------------------------------------------------  Chemistries   Recent Labs Lab 10/26/16 0400  NA 129*  K 4.1  CL 95*  CO2 23  GLUCOSE 237*  BUN 15  CREATININE 0.51  CALCIUM 8.4*  MG 1.5*  AST 47*  ALT 41  ALKPHOS 112  BILITOT 1.4*   ------------------------------------------------------------------------------------------------------------------  Cardiac Enzymes  Recent Labs Lab 10/25/16 1441  TROPONINI 0.03*   ------------------------------------------------------------  RADIOLOGY:  Ct Angio Chest Pe W Or Wo Contrast  Result Date: 10/25/2016 CLINICAL DATA:  Pt arrived POV for reports of SOB and cough. Pt was seen here recently and had anaphylactic reaction to Rocephin. Pt room air SpO2 84%, pt placed on 3L O2 via [place] and SpO2 increased to 92%. Pt reports cough. Bilateral wheezes heard at lung bases EXAM: CT ANGIOGRAPHY CHEST WITH CONTRAST TECHNIQUE: Multidetector CT imaging of the chest was performed using the standard protocol during bolus administration of intravenous contrast. Multiplanar CT image reconstructions and MIPs were obtained to evaluate the vascular anatomy. CONTRAST:  75 cc Isovue 370 COMPARISON:  Chest x-ray 10/25/2016 FINDINGS: Cardiovascular: Pulmonary arteries are well opacified. There is no acute pulmonary embolus. There is extensive 3 vessel coronary artery calcification. Pericardial effusion is present, measuring  approximately 11 mm in width. Pericardial effusion appears higher attenuation than simple water density and may contain proteinaceous material, debris, purulent, or bloody fluid. There is extensive atherosclerotic calcification of the thoracic aorta not associated with aneurysm. Mediastinum/Nodes: The visualized portion of the thyroid gland has a normal appearance. Esophagus is normal. Small hiatal hernia. No significant mediastinal, hilar, or axillary adenopathy. Lungs/Pleura: There are numerous ground-glass opacities throughout the lungs bilaterally, primarily involving the upper lobes but also involving the left lower lobe. There is relative sparing of this opacity in the right lower lobe. There is atelectasis and right lower lobe obstruction by a mucus plug or soft tissue mass. There is bibasilar atelectasis. No significant pleural effusion. Upper Abdomen: Unremarkable. Musculoskeletal: There are Schmorl's nodes at numerous thoracic vertebral levels. Numerous bilateral rib fractures are identified, most of which are probably chronic.  Old rib fractures are identified involving at least ribs 2 to through 7. Old right rib fractures are identified, involving at least ribs 2 through 6. Right fifth rib fracture appears acute. Correlation with history of trauma is recommended. Review of the MIP images confirms the above findings. IMPRESSION: 1. Technically adequate exam showing no acute pulmonary embolus. 2. Diffuse parenchymal alveolar infiltrates compatible with infectious or inflammatory process. 3. Right lower lobe bronchus is obstructed by a mucus plug or soft tissue mass. There is right lower lobe atelectasis and relative sparing of airspace filling opacities in the right lower lobe. 4. Cardiomegaly, extensive coronary artery disease, and high attenuation pericardial effusion. Considerations include hemopericardium, pericarditis, or other high attenuation material such as proteinaceous fluid. 5. Small hiatal  hernia. 6. Multiple bilateral rib fractures. Right fifth rib fracture appears acute. Correlation with history of trauma is indicated. Electronically Signed   By: Nolon Nations M.D.   On: 10/25/2016 16:21   Dg Chest Port 1 View  Result Date: 10/25/2016 CLINICAL DATA:  reports of SOB and cough. Pt was seen here recently and had anaphylactic reaction to rocephin last week, history of CAD, NSTEMI, diabetes, ventricular fibrillation EXAM: PORTABLE CHEST 1 VIEW COMPARISON:  10/19/2016 FINDINGS: There is elevation of the RIGHT hemidiaphragm not changed from 10/19/2016 but new from 10/11/2016. Normal cardiac silhouette. Lungs relatively clear. Mild RIGHT basilar atelectasis. No acute osseous abnormality. IMPRESSION: Elevation of the RIGHT hemidiaphragm is new from 10/11/2016 with differential including subpulmonic effusion versus potential diaphragmatic paralysis. Electronically Signed   By: Suzy Bouchard M.D.   On: 10/25/2016 15:11       Thank  you for the consultation and for allowing [facility] to assist in the care of your patient. Our recommendations are noted above.  Please contact us if we can be of further service.   Marda Stalker, MD.  Board Certified in Internal Medicine, Pulmonary Medicine, [place], and Sleep Medicine.  [facility] Office Number: [phone]  Patricia Pesa, M.D.  Merton Border, M.D  10/26/2016

## 2016-10-27 LAB — CBC
HCT: 28.8 % — ABNORMAL LOW (ref 35.0–47.0)
Hemoglobin: 9.9 g/dL — ABNORMAL LOW (ref 12.0–16.0)
MCH: 29.2 pg (ref 26.0–34.0)
MCHC: 34.5 g/dL (ref 32.0–36.0)
MCV: 84.7 fL (ref 80.0–100.0)
PLATELETS: 579 10*3/uL — AB (ref 150–440)
RBC: 3.4 MIL/uL — AB (ref 3.80–5.20)
RDW: 16.2 % — AB (ref 11.5–14.5)
WBC: 21.6 10*3/uL — ABNORMAL HIGH (ref 3.6–11.0)

## 2016-10-27 LAB — GLUCOSE, CAPILLARY
GLUCOSE-CAPILLARY: 338 mg/dL — AB (ref 65–99)
GLUCOSE-CAPILLARY: 297 mg/dL — AB (ref 65–99)
Glucose-Capillary: 338 mg/dL — ABNORMAL HIGH (ref 65–99)
Glucose-Capillary: 427 mg/dL — ABNORMAL HIGH (ref 65–99)
Glucose-Capillary: 388 mg/dL — ABNORMAL HIGH (ref 65–99)

## 2016-10-27 LAB — BASIC METABOLIC PANEL
Anion gap: 9 (ref 5–15)
BUN: 21 mg/dL — AB (ref 6–20)
CALCIUM: 8.3 mg/dL — AB (ref 8.9–10.3)
CO2: 28 mmol/L (ref 22–32)
CREATININE: 0.45 mg/dL (ref 0.44–1.00)
Chloride: 92 mmol/L — ABNORMAL LOW (ref 101–111)
GFR calc Af Amer: >60 mL/min (ref >60)
GLUCOSE: 330 mg/dL — AB (ref 65–99)
Potassium: 4.6 mmol/L (ref 3.5–5.1)
SODIUM: 129 mmol/L — AB (ref 135–145)

## 2016-10-27 NOTE — Care Management (Signed)
Patient's current oxygen requirement is acute.  She is adamant she does not have home oxygen.  She said "she heard something about [facility]" but no one ever came from a previous admission.  A referral has been initiated with [facility] for home health SN PT OT Aide.  There will also be a possible need for home oxygen.

## 2016-10-27 NOTE — Progress Notes (Signed)
[facility] Consultation      Assessment and Plan:  Acute hypoxic respiratory failure likely due to pulm edema/volume overload.  Bilateral ground glass infiltrates, which may be due to pulm edema vs air trapping.  RLL endobronchial lesion, suspect mucus plug but lung mass can not be ruled out. Suspect acute bronchitis, given increase in secretions.  Atelectasis.  Pt would be at high risk of complications due to recent cardiac arrest, NSTEMI,  therefore would not perform bronchoscopy at this time.   --Continue treatment of acute bronchitis with abx, hypertonic saline nebs, flutter valve, incentive spirometry.  --Recommend increased activity to help mobilize secretions, up in chair.  --Repeat CT chest in 4 weeks to see if endobronchial lesion has improved, if not, pt will need cardiac clearance for bronchoscopy.  --My office will scheduled CT chest and clinic follow up after that.   Pt is otherwise stable for DC from respiratory standpoint, she would  Benefit from continued incentive spirometer and flutter valve use and increased activity.    Date: 10/27/2016  MRN# [id number] Lisa [name] August 04, 1950  Referring Physician: Dr. Tressia Miners for lung mass.   Lisa [name] is a 66 y.o. old female seen in consultation for chief complaint of:    Chief Complaint  Patient presents with  . Shortness of Breath    Subjective.   No new complaints, she feels that she is doing better and her breathing is doing better. She is better tolerating exercise. She feels that she can get more air in her lungs now.   Ct chest 10/25/16; there is diffuse ground glass changes throughout both lungs, there is also fill defect in the the RLL bronchus which may represent endobrochial lesion or mucus plugging.   Medication:    Current Facility-Administered Medications:  .  acetaminophen (TYLENOL) tablet 650 mg, 650 mg, Oral, Q6H PRN **OR** acetaminophen (TYLENOL) suppository 650 mg, 650 mg,  Rectal, Q6H PRN, Sudini, Srikar, MD .  ALPRAZolam Duanne Moron) tablet 0.25 mg, 0.25 mg, Oral, Daily, Sudini, Srikar, MD, 0.25 mg at 10/27/16 0904 .  aspirin EC tablet 81 mg, 81 mg, Oral, Daily, Sudini, Srikar, MD, 81 mg at 10/27/16 0904 .  atorvastatin (LIPITOR) tablet 10 mg, 10 mg, Oral, q1800, Hallaji, Sheema M, RPH, 10 mg at 10/26/16 1712 .  diltiazem (CARDIZEM) tablet 30 mg, 30 mg, Oral, Q6H, Murray Hodgkins R, NP, 30 mg at 10/27/16 0547 .  ezetimibe (ZETIA) tablet 10 mg, 10 mg, Oral, Daily, 10 mg at 10/27/16 0904 **AND** [DISCONTINUED] simvastatin (ZOCOR) tablet 20 mg, 20 mg, Oral, q1800, Sudini, Srikar, MD .  feeding supplement (GLUCERNA SHAKE) (GLUCERNA SHAKE) liquid 237 mL, 237 mL, Oral, TID BM, Dustin Flock, MD, 237 mL at 10/27/16 0905 .  furosemide (LASIX) injection 20 mg, 20 mg, Intravenous, Q12H, Dustin Flock, MD, 20 mg at 10/27/16 0547 .  guaiFENesin (MUCINEX) 12 hr tablet 600 mg, 600 mg, Oral, BID, Dustin Flock, MD, 600 mg at 10/27/16 0904 .  insulin pump, , Subcutaneous, TID AC, HS, 0200, Sudini, Srikar, MD, 3.2 each at 10/27/16 0200 .  ipratropium (ATROVENT) nebulizer solution 0.5 mg, 0.5 mg, Nebulization, Q6H PRN, Dustin Flock, MD .  levofloxacin (LEVAQUIN) IVPB 750 mg, 750 mg, Intravenous, Q24H, Hillary Bow, MD, Stopped at 10/26/16 1842 .  methylPREDNISolone sodium succinate (SOLU-MEDROL) 125 mg/2 mL injection 60 mg, 60 mg, Intravenous, Q24H, Sudini, Srikar, MD, 60 mg at 10/26/16 1710 .  multivitamin with minerals tablet 1 tablet, 1 tablet, Oral, Daily, Dustin Flock, MD, 1  tablet at 10/27/16 0904 .  ondansetron (ZOFRAN) tablet 4 mg, 4 mg, Oral, Q6H PRN **OR** ondansetron (ZOFRAN) injection 4 mg, 4 mg, Intravenous, Q6H PRN, Sudini, Srikar, MD .  oxyCODONE (Oxy IR/ROXICODONE) immediate release tablet 5 mg, 5 mg, Oral, Q4H PRN, Sudini, Srikar, MD .  polyethylene glycol (MIRALAX / GLYCOLAX) packet 17 g, 17 g, Oral, Daily PRN, Hillary Bow, MD, 17 g at 10/27/16 0904 .   sodium chloride flush (NS) 0.9 % injection 3 mL, 3 mL, Intravenous, Q12H, Dustin Flock, MD, 3 mL at 10/26/16 2141 .  sodium chloride HYPERTONIC 3 % nebulizer solution 4 mL, 4 mL, Nebulization, TID, Laverle Hobby, MD, 4 mL at 10/26/16 2106   Allergies:  Ceftriaxone; Rocephin [ceftriaxone sodium in dextrose]; and Albuterol  Review of Systems: Gen:  Denies  fever, sweats, chills HEENT: Denies blurred vision, double vision. bleeds, sore throat Cvc:  No dizziness, chest pain. Resp:   Denies hemoptysis.  Gi: Denies swallowing difficulty, stomach pain. Gu:  Denies bladder incontinence, burning urine Ext:   No Joint pain, stiffness. Skin: No skin rash,  hives  Endoc:  No polyuria, polydipsia. Psych: No depression, insomnia. Other:  All other systems were reviewed with the patient and were negative other that what is mentioned in the HPI.   Physical Examination:   VS: BP (!) 127/59 (BP Location: Left Arm)   Pulse 95   Temp 97.7 F (36.5 C) (Oral)   Resp 20   Ht 5\' 3"  (1.6 m)   Wt 130 lb 8 oz (59.2 kg)   SpO2 100%   BMI 23.12 kg/m   General Appearance: No distress  Neuro:without focal findings,  speech normal,  HEENT: PERRLA, EOM intact.   Pulmonary: normal breath sounds, No wheezing.  CardiovascularNormal S1,S2.  No m/r/g.   Abdomen: Benign, Soft, non-tender. Renal:  No costovertebral tenderness  GU:  No performed at this time. Endoc: No evident thyromegaly, no signs of acromegaly. Skin:   warm, no rashes, no ecchymosis  Extremities: normal, no cyanosis, clubbing.  Other findings:    LABORATORY PANEL:   CBC  Recent Labs Lab 10/27/16 0614  WBC 21.6*  HGB 9.9*  HCT 28.8*  PLT 579*   ------------------------------------------------------------------------------------------------------------------  Chemistries   Recent Labs Lab 10/26/16 0400 10/27/16 0614  NA 129* 129*  K 4.1 4.6  CL 95* 92*  CO2 23 28  GLUCOSE 237* 330*  BUN 15 21*  CREATININE  0.51 0.45  CALCIUM 8.4* 8.3*  MG 1.5*  --   AST 47*  --   ALT 41  --   ALKPHOS 112  --   BILITOT 1.4*  --    ------------------------------------------------------------------------------------------------------------------  Cardiac Enzymes  Recent Labs Lab 10/25/16 1441  TROPONINI 0.03*   ------------------------------------------------------------  RADIOLOGY:  Ct Angio Chest Pe W Or Wo Contrast  Result Date: 10/25/2016 CLINICAL DATA:  Pt arrived POV for reports of SOB and cough. Pt was seen here recently and had anaphylactic reaction to Rocephin. Pt room air SpO2 84%, pt placed on 3L O2 via [place] and SpO2 increased to 92%. Pt reports cough. Bilateral wheezes heard at lung bases EXAM: CT ANGIOGRAPHY CHEST WITH CONTRAST TECHNIQUE: Multidetector CT imaging of the chest was performed using the standard protocol during bolus administration of intravenous contrast. Multiplanar CT image reconstructions and MIPs were obtained to evaluate the vascular anatomy. CONTRAST:  75 cc Isovue 370 COMPARISON:  Chest x-ray 10/25/2016 FINDINGS: Cardiovascular: Pulmonary arteries are well opacified. There is no acute pulmonary embolus. There is extensive  3 vessel coronary artery calcification. Pericardial effusion is present, measuring approximately 11 mm in width. Pericardial effusion appears higher attenuation than simple water density and may contain proteinaceous material, debris, purulent, or bloody fluid. There is extensive atherosclerotic calcification of the thoracic aorta not associated with aneurysm. Mediastinum/Nodes: The visualized portion of the thyroid gland has a normal appearance. Esophagus is normal. Small hiatal hernia. No significant mediastinal, hilar, or axillary adenopathy. Lungs/Pleura: There are numerous ground-glass opacities throughout the lungs bilaterally, primarily involving the upper lobes but also involving the left lower lobe. There is relative sparing of this opacity in the right  lower lobe. There is atelectasis and right lower lobe obstruction by a mucus plug or soft tissue mass. There is bibasilar atelectasis. No significant pleural effusion. Upper Abdomen: Unremarkable. Musculoskeletal: There are Schmorl's nodes at numerous thoracic vertebral levels. Numerous bilateral rib fractures are identified, most of which are probably chronic. Old rib fractures are identified involving at least ribs 2 to through 7. Old right rib fractures are identified, involving at least ribs 2 through 6. Right fifth rib fracture appears acute. Correlation with history of trauma is recommended. Review of the MIP images confirms the above findings. IMPRESSION: 1. Technically adequate exam showing no acute pulmonary embolus. 2. Diffuse parenchymal alveolar infiltrates compatible with infectious or inflammatory process. 3. Right lower lobe bronchus is obstructed by a mucus plug or soft tissue mass. There is right lower lobe atelectasis and relative sparing of airspace filling opacities in the right lower lobe. 4. Cardiomegaly, extensive coronary artery disease, and high attenuation pericardial effusion. Considerations include hemopericardium, pericarditis, or other high attenuation material such as proteinaceous fluid. 5. Small hiatal hernia. 6. Multiple bilateral rib fractures. Right fifth rib fracture appears acute. Correlation with history of trauma is indicated. Electronically Signed   By: Nolon Nations M.D.   On: 10/25/2016 16:21   Dg Chest Port 1 View  Result Date: 10/25/2016 CLINICAL DATA:  reports of SOB and cough. Pt was seen here recently and had anaphylactic reaction to rocephin last week, history of CAD, NSTEMI, diabetes, ventricular fibrillation EXAM: PORTABLE CHEST 1 VIEW COMPARISON:  10/19/2016 FINDINGS: There is elevation of the RIGHT hemidiaphragm not changed from 10/19/2016 but new from 10/11/2016. Normal cardiac silhouette. Lungs relatively clear. Mild RIGHT basilar atelectasis. No acute  osseous abnormality. IMPRESSION: Elevation of the RIGHT hemidiaphragm is new from 10/11/2016 with differential including subpulmonic effusion versus potential diaphragmatic paralysis. Electronically Signed   By: Suzy Bouchard M.D.   On: 10/25/2016 15:11       Thank  you for the consultation and for allowing [facility] to assist in the care of your patient. Our recommendations are noted above.  Please contact us if we can be of further service.   Marda Stalker, MD.  Board Certified in Internal Medicine, Pulmonary Medicine, [place], and Sleep Medicine.  [facility] Office Number: [phone]  Patricia Pesa, M.D.  Merton Border, M.D  10/27/2016

## 2016-10-27 NOTE — Progress Notes (Signed)
Inpatient Diabetes Program Recommendations  AACE/ADA: New Consensus Statement on Inpatient Glycemic Control (2015)  Target Ranges:  Prepandial:   less than 140 mg/dL      Peak postprandial:   less than 180 mg/dL (1-2 hours)      Critically ill patients:  140 - 180 mg/dL   Lab Results  Component Value Date   GLUCAP 338 (H) 10/27/2016   HGBA1C 7.0 (H) 10/15/2016    Review of Glycemic Control  Results for CHRYSTEL, BAREFIELD (MRN [id number]) as of 10/27/2016 09:42  Ref. Range 10/26/2016 02:06 10/26/2016 16:44 10/26/2016 21:05 10/27/2016 01:57 10/27/2016 08:26  Glucose-Capillary Latest Ref Range: 65 - 99 mg/dL 185 (H) 432 (H) 318 (H) 338 (H) 338 (H)    Home DM Meds: Insulin Pump  Current Insulin Orders: Insulin Pump   Basal: 12 AM-0.525 units/hr 3 AM- 0.625 units/hr 5 AM-0.95 units/hr 8AM-0.575 units/hr 2 PM- 0.625 units/hr 8 PM- 0.725 units/hr  Total Basal=15.775 units/24 hours  Bolus: 1 unit/12 grams of CHO and 1 unit/14 grams of CHO 1 unit drops CBG approximately 50 mg/dL  Target CBG 90-120 mg/dl.  No pump setting changes made by the patient and she does not have an alternate basal rate in her pump to use while she is getting steroids.   Met with patient today.  Patient Alert and Oriented and able to manage insulin pump independently.  Does not have extra supplies at bedside but can have husband bring more if needed; he was in the room with her when I went to see her- he is going to get supplies today.   Spoke with RN and discussed documentation needs.  RN will need to check pt's CBGs with hospital meter, document all insulin boluses given by pt with her pump, and document insulin pump assessment Qshift and prn.  RN stated understanding.  Gentry Fitz, RN, BA, MHA, CDE Diabetes Coordinator Inpatient Diabetes Program  [phone] (Team Pager) [phone] ([place]) 10/27/2016 9:44  AM

## 2016-10-27 NOTE — Progress Notes (Signed)
[facility]                                                                                                                                                                                  Patient Demographics   Lisa [name], is a 66 y.o. female, DOB - Aug 21, 1950, VWU:[id number]  Admit date - 10/25/2016   Admitting Physician Hillary Bow, MD  Outpatient Primary MD for the patient is Morayati, Lourdes Sledge, MD   LOS - 2  Subjective: Patient states that her breathing is improved No chest pain  Review of Systems:   CONSTITUTIONAL: No documented fever. No fatigue, weakness. No weight gain, no weight loss.  EYES: No blurry or double vision.  ENT: No tinnitus. No postnasal drip. No redness of the oropharynx.  RESPIRATORY: positive cough, no wheeze, no hemoptysis.positive dyspnea.  CARDIOVASCULAR: No chest pain. No orthopnea. No palpitations. No syncope.  GASTROINTESTINAL: No nausea, no vomiting or diarrhea. No abdominal pain. No melena or hematochezia.  GENITOURINARY: No dysuria or hematuria.  ENDOCRINE: No polyuria or nocturia. No heat or cold intolerance.  HEMATOLOGY: No anemia. No bruising. No bleeding.  INTEGUMENTARY: No rashes. No lesions.  MUSCULOSKELETAL: No arthritis. No swelling. No gout.  NEUROLOGIC: No numbness, tingling, or ataxia. No seizure-type activity.  PSYCHIATRIC: No anxiety. No insomnia. No ADD.    Vitals:   Vitals:   10/26/16 2108 10/27/16 0011 10/27/16 0333 10/27/16 1140  BP:  125/72 123/65 (!) 127/59  Pulse:  88 87 95  Resp:   20   Temp:   98 F (36.7 C) 97.7 F (36.5 C)  TempSrc:   Oral Oral  SpO2: 98% 95% 97% 100%  Weight:   130 lb 8 oz (59.2 kg)   Height:        Wt Readings from Last 3 Encounters:  10/27/16 130 lb 8 oz (59.2 kg)  10/21/16 137 lb 11.2 oz (62.5 kg)  10/11/16 149 lb 0.5 oz (67.6 kg)     Intake/Output Summary (Last 24 hours) at 10/27/16 1413 Last data filed at 10/27/16 1359  Gross per 24  hour  Intake              600 ml  Output             1500 ml  Net             -900 ml    Physical Exam:   GENERAL: Pleasant-appearing in no apparent distress.  HEAD, EYES, EARS, NOSE AND THROAT: Atraumatic, normocephalic. Extraocular muscles are intact. Pupils equal and reactive to light. Sclerae anicteric. No conjunctival injection. No oro-pharyngeal erythema.  NECK: Supple. There is no  jugular venous distention. No bruits, no lymphadenopathy, no thyromegaly.  HEART: Regular rate and rhythm,. No murmurs, no rubs, no clicks.  LUNGS: bilateral rhonchus breath sounds ABDOMEN: Soft, flat, nontender, nondistended. Has good bowel sounds. No hepatosplenomegaly appreciated.  EXTREMITIES: No evidence of any cyanosis, clubbing, or peripheral edema.  +2 pedal and radial pulses bilaterally.  NEUROLOGIC: The patient is alert, awake, and oriented x3 with no focal motor or sensory deficits appreciated bilaterally.  SKIN: Moist and warm with no rashes appreciated.  Psych: Not anxious, depressed LN: No inguinal LN enlargement    Antibiotics   Anti-infectives    Start     Dose/Rate Route Frequency Ordered Stop   10/25/16 1700  levofloxacin (LEVAQUIN) IVPB 750 mg     750 mg 100 mL/hr over 90 Minutes Intravenous Every 24 hours 10/25/16 1646        Medications   Scheduled Meds: . ALPRAZolam  0.25 mg Oral Daily  . aspirin EC  81 mg Oral Daily  . atorvastatin  10 mg Oral q1800  . diltiazem  30 mg Oral Q6H  . ezetimibe  10 mg Oral Daily  . feeding supplement (GLUCERNA SHAKE)  237 mL Oral TID BM  . furosemide  20 mg Intravenous Q12H  . guaiFENesin  600 mg Oral BID  . insulin pump   Subcutaneous TID AC, HS, 0200  . methylPREDNISolone (SOLU-MEDROL) injection  60 mg Intravenous Q24H  . multivitamin with minerals  1 tablet Oral Daily  . sodium chloride flush  3 mL Intravenous Q12H  . sodium chloride HYPERTONIC  4 mL Nebulization TID   Continuous Infusions: . levofloxacin (LEVAQUIN) IV Stopped  (10/26/16 1842)   PRN Meds:.acetaminophen **OR** acetaminophen, ipratropium, ondansetron **OR** ondansetron (ZOFRAN) IV, oxyCODONE, polyethylene glycol   Data Review:   Micro Results Recent Results (from the past 240 hour(s))  Blood Culture (routine x 2)     Status: None (Preliminary result)   Collection Time: 10/25/16  4:34 PM  Result Value Ref Range Status   Specimen Description BLOOD LEFT ANTECUBITAL  Final   Special Requests   Final    BOTTLES DRAWN AEROBIC AND ANAEROBIC Blood Culture adequate volume   Culture NO GROWTH 2 DAYS  Final   Report Status PENDING  Incomplete  Blood Culture (routine x 2)     Status: None (Preliminary result)   Collection Time: 10/25/16  4:34 PM  Result Value Ref Range Status   Specimen Description BLOOD RIGHT ANTECUBITAL  Final   Special Requests   Final    BOTTLES DRAWN AEROBIC AND ANAEROBIC Blood Culture adequate volume   Culture NO GROWTH 2 DAYS  Final   Report Status PENDING  Incomplete    Radiology Reports Dg Chest 1 View  Result Date: 10/11/2016 CLINICAL DATA:  ET and NG tube placement. EXAM: CHEST 1 VIEW COMPARISON:  Chest x-ray from same day. FINDINGS: Unchanged positioning of the endotracheal tube with the tip approximately 3.9 cm above the level of the carina. New enteric tube in place with the tip and distal side port in the gastric body. The cardiomediastinal silhouette is normal in size. No focal consolidation, pleural effusion, or pneumothorax. No acute osseous abnormality. IMPRESSION: 1. Interval placement of an enteric tube with the tip and distal side port in the gastric body. 2.  No active cardiopulmonary disease. Electronically Signed   By: Titus Dubin M.D.   On: 10/11/2016 13:32   Dg Chest 1 View  Result Date: 10/11/2016 CLINICAL DATA:  Post intubation, former smoker,  diabetes mellitus EXAM: CHEST 1 VIEW COMPARISON:  Portable exam 1039 hours without priors for comparison FINDINGS: Tip of endotracheal tube projects 4.8 cm above  carina. External pacing leads project over chest. Normal heart size, mediastinal contours, and pulmonary vascularity. Lungs clear. No pleural effusion or pneumothorax. Bones demineralized. IMPRESSION: No acute abnormalities. Electronically Signed   By: Lavonia Dana M.D.   On: 10/11/2016 11:52   Ct Angio Chest Pe W Or Wo Contrast  Result Date: 10/25/2016 CLINICAL DATA:  Pt arrived POV for reports of SOB and cough. Pt was seen here recently and had anaphylactic reaction to Rocephin. Pt room air SpO2 84%, pt placed on 3L O2 via [place] and SpO2 increased to 92%. Pt reports cough. Bilateral wheezes heard at lung bases EXAM: CT ANGIOGRAPHY CHEST WITH CONTRAST TECHNIQUE: Multidetector CT imaging of the chest was performed using the standard protocol during bolus administration of intravenous contrast. Multiplanar CT image reconstructions and MIPs were obtained to evaluate the vascular anatomy. CONTRAST:  75 cc Isovue 370 COMPARISON:  Chest x-ray 10/25/2016 FINDINGS: Cardiovascular: Pulmonary arteries are well opacified. There is no acute pulmonary embolus. There is extensive 3 vessel coronary artery calcification. Pericardial effusion is present, measuring approximately 11 mm in width. Pericardial effusion appears higher attenuation than simple water density and may contain proteinaceous material, debris, purulent, or bloody fluid. There is extensive atherosclerotic calcification of the thoracic aorta not associated with aneurysm. Mediastinum/Nodes: The visualized portion of the thyroid gland has a normal appearance. Esophagus is normal. Small hiatal hernia. No significant mediastinal, hilar, or axillary adenopathy. Lungs/Pleura: There are numerous ground-glass opacities throughout the lungs bilaterally, primarily involving the upper lobes but also involving the left lower lobe. There is relative sparing of this opacity in the right lower lobe. There is atelectasis and right lower lobe obstruction by a mucus plug or soft  tissue mass. There is bibasilar atelectasis. No significant pleural effusion. Upper Abdomen: Unremarkable. Musculoskeletal: There are Schmorl's nodes at numerous thoracic vertebral levels. Numerous bilateral rib fractures are identified, most of which are probably chronic. Old rib fractures are identified involving at least ribs 2 to through 7. Old right rib fractures are identified, involving at least ribs 2 through 6. Right fifth rib fracture appears acute. Correlation with history of trauma is recommended. Review of the MIP images confirms the above findings. IMPRESSION: 1. Technically adequate exam showing no acute pulmonary embolus. 2. Diffuse parenchymal alveolar infiltrates compatible with infectious or inflammatory process. 3. Right lower lobe bronchus is obstructed by a mucus plug or soft tissue mass. There is right lower lobe atelectasis and relative sparing of airspace filling opacities in the right lower lobe. 4. Cardiomegaly, extensive coronary artery disease, and high attenuation pericardial effusion. Considerations include hemopericardium, pericarditis, or other high attenuation material such as proteinaceous fluid. 5. Small hiatal hernia. 6. Multiple bilateral rib fractures. Right fifth rib fracture appears acute. Correlation with history of trauma is indicated. Electronically Signed   By: Nolon Nations M.D.   On: 10/25/2016 16:21   US Renal  Result Date: 10/14/2016 CLINICAL DATA:  Kidney stone.  History of cardiac arrest. EXAM: RENAL / URINARY TRACT ULTRASOUND COMPLETE COMPARISON:  Abdominal CT 10/11/2016 FINDINGS: Right Kidney: Length: 11.5 cm. 2.2 cm hilar cyst. No hydronephrosis. Borderline hyperechoic renal cortex. Left Kidney: Length: 12.5 cm. Mild hydronephrosis, improved from CT 3 days ago. No evidence of mass. Bladder: Decompressed by Foley catheter. IMPRESSION: Mild left hydronephrosis, improved from CT 3 days ago. Electronically Signed   By: Monte Fantasia  M.D.   On: 10/14/2016  09:05   Ct Femur Right Wo Contrast  Result Date: 10/16/2016 CLINICAL DATA:  Right thigh swelling. Raising vascular catheterization. EXAM: CT OF THE LOWER RIGHT EXTREMITY WITHOUT CONTRAST TECHNIQUE: Multidetector CT imaging of the right lower extremity was performed according to the standard protocol. COMPARISON:  CT pelvis 10/11/2016 FINDINGS: Bones/Joint/Cartilage Degenerative arthropathy of both hips. Small bilateral knee joint effusions. Ligaments Suboptimally assessed by CT. Muscles and Tendons There is no focal hematoma around the common femoral vasculature, but there is considerable high density expansion of the right hip adductor musculature, most notably the pectineus, adductor brevis, and adductor magnus muscles. Comparing to the contralateral side, the volume of adductor hematoma on the right is estimated at 420 cubic cm (I obtain this by subtracting the volume of the normal left-sided muscles from the volume of the expanded right-sided adductor musculature). There are some lower density regions within the musculature likely representing mixed density hematoma. Infection and abscess is considered less likely. Soft tissues Subcutaneous edema noted in the right thigh. Edema tracks along deep fascia planes of the medial compartment of the right thigh. Does Foley catheter in the urinary bladder. IMPRESSION: 1. Expansion and high density in the right hip adductor musculature as detailed above, volume of suspected hematoma at 420 cubic cm. There is edema in surrounding fascia planes and in the subcutaneous tissues of the right thigh. 2. No hematoma is observed along the common femoral vasculature directly. This raises suspicion that the right adductor hematoma is spontaneous. 3. Degenerative arthropathy of both hips. 4. Small bilateral knee joint effusions. Electronically Signed   By: Van Clines M.D.   On: 10/16/2016 14:03   Dg Chest Port 1 View  Result Date: 10/25/2016 CLINICAL DATA:  reports of  SOB and cough. Pt was seen here recently and had anaphylactic reaction to rocephin last week, history of CAD, NSTEMI, diabetes, ventricular fibrillation EXAM: PORTABLE CHEST 1 VIEW COMPARISON:  10/19/2016 FINDINGS: There is elevation of the RIGHT hemidiaphragm not changed from 10/19/2016 but new from 10/11/2016. Normal cardiac silhouette. Lungs relatively clear. Mild RIGHT basilar atelectasis. No acute osseous abnormality. IMPRESSION: Elevation of the RIGHT hemidiaphragm is new from 10/11/2016 with differential including subpulmonic effusion versus potential diaphragmatic paralysis. Electronically Signed   By: Suzy Bouchard M.D.   On: 10/25/2016 15:11   Dg Chest Port 1 View  Result Date: 10/19/2016 CLINICAL DATA:  Acute respiratory failure, hypoxia EXAM: PORTABLE CHEST 1 VIEW COMPARISON:  10/18/2016 FINDINGS: Right base atelectasis with mildly elevated right hemidiaphragm. Cardiomegaly. Left lung is clear. No effusions or acute bony abnormality. IMPRESSION: Right base atelectasis.  Mild cardiomegaly. Electronically Signed   By: Rolm Baptise M.D.   On: 10/19/2016 08:42   Dg Chest Port 1 View  Result Date: 10/18/2016 CLINICAL DATA:  Acute respiratory failure EXAM: PORTABLE CHEST 1 VIEW COMPARISON:  10/17/2016 FINDINGS: Cardiac shadow is mildly enlarged but stable. Aortic calcifications are again seen. Lungs are well-aerated with right basilar atelectatic changes. Some mild interstitial changes are seen particularly on the left but stable. No new focal abnormality is seen. IMPRESSION: No significant interval change from the prior exam. Electronically Signed   By: Inez Catalina M.D.   On: 10/18/2016 07:56   Dg Chest Port 1 View  Result Date: 10/17/2016 CLINICAL DATA:  Cough, acute respiratory failure, hypoxia EXAM: PORTABLE CHEST 1 VIEW COMPARISON:  10/16/2016 FINDINGS: Cardiomegaly. Diffuse left lung airspace disease, most confluent in the left upper lobe. Right basilar atelectasis or infiltrate. Findings  are similar to prior study. Suspect small right effusion. IMPRESSION: Stable bilateral airspace opacities, left greater than right. Suspect small effusion on the right. No real change. Electronically Signed   By: Rolm Baptise M.D.   On: 10/17/2016 07:46   Dg Chest Port 1 View  Result Date: 10/16/2016 CLINICAL DATA:  Shortness of breath. EXAM: PORTABLE CHEST 1 VIEW COMPARISON:  October 15, 2016 FINDINGS: Left greater than right pulmonary opacities persist, improved in the interval. No other interval changes. IMPRESSION: Improving but persistent left greater than right pulmonary opacities. Electronically Signed   By: Dorise Bullion III M.D   On: 10/16/2016 08:40   Dg Chest Port 1 View  Result Date: 10/15/2016 CLINICAL DATA:  Followup acute respiratory failure. EXAM: PORTABLE CHEST 1 VIEW COMPARISON:  10/14/2016 FINDINGS: Widespread bilateral pneumonia persists, with slight radiographic improvement. Small amount of a fusion and focal volume loss in the right lower lobe. No worsening or new findings. IMPRESSION: Radiographic improvement of diffuse pneumonia. Some persistent volume loss at the right base. Electronically Signed   By: Nelson Chimes M.D.   On: 10/15/2016 07:49   Dg Chest Port 1 View  Result Date: 10/14/2016 CLINICAL DATA:  66 y/o  F; respiratory distress. EXAM: PORTABLE CHEST 1 VIEW COMPARISON:  10/13/2016 chest radiograph FINDINGS: Asymmetric consolidations of the lungs are stable from prior chest radiographs. Small to moderate right pleural effusion. Stable cardiac silhouette within normal limits. Aortic atherosclerosis with calcification. No acute osseous abnormality. IMPRESSION: Stable asymmetric consolidations throughout the lungs and small to moderate right effusion. Differential includes multifocal pneumonia and pulmonary edema. Electronically Signed   By: Kristine Garbe M.D.   On: 10/14/2016 01:30   Dg Chest Port 1 View  Result Date: 10/13/2016 CLINICAL DATA:  Shortness  of breath today EXAM: PORTABLE CHEST 1 VIEW COMPARISON:  Earlier today FINDINGS: Continued progression of bilateral airspace disease, asymmetric to the left. Normal heart size for technique. Negative mediastinal contours. Small if any pleural effusions. No pneumothorax. IMPRESSION: Progression of asymmetric bilateral airspace disease favoring pneumonia or noncardiogenic edema (was there aspiration at time of cardiac arrest?). Electronically Signed   By: Monte Fantasia M.D.   On: 10/13/2016 13:44   Dg Chest Port 1 View  Result Date: 10/13/2016 CLINICAL DATA:  Respiratory failure. EXAM: PORTABLE CHEST 1 VIEW COMPARISON:  10/11/2016. FINDINGS: Interim removal of endotracheal tube and NG tube. Heart size normal. New onset of diffuse bilateral pulmonary interstitial prominence noted. Findings consistent with a process such as interstitial pneumonitis or interstitial edema. Small right pleural effusion. No pneumothorax . IMPRESSION: 1.  Interim removal of endotracheal tube and NG tube. 2. New onset of diffuse bilateral pulmonary interstitial prominence consistent with a process such as interstitial pneumonitis or interstitial edema. Small right pleural effusion. Electronically Signed   By: Marcello Moores  Register   On: 10/13/2016 06:19   Dg Abd Portable 1v  Result Date: 10/14/2016 CLINICAL DATA:  Ureteral calculus, abdominal pain EXAM: PORTABLE ABDOMEN - 1 VIEW COMPARISON:  CT abdomen and pelvis 10/11/2016 FINDINGS: LEFT pelvic phleboliths again identified. Distal LEFT ureteral calculus seen on prior CT exam is not visualized on current study. RIGHT femoral line noted. Bowel gas pattern normal. Diffuse osseous demineralization. IMPRESSION: Previously identified distal LEFT ureteral calculus is no longer seen. Electronically Signed   By: Lavonia Dana M.D.   On: 10/14/2016 07:40   Ct Renal Stone Study  Result Date: 10/11/2016 CLINICAL DATA:  Left flank pain.  History of renal stones. EXAM: CT ABDOMEN AND PELVIS  WITHOUT  CONTRAST TECHNIQUE: Multidetector CT imaging of the abdomen and pelvis was performed following the standard protocol without IV contrast. COMPARISON:  None. FINDINGS: Lower chest: Minimal atelectasis in the lung bases. No pleural effusion. Three-vessel coronary artery atherosclerosis. Normal heart size. No pericardial effusion. Hepatobiliary: No focal liver abnormality is seen. No gallstones, gallbladder wall thickening, or biliary dilatation. Pancreas: Mildly truncated appearance of the pancreatic tail. No ductal dilatation or surrounding inflammatory changes. Spleen: Unremarkable. Adrenals/Urinary Tract: Unremarkable adrenal glands. Punctate nonobstructing calculus in the lower pole of the right kidney. Three punctate nonobstructing left renal calculi. 4 mm obstructing calculus in the distal left ureter near the UVJ resulting in mild hydroureteronephrosis. Mild left perinephric stranding. Unremarkable bladder. Stomach/Bowel: Small sliding hiatal hernia. No evidence of bowel obstruction or inflammation. Unremarkable appendix. Vascular/Lymphatic: Extensive atherosclerosis of the abdominal aorta with mild infrarenal aortic ectasia measuring up to 2.8 cm diameter. Central displacement of intimal calcification more distally in the aorta over a length of 2.5 cm suggests a short segment dissection. No enlarged lymph nodes. Reproductive: 9 mm calcification in the uterine fundus likely reflecting a small fibroid. Unremarkable ovaries. Other: No intraperitoneal free fluid. Small fat containing umbilical hernia. Musculoskeletal: Mild L4 and L5 superior endplate compression fractures, chronic in appearance. Mild spondylosis and moderate posterior element hypertrophy in the lower lumbar spine with likely mild spinal stenosis at L3-4 and L4-5. IMPRESSION: 1. 4 mm distal left ureteral calculus with mild hydroureteronephrosis. 2. Punctate nonobstructing bilateral renal calculi. 3. Small hiatal hernia. 4. Aortic Atherosclerosis  (ICD10-I70.0). Mild infrarenal aortic ectasia and suspected short segment aortic dissection. Electronically Signed   By: Logan Bores M.D.   On: 10/11/2016 08:20     CBC  Recent Labs Lab 10/25/16 1441 10/26/16 0400 10/27/16 0614  WBC 17.6* 14.5* 21.6*  HGB 10.2* 10.7* 9.9*  HCT 30.4* 31.2* 28.8*  PLT 555* 576* 579*  MCV 85.2 85.2 84.7  MCH 28.5 29.1 29.2  MCHC 33.5 34.1 34.5  RDW 16.2* 16.0* 16.2*    Chemistries   Recent Labs Lab 10/25/16 1441 10/26/16 0400 10/27/16 0614  NA 127* 129* 129*  K 3.5 4.1 4.6  CL 90* 95* 92*  CO2 25 23 28   GLUCOSE 187* 237* 330*  BUN 13 15 21*  CREATININE 0.52 0.51 0.45  CALCIUM 8.8* 8.4* 8.3*  MG 1.6* 1.5*  --   AST 47* 47*  --   ALT 38 41  --   ALKPHOS 109 112  --   BILITOT 2.0* 1.4*  --    ------------------------------------------------------------------------------------------------------------------ estimated creatinine clearance is 58.8 mL/min (by C-G formula based on SCr of 0.45 mg/dL). ------------------------------------------------------------------------------------------------------------------ No results for input(s): HGBA1C in the last 72 hours. ------------------------------------------------------------------------------------------------------------------ No results for input(s): CHOL, HDL, LDLCALC, TRIG, CHOLHDL, LDLDIRECT in the last 72 hours. ------------------------------------------------------------------------------------------------------------------ No results for input(s): TSH, T4TOTAL, T3FREE, THYROIDAB in the last 72 hours.  Invalid input(s): FREET3 ------------------------------------------------------------------------------------------------------------------ No results for input(s): VITAMINB12, FOLATE, FERRITIN, TIBC, IRON, RETICCTPCT in the last 72 hours.  Coagulation profile No results for input(s): INR, PROTIME in the last 168 hours.  No results for input(s): DDIMER in the last 72  hours.  Cardiac Enzymes  Recent Labs Lab 10/25/16 1441  TROPONINI 0.03*   ------------------------------------------------------------------------------------------------------------------ Invalid input(s): POCBNP    Assessment & Plan  Patient is a 66 year old with recent CPR  Acute hypoxic respiratory failure due to bilateral pneumonitis and COPD exacerbation -Continue IV steroids, Antibiotics - Scheduled Nebulizers - Inhalers --due to CT scan showing possible mucus plugging versus mass  on the bronchus pulmonary saw the patient they feel she is too high risk to do a bronchoscopy, recommends continuing therapy outpatient follow-up with repeat CT Continue IV Lasix  * Pericardial effusion. Concern for hemopericardium.no hypotension or tachycardia. cardilogy following  * diabetes mellitus. Continue insulin pump as doing at home, bg elevated to steroids  * CAD with multivessel disease. Not a good candidate for CABG due to pulmonary issues according to recent cardiothoracic. Cardiology following Continue aspirin  * atrial fibrillation now in normal sinus rhythm continue Cardizem  *hyperlipidemia unspecified continue Lipitor  * DVT prophylaxis. SCDs       Code Status Orders        Start     Ordered   10/25/16 1649  Full code  Continuous     10/25/16 1651    Code Status History    Date Active Date Inactive Code Status Order ID Comments User Context   10/13/2016 11:29 AM 10/21/2016  6:46 PM Full Code [id number]  Rise Mu, PA-C Inpatient   10/11/2016 12:26 PM 10/13/2016 10:48 AM Full Code [id number]  Nelva Bush, MD Inpatient   10/11/2016 11:15 AM 10/11/2016 12:26 PM Full Code [id number]  Wilhelmina Mcardle, MD Inpatient           Consultscardiology  DVT Prophylaxis  SCDs  Lab Results  Component Value Date   PLT 579 (H) 10/27/2016     Time Spent in minutes   14min  Greater than 50% of time spent in care coordination and counseling patient regarding  the condition and plan of care.   Dustin Flock M.D on 10/27/2016 at 2:13 PM  Between 7am to 6pm - Pager - [phone]  After 6pm go to [email or web address] - password [email or web address] [facility]   Office  [phone]

## 2016-10-28 ENCOUNTER — Inpatient Hospital Stay: Payer: Managed Care, Other (non HMO)

## 2016-10-28 ENCOUNTER — Telehealth: Payer: Self-pay | Admitting: *Deleted

## 2016-10-28 DIAGNOSIS — I4891 Unspecified atrial fibrillation: Secondary | ICD-10-CM

## 2016-10-28 DIAGNOSIS — J9811 Atelectasis: Secondary | ICD-10-CM

## 2016-10-28 LAB — TROPONIN I: TROPONIN I: 0.04 ng/mL — AB (ref <0.03)

## 2016-10-28 LAB — BASIC METABOLIC PANEL
Anion gap: 7 (ref 5–15)
BUN: 25 mg/dL — ABNORMAL HIGH (ref 6–20)
CALCIUM: 8.2 mg/dL — AB (ref 8.9–10.3)
CO2: 30 mmol/L (ref 22–32)
CREATININE: 0.55 mg/dL (ref 0.44–1.00)
Chloride: 93 mmol/L — ABNORMAL LOW (ref 101–111)
GFR calc Af Amer: >60 mL/min (ref >60)
GFR calc non Af Amer: >60 mL/min (ref >60)
GLUCOSE: 306 mg/dL — AB (ref 65–99)
Potassium: 5 mmol/L (ref 3.5–5.1)
Sodium: 130 mmol/L — ABNORMAL LOW (ref 135–145)

## 2016-10-28 LAB — GLUCOSE, CAPILLARY
GLUCOSE-CAPILLARY: 314 mg/dL — AB (ref 65–99)
Glucose-Capillary: 379 mg/dL — ABNORMAL HIGH (ref 65–99)
Glucose-Capillary: 430 mg/dL — ABNORMAL HIGH (ref 65–99)
Glucose-Capillary: 346 mg/dL — ABNORMAL HIGH (ref 65–99)
Glucose-Capillary: 406 mg/dL — ABNORMAL HIGH (ref 65–99)

## 2016-10-28 MED ORDER — DILTIAZEM HCL 25 MG/5ML IV SOLN
10.0000 mg | INTRAVENOUS | Status: AC
  Administered 2016-10-28: 10 mg via INTRAVENOUS
  Filled 2016-10-28: qty 5

## 2016-10-28 MED ORDER — PREDNISONE 20 MG PO TABS: 20.0000 mg | ORAL_TABLET | Freq: Every day | ORAL | Status: DC

## 2016-10-28 MED ORDER — DILTIAZEM HCL 100 MG IV SOLR
5.0000 mg/h | INTRAVENOUS | Status: DC
  Administered 2016-10-28: 10 mg/h via INTRAVENOUS
  Administered 2016-10-28: 5 mg/h via INTRAVENOUS
  Administered 2016-10-28 (×2): 7.5 mg/h via INTRAVENOUS
  Administered 2016-10-28: 9 mg/h via INTRAVENOUS
  Administered 2016-10-28: 5 mg/h via INTRAVENOUS
  Filled 2016-10-28 (×2): qty 100

## 2016-10-28 MED ORDER — DILTIAZEM HCL 30 MG PO TABS
30.0000 mg | ORAL_TABLET | Freq: Four times a day (QID) | ORAL | Status: DC
  Administered 2016-10-28 – 2016-10-30 (×8): 30 mg via ORAL
  Filled 2016-10-28 (×8): qty 1

## 2016-10-28 MED ORDER — PREDNISONE 20 MG PO TABS
40.0000 mg | ORAL_TABLET | Freq: Every day | ORAL | Status: AC
  Administered 2016-10-30: 40 mg via ORAL
  Filled 2016-10-28: qty 2

## 2016-10-28 MED ORDER — LEVOFLOXACIN 750 MG PO TABS
750.0000 mg | ORAL_TABLET | Freq: Every day | ORAL | Status: DC
  Administered 2016-10-28 – 2016-10-29 (×2): 750 mg via ORAL
  Filled 2016-10-28 (×2): qty 1

## 2016-10-28 MED ORDER — PREDNISONE 10 MG PO TABS: 10.0000 mg | ORAL_TABLET | Freq: Every day | ORAL | Status: DC

## 2016-10-28 MED ORDER — PREDNISONE 20 MG PO TABS: 30.0000 mg | ORAL_TABLET | Freq: Every day | ORAL | Status: DC

## 2016-10-28 MED ORDER — PREDNISONE 50 MG PO TABS
60.0000 mg | ORAL_TABLET | Freq: Every day | ORAL | Status: AC
  Administered 2016-10-28: 60 mg via ORAL
  Filled 2016-10-28: qty 1

## 2016-10-28 MED ORDER — DIGOXIN 0.25 MG/ML IJ SOLN
0.1250 mg | Freq: Once | INTRAMUSCULAR | Status: AC
  Administered 2016-10-28: 0.125 mg via INTRAVENOUS
  Filled 2016-10-28: qty 0.5

## 2016-10-28 MED ORDER — PREDNISONE 50 MG PO TABS
50.0000 mg | ORAL_TABLET | Freq: Every day | ORAL | Status: AC
  Administered 2016-10-29: 50 mg via ORAL
  Filled 2016-10-28: qty 1

## 2016-10-28 NOTE — Progress Notes (Signed)
Progress Note  Patient Name: Lisa [name] Date of Encounter: 10/28/2016  Primary Cardiologist: Dr. Saunders Revel  Subjective   Went back into atrial fibrillation with RVR this AM. Noted associated dyspnea and a "funny feeling" in her chest at that time. No symptoms currently.   Inpatient Medications    Scheduled Meds: . ALPRAZolam  0.25 mg Oral Daily  . aspirin EC  81 mg Oral Daily  . atorvastatin  10 mg Oral q1800  . ezetimibe  10 mg Oral Daily  . feeding supplement (GLUCERNA SHAKE)  237 mL Oral TID BM  . guaiFENesin  600 mg Oral BID  . insulin pump   Subcutaneous TID AC, HS, 0200  . levofloxacin  750 mg Oral Daily  . methylPREDNISolone (SOLU-MEDROL) injection  60 mg Intravenous Q24H  . multivitamin with minerals  1 tablet Oral Daily  . sodium chloride flush  3 mL Intravenous Q12H  . sodium chloride HYPERTONIC  4 mL Nebulization TID   Continuous Infusions: . diltiazem (CARDIZEM) infusion 12.5 mg/hr (10/28/16 0825)   PRN Meds: acetaminophen **OR** acetaminophen, ipratropium, ondansetron **OR** ondansetron (ZOFRAN) IV, oxyCODONE, polyethylene glycol   Vital Signs    Vitals:   10/28/16 0919 10/28/16 0932 10/28/16 0947 10/28/16 1001  BP: (!) 106/50 (!) 125/56 (!) 98/49 (!) 105/51  Pulse: (!) 101 99 100 (!) 102  Resp:      Temp:      TempSrc:      SpO2: 100% 97% 100%   Weight:      Height:        Intake/Output Summary (Last 24 hours) at 10/28/16 1035 Last data filed at 10/28/16 1014  Gross per 24 hour  Intake            627.9 ml  Output              500 ml  Net            127.9 ml   Filed Weights   10/26/16 0500 10/27/16 0333 10/28/16 0313  Weight: 131 lb 6.4 oz (59.6 kg) 130 lb 8 oz (59.2 kg) 128 lb 14.4 oz (58.5 kg)    Telemetry    Atrial fibrillation, HR in the 170's this AM, improved to the 90's and low-100's currently.  - Personally Reviewed  ECG    Atrial fibrillation with RVR, HR 153. - Personally Reviewed  Physical Exam   General: Well developed,  well nourished Caucasian female appearing in no acute distress. Head: Normocephalic, atraumatic.  Neck: Supple without bruits, JVD not elevated. Lungs:  Resp regular and unlabored, CTA without wheezing or rales. Heart: Irregularly irregular, S1, S2, no S3, S4, or murmur; no rub. Abdomen: Soft, non-tender, non-distended with normoactive bowel sounds. No hepatomegaly. No rebound/guarding. No obvious abdominal masses. Extremities: No clubbing, cyanosis, or edema. Distal pedal pulses are 2+ bilaterally. Neuro: Alert and oriented X 3. Moves all extremities spontaneously. Psych: Normal affect.  Labs    Chemistry Recent Labs Lab 10/25/16 1441 10/26/16 0400 10/27/16 0614 10/28/16 0425  NA 127* 129* 129* 130*  K 3.5 4.1 4.6 5.0  CL 90* 95* 92* 93*  CO2 25 23 28 30   GLUCOSE 187* 237* 330* 306*  BUN 13 15 21* 25*  CREATININE 0.52 0.51 0.45 0.55  CALCIUM 8.8* 8.4* 8.3* 8.2*  PROT 6.7 7.0  --   --   ALBUMIN 3.1* 3.1*  --   --   AST 47* 47*  --   --   ALT 38 41  --   --  ALKPHOS 109 112  --   --   BILITOT 2.0* 1.4*  --   --   GFRNONAA >60 >60 >60 >60  GFRAA >60 >60 >60 >60  ANIONGAP 12 11 9 7      Hematology Recent Labs Lab 10/25/16 1441 10/26/16 0400 10/27/16 0614  WBC 17.6* 14.5* 21.6*  RBC 3.57* 3.67* 3.40*  HGB 10.2* 10.7* 9.9*  HCT 30.4* 31.2* 28.8*  MCV 85.2 85.2 84.7  MCH 28.5 29.1 29.2  MCHC 33.5 34.1 34.5  RDW 16.2* 16.0* 16.2*  PLT 555* 576* 579*    Cardiac Enzymes Recent Labs Lab 10/25/16 1441 10/28/16 0714  TROPONINI 0.03* <0.03   No results for input(s): TROPIPOC in the last 168 hours.   BNP Recent Labs Lab 10/25/16 1441  BNP 69.0     DDimer No results for input(s): DDIMER in the last 168 hours.   Radiology    Dg Chest Port 1 View  Result Date: 10/28/2016 CLINICAL DATA:  Shortness of breath. EXAM: PORTABLE CHEST 1 VIEW COMPARISON:  CT 10/25/2016.  Chest x-ray 10/25/2016 . FINDINGS: Mediastinum and hilar structures normal. Stable cardiomegaly.  Multifocal bilateral pulmonary infiltrates, improved from prior exam. Small bilateral pleural effusions. No pneumothorax. IMPRESSION: 1. Multifocal bilateral pulmonary infiltrates, slightly improved from prior CT and chest x-ray of 10/25/2016. Continued follow-up exams can be obtained. Small bilateral pleural effusions. 2. Stable cardiomegaly . Electronically Signed   By: Marcello Moores  Register   On: 10/28/2016 08:24    Cardiac Studies   Cardiac Catheterization: 10/11/2016 Conclusions: 1. Severe 2 vessel coronary artery disease, including heavily calcified 95% proximal/mid LCx and sequential 90-99% mid RCA stenoses. 2. Mild to moderate, nonobstructive disease involving the LMCA and LAD. 3. Overall preserved LV contraction with basal inferior hypokinesis. 4. Upper normal to mildly elevated left ventricular filling pressure (LVEDP 15-20 mmHg). 5. Peripheral vascular disease with ectasia and calcification of the infrarenal abdominal aorta and iliac arteries.  Recommendations: 1. I suspect patient's cardiac arrest was due to demand ischemia in the setting of anaphylactic shock from allergic reaction to ceftriaxone. She has TIMI-3 flow in all major epicardial coronary arteries. I recommend medical therapy and treatment of the underlying infection. If she has a meaningful neurologic recovery, atherectomy/PCI to the RCA and LCx will need to be considered. ADDENDUM: Images reviewd on 10/12/16 at interventional conference. Given history of DM, severe calcification of lesions, and eccentricity of mid RCA stenosis, cardiac surgery consultation for CABG should be obtained. If Ms. Timothy is deemed a poor surgical candidate, high-risk PCI with atherectomy could be considered. 2. Obtain transthoracic echocardiogram to confirm LVEF and evaluate for other structural abnormalities. 3. I would like to avoid intra-aortic balloon pump placement if possible, given aortic disease. If hypotension becomes a problem, judicious  vasopressor support is recommended. 4. Low-dose aspirin and high intensity statin therapy. Consider heparin infusion (ACS nomogram) if no invasive procedures are necessary (i.e. percutaneous nephrostomy tube). 5. Consider addition of low-dose beta blocker if heart rate and blood pressure allow. If significant ventricular ectopy is observed, amiodarone infusion should be considered.  Patient Profile     66 y.o. female with PMH of CAD (s/p VF arrest in 10/2016 in the setting of anaphylaxis), Type 1 DM. PVD/AAA, recent spontaneous groin hematoma w/ anemia req PRBCs, and nephrolithiasis who presented to [facility] ED on 10/25/2016 for worsening dyspnea.   Assessment & Plan    1.  Acute resp failure w/ hypoxia - Recently admitted for VF arrest in the setting of anaphylactic rxn  to IV rocephin  45 mins CPR, VDRF  nl EF by echo  severe LCX/RCA dzs  turned down for surgery @ [facility] due to poor lung fxn (long time smoker). D/c'd from [facility] on 9/21, but presented to [facility] on 9/25 for worsening DOE and orthopnea. -  CTA chest neg for PE but pericardial effusion and obstruction of RLL bronchus noted with questionable mucous plug vs mass. Echo performed but not yet read. Overall, her symptoms have significantly improved. Remains on Levaquin. Does not appear volume overloaded by physical examination. IV Lasix was discontinued this morning by the admitting team.    2. CAD - she is s/p recent VF arrest and NSTEMI with severe LCX/RCA disease. Not felt to be a good surgical candidate 2/2 poor lung fxn/COPD.  Plan was for high risk PCI/atherectomy @ some point in the near future, pending recovery from recent event, groin hematoma, and anemia.  - initial troponin negative with repeat value being negative today as well.  - continue ASA, statin, and Zetia.  No ?-blocker in the setting of COPD.   3. Pericardial Effusion - Noted on CTA at time of admission with questions of possible hemopericardium. Echo performed on 10/25/2016  but the official report is not visible. Will ask Dr. Fletcher Anon to look at the images.  4.  RLL Bronchus obstruction - Noted on CTA as above. Pulmonology is following and feels this is likely a mucus plug but mass cannot be excluded. No plans for bronchoscopy at this time due to recent cardiac arrest. Plan is for repeat outpatient chest CT.   5. Paroxysmal Atrial Fibrillation - Initially occurred during last admission with spontaneous conversion to NSR. On Cardizem CD prior to admission but this had been held secondary to hypotension. Noted to have gone back into atrial fibrillation with RVR this morning with HR in the 170's and started on IV Cardizem at that time. Will initiate short-acting PO Cardizem to allow for gradual weaning of IV Cardizem.   -  This patients CHA2DS2-VASc Score and unadjusted Ischemic Stroke Rate (% per year) is equal to 3.2 % stroke rate/year from a score of 3 (DM, Female, Vascular). Not on anticoagulation due to recent groin bleed and plans for upcoming PCI.   6. Normocytic Anemia - Hgb remains stable at 9.9 today. No evidence of active bleeding.   7. Hyponatremia - Na+ 127 on admission, improved to 130 this AM.   8.  Leukocytosis - WBC count remains elevated at 21.6 on 9/27. - currently on Levaquin.   9. Tobacco Abuse/COPD - Quit smoking as of last admission.  - Pulmonology following.   Signed, Erma Heritage , PA-C 10:35 AM 10/28/2016 Pager: [phone]

## 2016-10-28 NOTE — Significant Event (Signed)
Rapid Response Event Note  Overview: Time Called: 0606 Arrival Time: 0607    Initial Focused Assessment: Pt in bed on tele box, heart rate in 150-160 afib rvr. Complaining of chest heaviness. VS as documented in flowsheet   Interventions: Pt's RN gave PO and IV cardizem with no change. Dr. Estanislado Pandy called and orders given for cardizem drip.   Plan of Care (if not transferred): Pt will remain on 2A on cardizem drip  Event Summary: Name of Physician Notified: Pyreddy at [phone]    at    Outcome: Stayed in room and stabalized  Event End Time: 0630  Vandy Tsuchiya A

## 2016-10-28 NOTE — Progress Notes (Signed)
Inpatient Diabetes Program Recommendations  AACE/ADA: New Consensus Statement on Inpatient Glycemic Control (2015)  Target Ranges:  Prepandial:   less than 140 mg/dL      Peak postprandial:   less than 180 mg/dL (1-2 hours)      Critically ill patients:  140 - 180 mg/dL   Results for Lisa [name], Lisa [name] (MRN [id number]) as of 10/28/2016 14:57  Ref. Range 10/27/2016 01:57 10/27/2016 08:26 10/27/2016 11:40 10/27/2016 16:19 10/27/2016 21:11  Glucose-Capillary Latest Ref Range: 65 - 99 mg/dL 338 (H) 338 (H) 427 (H) 297 (H) 388 (H)   Results for Lisa [name], Lisa [name] (MRN [id number]) as of 10/28/2016 14:57  Ref. Range 10/28/2016 01:44 10/28/2016 08:07 10/28/2016 12:00  Glucose-Capillary Latest Ref Range: 65 - 99 mg/dL 314 (H) 379 (H) 430 (H)    Home DM Meds: Insulin Pump  Current Insulin Orders: Insulin Pump   Basal: 12 AM-0.525 units/hr 3 AM- 0.625 units/hr 5 AM-0.95 units/hr 8AM-0.575 units/hr 2 PM- 0.625 units/hr 8 PM- 0.725 units/hr  Total Basal=15.775 units/24 hours  Bolus: 1 unit/12 grams of CHO and 1 unit/14 grams of CHO 1 unit drops CBG approximately 50 mg/dL  Target CBG 90-120 mg/dl.  No pump setting changes made by the patient and she does not have an alternate basal rate in her pump to use while she is getting steroids.  Discussed with patient that we have stopped the Solumedrol and have started her on Prednisone taper.  Discussed with patient that Prednisone often affects the afternoon CBGs (elevations).  Patient stated she is comfortable giving herself higher bolus doses of insulin when needed for elevated CBGs.   Met with patient today. Patient Alert and Oriented and able to manage insulin pump independently.   Per patient, she changed set/site/reservoir/insulin yesterday (09/27).  Spoke with RN and discussed documentation needs. RN will need to check pt's CBGs with hospital meter, document all  insulin boluses given by pt with her pump, and document insulin pump assessment Qshift and prn. RN stated understanding.      --Will follow patient during hospitalization--  Wyn Quaker RN, MSN, CDE Diabetes Coordinator Inpatient Glycemic Control Team Team Pager: [phone] (8a-5p)

## 2016-10-28 NOTE — Plan of Care (Signed)
Problem: Safety: Goal: Ability to remain free from injury will improve Outcome: Progressing  Patient's VSS throughout the shift. Patient converted to NSR, discontinue cardizem drip. Patient denies pain. Patient resting well. RN will continue to monitor.

## 2016-10-28 NOTE — Progress Notes (Signed)
Called by RN this am for patient having rapid heart rate. Heart Rate 160 / min  On oral cardizem currently 12 lead ekg done reveals afib with RVR Start patient on iv cardizem drip Hold oral cardizem Monitor BP closely Cycle troponin Cardiology f/u Monitor on telemetry

## 2016-10-28 NOTE — Telephone Encounter (Signed)
Please schedule CT chest w/o contrast with a 3-4 week follow up w/ DR in a 30 minute slot as a hosp follow up.

## 2016-10-28 NOTE — Progress Notes (Signed)
Activated a rapid response. Patient was in Afib with RVR, heart rate reaching 170's Patient was complaining of palpitations, SOB and heaviness. Administered 10mg  bolus without any relief. Notified Dr. Annamaria Boots of patient's condition. Ordered a cardizem drip. After 30 minutes patient began having some relief. Heart rate is currently 130's.

## 2016-10-28 NOTE — Telephone Encounter (Signed)
Hospital F/U Appointment scheduled for Tues 11/22/16 at 9:00 with Dr. Ashby Dawes.  CT Chest will be scheduled once patient has been D/C. Called floor and gave appointment to nurse along with address to clinic. Advised nurse that I would have to wait until the patient is D/C, so please inform the patient that I will contact patient at home to arrange CT Chest. Catha Gosselin

## 2016-10-28 NOTE — Telephone Encounter (Signed)
-----   Message from Laverle Hobby, MD sent at 10/27/2016 11:54 AM EDT ----- Regarding: HFU Pt needs CT chest without contrast in 3-4 weeks (re: mucus plugging, atelectasis) and then follow up with me after.

## 2016-10-28 NOTE — Progress Notes (Signed)
[facility]                                                                                                                                                                                  Patient Demographics   Lisa [name], is a 66 y.o. female, DOB - 15-Jan-1951, WUJ:[id number]  Admit date - 10/25/2016   Admitting Physician Hillary Bow, MD  Outpatient Primary MD for the patient is Morayati, Lourdes Sledge, MD   LOS - 3  Subjective: Patient heart rate elevated now had to be started on a Cardizem drip. Her breathing seems to be stable    Review of Systems:   CONSTITUTIONAL: No documented fever. No fatigue, weakness. No weight gain, no weight loss.  EYES: No blurry or double vision.  ENT: No tinnitus. No postnasal drip. No redness of the oropharynx.  RESPIRATORY: positive cough, no wheeze, no hemoptysis.positive dyspnea.  CARDIOVASCULAR: No chest pain. No orthopnea. No palpitations. No syncope.  GASTROINTESTINAL: No nausea, no vomiting or diarrhea. No abdominal pain. No melena or hematochezia.  GENITOURINARY: No dysuria or hematuria.  ENDOCRINE: No polyuria or nocturia. No heat or cold intolerance.  HEMATOLOGY: No anemia. No bruising. No bleeding.  INTEGUMENTARY: No rashes. No lesions.  MUSCULOSKELETAL: No arthritis. No swelling. No gout.  NEUROLOGIC: No numbness, tingling, or ataxia. No seizure-type activity.  PSYCHIATRIC: No anxiety. No insomnia. No ADD.    Vitals:   Vitals:   10/28/16 1202 10/28/16 1215 10/28/16 1421 10/28/16 1440  BP: 124/64 112/62    Pulse: (!) 25 86  88  Resp:      Temp:      TempSrc:      SpO2:   100%   Weight:      Height:        Wt Readings from Last 3 Encounters:  10/28/16 128 lb 14.4 oz (58.5 kg)  10/21/16 137 lb 11.2 oz (62.5 kg)  10/11/16 149 lb 0.5 oz (67.6 kg)     Intake/Output Summary (Last 24 hours) at 10/28/16 1500 Last data filed at 10/28/16 1014  Gross per 24 hour  Intake            267.9  ml  Output              500 ml  Net           -232.1 ml    Physical Exam:   GENERAL: Pleasant-appearing in no apparent distress.  HEAD, EYES, EARS, NOSE AND THROAT: Atraumatic, normocephalic. Extraocular muscles are intact. Pupils equal and reactive to light. Sclerae anicteric. No conjunctival injection. No oro-pharyngeal erythema.  NECK: Supple. There is no jugular venous distention.  No bruits, no lymphadenopathy, no thyromegaly.  HEART: irregularly irregular. No murmurs, no rubs, no clicks.  LUNGS: bilateral rhonchus breath sounds ABDOMEN: Soft, flat, nontender, nondistended. Has good bowel sounds. No hepatosplenomegaly appreciated.  EXTREMITIES: No evidence of any cyanosis, clubbing, or peripheral edema.  +2 pedal and radial pulses bilaterally.  NEUROLOGIC: The patient is alert, awake, and oriented x3 with no focal motor or sensory deficits appreciated bilaterally.  SKIN: Moist and warm with no rashes appreciated.  Psych: Not anxious, depressed LN: No inguinal LN enlargement    Antibiotics   Anti-infectives    Start     Dose/Rate Route Frequency Ordered Stop   10/28/16 1700  levofloxacin (LEVAQUIN) tablet 750 mg     750 mg Oral Daily 10/28/16 0901     10/25/16 1700  levofloxacin (LEVAQUIN) IVPB 750 mg  Status:  Discontinued     750 mg 100 mL/hr over 90 Minutes Intravenous Every 24 hours 10/25/16 1646 10/28/16 0901      Medications   Scheduled Meds: . ALPRAZolam  0.25 mg Oral Daily  . aspirin EC  81 mg Oral Daily  . atorvastatin  10 mg Oral q1800  . diltiazem  30 mg Oral Q6H  . ezetimibe  10 mg Oral Daily  . feeding supplement (GLUCERNA SHAKE)  237 mL Oral TID BM  . guaiFENesin  600 mg Oral BID  . insulin pump   Subcutaneous TID AC, HS, 0200  . levofloxacin  750 mg Oral Daily  . multivitamin with minerals  1 tablet Oral Daily  . predniSONE  60 mg Oral Daily   Followed by  . [START ON 10/29/2016] predniSONE  50 mg Oral Q breakfast   Followed by  . [START ON 10/30/2016]  predniSONE  40 mg Oral Q breakfast   Followed by  . [START ON 10/31/2016] predniSONE  30 mg Oral Q breakfast   Followed by  . [START ON 11/01/2016] predniSONE  20 mg Oral Q breakfast   Followed by  . [START ON 11/02/2016] predniSONE  10 mg Oral Q breakfast  . sodium chloride flush  3 mL Intravenous Q12H  . sodium chloride HYPERTONIC  4 mL Nebulization TID   Continuous Infusions: . diltiazem (CARDIZEM) infusion 10 mg/hr (10/28/16 1439)   PRN Meds:.acetaminophen **OR** acetaminophen, ipratropium, ondansetron **OR** ondansetron (ZOFRAN) IV, oxyCODONE, polyethylene glycol   Data Review:   Micro Results Recent Results (from the past 240 hour(s))  Blood Culture (routine x 2)     Status: None (Preliminary result)   Collection Time: 10/25/16  4:34 PM  Result Value Ref Range Status   Specimen Description BLOOD LEFT ANTECUBITAL  Final   Special Requests   Final    BOTTLES DRAWN AEROBIC AND ANAEROBIC Blood Culture adequate volume   Culture NO GROWTH 3 DAYS  Final   Report Status PENDING  Incomplete  Blood Culture (routine x 2)     Status: None (Preliminary result)   Collection Time: 10/25/16  4:34 PM  Result Value Ref Range Status   Specimen Description BLOOD RIGHT ANTECUBITAL  Final   Special Requests   Final    BOTTLES DRAWN AEROBIC AND ANAEROBIC Blood Culture adequate volume   Culture NO GROWTH 3 DAYS  Final   Report Status PENDING  Incomplete    Radiology Reports Dg Chest 1 View  Result Date: 10/11/2016 CLINICAL DATA:  ET and NG tube placement. EXAM: CHEST 1 VIEW COMPARISON:  Chest x-ray from same day. FINDINGS: Unchanged positioning of the endotracheal tube with the  tip approximately 3.9 cm above the level of the carina. New enteric tube in place with the tip and distal side port in the gastric body. The cardiomediastinal silhouette is normal in size. No focal consolidation, pleural effusion, or pneumothorax. No acute osseous abnormality. IMPRESSION: 1. Interval placement of an  enteric tube with the tip and distal side port in the gastric body. 2.  No active cardiopulmonary disease. Electronically Signed   By: Titus Dubin M.D.   On: 10/11/2016 13:32   Dg Chest 1 View  Result Date: 10/11/2016 CLINICAL DATA:  Post intubation, former smoker, diabetes mellitus EXAM: CHEST 1 VIEW COMPARISON:  Portable exam 1039 hours without priors for comparison FINDINGS: Tip of endotracheal tube projects 4.8 cm above carina. External pacing leads project over chest. Normal heart size, mediastinal contours, and pulmonary vascularity. Lungs clear. No pleural effusion or pneumothorax. Bones demineralized. IMPRESSION: No acute abnormalities. Electronically Signed   By: Lavonia Dana M.D.   On: 10/11/2016 11:52   Ct Angio Chest Pe W Or Wo Contrast  Result Date: 10/25/2016 CLINICAL DATA:  Pt arrived POV for reports of SOB and cough. Pt was seen here recently and had anaphylactic reaction to Rocephin. Pt room air SpO2 84%, pt placed on 3L O2 via Anthony and SpO2 increased to 92%. Pt reports cough. Bilateral wheezes heard at lung bases EXAM: CT ANGIOGRAPHY CHEST WITH CONTRAST TECHNIQUE: Multidetector CT imaging of the chest was performed using the standard protocol during bolus administration of intravenous contrast. Multiplanar CT image reconstructions and MIPs were obtained to evaluate the vascular anatomy. CONTRAST:  75 cc Isovue 370 COMPARISON:  Chest x-ray 10/25/2016 FINDINGS: Cardiovascular: Pulmonary arteries are well opacified. There is no acute pulmonary embolus. There is extensive 3 vessel coronary artery calcification. Pericardial effusion is present, measuring approximately 11 mm in width. Pericardial effusion appears higher attenuation than simple water density and may contain proteinaceous material, debris, purulent, or bloody fluid. There is extensive atherosclerotic calcification of the thoracic aorta not associated with aneurysm. Mediastinum/Nodes: The visualized portion of the thyroid gland has  a normal appearance. Esophagus is normal. Small hiatal hernia. No significant mediastinal, hilar, or axillary adenopathy. Lungs/Pleura: There are numerous ground-glass opacities throughout the lungs bilaterally, primarily involving the upper lobes but also involving the left lower lobe. There is relative sparing of this opacity in the right lower lobe. There is atelectasis and right lower lobe obstruction by a mucus plug or soft tissue mass. There is bibasilar atelectasis. No significant pleural effusion. Upper Abdomen: Unremarkable. Musculoskeletal: There are Schmorl's nodes at numerous thoracic vertebral levels. Numerous bilateral rib fractures are identified, most of which are probably chronic. Old rib fractures are identified involving at least ribs 2 to through 7. Old right rib fractures are identified, involving at least ribs 2 through 6. Right fifth rib fracture appears acute. Correlation with history of trauma is recommended. Review of the MIP images confirms the above findings. IMPRESSION: 1. Technically adequate exam showing no acute pulmonary embolus. 2. Diffuse parenchymal alveolar infiltrates compatible with infectious or inflammatory process. 3. Right lower lobe bronchus is obstructed by a mucus plug or soft tissue mass. There is right lower lobe atelectasis and relative sparing of airspace filling opacities in the right lower lobe. 4. Cardiomegaly, extensive coronary artery disease, and high attenuation pericardial effusion. Considerations include hemopericardium, pericarditis, or other high attenuation material such as proteinaceous fluid. 5. Small hiatal hernia. 6. Multiple bilateral rib fractures. Right fifth rib fracture appears acute. Correlation with history of trauma is indicated.  Electronically Signed   By: Nolon Nations M.D.   On: 10/25/2016 16:21   US Renal  Result Date: 10/14/2016 CLINICAL DATA:  Kidney stone.  History of cardiac arrest. EXAM: RENAL / URINARY TRACT ULTRASOUND  COMPLETE COMPARISON:  Abdominal CT 10/11/2016 FINDINGS: Right Kidney: Length: 11.5 cm. 2.2 cm hilar cyst. No hydronephrosis. Borderline hyperechoic renal cortex. Left Kidney: Length: 12.5 cm. Mild hydronephrosis, improved from CT 3 days ago. No evidence of mass. Bladder: Decompressed by Foley catheter. IMPRESSION: Mild left hydronephrosis, improved from CT 3 days ago. Electronically Signed   By: Monte Fantasia M.D.   On: 10/14/2016 09:05   Ct Femur Right Wo Contrast  Result Date: 10/16/2016 CLINICAL DATA:  Right thigh swelling. Raising vascular catheterization. EXAM: CT OF THE LOWER RIGHT EXTREMITY WITHOUT CONTRAST TECHNIQUE: Multidetector CT imaging of the right lower extremity was performed according to the standard protocol. COMPARISON:  CT pelvis 10/11/2016 FINDINGS: Bones/Joint/Cartilage Degenerative arthropathy of both hips. Small bilateral knee joint effusions. Ligaments Suboptimally assessed by CT. Muscles and Tendons There is no focal hematoma around the common femoral vasculature, but there is considerable high density expansion of the right hip adductor musculature, most notably the pectineus, adductor brevis, and adductor magnus muscles. Comparing to the contralateral side, the volume of adductor hematoma on the right is estimated at 420 cubic cm (I obtain this by subtracting the volume of the normal left-sided muscles from the volume of the expanded right-sided adductor musculature). There are some lower density regions within the musculature likely representing mixed density hematoma. Infection and abscess is considered less likely. Soft tissues Subcutaneous edema noted in the right thigh. Edema tracks along deep fascia planes of the medial compartment of the right thigh. Does Foley catheter in the urinary bladder. IMPRESSION: 1. Expansion and high density in the right hip adductor musculature as detailed above, volume of suspected hematoma at 420 cubic cm. There is edema in surrounding fascia  planes and in the subcutaneous tissues of the right thigh. 2. No hematoma is observed along the common femoral vasculature directly. This raises suspicion that the right adductor hematoma is spontaneous. 3. Degenerative arthropathy of both hips. 4. Small bilateral knee joint effusions. Electronically Signed   By: Van Clines M.D.   On: 10/16/2016 14:03   Dg Chest Port 1 View  Result Date: 10/28/2016 CLINICAL DATA:  Shortness of breath. EXAM: PORTABLE CHEST 1 VIEW COMPARISON:  CT 10/25/2016.  Chest x-ray 10/25/2016 . FINDINGS: Mediastinum and hilar structures normal. Stable cardiomegaly. Multifocal bilateral pulmonary infiltrates, improved from prior exam. Small bilateral pleural effusions. No pneumothorax. IMPRESSION: 1. Multifocal bilateral pulmonary infiltrates, slightly improved from prior CT and chest x-ray of 10/25/2016. Continued follow-up exams can be obtained. Small bilateral pleural effusions. 2. Stable cardiomegaly . Electronically Signed   By: Marcello Moores  Register   On: 10/28/2016 08:24   Dg Chest Port 1 View  Result Date: 10/25/2016 CLINICAL DATA:  reports of SOB and cough. Pt was seen here recently and had anaphylactic reaction to rocephin last week, history of CAD, NSTEMI, diabetes, ventricular fibrillation EXAM: PORTABLE CHEST 1 VIEW COMPARISON:  10/19/2016 FINDINGS: There is elevation of the RIGHT hemidiaphragm not changed from 10/19/2016 but new from 10/11/2016. Normal cardiac silhouette. Lungs relatively clear. Mild RIGHT basilar atelectasis. No acute osseous abnormality. IMPRESSION: Elevation of the RIGHT hemidiaphragm is new from 10/11/2016 with differential including subpulmonic effusion versus potential diaphragmatic paralysis. Electronically Signed   By: Suzy Bouchard M.D.   On: 10/25/2016 15:11   Dg Chest Port 1  View  Result Date: 10/19/2016 CLINICAL DATA:  Acute respiratory failure, hypoxia EXAM: PORTABLE CHEST 1 VIEW COMPARISON:  10/18/2016 FINDINGS: Right base atelectasis  with mildly elevated right hemidiaphragm. Cardiomegaly. Left lung is clear. No effusions or acute bony abnormality. IMPRESSION: Right base atelectasis.  Mild cardiomegaly. Electronically Signed   By: Rolm Baptise M.D.   On: 10/19/2016 08:42   Dg Chest Port 1 View  Result Date: 10/18/2016 CLINICAL DATA:  Acute respiratory failure EXAM: PORTABLE CHEST 1 VIEW COMPARISON:  10/17/2016 FINDINGS: Cardiac shadow is mildly enlarged but stable. Aortic calcifications are again seen. Lungs are well-aerated with right basilar atelectatic changes. Some mild interstitial changes are seen particularly on the left but stable. No new focal abnormality is seen. IMPRESSION: No significant interval change from the prior exam. Electronically Signed   By: Inez Catalina M.D.   On: 10/18/2016 07:56   Dg Chest Port 1 View  Result Date: 10/17/2016 CLINICAL DATA:  Cough, acute respiratory failure, hypoxia EXAM: PORTABLE CHEST 1 VIEW COMPARISON:  10/16/2016 FINDINGS: Cardiomegaly. Diffuse left lung airspace disease, most confluent in the left upper lobe. Right basilar atelectasis or infiltrate. Findings are similar to prior study. Suspect small right effusion. IMPRESSION: Stable bilateral airspace opacities, left greater than right. Suspect small effusion on the right. No real change. Electronically Signed   By: Rolm Baptise M.D.   On: 10/17/2016 07:46   Dg Chest Port 1 View  Result Date: 10/16/2016 CLINICAL DATA:  Shortness of breath. EXAM: PORTABLE CHEST 1 VIEW COMPARISON:  October 15, 2016 FINDINGS: Left greater than right pulmonary opacities persist, improved in the interval. No other interval changes. IMPRESSION: Improving but persistent left greater than right pulmonary opacities. Electronically Signed   By: Dorise Bullion III M.D   On: 10/16/2016 08:40   Dg Chest Port 1 View  Result Date: 10/15/2016 CLINICAL DATA:  Followup acute respiratory failure. EXAM: PORTABLE CHEST 1 VIEW COMPARISON:  10/14/2016 FINDINGS:  Widespread bilateral pneumonia persists, with slight radiographic improvement. Small amount of a fusion and focal volume loss in the right lower lobe. No worsening or new findings. IMPRESSION: Radiographic improvement of diffuse pneumonia. Some persistent volume loss at the right base. Electronically Signed   By: Nelson Chimes M.D.   On: 10/15/2016 07:49   Dg Chest Port 1 View  Result Date: 10/14/2016 CLINICAL DATA:  66 y/o  F; respiratory distress. EXAM: PORTABLE CHEST 1 VIEW COMPARISON:  10/13/2016 chest radiograph FINDINGS: Asymmetric consolidations of the lungs are stable from prior chest radiographs. Small to moderate right pleural effusion. Stable cardiac silhouette within normal limits. Aortic atherosclerosis with calcification. No acute osseous abnormality. IMPRESSION: Stable asymmetric consolidations throughout the lungs and small to moderate right effusion. Differential includes multifocal pneumonia and pulmonary edema. Electronically Signed   By: Kristine Garbe M.D.   On: 10/14/2016 01:30   Dg Chest Port 1 View  Result Date: 10/13/2016 CLINICAL DATA:  Shortness of breath today EXAM: PORTABLE CHEST 1 VIEW COMPARISON:  Earlier today FINDINGS: Continued progression of bilateral airspace disease, asymmetric to the left. Normal heart size for technique. Negative mediastinal contours. Small if any pleural effusions. No pneumothorax. IMPRESSION: Progression of asymmetric bilateral airspace disease favoring pneumonia or noncardiogenic edema (was there aspiration at time of cardiac arrest?). Electronically Signed   By: Monte Fantasia M.D.   On: 10/13/2016 13:44   Dg Chest Port 1 View  Result Date: 10/13/2016 CLINICAL DATA:  Respiratory failure. EXAM: PORTABLE CHEST 1 VIEW COMPARISON:  10/11/2016. FINDINGS: Interim removal of endotracheal  tube and NG tube. Heart size normal. New onset of diffuse bilateral pulmonary interstitial prominence noted. Findings consistent with a process such as  interstitial pneumonitis or interstitial edema. Small right pleural effusion. No pneumothorax . IMPRESSION: 1.  Interim removal of endotracheal tube and NG tube. 2. New onset of diffuse bilateral pulmonary interstitial prominence consistent with a process such as interstitial pneumonitis or interstitial edema. Small right pleural effusion. Electronically Signed   By: Marcello Moores  Register   On: 10/13/2016 06:19   Dg Abd Portable 1v  Result Date: 10/14/2016 CLINICAL DATA:  Ureteral calculus, abdominal pain EXAM: PORTABLE ABDOMEN - 1 VIEW COMPARISON:  CT abdomen and pelvis 10/11/2016 FINDINGS: LEFT pelvic phleboliths again identified. Distal LEFT ureteral calculus seen on prior CT exam is not visualized on current study. RIGHT femoral line noted. Bowel gas pattern normal. Diffuse osseous demineralization. IMPRESSION: Previously identified distal LEFT ureteral calculus is no longer seen. Electronically Signed   By: Lavonia Dana M.D.   On: 10/14/2016 07:40   Ct Renal Stone Study  Result Date: 10/11/2016 CLINICAL DATA:  Left flank pain.  History of renal stones. EXAM: CT ABDOMEN AND PELVIS WITHOUT CONTRAST TECHNIQUE: Multidetector CT imaging of the abdomen and pelvis was performed following the standard protocol without IV contrast. COMPARISON:  None. FINDINGS: Lower chest: Minimal atelectasis in the lung bases. No pleural effusion. Three-vessel coronary artery atherosclerosis. Normal heart size. No pericardial effusion. Hepatobiliary: No focal liver abnormality is seen. No gallstones, gallbladder wall thickening, or biliary dilatation. Pancreas: Mildly truncated appearance of the pancreatic tail. No ductal dilatation or surrounding inflammatory changes. Spleen: Unremarkable. Adrenals/Urinary Tract: Unremarkable adrenal glands. Punctate nonobstructing calculus in the lower pole of the right kidney. Three punctate nonobstructing left renal calculi. 4 mm obstructing calculus in the distal left ureter near the UVJ  resulting in mild hydroureteronephrosis. Mild left perinephric stranding. Unremarkable bladder. Stomach/Bowel: Small sliding hiatal hernia. No evidence of bowel obstruction or inflammation. Unremarkable appendix. Vascular/Lymphatic: Extensive atherosclerosis of the abdominal aorta with mild infrarenal aortic ectasia measuring up to 2.8 cm diameter. Central displacement of intimal calcification more distally in the aorta over a length of 2.5 cm suggests a short segment dissection. No enlarged lymph nodes. Reproductive: 9 mm calcification in the uterine fundus likely reflecting a small fibroid. Unremarkable ovaries. Other: No intraperitoneal free fluid. Small fat containing umbilical hernia. Musculoskeletal: Mild L4 and L5 superior endplate compression fractures, chronic in appearance. Mild spondylosis and moderate posterior element hypertrophy in the lower lumbar spine with likely mild spinal stenosis at L3-4 and L4-5. IMPRESSION: 1. 4 mm distal left ureteral calculus with mild hydroureteronephrosis. 2. Punctate nonobstructing bilateral renal calculi. 3. Small hiatal hernia. 4. Aortic Atherosclerosis (ICD10-I70.0). Mild infrarenal aortic ectasia and suspected short segment aortic dissection. Electronically Signed   By: Logan Bores M.D.   On: 10/11/2016 08:20     CBC  Recent Labs Lab 10/25/16 1441 10/26/16 0400 10/27/16 0614  WBC 17.6* 14.5* 21.6*  HGB 10.2* 10.7* 9.9*  HCT 30.4* 31.2* 28.8*  PLT 555* 576* 579*  MCV 85.2 85.2 84.7  MCH 28.5 29.1 29.2  MCHC 33.5 34.1 34.5  RDW 16.2* 16.0* 16.2*    Chemistries   Recent Labs Lab 10/25/16 1441 10/26/16 0400 10/27/16 0614 10/28/16 0425  NA 127* 129* 129* 130*  K 3.5 4.1 4.6 5.0  CL 90* 95* 92* 93*  CO2 25 23 28 30   GLUCOSE 187* 237* 330* 306*  BUN 13 15 21* 25*  CREATININE 0.52 0.51 0.45 0.55  CALCIUM 8.8*  8.4* 8.3* 8.2*  MG 1.6* 1.5*  --   --   AST 47* 47*  --   --   ALT 38 41  --   --   ALKPHOS 109 112  --   --   BILITOT 2.0* 1.4*   --   --    ------------------------------------------------------------------------------------------------------------------ estimated creatinine clearance is 58.8 mL/min (by C-G formula based on SCr of 0.55 mg/dL). ------------------------------------------------------------------------------------------------------------------ No results for input(s): HGBA1C in the last 72 hours. ------------------------------------------------------------------------------------------------------------------ No results for input(s): CHOL, HDL, LDLCALC, TRIG, CHOLHDL, LDLDIRECT in the last 72 hours. ------------------------------------------------------------------------------------------------------------------ No results for input(s): TSH, T4TOTAL, T3FREE, THYROIDAB in the last 72 hours.  Invalid input(s): FREET3 ------------------------------------------------------------------------------------------------------------------ No results for input(s): VITAMINB12, FOLATE, FERRITIN, TIBC, IRON, RETICCTPCT in the last 72 hours.  Coagulation profile No results for input(s): INR, PROTIME in the last 168 hours.  No results for input(s): DDIMER in the last 72 hours.  Cardiac Enzymes  Recent Labs Lab 10/25/16 1441 10/28/16 0714 10/28/16 1248  TROPONINI 0.03* <0.03 <0.03   ------------------------------------------------------------------------------------------------------------------ Invalid input(s): POCBNP    Assessment & Plan  Patient is a 66 year old with recent CPR  Acute hypoxic respiratory failure due to bilateral pneumonitis and COPD exacerbation -Continue IV steroids, Antibiotics - Scheduled Nebulizers - Inhalers --due to CT scan showing possible mucus plugging versus mass on the bronchus pulmonary saw the patient they feel she is too high risk to do a bronchoscopy, recommends continuing therapy outpatient follow-up with repeat CT   *  atrial fibrillation with rapid ventricular  rate Continue IV Cardizem I have given a dose of IV digoxin cardiology following  * diabetes mellitus. Continue insulin pump as doing at home, bg elevated to steroids  * CAD with multivessel disease. Not a good candidate for CABG due to pulmonary issues according to recent cardiothoracic. Cardiology following Continue aspirin   *hyperlipidemia unspecified continue Lipitor  * DVT prophylaxis. SCDs       Code Status Orders        Start     Ordered   10/25/16 1649  Full code  Continuous     10/25/16 1651    Code Status History    Date Active Date Inactive Code Status Order ID Comments User Context   10/13/2016 11:29 AM 10/21/2016  6:46 PM Full Code [id number]  Rise Mu, PA-C Inpatient   10/11/2016 12:26 PM 10/13/2016 10:48 AM Full Code [id number]  Nelva Bush, MD Inpatient   10/11/2016 11:15 AM 10/11/2016 12:26 PM Full Code [id number]  Wilhelmina Mcardle, MD Inpatient           Consultscardiology  DVT Prophylaxis  SCDs  Lab Results  Component Value Date   PLT 579 (H) 10/27/2016     Time Spent in minutes   63min  Greater than 50% of time spent in care coordination and counseling patient regarding the condition and plan of care.   Dustin Flock M.D on 10/28/2016 at 3:00 PM  Between 7am to 6pm - Pager - [phone]  After 6pm go to [email or web address] - password [email or web address] [facility]   Office  [phone]

## 2016-10-28 NOTE — Care Management (Signed)
Updated patient's husband that [facility]ws not found agency for home health.  Sent fax to [facility] with updated information regarding need for cardizem drip

## 2016-10-28 NOTE — Progress Notes (Signed)
[facility] responded to a PG for a Rapid Response. Pt was being attended to upon my arrival and seemed to be in a good space. Pt and AC indicated that there was no need for spiritual care at this time.     10/28/16 0600  Clinical Encounter Type  Visited With Patient;Health care provider  Visit Type Initial;Spiritual support;Code (Rapid Response)  Referral From Nurse  Consult/Referral To Chaplain

## 2016-10-28 NOTE — Progress Notes (Signed)
Talked to Dr. Verdell Carmine about patient's HR at 80's converted to NSR at 1600, patient on cardizem drip and cardizem p.o, BP was 108/63 order to discontinue cardizem drip. Also talked about patient's blood sugar of 346 at 1734 patient administer corrected dose per her insulin pump and patient on oral prednisone. No other concern at the moment. RN will continue to monitor.

## 2016-10-28 NOTE — Care Management (Signed)
[facility] has contacted this CM three times already this day to discuss alternative disposition if unable to find home health agency to accept patient for service.  Informed all three callers that there is no alternative plan other than returning home without any services which did not work well for her when another facility attempted to set up home health with [facility] and declined.  Staff is not able to performed home 02 assessment because patient is now restricted to bedrest due to atrial fib with rvr requiring cardizem drip

## 2016-10-29 LAB — GLUCOSE, CAPILLARY
GLUCOSE-CAPILLARY: 326 mg/dL — AB (ref 65–99)
Glucose-Capillary: 274 mg/dL — ABNORMAL HIGH (ref 65–99)
Glucose-Capillary: 345 mg/dL — ABNORMAL HIGH (ref 65–99)
Glucose-Capillary: 317 mg/dL — ABNORMAL HIGH (ref 65–99)

## 2016-10-29 LAB — BASIC METABOLIC PANEL
ANION GAP: 7 (ref 5–15)
BUN: 29 mg/dL — AB (ref 6–20)
CO2: 29 mmol/L (ref 22–32)
Calcium: 8.5 mg/dL — ABNORMAL LOW (ref 8.9–10.3)
Chloride: 93 mmol/L — ABNORMAL LOW (ref 101–111)
Creatinine, Ser: 0.5 mg/dL (ref 0.44–1.00)
GFR calc Af Amer: >60 mL/min (ref >60)
GLUCOSE: 322 mg/dL — AB (ref 65–99)
POTASSIUM: 4.7 mmol/L (ref 3.5–5.1)
Sodium: 129 mmol/L — ABNORMAL LOW (ref 135–145)

## 2016-10-29 NOTE — Progress Notes (Signed)
[facility]                                                                                                                                                                                  Patient Demographics   Lisa [name], is a 66 y.o. female, DOB - [birth year], ELF:[id number]  Admit date - 10/25/2016   Admitting Physician Hillary Bow, MD  Outpatient Primary MD for the patient is Morayati, Lourdes Sledge, MD   LOS - 4  Subjective: No shortness of breath, heart rate is better. Denies any complaints.   Review of Systems:   CONSTITUTIONAL: No documented fever. No fatigue, weakness. No weight gain, no weight loss.  EYES: No blurry or double vision.  ENT: No tinnitus. No postnasal drip. No redness of the oropharynx.  RESPIRATORY: positive cough, no wheeze, no hemoptysis.positive dyspnea.  CARDIOVASCULAR: No chest pain. No orthopnea. No palpitations. No syncope.  GASTROINTESTINAL: No nausea, no vomiting or diarrhea. No abdominal pain. No melena or hematochezia.  GENITOURINARY: No dysuria or hematuria.  ENDOCRINE: No polyuria or nocturia. No heat or cold intolerance.  HEMATOLOGY: No anemia. No bruising. No bleeding.  INTEGUMENTARY: No rashes. No lesions.  MUSCULOSKELETAL: No arthritis. No swelling. No gout.  NEUROLOGIC: No numbness, tingling, or ataxia. No seizure-type activity.  PSYCHIATRIC: No anxiety. No insomnia. No ADD.    Vitals:   Vitals:   10/28/16 1946 10/28/16 2045 10/29/16 0507 10/29/16 0813  BP:   (!) 141/78 139/68  Pulse: 86  87 88  Resp: 17  18 18   Temp: 98.1 F (36.7 C)  98 F (36.7 C) 97.7 F (36.5 C)  TempSrc: Oral  Oral Oral  SpO2: 99% 94% 97%   Weight:   57.6 kg (127 lb)   Height:        Wt Readings from Last 3 Encounters:  10/29/16 57.6 kg (127 lb)  10/21/16 62.5 kg (137 lb 11.2 oz)  10/11/16 67.6 kg (149 lb 0.5 oz)     Intake/Output Summary (Last 24 hours) at 10/29/16 1135 Last data filed at 10/29/16 0400   Gross per 24 hour  Intake                0 ml  Output             1200 ml  Net            -1200 ml    Physical Exam:   GENERAL: Pleasant-appearing in no apparent distress.  HEAD, EYES, EARS, NOSE AND THROAT: Atraumatic, normocephalic. Extraocular muscles are intact. Pupils equal and reactive to light. Sclerae anicteric. No conjunctival injection. No oro-pharyngeal erythema.  NECK: Supple. There  is no jugular venous distention. No bruits, no lymphadenopathy, no thyromegaly.  HEART: irregularly irregular. No murmurs, no rubs, no clicks.  LUNGS: Bilaterally clear to auscultation. No wheezing, rales. ABDOMEN: Soft, flat, nontender, nondistended. Has good bowel sounds. No hepatosplenomegaly appreciated.  EXTREMITIES: No evidence of any cyanosis, clubbing, or peripheral edema.  +2 pedal and radial pulses bilaterally.  NEUROLOGIC: The patient is alert, awake, and oriented x3 with no focal motor or sensory deficits appreciated bilaterally.  SKIN: Moist and warm with no rashes appreciated.  Psych: Not anxious, depressed LN: No inguinal LN enlargement    Antibiotics   Anti-infectives    Start     Dose/Rate Route Frequency Ordered Stop   10/28/16 1700  levofloxacin (LEVAQUIN) tablet 750 mg     750 mg Oral Daily 10/28/16 0901     10/25/16 1700  levofloxacin (LEVAQUIN) IVPB 750 mg  Status:  Discontinued     750 mg 100 mL/hr over 90 Minutes Intravenous Every 24 hours 10/25/16 1646 10/28/16 0901      Medications   Scheduled Meds: . ALPRAZolam  0.25 mg Oral Daily  . aspirin EC  81 mg Oral Daily  . atorvastatin  10 mg Oral q1800  . diltiazem  30 mg Oral Q6H  . ezetimibe  10 mg Oral Daily  . feeding supplement (GLUCERNA SHAKE)  237 mL Oral TID BM  . guaiFENesin  600 mg Oral BID  . insulin pump   Subcutaneous TID AC, HS, 0200  . levofloxacin  750 mg Oral Daily  . multivitamin with minerals  1 tablet Oral Daily  . [START ON 10/30/2016] predniSONE  40 mg Oral Q breakfast   Followed by  .  [START ON 10/31/2016] predniSONE  30 mg Oral Q breakfast   Followed by  . [START ON 11/01/2016] predniSONE  20 mg Oral Q breakfast   Followed by  . [START ON 11/02/2016] predniSONE  10 mg Oral Q breakfast  . sodium chloride flush  3 mL Intravenous Q12H  . sodium chloride HYPERTONIC  4 mL Nebulization TID   Continuous Infusions:  PRN Meds:.acetaminophen **OR** acetaminophen, ipratropium, ondansetron **OR** ondansetron (ZOFRAN) IV, oxyCODONE, polyethylene glycol   Data Review:   Micro Results Recent Results (from the past 240 hour(s))  Blood Culture (routine x 2)     Status: None (Preliminary result)   Collection Time: 10/25/16  4:34 PM  Result Value Ref Range Status   Specimen Description BLOOD LEFT ANTECUBITAL  Final   Special Requests   Final    BOTTLES DRAWN AEROBIC AND ANAEROBIC Blood Culture adequate volume   Culture NO GROWTH 4 DAYS  Final   Report Status PENDING  Incomplete  Blood Culture (routine x 2)     Status: None (Preliminary result)   Collection Time: 10/25/16  4:34 PM  Result Value Ref Range Status   Specimen Description BLOOD RIGHT ANTECUBITAL  Final   Special Requests   Final    BOTTLES DRAWN AEROBIC AND ANAEROBIC Blood Culture adequate volume   Culture NO GROWTH 4 DAYS  Final   Report Status PENDING  Incomplete    Radiology Reports Dg Chest 1 View  Result Date: 10/11/2016 CLINICAL DATA:  ET and NG tube placement. EXAM: CHEST 1 VIEW COMPARISON:  Chest x-ray from same day. FINDINGS: Unchanged positioning of the endotracheal tube with the tip approximately 3.9 cm above the level of the carina. New enteric tube in place with the tip and distal side port in the gastric body. The cardiomediastinal silhouette  is normal in size. No focal consolidation, pleural effusion, or pneumothorax. No acute osseous abnormality. IMPRESSION: 1. Interval placement of an enteric tube with the tip and distal side port in the gastric body. 2.  No active cardiopulmonary disease.  Electronically Signed   By: Titus Dubin M.D.   On: 10/11/2016 13:32   Dg Chest 1 View  Result Date: 10/11/2016 CLINICAL DATA:  Post intubation, former smoker, diabetes mellitus EXAM: CHEST 1 VIEW COMPARISON:  Portable exam 1039 hours without priors for comparison FINDINGS: Tip of endotracheal tube projects 4.8 cm above carina. External pacing leads project over chest. Normal heart size, mediastinal contours, and pulmonary vascularity. Lungs clear. No pleural effusion or pneumothorax. Bones demineralized. IMPRESSION: No acute abnormalities. Electronically Signed   By: Lavonia Dana M.D.   On: 10/11/2016 11:52   Ct Angio Chest Pe W Or Wo Contrast  Result Date: 10/25/2016 CLINICAL DATA:  Pt arrived POV for reports of SOB and cough. Pt was seen here recently and had anaphylactic reaction to Rocephin. Pt room air SpO2 84%, pt placed on 3L O2 via [place] and SpO2 increased to 92%. Pt reports cough. Bilateral wheezes heard at lung bases EXAM: CT ANGIOGRAPHY CHEST WITH CONTRAST TECHNIQUE: Multidetector CT imaging of the chest was performed using the standard protocol during bolus administration of intravenous contrast. Multiplanar CT image reconstructions and MIPs were obtained to evaluate the vascular anatomy. CONTRAST:  75 cc Isovue 370 COMPARISON:  Chest x-ray 10/25/2016 FINDINGS: Cardiovascular: Pulmonary arteries are well opacified. There is no acute pulmonary embolus. There is extensive 3 vessel coronary artery calcification. Pericardial effusion is present, measuring approximately 11 mm in width. Pericardial effusion appears higher attenuation than simple water density and may contain proteinaceous material, debris, purulent, or bloody fluid. There is extensive atherosclerotic calcification of the thoracic aorta not associated with aneurysm. Mediastinum/Nodes: The visualized portion of the thyroid gland has a normal appearance. Esophagus is normal. Small hiatal hernia. No significant mediastinal, hilar, or  axillary adenopathy. Lungs/Pleura: There are numerous ground-glass opacities throughout the lungs bilaterally, primarily involving the upper lobes but also involving the left lower lobe. There is relative sparing of this opacity in the right lower lobe. There is atelectasis and right lower lobe obstruction by a mucus plug or soft tissue mass. There is bibasilar atelectasis. No significant pleural effusion. Upper Abdomen: Unremarkable. Musculoskeletal: There are Schmorl's nodes at numerous thoracic vertebral levels. Numerous bilateral rib fractures are identified, most of which are probably chronic. Old rib fractures are identified involving at least ribs 2 to through 7. Old right rib fractures are identified, involving at least ribs 2 through 6. Right fifth rib fracture appears acute. Correlation with history of trauma is recommended. Review of the MIP images confirms the above findings. IMPRESSION: 1. Technically adequate exam showing no acute pulmonary embolus. 2. Diffuse parenchymal alveolar infiltrates compatible with infectious or inflammatory process. 3. Right lower lobe bronchus is obstructed by a mucus plug or soft tissue mass. There is right lower lobe atelectasis and relative sparing of airspace filling opacities in the right lower lobe. 4. Cardiomegaly, extensive coronary artery disease, and high attenuation pericardial effusion. Considerations include hemopericardium, pericarditis, or other high attenuation material such as proteinaceous fluid. 5. Small hiatal hernia. 6. Multiple bilateral rib fractures. Right fifth rib fracture appears acute. Correlation with history of trauma is indicated. Electronically Signed   By: Nolon Nations M.D.   On: 10/25/2016 16:21   US Renal  Result Date: 10/14/2016 CLINICAL DATA:  Kidney stone.  History  of cardiac arrest. EXAM: RENAL / URINARY TRACT ULTRASOUND COMPLETE COMPARISON:  Abdominal CT 10/11/2016 FINDINGS: Right Kidney: Length: 11.5 cm. 2.2 cm hilar cyst. No  hydronephrosis. Borderline hyperechoic renal cortex. Left Kidney: Length: 12.5 cm. Mild hydronephrosis, improved from CT 3 days ago. No evidence of mass. Bladder: Decompressed by Foley catheter. IMPRESSION: Mild left hydronephrosis, improved from CT 3 days ago. Electronically Signed   By: Monte Fantasia M.D.   On: 10/14/2016 09:05   Ct Femur Right Wo Contrast  Result Date: 10/16/2016 CLINICAL DATA:  Right thigh swelling. Raising vascular catheterization. EXAM: CT OF THE LOWER RIGHT EXTREMITY WITHOUT CONTRAST TECHNIQUE: Multidetector CT imaging of the right lower extremity was performed according to the standard protocol. COMPARISON:  CT pelvis 10/11/2016 FINDINGS: Bones/Joint/Cartilage Degenerative arthropathy of both hips. Small bilateral knee joint effusions. Ligaments Suboptimally assessed by CT. Muscles and Tendons There is no focal hematoma around the common femoral vasculature, but there is considerable high density expansion of the right hip adductor musculature, most notably the pectineus, adductor brevis, and adductor magnus muscles. Comparing to the contralateral side, the volume of adductor hematoma on the right is estimated at 420 cubic cm (I obtain this by subtracting the volume of the normal left-sided muscles from the volume of the expanded right-sided adductor musculature). There are some lower density regions within the musculature likely representing mixed density hematoma. Infection and abscess is considered less likely. Soft tissues Subcutaneous edema noted in the right thigh. Edema tracks along deep fascia planes of the medial compartment of the right thigh. Does Foley catheter in the urinary bladder. IMPRESSION: 1. Expansion and high density in the right hip adductor musculature as detailed above, volume of suspected hematoma at 420 cubic cm. There is edema in surrounding fascia planes and in the subcutaneous tissues of the right thigh. 2. No hematoma is observed along the common femoral  vasculature directly. This raises suspicion that the right adductor hematoma is spontaneous. 3. Degenerative arthropathy of both hips. 4. Small bilateral knee joint effusions. Electronically Signed   By: Van Clines M.D.   On: 10/16/2016 14:03   Dg Chest Port 1 View  Result Date: 10/28/2016 CLINICAL DATA:  Shortness of breath. EXAM: PORTABLE CHEST 1 VIEW COMPARISON:  CT 10/25/2016.  Chest x-ray 10/25/2016 . FINDINGS: Mediastinum and hilar structures normal. Stable cardiomegaly. Multifocal bilateral pulmonary infiltrates, improved from prior exam. Small bilateral pleural effusions. No pneumothorax. IMPRESSION: 1. Multifocal bilateral pulmonary infiltrates, slightly improved from prior CT and chest x-ray of 10/25/2016. Continued follow-up exams can be obtained. Small bilateral pleural effusions. 2. Stable cardiomegaly . Electronically Signed   By: Marcello Moores  Register   On: 10/28/2016 08:24   Dg Chest Port 1 View  Result Date: 10/25/2016 CLINICAL DATA:  reports of SOB and cough. Pt was seen here recently and had anaphylactic reaction to rocephin last week, history of CAD, NSTEMI, diabetes, ventricular fibrillation EXAM: PORTABLE CHEST 1 VIEW COMPARISON:  10/19/2016 FINDINGS: There is elevation of the RIGHT hemidiaphragm not changed from 10/19/2016 but new from 10/11/2016. Normal cardiac silhouette. Lungs relatively clear. Mild RIGHT basilar atelectasis. No acute osseous abnormality. IMPRESSION: Elevation of the RIGHT hemidiaphragm is new from 10/11/2016 with differential including subpulmonic effusion versus potential diaphragmatic paralysis. Electronically Signed   By: Suzy Bouchard M.D.   On: 10/25/2016 15:11   Dg Chest Port 1 View  Result Date: 10/19/2016 CLINICAL DATA:  Acute respiratory failure, hypoxia EXAM: PORTABLE CHEST 1 VIEW COMPARISON:  10/18/2016 FINDINGS: Right base atelectasis with mildly elevated right hemidiaphragm.  Cardiomegaly. Left lung is clear. No effusions or acute bony  abnormality. IMPRESSION: Right base atelectasis.  Mild cardiomegaly. Electronically Signed   By: Rolm Baptise M.D.   On: 10/19/2016 08:42   Dg Chest Port 1 View  Result Date: 10/18/2016 CLINICAL DATA:  Acute respiratory failure EXAM: PORTABLE CHEST 1 VIEW COMPARISON:  10/17/2016 FINDINGS: Cardiac shadow is mildly enlarged but stable. Aortic calcifications are again seen. Lungs are well-aerated with right basilar atelectatic changes. Some mild interstitial changes are seen particularly on the left but stable. No new focal abnormality is seen. IMPRESSION: No significant interval change from the prior exam. Electronically Signed   By: Inez Catalina M.D.   On: 10/18/2016 07:56   Dg Chest Port 1 View  Result Date: 10/17/2016 CLINICAL DATA:  Cough, acute respiratory failure, hypoxia EXAM: PORTABLE CHEST 1 VIEW COMPARISON:  10/16/2016 FINDINGS: Cardiomegaly. Diffuse left lung airspace disease, most confluent in the left upper lobe. Right basilar atelectasis or infiltrate. Findings are similar to prior study. Suspect small right effusion. IMPRESSION: Stable bilateral airspace opacities, left greater than right. Suspect small effusion on the right. No real change. Electronically Signed   By: Rolm Baptise M.D.   On: 10/17/2016 07:46   Dg Chest Port 1 View  Result Date: 10/16/2016 CLINICAL DATA:  Shortness of breath. EXAM: PORTABLE CHEST 1 VIEW COMPARISON:  October 15, 2016 FINDINGS: Left greater than right pulmonary opacities persist, improved in the interval. No other interval changes. IMPRESSION: Improving but persistent left greater than right pulmonary opacities. Electronically Signed   By: Dorise Bullion III M.D   On: 10/16/2016 08:40   Dg Chest Port 1 View  Result Date: 10/15/2016 CLINICAL DATA:  Followup acute respiratory failure. EXAM: PORTABLE CHEST 1 VIEW COMPARISON:  10/14/2016 FINDINGS: Widespread bilateral pneumonia persists, with slight radiographic improvement. Small amount of a fusion and  focal volume loss in the right lower lobe. No worsening or new findings. IMPRESSION: Radiographic improvement of diffuse pneumonia. Some persistent volume loss at the right base. Electronically Signed   By: Nelson Chimes M.D.   On: 10/15/2016 07:49   Dg Chest Port 1 View  Result Date: 10/14/2016 CLINICAL DATA:  66 y/o  F; respiratory distress. EXAM: PORTABLE CHEST 1 VIEW COMPARISON:  10/13/2016 chest radiograph FINDINGS: Asymmetric consolidations of the lungs are stable from prior chest radiographs. Small to moderate right pleural effusion. Stable cardiac silhouette within normal limits. Aortic atherosclerosis with calcification. No acute osseous abnormality. IMPRESSION: Stable asymmetric consolidations throughout the lungs and small to moderate right effusion. Differential includes multifocal pneumonia and pulmonary edema. Electronically Signed   By: Kristine Garbe M.D.   On: 10/14/2016 01:30   Dg Chest Port 1 View  Result Date: 10/13/2016 CLINICAL DATA:  Shortness of breath today EXAM: PORTABLE CHEST 1 VIEW COMPARISON:  Earlier today FINDINGS: Continued progression of bilateral airspace disease, asymmetric to the left. Normal heart size for technique. Negative mediastinal contours. Small if any pleural effusions. No pneumothorax. IMPRESSION: Progression of asymmetric bilateral airspace disease favoring pneumonia or noncardiogenic edema (was there aspiration at time of cardiac arrest?). Electronically Signed   By: Monte Fantasia M.D.   On: 10/13/2016 13:44   Dg Chest Port 1 View  Result Date: 10/13/2016 CLINICAL DATA:  Respiratory failure. EXAM: PORTABLE CHEST 1 VIEW COMPARISON:  10/11/2016. FINDINGS: Interim removal of endotracheal tube and NG tube. Heart size normal. New onset of diffuse bilateral pulmonary interstitial prominence noted. Findings consistent with a process such as interstitial pneumonitis or interstitial edema. Small  right pleural effusion. No pneumothorax . IMPRESSION: 1.   Interim removal of endotracheal tube and NG tube. 2. New onset of diffuse bilateral pulmonary interstitial prominence consistent with a process such as interstitial pneumonitis or interstitial edema. Small right pleural effusion. Electronically Signed   By: Marcello Moores  Register   On: 10/13/2016 06:19   Dg Abd Portable 1v  Result Date: 10/14/2016 CLINICAL DATA:  Ureteral calculus, abdominal pain EXAM: PORTABLE ABDOMEN - 1 VIEW COMPARISON:  CT abdomen and pelvis 10/11/2016 FINDINGS: LEFT pelvic phleboliths again identified. Distal LEFT ureteral calculus seen on prior CT exam is not visualized on current study. RIGHT femoral line noted. Bowel gas pattern normal. Diffuse osseous demineralization. IMPRESSION: Previously identified distal LEFT ureteral calculus is no longer seen. Electronically Signed   By: Lavonia Dana M.D.   On: 10/14/2016 07:40   Ct Renal Stone Study  Result Date: 10/11/2016 CLINICAL DATA:  Left flank pain.  History of renal stones. EXAM: CT ABDOMEN AND PELVIS WITHOUT CONTRAST TECHNIQUE: Multidetector CT imaging of the abdomen and pelvis was performed following the standard protocol without IV contrast. COMPARISON:  None. FINDINGS: Lower chest: Minimal atelectasis in the lung bases. No pleural effusion. Three-vessel coronary artery atherosclerosis. Normal heart size. No pericardial effusion. Hepatobiliary: No focal liver abnormality is seen. No gallstones, gallbladder wall thickening, or biliary dilatation. Pancreas: Mildly truncated appearance of the pancreatic tail. No ductal dilatation or surrounding inflammatory changes. Spleen: Unremarkable. Adrenals/Urinary Tract: Unremarkable adrenal glands. Punctate nonobstructing calculus in the lower pole of the right kidney. Three punctate nonobstructing left renal calculi. 4 mm obstructing calculus in the distal left ureter near the UVJ resulting in mild hydroureteronephrosis. Mild left perinephric stranding. Unremarkable bladder. Stomach/Bowel: Small  sliding hiatal hernia. No evidence of bowel obstruction or inflammation. Unremarkable appendix. Vascular/Lymphatic: Extensive atherosclerosis of the abdominal aorta with mild infrarenal aortic ectasia measuring up to 2.8 cm diameter. Central displacement of intimal calcification more distally in the aorta over a length of 2.5 cm suggests a short segment dissection. No enlarged lymph nodes. Reproductive: 9 mm calcification in the uterine fundus likely reflecting a small fibroid. Unremarkable ovaries. Other: No intraperitoneal free fluid. Small fat containing umbilical hernia. Musculoskeletal: Mild L4 and L5 superior endplate compression fractures, chronic in appearance. Mild spondylosis and moderate posterior element hypertrophy in the lower lumbar spine with likely mild spinal stenosis at L3-4 and L4-5. IMPRESSION: 1. 4 mm distal left ureteral calculus with mild hydroureteronephrosis. 2. Punctate nonobstructing bilateral renal calculi. 3. Small hiatal hernia. 4. Aortic Atherosclerosis (ICD10-I70.0). Mild infrarenal aortic ectasia and suspected short segment aortic dissection. Electronically Signed   By: Logan Bores M.D.   On: 10/11/2016 08:20     CBC  Recent Labs Lab 10/25/16 1441 10/26/16 0400 10/27/16 0614  WBC 17.6* 14.5* 21.6*  HGB 10.2* 10.7* 9.9*  HCT 30.4* 31.2* 28.8*  PLT 555* 576* 579*  MCV 85.2 85.2 84.7  MCH 28.5 29.1 29.2  MCHC 33.5 34.1 34.5  RDW 16.2* 16.0* 16.2*    Chemistries   Recent Labs Lab 10/25/16 1441 10/26/16 0400 10/27/16 0614 10/28/16 0425 10/29/16 0601  NA 127* 129* 129* 130* 129*  K 3.5 4.1 4.6 5.0 4.7  CL 90* 95* 92* 93* 93*  CO2 25 23 28 30 29   GLUCOSE 187* 237* 330* 306* 322*  BUN 13 15 21* 25* 29*  CREATININE 0.52 0.51 0.45 0.55 0.50  CALCIUM 8.8* 8.4* 8.3* 8.2* 8.5*  MG 1.6* 1.5*  --   --   --   AST 47*  47*  --   --   --   ALT 38 41  --   --   --   ALKPHOS 109 112  --   --   --   BILITOT 2.0* 1.4*  --   --   --     ------------------------------------------------------------------------------------------------------------------ estimated creatinine clearance is 58.8 mL/min (by C-G formula based on SCr of 0.5 mg/dL). ------------------------------------------------------------------------------------------------------------------ No results for input(s): HGBA1C in the last 72 hours. ------------------------------------------------------------------------------------------------------------------ No results for input(s): CHOL, HDL, LDLCALC, TRIG, CHOLHDL, LDLDIRECT in the last 72 hours. ------------------------------------------------------------------------------------------------------------------ No results for input(s): TSH, T4TOTAL, T3FREE, THYROIDAB in the last 72 hours.  Invalid input(s): FREET3 ------------------------------------------------------------------------------------------------------------------ No results for input(s): VITAMINB12, FOLATE, FERRITIN, TIBC, IRON, RETICCTPCT in the last 72 hours.  Coagulation profile No results for input(s): INR, PROTIME in the last 168 hours.  No results for input(s): DDIMER in the last 72 hours.  Cardiac Enzymes  Recent Labs Lab 10/28/16 0714 10/28/16 1248 10/28/16 1918  TROPONINI <0.03 <0.03 0.04*   ------------------------------------------------------------------------------------------------------------------ Invalid input(s): POCBNP    Assessment & Plan  Patient is a 66 year old with recent CPR  Acute hypoxic respiratory failure due to bilateral pneumonitis and COPD exacerbation -Continue IV steroids, Antibiotics - Scheduled Nebulizers - Inhalers, Patient is on room air.. Likely discharge tomorrow home with prednisone, antibiotics, nebulizer. --due to CT scan showing possible mucus plugging versus mass on the bronchus pulmonary saw the patient they feel she is too high risk to do a bronchoscopy, recommends continuing therapy  outpatient follow-up with repeat CT   *  atrial fibrillationl; rate controlled with by mouth Cardizem. No anticoagulation at this time .  * diabetes mellitus. Continue insulin pump as doing at home, bg elevated to steroids  * CAD with multivessel disease. Not a good candidate for CABG due to pulmonary issues according to recent cardiothoracic. Cardiology following Continue aspirin   *hyperlipidemia unspecified continue Lipitor  * DVT prophylaxis. SCDs  Hyponatremia with sodium 129 today unable to discharge because of multiple medical problems, recent cardiac arrest and CPR now with hyponatremia watch closely, and recheck sodium tomorrow and then possible discharge if sodium stays stable.     Code Status Orders        Start     Ordered   10/25/16 1649  Full code  Continuous     10/25/16 1651    Code Status History    Date Active Date Inactive Code Status Order ID Comments User Context   10/13/2016 11:29 AM 10/21/2016  6:46 PM Full Code [id number]  Rise Mu, PA-C Inpatient   10/11/2016 12:26 PM 10/13/2016 10:48 AM Full Code [id number]  Nelva Bush, MD Inpatient   10/11/2016 11:15 AM 10/11/2016 12:26 PM Full Code [id number]  Wilhelmina Mcardle, MD Inpatient           Consultscardiology  DVT Prophylaxis  SCDs  Lab Results  Component Value Date   PLT 579 (H) 10/27/2016     Time Spent in minutes   1min  Greater than 50% of time spent in care coordination and counseling patient regarding the condition and plan of care.   Epifanio Lesches M.D on 10/29/2016 at 11:35 AM  Between 7am to 6pm - Pager - [phone]  After 6pm go to [email or web address] - password [email or web address]   Office  [phone]

## 2016-10-30 LAB — GLUCOSE, CAPILLARY
GLUCOSE-CAPILLARY: 269 mg/dL — AB (ref 65–99)
GLUCOSE-CAPILLARY: 183 mg/dL — AB (ref 65–99)
Glucose-Capillary: 282 mg/dL — ABNORMAL HIGH (ref 65–99)
Glucose-Capillary: 241 mg/dL — ABNORMAL HIGH (ref 65–99)

## 2016-10-30 LAB — BASIC METABOLIC PANEL
ANION GAP: 8 (ref 5–15)
BUN: 25 mg/dL — ABNORMAL HIGH (ref 6–20)
CALCIUM: 8.6 mg/dL — AB (ref 8.9–10.3)
CO2: 28 mmol/L (ref 22–32)
CREATININE: 0.7 mg/dL (ref 0.44–1.00)
Chloride: 91 mmol/L — ABNORMAL LOW (ref 101–111)
Glucose, Bld: 258 mg/dL — ABNORMAL HIGH (ref 65–99)
Potassium: 3.8 mmol/L (ref 3.5–5.1)
Sodium: 127 mmol/L — ABNORMAL LOW (ref 135–145)

## 2016-10-30 LAB — CULTURE, BLOOD (ROUTINE X 2)
Culture: NO GROWTH
Culture: NO GROWTH
SPECIAL REQUESTS: ADEQUATE
SPECIAL REQUESTS: ADEQUATE

## 2016-10-30 MED ORDER — LEVOFLOXACIN 750 MG PO TABS: 750.0000 mg | ORAL_TABLET | Freq: Every day | ORAL | 0 refills | Status: DC

## 2016-10-30 MED ORDER — DILTIAZEM HCL 30 MG PO TABS: 30.0000 mg | ORAL_TABLET | Freq: Four times a day (QID) | ORAL | 0 refills | Status: DC

## 2016-10-30 MED ORDER — PREDNISONE 10 MG (21) PO TBPK: ORAL_TABLET | ORAL | 0 refills | Status: DC

## 2016-10-30 MED ORDER — GLUCERNA SHAKE PO LIQD: 237.0000 mL | Freq: Three times a day (TID) | ORAL | 0 refills | Status: DC

## 2016-10-30 MED ORDER — FUROSEMIDE 20 MG PO TABS: 20.0000 mg | ORAL_TABLET | Freq: Every day | ORAL | 0 refills | Status: DC

## 2016-10-30 NOTE — Progress Notes (Addendum)
[place] at [facility]                                                                                                                                                                                  Patient Demographics   Tamantha Saline, is a 66 y.o. female, DOB - 03-27-1950, DGU:[id number]  Admit date - 10/25/2016   Admitting Physician Hillary Bow, MD  Outpatient Primary MD for the patient is Morayati, Lourdes Sledge, MD   LOS - 5  Subjective: but she denies shortness of breath. Stable for discharge.  Review of Systems:   CONSTITUTIONAL: No documented fever. No fatigue, weakness. No weight gain, no weight loss.  EYES: No blurry or double vision.  ENT: No tinnitus. No postnasal drip. No redness of the oropharynx.  RESPIRATORY: positive cough, no wheeze, no hemoptysis.positive dyspnea.  CARDIOVASCULAR: No chest pain. No orthopnea. No palpitations. No syncope.  GASTROINTESTINAL: No nausea, no vomiting or diarrhea. No abdominal pain. No melena or hematochezia.  GENITOURINARY: No dysuria or hematuria.  ENDOCRINE: No polyuria or nocturia. No heat or cold intolerance.  HEMATOLOGY: No anemia. No bruising. No bleeding.  INTEGUMENTARY: No rashes. No lesions.  MUSCULOSKELETAL: No arthritis. No swelling. No gout.  NEUROLOGIC: No numbness, tingling, or ataxia. No seizure-type activity.  PSYCHIATRIC: No anxiety. No insomnia. No ADD.    Vitals:   Vitals:   10/29/16 1947 10/30/16 0438 10/30/16 0900 10/30/16 1136  BP: 133/70 (!) 146/82 124/73 139/89  Pulse: 91 91 (!) 103 90  Resp: 18 16 16 18   Temp: 98.3 F (36.8 C) 98.4 F (36.9 C) 97.8 F (36.6 C)   TempSrc: Oral Oral Oral   SpO2: 96% 96% 97% 96%  Weight:  56.9 kg (125 lb 6.4 oz)    Height:        Wt Readings from Last 3 Encounters:  10/30/16 56.9 kg (125 lb 6.4 oz)  10/21/16 62.5 kg (137 lb 11.2 oz)  10/11/16 67.6 kg (149 lb 0.5 oz)     Intake/Output Summary (Last 24 hours) at 10/30/16 1226 Last data  filed at 10/30/16 0900  Gross per 24 hour  Intake              480 ml  Output                0 ml  Net              480 ml    Physical Exam:   GENERAL: Pleasant-appearing in no apparent distress.  HEAD, EYES, EARS, NOSE AND THROAT: Atraumatic, normocephalic. Extraocular muscles are intact. Pupils equal and reactive to light. Sclerae anicteric. No conjunctival injection. No oro-pharyngeal  erythema.  NECK: Supple. There is no jugular venous distention. No bruits, no lymphadenopathy, no thyromegaly.  HEART: irregularly irregular. No murmurs, no rubs, no clicks.  LUNGS: Bilaterally clear to auscultation. No wheezing, rales. ABDOMEN: Soft, flat, nontender, nondistended. Has good bowel sounds. No hepatosplenomegaly appreciated.  EXTREMITIES: No evidence of any cyanosis, clubbing, or peripheral edema.  +2 pedal and radial pulses bilaterally.  NEUROLOGIC: The patient is alert, awake, and oriented x3 with no focal motor or sensory deficits appreciated bilaterally.  SKIN: Moist and warm with no rashes appreciated.  Psych: Not anxious, depressed LN: No inguinal LN enlargement    Antibiotics   Anti-infectives    Start     Dose/Rate Route Frequency Ordered Stop   10/30/16 0000  levofloxacin (LEVAQUIN) 750 MG tablet     750 mg Oral Daily 10/30/16 1217     10/28/16 1700  levofloxacin (LEVAQUIN) tablet 750 mg     750 mg Oral Daily 10/28/16 0901     10/25/16 1700  levofloxacin (LEVAQUIN) IVPB 750 mg  Status:  Discontinued     750 mg 100 mL/hr over 90 Minutes Intravenous Every 24 hours 10/25/16 1646 10/28/16 0901      Medications   Scheduled Meds: . ALPRAZolam  0.25 mg Oral Daily  . aspirin EC  81 mg Oral Daily  . atorvastatin  10 mg Oral q1800  . diltiazem  30 mg Oral Q6H  . ezetimibe  10 mg Oral Daily  . feeding supplement (GLUCERNA SHAKE)  237 mL Oral TID BM  . guaiFENesin  600 mg Oral BID  . insulin pump   Subcutaneous TID AC, HS, 0200  . levofloxacin  750 mg Oral Daily  .  multivitamin with minerals  1 tablet Oral Daily  . [START ON 10/31/2016] predniSONE  30 mg Oral Q breakfast   Followed by  . [START ON 11/01/2016] predniSONE  20 mg Oral Q breakfast   Followed by  . [START ON 11/02/2016] predniSONE  10 mg Oral Q breakfast  . sodium chloride flush  3 mL Intravenous Q12H   Continuous Infusions:  PRN Meds:.acetaminophen **OR** acetaminophen, ipratropium, ondansetron **OR** ondansetron (ZOFRAN) IV, oxyCODONE, polyethylene glycol   Data Review:   Micro Results Recent Results (from the past 240 hour(s))  Blood Culture (routine x 2)     Status: None   Collection Time: 10/25/16  4:34 PM  Result Value Ref Range Status   Specimen Description BLOOD LEFT ANTECUBITAL  Final   Special Requests   Final    BOTTLES DRAWN AEROBIC AND ANAEROBIC Blood Culture adequate volume   Culture NO GROWTH 5 DAYS  Final   Report Status 10/30/2016 FINAL  Final  Blood Culture (routine x 2)     Status: None   Collection Time: 10/25/16  4:34 PM  Result Value Ref Range Status   Specimen Description BLOOD RIGHT ANTECUBITAL  Final   Special Requests   Final    BOTTLES DRAWN AEROBIC AND ANAEROBIC Blood Culture adequate volume   Culture NO GROWTH 5 DAYS  Final   Report Status 10/30/2016 FINAL  Final    Radiology Reports Dg Chest 1 View  Result Date: 10/11/2016 CLINICAL DATA:  ET and NG tube placement. EXAM: CHEST 1 VIEW COMPARISON:  Chest x-ray from same day. FINDINGS: Unchanged positioning of the endotracheal tube with the tip approximately 3.9 cm above the level of the carina. New enteric tube in place with the tip and distal side port in the gastric body. The cardiomediastinal silhouette is  normal in size. No focal consolidation, pleural effusion, or pneumothorax. No acute osseous abnormality. IMPRESSION: 1. Interval placement of an enteric tube with the tip and distal side port in the gastric body. 2.  No active cardiopulmonary disease. Electronically Signed   By: Titus Dubin M.D.    On: 10/11/2016 13:32   Dg Chest 1 View  Result Date: 10/11/2016 CLINICAL DATA:  Post intubation, former smoker, diabetes mellitus EXAM: CHEST 1 VIEW COMPARISON:  Portable exam 1039 hours without priors for comparison FINDINGS: Tip of endotracheal tube projects 4.8 cm above carina. External pacing leads project over chest. Normal heart size, mediastinal contours, and pulmonary vascularity. Lungs clear. No pleural effusion or pneumothorax. Bones demineralized. IMPRESSION: No acute abnormalities. Electronically Signed   By: Lavonia Dana M.D.   On: 10/11/2016 11:52   Ct Angio Chest Pe W Or Wo Contrast  Result Date: 10/25/2016 CLINICAL DATA:  Pt arrived POV for reports of SOB and cough. Pt was seen here recently and had anaphylactic reaction to Rocephin. Pt room air SpO2 84%, pt placed on 3L O2 via [place] and SpO2 increased to 92%. Pt reports cough. Bilateral wheezes heard at lung bases EXAM: CT ANGIOGRAPHY CHEST WITH CONTRAST TECHNIQUE: Multidetector CT imaging of the chest was performed using the standard protocol during bolus administration of intravenous contrast. Multiplanar CT image reconstructions and MIPs were obtained to evaluate the vascular anatomy. CONTRAST:  75 cc Isovue 370 COMPARISON:  Chest x-ray 10/25/2016 FINDINGS: Cardiovascular: Pulmonary arteries are well opacified. There is no acute pulmonary embolus. There is extensive 3 vessel coronary artery calcification. Pericardial effusion is present, measuring approximately 11 mm in width. Pericardial effusion appears higher attenuation than simple water density and may contain proteinaceous material, debris, purulent, or bloody fluid. There is extensive atherosclerotic calcification of the thoracic aorta not associated with aneurysm. Mediastinum/Nodes: The visualized portion of the thyroid gland has a normal appearance. Esophagus is normal. Small hiatal hernia. No significant mediastinal, hilar, or axillary adenopathy. Lungs/Pleura: There are numerous  ground-glass opacities throughout the lungs bilaterally, primarily involving the upper lobes but also involving the left lower lobe. There is relative sparing of this opacity in the right lower lobe. There is atelectasis and right lower lobe obstruction by a mucus plug or soft tissue mass. There is bibasilar atelectasis. No significant pleural effusion. Upper Abdomen: Unremarkable. Musculoskeletal: There are Schmorl's nodes at numerous thoracic vertebral levels. Numerous bilateral rib fractures are identified, most of which are probably chronic. Old rib fractures are identified involving at least ribs 2 to through 7. Old right rib fractures are identified, involving at least ribs 2 through 6. Right fifth rib fracture appears acute. Correlation with history of trauma is recommended. Review of the MIP images confirms the above findings. IMPRESSION: 1. Technically adequate exam showing no acute pulmonary embolus. 2. Diffuse parenchymal alveolar infiltrates compatible with infectious or inflammatory process. 3. Right lower lobe bronchus is obstructed by a mucus plug or soft tissue mass. There is right lower lobe atelectasis and relative sparing of airspace filling opacities in the right lower lobe. 4. Cardiomegaly, extensive coronary artery disease, and high attenuation pericardial effusion. Considerations include hemopericardium, pericarditis, or other high attenuation material such as proteinaceous fluid. 5. Small hiatal hernia. 6. Multiple bilateral rib fractures. Right fifth rib fracture appears acute. Correlation with history of trauma is indicated. Electronically Signed   By: Nolon Nations M.D.   On: 10/25/2016 16:21   US Renal  Result Date: 10/14/2016 CLINICAL DATA:  Kidney stone.  History of  cardiac arrest. EXAM: RENAL / URINARY TRACT ULTRASOUND COMPLETE COMPARISON:  Abdominal CT 10/11/2016 FINDINGS: Right Kidney: Length: 11.5 cm. 2.2 cm hilar cyst. No hydronephrosis. Borderline hyperechoic renal cortex.  Left Kidney: Length: 12.5 cm. Mild hydronephrosis, improved from CT 3 days ago. No evidence of mass. Bladder: Decompressed by Foley catheter. IMPRESSION: Mild left hydronephrosis, improved from CT 3 days ago. Electronically Signed   By: Monte Fantasia M.D.   On: 10/14/2016 09:05   Ct Femur Right Wo Contrast  Result Date: 10/16/2016 CLINICAL DATA:  Right thigh swelling. Raising vascular catheterization. EXAM: CT OF THE LOWER RIGHT EXTREMITY WITHOUT CONTRAST TECHNIQUE: Multidetector CT imaging of the right lower extremity was performed according to the standard protocol. COMPARISON:  CT pelvis 10/11/2016 FINDINGS: Bones/Joint/Cartilage Degenerative arthropathy of both hips. Small bilateral knee joint effusions. Ligaments Suboptimally assessed by CT. Muscles and Tendons There is no focal hematoma around the common femoral vasculature, but there is considerable high density expansion of the right hip adductor musculature, most notably the pectineus, adductor brevis, and adductor magnus muscles. Comparing to the contralateral side, the volume of adductor hematoma on the right is estimated at 420 cubic cm (I obtain this by subtracting the volume of the normal left-sided muscles from the volume of the expanded right-sided adductor musculature). There are some lower density regions within the musculature likely representing mixed density hematoma. Infection and abscess is considered less likely. Soft tissues Subcutaneous edema noted in the right thigh. Edema tracks along deep fascia planes of the medial compartment of the right thigh. Does Foley catheter in the urinary bladder. IMPRESSION: 1. Expansion and high density in the right hip adductor musculature as detailed above, volume of suspected hematoma at 420 cubic cm. There is edema in surrounding fascia planes and in the subcutaneous tissues of the right thigh. 2. No hematoma is observed along the common femoral vasculature directly. This raises suspicion that the  right adductor hematoma is spontaneous. 3. Degenerative arthropathy of both hips. 4. Small bilateral knee joint effusions. Electronically Signed   By: Van Clines M.D.   On: 10/16/2016 14:03   Dg Chest Port 1 View  Result Date: 10/28/2016 CLINICAL DATA:  Shortness of breath. EXAM: PORTABLE CHEST 1 VIEW COMPARISON:  CT 10/25/2016.  Chest x-ray 10/25/2016 . FINDINGS: Mediastinum and hilar structures normal. Stable cardiomegaly. Multifocal bilateral pulmonary infiltrates, improved from prior exam. Small bilateral pleural effusions. No pneumothorax. IMPRESSION: 1. Multifocal bilateral pulmonary infiltrates, slightly improved from prior CT and chest x-ray of 10/25/2016. Continued follow-up exams can be obtained. Small bilateral pleural effusions. 2. Stable cardiomegaly . Electronically Signed   By: Marcello Moores  Register   On: 10/28/2016 08:24   Dg Chest Port 1 View  Result Date: 10/25/2016 CLINICAL DATA:  reports of SOB and cough. Pt was seen here recently and had anaphylactic reaction to rocephin last week, history of CAD, NSTEMI, diabetes, ventricular fibrillation EXAM: PORTABLE CHEST 1 VIEW COMPARISON:  10/19/2016 FINDINGS: There is elevation of the RIGHT hemidiaphragm not changed from 10/19/2016 but new from 10/11/2016. Normal cardiac silhouette. Lungs relatively clear. Mild RIGHT basilar atelectasis. No acute osseous abnormality. IMPRESSION: Elevation of the RIGHT hemidiaphragm is new from 10/11/2016 with differential including subpulmonic effusion versus potential diaphragmatic paralysis. Electronically Signed   By: Suzy Bouchard M.D.   On: 10/25/2016 15:11   Dg Chest Port 1 View  Result Date: 10/19/2016 CLINICAL DATA:  Acute respiratory failure, hypoxia EXAM: PORTABLE CHEST 1 VIEW COMPARISON:  10/18/2016 FINDINGS: Right base atelectasis with mildly elevated right hemidiaphragm. Cardiomegaly.  Left lung is clear. No effusions or acute bony abnormality. IMPRESSION: Right base atelectasis.  Mild  cardiomegaly. Electronically Signed   By: Rolm Baptise M.D.   On: 10/19/2016 08:42   Dg Chest Port 1 View  Result Date: 10/18/2016 CLINICAL DATA:  Acute respiratory failure EXAM: PORTABLE CHEST 1 VIEW COMPARISON:  10/17/2016 FINDINGS: Cardiac shadow is mildly enlarged but stable. Aortic calcifications are again seen. Lungs are well-aerated with right basilar atelectatic changes. Some mild interstitial changes are seen particularly on the left but stable. No new focal abnormality is seen. IMPRESSION: No significant interval change from the prior exam. Electronically Signed   By: Inez Catalina M.D.   On: 10/18/2016 07:56   Dg Chest Port 1 View  Result Date: 10/17/2016 CLINICAL DATA:  Cough, acute respiratory failure, hypoxia EXAM: PORTABLE CHEST 1 VIEW COMPARISON:  10/16/2016 FINDINGS: Cardiomegaly. Diffuse left lung airspace disease, most confluent in the left upper lobe. Right basilar atelectasis or infiltrate. Findings are similar to prior study. Suspect small right effusion. IMPRESSION: Stable bilateral airspace opacities, left greater than right. Suspect small effusion on the right. No real change. Electronically Signed   By: Rolm Baptise M.D.   On: 10/17/2016 07:46   Dg Chest Port 1 View  Result Date: 10/16/2016 CLINICAL DATA:  Shortness of breath. EXAM: PORTABLE CHEST 1 VIEW COMPARISON:  October 15, 2016 FINDINGS: Left greater than right pulmonary opacities persist, improved in the interval. No other interval changes. IMPRESSION: Improving but persistent left greater than right pulmonary opacities. Electronically Signed   By: Dorise Bullion III M.D   On: 10/16/2016 08:40   Dg Chest Port 1 View  Result Date: 10/15/2016 CLINICAL DATA:  Followup acute respiratory failure. EXAM: PORTABLE CHEST 1 VIEW COMPARISON:  10/14/2016 FINDINGS: Widespread bilateral pneumonia persists, with slight radiographic improvement. Small amount of a fusion and focal volume loss in the right lower lobe. No worsening or  new findings. IMPRESSION: Radiographic improvement of diffuse pneumonia. Some persistent volume loss at the right base. Electronically Signed   By: Nelson Chimes M.D.   On: 10/15/2016 07:49   Dg Chest Port 1 View  Result Date: 10/14/2016 CLINICAL DATA:  66 y/o  F; respiratory distress. EXAM: PORTABLE CHEST 1 VIEW COMPARISON:  10/13/2016 chest radiograph FINDINGS: Asymmetric consolidations of the lungs are stable from prior chest radiographs. Small to moderate right pleural effusion. Stable cardiac silhouette within normal limits. Aortic atherosclerosis with calcification. No acute osseous abnormality. IMPRESSION: Stable asymmetric consolidations throughout the lungs and small to moderate right effusion. Differential includes multifocal pneumonia and pulmonary edema. Electronically Signed   By: Kristine Garbe M.D.   On: 10/14/2016 01:30   Dg Chest Port 1 View  Result Date: 10/13/2016 CLINICAL DATA:  Shortness of breath today EXAM: PORTABLE CHEST 1 VIEW COMPARISON:  Earlier today FINDINGS: Continued progression of bilateral airspace disease, asymmetric to the left. Normal heart size for technique. Negative mediastinal contours. Small if any pleural effusions. No pneumothorax. IMPRESSION: Progression of asymmetric bilateral airspace disease favoring pneumonia or noncardiogenic edema (was there aspiration at time of cardiac arrest?). Electronically Signed   By: Monte Fantasia M.D.   On: 10/13/2016 13:44   Dg Chest Port 1 View  Result Date: 10/13/2016 CLINICAL DATA:  Respiratory failure. EXAM: PORTABLE CHEST 1 VIEW COMPARISON:  10/11/2016. FINDINGS: Interim removal of endotracheal tube and NG tube. Heart size normal. New onset of diffuse bilateral pulmonary interstitial prominence noted. Findings consistent with a process such as interstitial pneumonitis or interstitial edema. Small right  pleural effusion. No pneumothorax . IMPRESSION: 1.  Interim removal of endotracheal tube and NG tube. 2. New  onset of diffuse bilateral pulmonary interstitial prominence consistent with a process such as interstitial pneumonitis or interstitial edema. Small right pleural effusion. Electronically Signed   By: Marcello Moores  Register   On: 10/13/2016 06:19   Dg Abd Portable 1v  Result Date: 10/14/2016 CLINICAL DATA:  Ureteral calculus, abdominal pain EXAM: PORTABLE ABDOMEN - 1 VIEW COMPARISON:  CT abdomen and pelvis 10/11/2016 FINDINGS: LEFT pelvic phleboliths again identified. Distal LEFT ureteral calculus seen on prior CT exam is not visualized on current study. RIGHT femoral line noted. Bowel gas pattern normal. Diffuse osseous demineralization. IMPRESSION: Previously identified distal LEFT ureteral calculus is no longer seen. Electronically Signed   By: Lavonia Dana M.D.   On: 10/14/2016 07:40   Ct Renal Stone Study  Result Date: 10/11/2016 CLINICAL DATA:  Left flank pain.  History of renal stones. EXAM: CT ABDOMEN AND PELVIS WITHOUT CONTRAST TECHNIQUE: Multidetector CT imaging of the abdomen and pelvis was performed following the standard protocol without IV contrast. COMPARISON:  None. FINDINGS: Lower chest: Minimal atelectasis in the lung bases. No pleural effusion. Three-vessel coronary artery atherosclerosis. Normal heart size. No pericardial effusion. Hepatobiliary: No focal liver abnormality is seen. No gallstones, gallbladder wall thickening, or biliary dilatation. Pancreas: Mildly truncated appearance of the pancreatic tail. No ductal dilatation or surrounding inflammatory changes. Spleen: Unremarkable. Adrenals/Urinary Tract: Unremarkable adrenal glands. Punctate nonobstructing calculus in the lower pole of the right kidney. Three punctate nonobstructing left renal calculi. 4 mm obstructing calculus in the distal left ureter near the UVJ resulting in mild hydroureteronephrosis. Mild left perinephric stranding. Unremarkable bladder. Stomach/Bowel: Small sliding hiatal hernia. No evidence of bowel obstruction or  inflammation. Unremarkable appendix. Vascular/Lymphatic: Extensive atherosclerosis of the abdominal aorta with mild infrarenal aortic ectasia measuring up to 2.8 cm diameter. Central displacement of intimal calcification more distally in the aorta over a length of 2.5 cm suggests a short segment dissection. No enlarged lymph nodes. Reproductive: 9 mm calcification in the uterine fundus likely reflecting a small fibroid. Unremarkable ovaries. Other: No intraperitoneal free fluid. Small fat containing umbilical hernia. Musculoskeletal: Mild L4 and L5 superior endplate compression fractures, chronic in appearance. Mild spondylosis and moderate posterior element hypertrophy in the lower lumbar spine with likely mild spinal stenosis at L3-4 and L4-5. IMPRESSION: 1. 4 mm distal left ureteral calculus with mild hydroureteronephrosis. 2. Punctate nonobstructing bilateral renal calculi. 3. Small hiatal hernia. 4. Aortic Atherosclerosis (ICD10-I70.0). Mild infrarenal aortic ectasia and suspected short segment aortic dissection. Electronically Signed   By: Logan Bores M.D.   On: 10/11/2016 08:20     CBC  Recent Labs Lab 10/25/16 1441 10/26/16 0400 10/27/16 0614  WBC 17.6* 14.5* 21.6*  HGB 10.2* 10.7* 9.9*  HCT 30.4* 31.2* 28.8*  PLT 555* 576* 579*  MCV 85.2 85.2 84.7  MCH 28.5 29.1 29.2  MCHC 33.5 34.1 34.5  RDW 16.2* 16.0* 16.2*    Chemistries   Recent Labs Lab 10/25/16 1441 10/26/16 0400 10/27/16 0614 10/28/16 0425 10/29/16 0601 10/30/16 1057  NA 127* 129* 129* 130* 129* 127*  K 3.5 4.1 4.6 5.0 4.7 3.8  CL 90* 95* 92* 93* 93* 91*  CO2 25 23 28 30 29 28   GLUCOSE 187* 237* 330* 306* 322* 258*  BUN 13 15 21* 25* 29* 25*  CREATININE 0.52 0.51 0.45 0.55 0.50 0.70  CALCIUM 8.8* 8.4* 8.3* 8.2* 8.5* 8.6*  MG 1.6* 1.5*  --   --   --   --  AST 47* 47*  --   --   --   --   ALT 38 41  --   --   --   --   ALKPHOS 109 112  --   --   --   --   BILITOT 2.0* 1.4*  --   --   --   --     ------------------------------------------------------------------------------------------------------------------ estimated creatinine clearance is 58.8 mL/min (by C-G formula based on SCr of 0.7 mg/dL). ------------------------------------------------------------------------------------------------------------------ No results for input(s): HGBA1C in the last 72 hours. ------------------------------------------------------------------------------------------------------------------ No results for input(s): CHOL, HDL, LDLCALC, TRIG, CHOLHDL, LDLDIRECT in the last 72 hours. ------------------------------------------------------------------------------------------------------------------ No results for input(s): TSH, T4TOTAL, T3FREE, THYROIDAB in the last 72 hours.  Invalid input(s): FREET3 ------------------------------------------------------------------------------------------------------------------ No results for input(s): VITAMINB12, FOLATE, FERRITIN, TIBC, IRON, RETICCTPCT in the last 72 hours.  Coagulation profile No results for input(s): INR, PROTIME in the last 168 hours.  No results for input(s): DDIMER in the last 72 hours.  Cardiac Enzymes  Recent Labs Lab 10/28/16 0714 10/28/16 1248 10/28/16 1918  TROPONINI <0.03 <0.03 0.04*   ------------------------------------------------------------------------------------------------------------------ Invalid input(s): POCBNP    Assessment & Plan  Patient is a 66 year old with recent CPR  Acute hypoxic respiratory failure due to bilateral pneumonitis and COPD exacerbation -Continue IV steroids, Antibiotics - Scheduled Nebulizers - Inhalers, Patient is on room air.. Likely discharge tomorrow home with prednisone, antibiotics, nebulizer. --due to CT scan showing possible mucus plugging versus mass on the bronchus pulmonary saw the patient they feel she is too high risk to do a bronchoscopy, recommends continuing therapy  outpatient follow-up with repeat CT   *  atrial fibrillationl; rate controlled with by mouth Cardizem. No anticoagulation at this time .  * diabetes mellitus. Continue insulin pump as doing at home, bg elevated to steroids  * CAD with multivessel disease. Not a good candidate for CABG due to pulmonary issues according to recent cardiothoracic. Cardiology following Continue aspirin   *hyperlipidemia unspecified continue Lipitor  * DVT prophylaxis. SCDs  hyponatremia ''sodium is still 127. No symptoms of shortness of breath. Advised patientto follow-up with cardiology, primary doctor regarding repeat sodium levels in 2-3 days. Discharged to her home with small dose of Lasix.     Code Status Orders        Start     Ordered   10/25/16 1649  Full code  Continuous     10/25/16 1651    Code Status History    Date Active Date Inactive Code Status Order ID Comments User Context   10/13/2016 11:29 AM 10/21/2016  6:46 PM Full Code [id number]  Rise Mu, PA-C Inpatient   10/11/2016 12:26 PM 10/13/2016 10:48 AM Full Code [id number]  Nelva Bush, MD Inpatient   10/11/2016 11:15 AM 10/11/2016 12:26 PM Full Code [id number]  Wilhelmina Mcardle, MD Inpatient        Discharge home today.   Consultscardiology  DVT Prophylaxis  SCDs  Lab Results  Component Value Date   PLT 579 (H) 10/27/2016     Time Spent in minutes   61min  Greater than 50% of time spent in care coordination and counseling patient regarding the condition and plan of care.   Epifanio Lesches M.D on 10/30/2016 at 12:26 PM  Between 7am to 6pm - Pager - [phone]  After 6pm go to [email or web address] - password [email or web address] [facility]   Office  [phone]

## 2016-10-30 NOTE — Care Management Note (Addendum)
Case Management Note  Patient Details  Name: Lisa [name] MRN: [id number] Date of Birth: 13-Mar-1950  Subjective/Objective:   No IV ABX. No new 02 orders per [name] is lying, sitting and ambulating with out a significant drop in her 02 SATS today per her nurse Manuela Schwartz. No other needs identified. Husband will transport home today.  Per RNCM note in chart, [facility] is continuing to locate a home health provider to provide HH=PT, RN, OT, Aide.  Call to [facility] per discharge home today.                Action/Plan:   Expected Discharge Date:  10/30/16               Expected Discharge Plan:  Home/Self Care  In-House Referral:  NA  Discharge planning Services  NA  Post Acute Care Choice:    Choice offered to:  NA  DME Arranged:  N/A DME Agency:  NA  HH Arranged:  NA HH Agency:  NA  Status of Service:  Completed, signed off  If discussed at Many of Stay Meetings, dates discussed:    Additional Comments:  Navid Lenzen A, RN 10/30/2016, 1:27 PM

## 2016-10-30 NOTE — Progress Notes (Signed)
02 sats 98%  On room air. sats 95% with walking. Pt to be discharged this afternoon. Iv's and tele removed. disch instructions given to pt and husband. disch to home accompanied by spouse

## 2016-10-31 ENCOUNTER — Ambulatory Visit: Payer: Self-pay | Admitting: Physician Assistant

## 2016-10-31 NOTE — Telephone Encounter (Signed)
Spoke with patient and scheduled CT Chest at [facility] for Thurs. Oct 18 at 1:45 arrival time. No prep. Pt has been contacted and is aware of both appointments. Also mailed out appointments to patient. Nothing else needed at this time. Rhonda J Cobb

## 2016-11-02 ENCOUNTER — Encounter: Payer: Self-pay | Admitting: Nurse Practitioner

## 2016-11-02 ENCOUNTER — Ambulatory Visit (INDEPENDENT_AMBULATORY_CARE_PROVIDER_SITE_OTHER): Payer: Managed Care, Other (non HMO) | Admitting: Nurse Practitioner

## 2016-11-02 VITALS — BP 118/62 | HR 126 | Ht 63.0 in | Wt 123.2 lb

## 2016-11-02 DIAGNOSIS — E785 Hyperlipidemia, unspecified: Secondary | ICD-10-CM | POA: Diagnosis not present

## 2016-11-02 DIAGNOSIS — I313 Pericardial effusion (noninflammatory): Secondary | ICD-10-CM

## 2016-11-02 DIAGNOSIS — I3139 Other pericardial effusion (noninflammatory): Secondary | ICD-10-CM

## 2016-11-02 DIAGNOSIS — I480 Paroxysmal atrial fibrillation: Secondary | ICD-10-CM

## 2016-11-02 DIAGNOSIS — E1059 Type 1 diabetes mellitus with other circulatory complications: Secondary | ICD-10-CM | POA: Diagnosis not present

## 2016-11-02 DIAGNOSIS — I2510 Atherosclerotic heart disease of native coronary artery without angina pectoris: Secondary | ICD-10-CM

## 2016-11-02 DIAGNOSIS — D62 Acute posthemorrhagic anemia: Secondary | ICD-10-CM

## 2016-11-02 MED ORDER — DILTIAZEM HCL ER COATED BEADS 180 MG PO CP24: 180.0000 mg | ORAL_CAPSULE | Freq: Every day | ORAL | 3 refills | Status: DC

## 2016-11-02 MED ORDER — DILTIAZEM HCL 30 MG PO TABS: ORAL_TABLET | ORAL | 5 refills | Status: DC

## 2016-11-02 NOTE — Patient Instructions (Addendum)
Medication Instructions:  Your physician has recommended you make the following change in your medication:  CHANGE diltiazem 30mg  to every 6 hours as needed for heart rate greater than 100. START taking diltiazem 180mg  once daily   Labwork: none  Testing/Procedures: none  Follow-Up: Your physician recommends that you schedule a follow-up appointment in: 3 weeks with Dr. Saunders Revel.    Any Other Special Instructions Will Be Listed Below (If Applicable).     If you need a refill on your cardiac medications before your next appointment, please call your pharmacy.  Diltiazem extended-release capsules or tablets What is this medicine? DILTIAZEM (dil TYE a zem) is a calcium-channel blocker. It affects the amount of calcium found in your heart and muscle cells. This relaxes your blood vessels, which can reduce the amount of work the heart has to do. This medicine is used to treat high blood pressure and chest pain caused by angina. This medicine may be used for other purposes; ask your health care provider or pharmacist if you have questions. COMMON BRAND NAME(S): Cardizem CD, Cardizem LA, Cardizem SR, Cartia XT, Dilacor XR, Dilt-CD, Diltia XT, Diltzac, Matzim LA, Rema Fendt, Tiamate, Tiazac What should I tell my health care provider before I take this medicine? They need to know if you have any of these conditions: -heart problems, low blood pressure, irregular heartbeat -liver disease -previous heart attack -an unusual or allergic reaction to diltiazem, other medicines, foods, dyes, or preservatives -pregnant or trying to get pregnant -breast-feeding How should I use this medicine? Take this medicine by mouth with a glass of water. Follow the directions on the prescription label. Swallow whole, do not crush or chew. Ask your doctor or pharmacist if your should take this medicine with food. Take your doses at regular intervals. Do not take your medicine more often then directed. Do not stop  taking except on the advice of your doctor or health care professional. Ask your doctor or health care professional how to gradually reduce the dose. Talk to your pediatrician regarding the use of this medicine in children. Special care may be needed. Overdosage: If you think you have taken too much of this medicine contact a poison control center or emergency room at once. NOTE: This medicine is only for you. Do not share this medicine with others. What if I miss a dose? If you miss a dose, take it as soon as you can. If it is almost time for your next dose, take only that dose. Do not take double or extra doses. What may interact with this medicine? Do not take this medicine with any of the following medications: -cisapride -hawthorn -pimozide -ranolazine -red yeast rice This medicine may also interact with the following medications: -buspirone -carbamazepine -cimetidine -cyclosporine -digoxin -local anesthetics or general anesthetics -lovastatin -medicines for anxiety or difficulty sleeping like midazolam and triazolam -medicines for high blood pressure or heart problems -quinidine -rifampin, rifabutin, or rifapentine This list may not describe all possible interactions. Give your health care provider a list of all the medicines, herbs, non-prescription drugs, or dietary supplements you use. Also tell them if you smoke, drink alcohol, or use illegal drugs. Some items may interact with your medicine. What should I watch for while using this medicine? Check your blood pressure and pulse rate regularly. Ask your doctor or health care professional what your blood pressure and pulse rate should be and when you should contact him or her. You may feel dizzy or lightheaded. Do not drive, use machinery,  or do anything that needs mental alertness until you know how this medicine affects you. To reduce the risk of dizzy or fainting spells, do not sit or stand up quickly, especially if you are an  older patient. Alcohol can make you more dizzy or increase flushing and rapid heartbeats. Avoid alcoholic drinks. What side effects may I notice from receiving this medicine? Side effects that you should report to your doctor or health care professional as soon as possible: -allergic reactions like skin rash, itching or hives, swelling of the face, lips, or tongue -confusion, mental depression -feeling faint or lightheaded, falls -redness, blistering, peeling or loosening of the skin, including inside the mouth -slow, irregular heartbeat -swelling of the feet and ankles -unusual bleeding or bruising, pinpoint red spots on the skin Side effects that usually do not require medical attention (report to your doctor or health care professional if they continue or are bothersome): -constipation or diarrhea -difficulty sleeping -facial flushing -headache -nausea, vomiting -sexual dysfunction -weak or tired This list may not describe all possible side effects. Call your doctor for medical advice about side effects. You may report side effects to FDA at 1-800-FDA-1088. Where should I keep my medicine? Keep out of the reach of children. Store at room temperature between 15 and 30 degrees C (59 and 86 degrees F). Protect from humidity. Throw away any unused medicine after the expiration date. NOTE: This sheet is a summary. It may not cover all possible information. If you have questions about this medicine, talk to your doctor, pharmacist, or health care provider.  2018 Elsevier/Gold Standard (2007-05-10 14:35:47)

## 2016-11-02 NOTE — Progress Notes (Signed)
Office Visit    Patient Name: Lisa [name] Date of Encounter: 11/02/2016  Primary Care Provider:  Lenard Simmer, MD Primary Cardiologist:  Andree Coss, MD   Chief Complaint    Lisa [name] is a 66 y.o. female with a history of CAD s/p VF arrest in the setting of anaphylaxis (Rocephin for UTI), DM I, PVD/AAA, recent spontaneous groin hematoma w/ anemia req PRBCs, RLL bronchus obstruction (? Mucus plug), and nephrolithiasis who presents for f/u after recent complex hospitalizations.  Past Medical History    Past Medical History:  Diagnosis Date  . CAD (coronary artery disease)    a. 1999 s/p PTCA of "small vessel" @ [facility];  b. Biannual stress tests w/ PCP, reportedly nl;  c. NSTEMI/Cath 10/11/2016: LM 20%, oLAD 40%, p-mLAD 40% eccentric/calcified, dLAD 40%, p-mLCx 95%, eccentric, sev calcified, pRCA 30%, eccentric, sev calcified, mRCA lesion-1 90% focal & eccentric, sev calcified, mRCA lesion-2 99% sev calcified, EF 55-65%; d. 10/2016 CT Surgery->rec PCI due to poor lung fxn.  . Groin hematoma    a. 10/2016 Spont groin hematoma w/ anemia req 1u prbcs.  . Mucus plugging of bronchi    a. 10/2016 RLL Bronchus obstruction->bronchus vs mass.  . Nephrolithiasis   . NSTEMI (non-ST elevated myocardial infarction) ([place]) 10/11/2016  . Pericardial effusion    a. 10/2016 Echo: EF 60-65%, no rwma, Gr2 DD, small to mod pericardial eff. No tamponade.  . Pneumonia 02/2016  . PVD (peripheral vascular disease) ([place])    a. Abdominal aortogram 10/11/16: Infrarenal abdominal aorta heavily calcified & ectatic with approximately 40-50% stenosis at the bifurcation. Mild diffuse disease w/ heavy calcification noted in the common & external iliac arteries bilaterally  . Type I diabetes mellitus ([place])    a. On insulin pump.  . Ventricular fibrillation ([place])    a. 10/11/2016: Felt to be secondary to demand ischemia in the setting of underlying CAD and anaphylactic reaction from IV Rocephin; b. 10/2016 Echo: EF  55-60%, ? mild inf HK; c. 10/2016 Seen by EP: No ICD indication.   Past Surgical History:  Procedure Laterality Date  . ABDOMINAL AORTOGRAM N/A 10/11/2016   Procedure: ABDOMINAL AORTOGRAM;  Surgeon: Nelva Bush, MD;  Location: [place] [facility];  Service: Cardiovascular;  Laterality: N/A;  . CARDIAC CATHETERIZATION  1999   x1 stent  . LEFT HEART CATH AND CORONARY ANGIOGRAPHY N/A 10/11/2016   Procedure: LEFT HEART CATH AND CORONARY ANGIOGRAPHY;  Surgeon: Nelva Bush, MD;  Location: [place] [facility];  Service: Cardiovascular;  Laterality: N/A;  . TONSILLECTOMY      Allergies  Allergies  Allergen Reactions  . Ceftriaxone     Anaphylaxis, cardiac arrest  . Rocephin [Ceftriaxone Sodium In Dextrose] Anaphylaxis  . Albuterol Other (See Comments)    Tachycardia-->Afib. Tolerates Xopenex    History of Present Illness    66 year old female with the above complex past medical history including coronary artery disease status post ventricular fibrillation arrest in the setting of anaphylaxis left proximal Rocephin for UTI), type 1 diabetes, peripheral vascular disease/abdominal aortic aneurysm, recent spontaneous groin hematoma with anemia requiring packed red blood cells, nephrolithiasis, and right lower lobe bronchus obstruction. She was recently admitted to [facility] regional presented with flank pain and nausea. She was diagnosed with possible UTI treated with IV Rocephin in the emergency department. This was followed by bradycardia and VF arrest. She required intubation and CPR 45 minutes with eventual return of spontaneous circulation. She admitted to the ICU and ruled  in for non-STEMI with troponin peaking at 722. Echocardiogram showed normal LV function with possible inferior hypokinesis. Physician shows severe left circumflex and RCA disease along with an infrarenal abdominal aortic aneurysm and aortic atherosclerosis. She was transferred to [facility] for CT surgical evaluation  however was felt to be a poor surgical candidate center to poor lung function. It was felt at high risk PCI was more appropriate though this is currently deferred. Hospital course, the catheter by right groin hematoma and anemia with drop in hemoglobin from 13-6.8 requiring 1 unit of packed red blood cells. Hemoglobin was stable afterward. During hospice issue, she was also noted paroxysmal atrial fibrillation. She was not initiated on oral into granulation the setting of hematoma. She improved slightly and was able to be weaned from oxygen and subsequently discharged September 21. Unfortunate, she was readmitted to [facility] on September 25 in the setting of progressive dyspnea on exertion. CT angiography was performed in the emergency department and was negative for PE but showed a new right lower lobe bronchus obstruction with question of mucous plugging versus mass. Right lower lobe opacities and pericardial effusion concerning for hemopericardium were also noted. Echocardiogram was performed and showed a mild to moderate pericardial effusion without tamponade physiology. She was seen by pulmonology and placed on antibiotics as well as incentive spirometry and flutter valve with subsequent improvement in breathing and hypoxia. During hospital his age, she had runs of atrial fibrillation and was discharged home in sinus rhythm on short acting diltiazem.  Since discharge, she has been stable. She continues to have some dyspnea on exertion but overall this is improving. Strength is improving. She has not had any chest pain. She is unaware of any palpitations though sometimes they note that her heart rate is home is in the low 100s. She denies PND, orthopnea, dizziness, syncope, edema, or early satiety. She has follow-up with pulmonology in 2 weeks.  Home Medications    Prior to Admission medications   Medication Sig Start Date End Date Taking? Authorizing Provider  ALPRAZolam (XANAX) 0.25 MG tablet  Take 0.25 mg by mouth daily.   Yes [provider]  aspirin EC 81 MG tablet Take 81 mg by mouth daily.   Yes [provider]  diltiazem (CARDIZEM) 30 MG tablet One tablet every 6 hours as needed for heart rate >100 11/02/16  Yes Rogelia Mire, NP  ezetimibe-simvastatin (VYTORIN) 10-20 MG tablet Take 1 tablet by mouth daily.   Yes [provider]  feeding supplement, GLUCERNA SHAKE, (GLUCERNA SHAKE) LIQD Take 237 mLs by mouth 3 (three) times daily between meals. 10/30/16  Yes Epifanio Lesches, MD  furosemide (LASIX) 20 MG tablet Take 1 tablet (20 mg total) by mouth daily. 10/30/16 11/29/16 Yes Epifanio Lesches, MD  insulin aspart (NOVOLOG) 100 UNIT/ML injection Inject 3 Units into the skin 3 (three) times daily with meals. 10/13/16  Yes Vaughan Basta, MD  insulin glargine (LANTUS) 100 UNIT/ML injection Inject 0.15 mLs (15 Units total) into the skin daily. 10/13/16  Yes Vaughan Basta, MD  levofloxacin (LEVAQUIN) 750 MG tablet Take 1 tablet (750 mg total) by mouth daily. 10/30/16  Yes Epifanio Lesches, MD  nitroGLYCERIN (NITROSTAT) 0.4 MG SL tablet Place 1 tablet (0.4 mg total) under the tongue every 5 (five) minutes x 3 doses as needed for chest pain. 10/21/16  Yes Bhagat, Bhavinkumar, PA  diltiazem (CARDIZEM CD) 180 MG 24 hr capsule Take 1 capsule (180 mg total) by mouth daily. 11/02/16 01/31/17  Sharolyn Douglas,  Lisette Grinder, NP    Review of Systems    As above, she is steadily recovering with improved respiratory status. She denies chest pain, palpitations, PND, orthopnea, dizziness, syncope, edema, or early satiety.  All other systems reviewed and are otherwise negative except as noted above.  Physical Exam    VS:  BP 118/62 (BP Location: Right Arm, Patient Position: Sitting, Cuff Size: Normal)   Pulse (!) 126   Ht 5\' 3"  (1.6 m)   Wt 123 lb 4 oz (55.9 kg)   BMI 21.83 kg/m  , BMI Body mass index is 21.83 kg/m. GEN: Well nourished, well  developed, in no acute distress.  HEENT: normal.  Neck: Supple, no JVD, carotid bruits, or masses. Cardiac: RRR, no murmurs, rubs, or gallops. No clubbing, cyanosis, edema.  Radials/DP/PT 2+ and equal bilaterally.  Respiratory:  Respirations regular and unlabored, She continues to have markedly diminished breath sounds in the right base and halfway up. GI: Soft, nontender, nondistended, BS + x 4. MS: no deformity or atrophy. Skin: warm and dry, no rash. Neuro:  Strength and sensation are intact. Psych: Normal affect.  Accessory Clinical Findings    ECG From primary care provider's office this morning reviewed and showed - Sinus tachycardia 102, no acute changes.  Assessment & Plan    1.  Coronary artery disease: Status post recent ventricular fibrillation arrest in the setting of anaphylaxis from Rocephin therapy. She ruled in for non-STEMI and catheterization revealed severe circumflex and right coronary artery disease. Plan for eventual PCI however this is deferred for now in the setting of right lower lobe bronchus obstruction with further pulmonology evaluation pending. She has improved clinically with antibiotics, incentive spirometry, and flutter valve. She is follow-up with pulmonology in 2 weeks. Pending pulmonology evaluation, if she will not require any invasive procedures going forward, we will plan to have her follow-up with Dr. Saunders Revel approximately 3 weeks at which point, we would likely initiate her on Plavix in addition to aspirin and reevaluate blood count to ensure that anemia is stable. Provided that she tolerates aspirin Plavix, we would plan to proceed with PCI of the RCA and left circumflex. Following that, we will need to give consideration for oral anticoagulation related to atrial fibrillation. She will also eventually benefit from cardiac rehabilitation.  2. Right lower lobe bronchus obstruction: Question mucous plug versus mass. She has been improving with antibiotics and  conservative therapy. Follow-up with pulmonology in 2 weeks with repeat CT is planned. We will defer any further cardiac procedures until she is cleared from a lung standpoint in case she requires invasive procedure.  3. Paroxysmal atrial fibrillation: She is in sinus rhythm today and was actually tachycardic. She is on diltiazem 30 mg every 6 hours and so she is overdue for a dose. Unclear why she is on short acting diltiazem. I have prescribed diltiazem CD 180 mg daily. She may use an additional 30 mg for breakthrough A. fib and she is advised to contact us if she is using frequent doses of the short acting Cardizem. She is not currently on oral anticoagulation in the setting of recent groin hematoma with significant anemia and also pending need for PCI and potentially further evaluation by pulmonology. We'll make determination with regards to oral anticoagulation following PCI. CHA2DS2VASc equals 3.  4. Pericardial effusion: This was mild to moderate by echocardiogram in late September. No rub on exam. She is not having significant dyspnea and is hemodynamically stable.  5. Anemia: The setting  of acute blood loss related to groin bleed in late September. H&H was stable throughout recent hospitalization.  6. Type 1 diabetes mellitus: She uses an insulin pump and is followed closely by primary care.  7. Tobacco abuse/COPD: She is no longer smoking. She has completed a steroid taper recently.  8. Hyperlipidemia: Continue statin therapy. Next  9. Peripheral arterial disease: No claudication. Continue aspirin and statin.  10. Disposition:  I did discuss her case with Dr. Saunders Revel today. Plan to follow-up in approximately 3 weeks.   Murray Hodgkins, NP 11/02/2016, 4:17 PM

## 2016-11-04 NOTE — Discharge Summary (Signed)
Lisa [name], is a 66 y.o. female  DOB 1950/04/20  MRN [id number].  Admission date:  10/25/2016  Admitting Physician  Hillary Bow, MD  Discharge Date:  10/30/2016   Primary MD  Lenard Simmer, MD  Recommendations for primary care physician for things to follow:  Follow with PCP in one week   Admission Diagnosis  Shortness of breath [R06.02] Hypoxia [R09.02]   Discharge Diagnosis  Shortness of breath [R06.02] Hypoxia [R09.02]    Active Problems:   COPD exacerbation (HCC)   Atrial fibrillation with rapid ventricular response (HCC)      Past Medical History:  Diagnosis Date  . CAD (coronary artery disease)    a. 1999 s/p PTCA of "small vessel" @ [facility];  b. Biannual stress tests w/ PCP, reportedly nl;  c. NSTEMI/Cath 10/11/2016: LM 20%, oLAD 40%, p-mLAD 40% eccentric/calcified, dLAD 40%, p-mLCx 95%, eccentric, sev calcified, pRCA 30%, eccentric, sev calcified, mRCA lesion-1 90% focal & eccentric, sev calcified, mRCA lesion-2 99% sev calcified, EF 55-65%; d. 10/2016 CT Surgery->rec PCI due to poor lung fxn.  . Groin hematoma    a. 10/2016 Spont groin hematoma w/ anemia req 1u prbcs.  . Mucus plugging of bronchi    a. 10/2016 RLL Bronchus obstruction->bronchus vs mass.  . Nephrolithiasis   . NSTEMI (non-ST elevated myocardial infarction) ([place]) 10/11/2016  . Pericardial effusion    a. 10/2016 Echo: EF 60-65%, no rwma, Gr2 DD, small to mod pericardial eff. No tamponade.  . Pneumonia 02/2016  . PVD (peripheral vascular disease) ([place])    a. Abdominal aortogram 10/11/16: Infrarenal abdominal aorta heavily calcified & ectatic with approximately 40-50% stenosis at the bifurcation. Mild diffuse disease w/ heavy calcification noted in the common & external iliac arteries bilaterally  . Type I diabetes mellitus ([place])    a. On  insulin pump.  . Ventricular fibrillation ([place])    a. 10/11/2016: Felt to be secondary to demand ischemia in the setting of underlying CAD and anaphylactic reaction from IV Rocephin; b. 10/2016 Echo: EF 55-60%, ? mild inf HK; c. 10/2016 Seen by EP: No ICD indication.    Past Surgical History:  Procedure Laterality Date  . ABDOMINAL AORTOGRAM N/A 10/11/2016   Procedure: ABDOMINAL AORTOGRAM;  Surgeon: Nelva Bush, MD;  Location: [facility];  Service: Cardiovascular;  Laterality: N/A;  . CARDIAC CATHETERIZATION  1999   x1 stent  . LEFT HEART CATH AND CORONARY ANGIOGRAPHY N/A 10/11/2016   Procedure: LEFT HEART CATH AND CORONARY ANGIOGRAPHY;  Surgeon: Nelva Bush, MD;  Location: [facility];  Service: Cardiovascular;  Laterality: N/A;  . TONSILLECTOMY         History of present illness and  Hospital Course:     Kindly see H&P for history of present illness and admission details, please review complete Labs, Consult reports and Test reports for all details in brief  HPI  from the history and physical done on the day of admission 66 yr old female admitted for Shortness of breath.pt had anaphylactic reaction to rocephin.and had cardiac arrest and cpr and was in [place].   Hospital Course    Acute hypoxic respiratory failure due to bilateral pneumonitis and COPD exacerbation -admitted to telemetry,imroved with steroids.,nebulizers,dischaged home with steroid taper, Antibiotics. --due to CT scan showing possible mucus plugging versus mass on the bronchus pulmonary saw the patient they feel she is too high risk to do a bronchoscopy, recommends continuing therapy outpatient follow-up with repeat CT   *  atrial fibrillationl; rate controlled with by mouth Cardizem. No anticoagulation at this time due  To h/o significant groin hematoma.  * diabetes mellitus. Continue insulin pump as doing at home, bg elevated to steroids  * CAD with multivessel disease. Not a  good candidate for CABG due to pulmonary issues according to recent cardiac arrest.Cardiology followed the patient.continued on aspirin.    *hyperlipidemia unspecified continue Lipitor   hyponatremia ''sodium  127. No symptoms of shortness of breath. Advised patientto follow-up with cardiology, primary doctor regarding repeat sodium levels in 2-3 days. Discharged to her home with small dose of Lasix.               Discharge Condition:stable   Follow UP  Follow-up Information    Laverle Hobby, MD. Go on 11/22/2016.   Specialty:  Pulmonary Disease Why:  Appointment Time: 9:00am. PLEASE ARRIVE AT 8:45. BRING ALL MEDICATIONS AND INSURANCE CARDS! Contact information: [place] [address] [phone]        Lenard Simmer, MD Follow up in 1 week(s).   Specialty:  Endocrinology Contact information: [place] [address] [phone]        Nelva Bush, MD Follow up in 3 day(s).   Specialty:  Cardiology Contact information: [place] [address] [phone]             Discharge Instructions  and  Discharge Medications     Allergies as of 10/30/2016      Reactions   Ceftriaxone    Anaphylaxis, cardiac arrest   Rocephin [ceftriaxone Sodium In Dextrose] Anaphylaxis   Albuterol Other (See Comments)   Tachycardia-->Afib. Tolerates Xopenex      Medication List    STOP taking these medications   atorvastatin 80 MG tablet Commonly known as:  LIPITOR   diltiazem 360 MG 24 hr capsule Commonly known as:  CARDIZEM CD   oxyCODONE-acetaminophen 5-325 MG tablet Commonly known as:  PERCOCET/ROXICET     TAKE these medications   ALPRAZolam 0.25 MG tablet Commonly known as:  XANAX Take 0.25 mg by mouth daily.   aspirin EC 81 MG tablet Take 81 mg by mouth daily.   ezetimibe-simvastatin 10-20 MG tablet Commonly known as:  VYTORIN Take 1 tablet by mouth  daily.   feeding supplement (GLUCERNA SHAKE) Liqd Take 237 mLs by mouth 3 (three) times daily between meals.   furosemide 20 MG tablet Commonly known as:  LASIX Take 1 tablet (20 mg total) by mouth daily.   insulin aspart 100 UNIT/ML injection Commonly known as:  novoLOG Inject 3 Units into the skin 3 (three) times daily with meals.   insulin glargine 100 UNIT/ML injection Commonly known as:  LANTUS Inject 0.15 mLs (15 Units total) into the skin daily.   levofloxacin 750 MG tablet Commonly known as:  LEVAQUIN Take 1 tablet (750 mg total) by mouth daily.   nitroGLYCERIN 0.4 MG SL tablet Commonly known as:  NITROSTAT Place 1 tablet (0.4 mg total) under the tongue every 5 (five) minutes x 3 doses as needed for chest pain.         Diet and Activity recommendation: See Discharge Instructions above   Consults obtained - cardiology   Major procedures and Radiology Reports - PLEASE review detailed and final reports for all details, in brief -      Dg Chest 1 View  Result Date: 10/11/2016 CLINICAL DATA:  ET and NG tube placement. EXAM: CHEST 1  VIEW COMPARISON:  Chest x-ray from same day. FINDINGS: Unchanged positioning of the endotracheal tube with the tip approximately 3.9 cm above the level of the carina. New enteric tube in place with the tip and distal side port in the gastric body. The cardiomediastinal silhouette is normal in size. No focal consolidation, pleural effusion, or pneumothorax. No acute osseous abnormality. IMPRESSION: 1. Interval placement of an enteric tube with the tip and distal side port in the gastric body. 2.  No active cardiopulmonary disease. Electronically Signed   By: Titus Dubin M.D.   On: 10/11/2016 13:32   Dg Chest 1 View  Result Date: 10/11/2016 CLINICAL DATA:  Post intubation, former smoker, diabetes mellitus EXAM: CHEST 1 VIEW COMPARISON:  Portable exam 1039 hours without priors for comparison FINDINGS: Tip of endotracheal tube projects 4.8  cm above carina. External pacing leads project over chest. Normal heart size, mediastinal contours, and pulmonary vascularity. Lungs clear. No pleural effusion or pneumothorax. Bones demineralized. IMPRESSION: No acute abnormalities. Electronically Signed   By: Lavonia Dana M.D.   On: 10/11/2016 11:52   Ct Angio Chest Pe W Or Wo Contrast  Result Date: 10/25/2016 CLINICAL DATA:  Pt arrived POV for reports of SOB and cough. Pt was seen here recently and had anaphylactic reaction to Rocephin. Pt room air SpO2 84%, pt placed on 3L O2 via Index and SpO2 increased to 92%. Pt reports cough. Bilateral wheezes heard at lung bases EXAM: CT ANGIOGRAPHY CHEST WITH CONTRAST TECHNIQUE: Multidetector CT imaging of the chest was performed using the standard protocol during bolus administration of intravenous contrast. Multiplanar CT image reconstructions and MIPs were obtained to evaluate the vascular anatomy. CONTRAST:  75 cc Isovue 370 COMPARISON:  Chest x-ray 10/25/2016 FINDINGS: Cardiovascular: Pulmonary arteries are well opacified. There is no acute pulmonary embolus. There is extensive 3 vessel coronary artery calcification. Pericardial effusion is present, measuring approximately 11 mm in width. Pericardial effusion appears higher attenuation than simple water density and may contain proteinaceous material, debris, purulent, or bloody fluid. There is extensive atherosclerotic calcification of the thoracic aorta not associated with aneurysm. Mediastinum/Nodes: The visualized portion of the thyroid gland has a normal appearance. Esophagus is normal. Small hiatal hernia. No significant mediastinal, hilar, or axillary adenopathy. Lungs/Pleura: There are numerous ground-glass opacities throughout the lungs bilaterally, primarily involving the upper lobes but also involving the left lower lobe. There is relative sparing of this opacity in the right lower lobe. There is atelectasis and right lower lobe obstruction by a mucus plug  or soft tissue mass. There is bibasilar atelectasis. No significant pleural effusion. Upper Abdomen: Unremarkable. Musculoskeletal: There are Schmorl's nodes at numerous thoracic vertebral levels. Numerous bilateral rib fractures are identified, most of which are probably chronic. Old rib fractures are identified involving at least ribs 2 to through 7. Old right rib fractures are identified, involving at least ribs 2 through 6. Right fifth rib fracture appears acute. Correlation with history of trauma is recommended. Review of the MIP images confirms the above findings. IMPRESSION: 1. Technically adequate exam showing no acute pulmonary embolus. 2. Diffuse parenchymal alveolar infiltrates compatible with infectious or inflammatory process. 3. Right lower lobe bronchus is obstructed by a mucus plug or soft tissue mass. There is right lower lobe atelectasis and relative sparing of airspace filling opacities in the right lower lobe. 4. Cardiomegaly, extensive coronary artery disease, and high attenuation pericardial effusion. Considerations include hemopericardium, pericarditis, or other high attenuation material such as proteinaceous fluid. 5. Small hiatal hernia. 6.  Multiple bilateral rib fractures. Right fifth rib fracture appears acute. Correlation with history of trauma is indicated. Electronically Signed   By: Nolon Nations M.D.   On: 10/25/2016 16:21   US Renal  Result Date: 10/14/2016 CLINICAL DATA:  Kidney stone.  History of cardiac arrest. EXAM: RENAL / URINARY TRACT ULTRASOUND COMPLETE COMPARISON:  Abdominal CT 10/11/2016 FINDINGS: Right Kidney: Length: 11.5 cm. 2.2 cm hilar cyst. No hydronephrosis. Borderline hyperechoic renal cortex. Left Kidney: Length: 12.5 cm. Mild hydronephrosis, improved from CT 3 days ago. No evidence of mass. Bladder: Decompressed by Foley catheter. IMPRESSION: Mild left hydronephrosis, improved from CT 3 days ago. Electronically Signed   By: Monte Fantasia M.D.   On:  10/14/2016 09:05   Ct Femur Right Wo Contrast  Result Date: 10/16/2016 CLINICAL DATA:  Right thigh swelling. Raising vascular catheterization. EXAM: CT OF THE LOWER RIGHT EXTREMITY WITHOUT CONTRAST TECHNIQUE: Multidetector CT imaging of the right lower extremity was performed according to the standard protocol. COMPARISON:  CT pelvis 10/11/2016 FINDINGS: Bones/Joint/Cartilage Degenerative arthropathy of both hips. Small bilateral knee joint effusions. Ligaments Suboptimally assessed by CT. Muscles and Tendons There is no focal hematoma around the common femoral vasculature, but there is considerable high density expansion of the right hip adductor musculature, most notably the pectineus, adductor brevis, and adductor magnus muscles. Comparing to the contralateral side, the volume of adductor hematoma on the right is estimated at 420 cubic cm (I obtain this by subtracting the volume of the normal left-sided muscles from the volume of the expanded right-sided adductor musculature). There are some lower density regions within the musculature likely representing mixed density hematoma. Infection and abscess is considered less likely. Soft tissues Subcutaneous edema noted in the right thigh. Edema tracks along deep fascia planes of the medial compartment of the right thigh. Does Foley catheter in the urinary bladder. IMPRESSION: 1. Expansion and high density in the right hip adductor musculature as detailed above, volume of suspected hematoma at 420 cubic cm. There is edema in surrounding fascia planes and in the subcutaneous tissues of the right thigh. 2. No hematoma is observed along the common femoral vasculature directly. This raises suspicion that the right adductor hematoma is spontaneous. 3. Degenerative arthropathy of both hips. 4. Small bilateral knee joint effusions. Electronically Signed   By: Van Clines M.D.   On: 10/16/2016 14:03   Dg Chest Port 1 View  Result Date: 10/28/2016 CLINICAL DATA:   Shortness of breath. EXAM: PORTABLE CHEST 1 VIEW COMPARISON:  CT 10/25/2016.  Chest x-ray 10/25/2016 . FINDINGS: Mediastinum and hilar structures normal. Stable cardiomegaly. Multifocal bilateral pulmonary infiltrates, improved from prior exam. Small bilateral pleural effusions. No pneumothorax. IMPRESSION: 1. Multifocal bilateral pulmonary infiltrates, slightly improved from prior CT and chest x-ray of 10/25/2016. Continued follow-up exams can be obtained. Small bilateral pleural effusions. 2. Stable cardiomegaly . Electronically Signed   By: Marcello Moores  Register   On: 10/28/2016 08:24   Dg Chest Port 1 View  Result Date: 10/25/2016 CLINICAL DATA:  reports of SOB and cough. Pt was seen here recently and had anaphylactic reaction to rocephin last week, history of CAD, NSTEMI, diabetes, ventricular fibrillation EXAM: PORTABLE CHEST 1 VIEW COMPARISON:  10/19/2016 FINDINGS: There is elevation of the RIGHT hemidiaphragm not changed from 10/19/2016 but new from 10/11/2016. Normal cardiac silhouette. Lungs relatively clear. Mild RIGHT basilar atelectasis. No acute osseous abnormality. IMPRESSION: Elevation of the RIGHT hemidiaphragm is new from 10/11/2016 with differential including subpulmonic effusion versus potential diaphragmatic paralysis. Electronically Signed  By: Suzy Bouchard M.D.   On: 10/25/2016 15:11   Dg Chest Port 1 View  Result Date: 10/19/2016 CLINICAL DATA:  Acute respiratory failure, hypoxia EXAM: PORTABLE CHEST 1 VIEW COMPARISON:  10/18/2016 FINDINGS: Right base atelectasis with mildly elevated right hemidiaphragm. Cardiomegaly. Left lung is clear. No effusions or acute bony abnormality. IMPRESSION: Right base atelectasis.  Mild cardiomegaly. Electronically Signed   By: Rolm Baptise M.D.   On: 10/19/2016 08:42   Dg Chest Port 1 View  Result Date: 10/18/2016 CLINICAL DATA:  Acute respiratory failure EXAM: PORTABLE CHEST 1 VIEW COMPARISON:  10/17/2016 FINDINGS: Cardiac shadow is mildly  enlarged but stable. Aortic calcifications are again seen. Lungs are well-aerated with right basilar atelectatic changes. Some mild interstitial changes are seen particularly on the left but stable. No new focal abnormality is seen. IMPRESSION: No significant interval change from the prior exam. Electronically Signed   By: Inez Catalina M.D.   On: 10/18/2016 07:56   Dg Chest Port 1 View  Result Date: 10/17/2016 CLINICAL DATA:  Cough, acute respiratory failure, hypoxia EXAM: PORTABLE CHEST 1 VIEW COMPARISON:  10/16/2016 FINDINGS: Cardiomegaly. Diffuse left lung airspace disease, most confluent in the left upper lobe. Right basilar atelectasis or infiltrate. Findings are similar to prior study. Suspect small right effusion. IMPRESSION: Stable bilateral airspace opacities, left greater than right. Suspect small effusion on the right. No real change. Electronically Signed   By: Rolm Baptise M.D.   On: 10/17/2016 07:46   Dg Chest Port 1 View  Result Date: 10/16/2016 CLINICAL DATA:  Shortness of breath. EXAM: PORTABLE CHEST 1 VIEW COMPARISON:  October 15, 2016 FINDINGS: Left greater than right pulmonary opacities persist, improved in the interval. No other interval changes. IMPRESSION: Improving but persistent left greater than right pulmonary opacities. Electronically Signed   By: Dorise Bullion III M.D   On: 10/16/2016 08:40   Dg Chest Port 1 View  Result Date: 10/15/2016 CLINICAL DATA:  Followup acute respiratory failure. EXAM: PORTABLE CHEST 1 VIEW COMPARISON:  10/14/2016 FINDINGS: Widespread bilateral pneumonia persists, with slight radiographic improvement. Small amount of a fusion and focal volume loss in the right lower lobe. No worsening or new findings. IMPRESSION: Radiographic improvement of diffuse pneumonia. Some persistent volume loss at the right base. Electronically Signed   By: Nelson Chimes M.D.   On: 10/15/2016 07:49   Dg Chest Port 1 View  Result Date: 10/14/2016 CLINICAL DATA:  66 y/o   F; respiratory distress. EXAM: PORTABLE CHEST 1 VIEW COMPARISON:  10/13/2016 chest radiograph FINDINGS: Asymmetric consolidations of the lungs are stable from prior chest radiographs. Small to moderate right pleural effusion. Stable cardiac silhouette within normal limits. Aortic atherosclerosis with calcification. No acute osseous abnormality. IMPRESSION: Stable asymmetric consolidations throughout the lungs and small to moderate right effusion. Differential includes multifocal pneumonia and pulmonary edema. Electronically Signed   By: Kristine Garbe M.D.   On: 10/14/2016 01:30   Dg Chest Port 1 View  Result Date: 10/13/2016 CLINICAL DATA:  Shortness of breath today EXAM: PORTABLE CHEST 1 VIEW COMPARISON:  Earlier today FINDINGS: Continued progression of bilateral airspace disease, asymmetric to the left. Normal heart size for technique. Negative mediastinal contours. Small if any pleural effusions. No pneumothorax. IMPRESSION: Progression of asymmetric bilateral airspace disease favoring pneumonia or noncardiogenic edema (was there aspiration at time of cardiac arrest?). Electronically Signed   By: Monte Fantasia M.D.   On: 10/13/2016 13:44   Dg Chest Port 1 View  Result Date: 10/13/2016 CLINICAL DATA:  Respiratory failure. EXAM: PORTABLE CHEST 1 VIEW COMPARISON:  10/11/2016. FINDINGS: Interim removal of endotracheal tube and NG tube. Heart size normal. New onset of diffuse bilateral pulmonary interstitial prominence noted. Findings consistent with a process such as interstitial pneumonitis or interstitial edema. Small right pleural effusion. No pneumothorax . IMPRESSION: 1.  Interim removal of endotracheal tube and NG tube. 2. New onset of diffuse bilateral pulmonary interstitial prominence consistent with a process such as interstitial pneumonitis or interstitial edema. Small right pleural effusion. Electronically Signed   By: Marcello Moores  Register   On: 10/13/2016 06:19   Dg Abd Portable  1v  Result Date: 10/14/2016 CLINICAL DATA:  Ureteral calculus, abdominal pain EXAM: PORTABLE ABDOMEN - 1 VIEW COMPARISON:  CT abdomen and pelvis 10/11/2016 FINDINGS: LEFT pelvic phleboliths again identified. Distal LEFT ureteral calculus seen on prior CT exam is not visualized on current study. RIGHT femoral line noted. Bowel gas pattern normal. Diffuse osseous demineralization. IMPRESSION: Previously identified distal LEFT ureteral calculus is no longer seen. Electronically Signed   By: Lavonia Dana M.D.   On: 10/14/2016 07:40   Ct Renal Stone Study  Result Date: 10/11/2016 CLINICAL DATA:  Left flank pain.  History of renal stones. EXAM: CT ABDOMEN AND PELVIS WITHOUT CONTRAST TECHNIQUE: Multidetector CT imaging of the abdomen and pelvis was performed following the standard protocol without IV contrast. COMPARISON:  None. FINDINGS: Lower chest: Minimal atelectasis in the lung bases. No pleural effusion. Three-vessel coronary artery atherosclerosis. Normal heart size. No pericardial effusion. Hepatobiliary: No focal liver abnormality is seen. No gallstones, gallbladder wall thickening, or biliary dilatation. Pancreas: Mildly truncated appearance of the pancreatic tail. No ductal dilatation or surrounding inflammatory changes. Spleen: Unremarkable. Adrenals/Urinary Tract: Unremarkable adrenal glands. Punctate nonobstructing calculus in the lower pole of the right kidney. Three punctate nonobstructing left renal calculi. 4 mm obstructing calculus in the distal left ureter near the UVJ resulting in mild hydroureteronephrosis. Mild left perinephric stranding. Unremarkable bladder. Stomach/Bowel: Small sliding hiatal hernia. No evidence of bowel obstruction or inflammation. Unremarkable appendix. Vascular/Lymphatic: Extensive atherosclerosis of the abdominal aorta with mild infrarenal aortic ectasia measuring up to 2.8 cm diameter. Central displacement of intimal calcification more distally in the aorta over a length  of 2.5 cm suggests a short segment dissection. No enlarged lymph nodes. Reproductive: 9 mm calcification in the uterine fundus likely reflecting a small fibroid. Unremarkable ovaries. Other: No intraperitoneal free fluid. Small fat containing umbilical hernia. Musculoskeletal: Mild L4 and L5 superior endplate compression fractures, chronic in appearance. Mild spondylosis and moderate posterior element hypertrophy in the lower lumbar spine with likely mild spinal stenosis at L3-4 and L4-5. IMPRESSION: 1. 4 mm distal left ureteral calculus with mild hydroureteronephrosis. 2. Punctate nonobstructing bilateral renal calculi. 3. Small hiatal hernia. 4. Aortic Atherosclerosis (ICD10-I70.0). Mild infrarenal aortic ectasia and suspected short segment aortic dissection. Electronically Signed   By: Logan Bores M.D.   On: 10/11/2016 08:20    Micro Results     No results found for this or any previous visit (from the past 240 hour(s)).     Today   Subjective:   Lisa [name] today has no headache,no chest abdominal pain,no new weakness tingling or numbness, feels much better wants to go home today.  Objective:   Blood pressure 139/89, pulse 90, temperature 97.8 F (36.6 C), temperature source Oral, resp. rate 18, height 5\' 3"  (1.6 m), weight 56.9 kg (125 lb 6.4 oz), SpO2 96 %.  No intake or output data in the 24 hours ending 11/04/16  2151  Exam Awake Alert, Oriented x 3, No new F.N deficits, Normal affect [place].AT,PERRAL Supple Neck,No JVD, No cervical lymphadenopathy appriciated.  Symmetrical Chest wall movement, Good air movement bilaterally, CTAB RRR,No Gallops,Rubs or new Murmurs, No Parasternal Heave +ve B.Sounds, Abd Soft, Non tender, No organomegaly appriciated, No rebound -guarding or rigidity. No Cyanosis, Clubbing or edema, No new Rash or bruise  Data Review   CBC w Diff:  Lab Results  Component Value Date   WBC 21.6 (H) 10/27/2016   HGB 9.9 (L) 10/27/2016   HCT 28.8 (L)  10/27/2016   PLT 579 (H) 10/27/2016   LYMPHOPCT 4 10/12/2016   MONOPCT 4 10/12/2016   EOSPCT 0 10/12/2016   BASOPCT 0 10/12/2016    CMP:  Lab Results  Component Value Date   NA 127 (L) 10/30/2016   K 3.8 10/30/2016   CL 91 (L) 10/30/2016   CO2 28 10/30/2016   BUN 25 (H) 10/30/2016   CREATININE 0.70 10/30/2016   PROT 7.0 10/26/2016   ALBUMIN 3.1 (L) 10/26/2016   BILITOT 1.4 (H) 10/26/2016   ALKPHOS 112 10/26/2016   AST 47 (H) 10/26/2016   ALT 41 10/26/2016  .   Total Time in preparing paper work, data evaluation and todays exam - 71 minutes  Martha Soltys M.D on 09/30//2018 at 9:51 PM    Note: This dictation was prepared with Dragon dictation along with smaller phrase technology. Any transcriptional errors that result from this process are unintentional.

## 2016-11-17 ENCOUNTER — Ambulatory Visit
Admission: RE | Admit: 2016-11-17 | Discharge: 2016-11-17 | Disposition: A | Discharge status: Home / Self Care | Payer: Managed Care, Other (non HMO) | Source: Ambulatory Visit | Attending: Internal Medicine | Admitting: Internal Medicine

## 2016-11-17 DIAGNOSIS — J9811 Atelectasis: Secondary | ICD-10-CM | POA: Insufficient documentation

## 2016-11-17 DIAGNOSIS — S2243XA Multiple fractures of ribs, bilateral, initial encounter for closed fracture: Secondary | ICD-10-CM | POA: Insufficient documentation

## 2016-11-17 DIAGNOSIS — I2510 Atherosclerotic heart disease of native coronary artery without angina pectoris: Secondary | ICD-10-CM | POA: Diagnosis not present

## 2016-11-17 DIAGNOSIS — I700 Atherosclerosis of aorta: Secondary | ICD-10-CM | POA: Insufficient documentation

## 2016-11-17 DIAGNOSIS — X58XXXA Exposure to other specified factors, initial encounter: Secondary | ICD-10-CM | POA: Insufficient documentation

## 2016-11-21 NOTE — Progress Notes (Signed)
* [facility]     Assessment and Plan:  The patient is a 66 year old female admitted to the hospital recently with cardiac arrest due to MI, course complicated by pulmonary edema.  RLL endobronchial filling defect appears to have resolved, and was likely representative of mucous plugging. Right lower lobe streaky atelectasis, significantly improved from previous.  Pt would be at high risk of complications with any potential bronchoscopy due to recent cardiac arrest, NSTEMI,  Excessive daytime sleepiness. Possible COPD/emphysema with continued nicotine abuse.  --Continue to increase activity after clearance by cardiology. --Will check PFT in 3 months, and follow-up in 6 months time, for possible emphysema. --Discussed the importance of smoking cessation, discussed for greater than 3 minutes. -Discussed the possible need for a sleep study, however she would like to get through her upcoming possible cardiac catheterization and her other cardiac issues first, therefore will defer this.    Date: 11/21/2016  MRN# [id number] Lisa [name] [birth year]   Lisa [name] is a 66 y.o. old female seen in follow up for chief complaint of  Chief Complaint  Patient presents with  . Hospitalization Follow-up    Pt states she feels better denies cough,sob, chest tightness, wheezing. She only has sore ribs.     HPI:   She has been feeling better, she thinks that she is doing better, but some days she is very tired. She denies chest pain but still has chest soreness from the CPR. She does not do any exercise, she is seeing cardiologist tomorrow.  She is not limited by her breathing, but more by fatigue. Husband is present and notes that she snores at night, she is sleepy during the day.   She is smoking 2 or 3 cigs per day, she wants to quit, her husband also smokes. She uses no inhalers.   Images personally reviewed; repeat CT chest 11/17/16 in comparison with previous;  groundglass changes seen on the previous CT imaging have completely resolved, the right lower lobe endobronchial lesion filling defect has resolved, there is streaky atelectasis remaining in the right base.  This is significantly improved from the previous image   Medication:    Current Outpatient Prescriptions:  .  ALPRAZolam (XANAX) 0.25 MG tablet, Take 0.25 mg by mouth daily., Disp: , Rfl:  .  aspirin EC 81 MG tablet, Take 81 mg by mouth daily., Disp: , Rfl:  .  diltiazem (CARDIZEM CD) 180 MG 24 hr capsule, Take 1 capsule (180 mg total) by mouth daily., Disp: 90 capsule, Rfl: 3 .  diltiazem (CARDIZEM) 30 MG tablet, One tablet every 6 hours as needed for heart rate >100, Disp: 30 tablet, Rfl: 5 .  ezetimibe-simvastatin (VYTORIN) 10-20 MG tablet, Take 1 tablet by mouth daily., Disp: , Rfl:  .  feeding supplement, GLUCERNA SHAKE, (GLUCERNA SHAKE) LIQD, Take 237 mLs by mouth 3 (three) times daily between meals., Disp: 30 Can, Rfl: 0 .  furosemide (LASIX) 20 MG tablet, Take 1 tablet (20 mg total) by mouth daily., Disp: 30 tablet, Rfl: 0 .  insulin aspart (NOVOLOG) 100 UNIT/ML injection, Inject 3 Units into the skin 3 (three) times daily with meals., Disp: 10 mL, Rfl: 11 .  insulin glargine (LANTUS) 100 UNIT/ML injection, Inject 0.15 mLs (15 Units total) into the skin daily., Disp: 10 mL, Rfl: 11 .  levofloxacin (LEVAQUIN) 750 MG tablet, Take 1 tablet (750 mg total) by mouth daily., Disp: 5 tablet, Rfl: 0 .  nitroGLYCERIN (NITROSTAT) 0.4 MG SL tablet, Place  1 tablet (0.4 mg total) under the tongue every 5 (five) minutes x 3 doses as needed for chest pain., Disp: 25 tablet, Rfl: 12   Allergies:  Ceftriaxone; Rocephin [ceftriaxone sodium in dextrose]; and Albuterol  Review of Systems: Gen:  Denies  fever, sweats. HEENT: Denies blurred vision. Cvc:  No dizziness, chest pain or heaviness Resp:   Denies cough or sputum porduction. Gi: Denies swallowing difficulty, stomach pain. constipation, bowel  incontinence Gu:  Denies bladder incontinence, burning urine Ext:   No Joint pain, stiffness. Skin: No skin rash, easy bruising. Endoc:  No polyuria, polydipsia. Psych: No depression, insomnia. Other:  All other systems were reviewed and found to be negative other than what is mentioned in the HPI.   Physical Examination:   VS: BP 134/86 (BP Location: Left Arm, Cuff Size: Normal)   Pulse (!) 109   Resp 16   Ht 5\' 3"  (1.6 m)   Wt 130 lb (59 kg)   SpO2 99%   BMI 23.03 kg/m   General Appearance: No distress  Neuro:without focal findings,  speech normal,  HEENT: PERRLA, EOM intact. Pulmonary: normal breath sounds, No wheezing.   CardiovascularNormal S1,S2.  No m/r/g.   Abdomen: Benign, Soft, non-tender. Renal:  No costovertebral tenderness  GU:  Not performed at this time. Endoc: No evident thyromegaly, no signs of acromegaly. Skin:   warm, no rash. Extremities: normal, no cyanosis, clubbing.   LABORATORY PANEL:   CBC No results for input(s): WBC, HGB, HCT, PLT in the last 168 hours. ------------------------------------------------------------------------------------------------------------------  Chemistries  No results for input(s): NA, K, CL, CO2, GLUCOSE, BUN, CREATININE, CALCIUM, MG, AST, ALT, ALKPHOS, BILITOT in the last 168 hours.  Invalid input(s): GFRCGP ------------------------------------------------------------------------------------------------------------------  Cardiac Enzymes No results for input(s): TROPONINI in the last 168 hours. ------------------------------------------------------------  RADIOLOGY:   No results found for this or any previous visit. No results found for this or any previous visit. ------------------------------------------------------------------------------------------------------------------  Thank  you for allowing [facility] to assist in the care of your patient. Our recommendations are noted above.   Please contact us if we can be of further service.   Marda Stalker, MD.   Pulmonary and Critical Care Office Number: [phone]  Patricia Pesa, M.D.  Merton Border, M.D  11/21/2016

## 2016-11-22 ENCOUNTER — Ambulatory Visit (INDEPENDENT_AMBULATORY_CARE_PROVIDER_SITE_OTHER): Payer: Managed Care, Other (non HMO) | Admitting: Internal Medicine

## 2016-11-22 ENCOUNTER — Encounter: Payer: Self-pay | Admitting: Internal Medicine

## 2016-11-22 VITALS — BP 134/86 | HR 109 | Resp 16 | Ht 63.0 in | Wt 130.0 lb

## 2016-11-22 DIAGNOSIS — R0609 Other forms of dyspnea: Secondary | ICD-10-CM

## 2016-11-22 DIAGNOSIS — F17210 Nicotine dependence, cigarettes, uncomplicated: Secondary | ICD-10-CM

## 2016-11-22 DIAGNOSIS — Z720 Tobacco use: Secondary | ICD-10-CM

## 2016-11-22 DIAGNOSIS — J449 Chronic obstructive pulmonary disease, unspecified: Secondary | ICD-10-CM | POA: Diagnosis not present

## 2016-11-22 NOTE — Patient Instructions (Signed)
--  Quitting smoking is the most important thing that you can do for your health.  --Quitting smoking will have greater affect on your health than any medicine that we can give you.   --The best way to quit is to set a quit date, usually a day that has meaning like someone's birthday.  --Start any medication prescribed for quitting one week before you quit date. Then toss out the cigarettes on your quit date.  --If you start smoking again, start from scratch--set another quit day and try again!  

## 2016-11-23 ENCOUNTER — Other Ambulatory Visit
Admission: RE | Admit: 2016-11-23 | Discharge: 2016-11-23 | Disposition: A | Discharge status: Home / Self Care | Payer: Managed Care, Other (non HMO) | Source: Ambulatory Visit | Attending: Internal Medicine | Admitting: Internal Medicine

## 2016-11-23 ENCOUNTER — Ambulatory Visit (INDEPENDENT_AMBULATORY_CARE_PROVIDER_SITE_OTHER): Payer: Managed Care, Other (non HMO) | Admitting: Internal Medicine

## 2016-11-23 ENCOUNTER — Encounter: Payer: Self-pay | Admitting: Internal Medicine

## 2016-11-23 VITALS — BP 112/62 | HR 94 | Ht 63.0 in | Wt 129.0 lb

## 2016-11-23 DIAGNOSIS — J189 Pneumonia, unspecified organism: Secondary | ICD-10-CM | POA: Diagnosis not present

## 2016-11-23 DIAGNOSIS — I469 Cardiac arrest, cause unspecified: Secondary | ICD-10-CM

## 2016-11-23 DIAGNOSIS — Z01810 Encounter for preprocedural cardiovascular examination: Secondary | ICD-10-CM | POA: Diagnosis present

## 2016-11-23 DIAGNOSIS — I4901 Ventricular fibrillation: Secondary | ICD-10-CM | POA: Diagnosis present

## 2016-11-23 DIAGNOSIS — E785 Hyperlipidemia, unspecified: Secondary | ICD-10-CM | POA: Diagnosis not present

## 2016-11-23 DIAGNOSIS — I739 Peripheral vascular disease, unspecified: Secondary | ICD-10-CM

## 2016-11-23 DIAGNOSIS — I2510 Atherosclerotic heart disease of native coronary artery without angina pectoris: Secondary | ICD-10-CM

## 2016-11-23 LAB — CBC WITH DIFFERENTIAL/PLATELET
Basophils Absolute: 0.1 10*3/uL (ref 0–0.1)
Basophils Relative: 1 %
EOS PCT: 3 %
Eosinophils Absolute: 0.3 10*3/uL (ref 0–0.7)
HEMATOCRIT: 36.6 % (ref 35.0–47.0)
HEMOGLOBIN: 11.7 g/dL — AB (ref 12.0–16.0)
LYMPHS ABS: 2.7 10*3/uL (ref 1.0–3.6)
LYMPHS PCT: 32 %
MCH: 27.6 pg (ref 26.0–34.0)
MCHC: 32 g/dL (ref 32.0–36.0)
MCV: 86.1 fL (ref 80.0–100.0)
Monocytes Absolute: 0.9 10*3/uL (ref 0.2–0.9)
Monocytes Relative: 11 %
NEUTROS ABS: 4.5 10*3/uL (ref 1.4–6.5)
Neutrophils Relative %: 53 %
PLATELETS: 335 10*3/uL (ref 150–440)
RBC: 4.25 MIL/uL (ref 3.80–5.20)
RDW: 15.9 % — ABNORMAL HIGH (ref 11.5–14.5)
WBC: 8.5 10*3/uL (ref 3.6–11.0)

## 2016-11-23 LAB — BASIC METABOLIC PANEL
ANION GAP: 9 (ref 5–15)
BUN: 16 mg/dL (ref 6–20)
CHLORIDE: 99 mmol/L — AB (ref 101–111)
CO2: 26 mmol/L (ref 22–32)
Calcium: 9.2 mg/dL (ref 8.9–10.3)
Creatinine, Ser: 0.9 mg/dL (ref 0.44–1.00)
GFR calc non Af Amer: >60 mL/min (ref >60)
Glucose, Bld: 112 mg/dL — ABNORMAL HIGH (ref 65–99)
POTASSIUM: 3.9 mmol/L (ref 3.5–5.1)
SODIUM: 134 mmol/L — AB (ref 135–145)

## 2016-11-23 LAB — PROTIME-INR
INR: 0.94
PROTHROMBIN TIME: 12.5 s (ref 11.4–15.2)

## 2016-11-23 MED ORDER — CLOPIDOGREL BISULFATE 75 MG PO TABS: 75.0000 mg | ORAL_TABLET | Freq: Every day | ORAL | 3 refills | Status: DC

## 2016-11-23 NOTE — Progress Notes (Signed)
Follow-up Outpatient Visit Date: 11/23/2016  Primary Care Provider: Lenard Simmer, MD [place] [address]  Chief Complaint: Follow-up coronary artery disease  HPI:  Lisa [name] is a 66 y.o. year-old female with history of coronary artery disease status post remote coronary intervention at [place] (the late 90s) and recent cardiac arrest in the setting of anaphylaxis with catheterization demonstrating severe 2 vessel CAD, 1 diabetes mellitus, peripheral vascular disease with AAA, right lower lobe pneumonia with mucous plugging, and spontaneous right thigh hematoma in the setting of anticoagulation, who presents for follow-up of coronary artery disease. I met Lisa [name] in September, when she presented with flank pain. She was diagnosed with a ureteral stone and possible UTI and started on ceftriaxone. She suffered anaphylaxis in the emergency department complicated by VF and subsequent complete heart block. She was taken for emergent cardiac catheterization which revealed severe 2 vessel CAD involving the LCx and RCA. With improvement of blood pressure, her complete heart block resolved. She made a good neurologic recovery and was transferred to [facility] for cardiac surgery evaluation. She was felt to be or surgical candidate and PCI was recommended. This was not performed due to spontaneous right thigh hematoma while on heparin requiring blood transfusion. She was subsequently rehospitalized in late September with pneumonia and endobronchial obstruction concerning for mucous plug versus tumor. Follow-up CT of the chest showed resolution of endobronchial lesion, consistent with a mucous plug.  Today, Lisa [name] reports that she has been feeling well. She still has some soreness in the chest wall from CPR at the time of her cardiac arrest in early September. She otherwise denies chest pain, shortness of breath, palpitations, lightheadedness, orthopnea, PND, and edema. Her right thigh  is back to normal. She has not had any bleeding elsewhere.  --------------------------------------------------------------------------------------------------  Cardiovascular History & Procedures: Cardiovascular Problems:  Coronary artery disease with VF arrest and transient complete heart block in the setting of anaphylactic shock  Risk Factors:  Known coronary artery disease, peripheral vascular disease, and type 1 diabetes mellitus,  Cath/PCI:  LHC (10/11/16): Severe two-vessel coronary artery disease including heavily calcified 95% proximal/mid LCx and sequential 90-99% mid RCA stenoses. Mild to moderate, nonobstructive disease involving the LMCA and LAD. Basal inferior hypokinesis with overall preserved LV contraction. Mildly elevated LVEDP. Peripheral vascular disease with ectasia and calcification of the infrarenal abdominal aorta and iliac arteries.  CV Surgery:  None  EP Procedures and Devices:  None  Non-Invasive Evaluation(s):  TTE (10/25/16): Normal LV size with mild to moderate LVH. LVEF 60-65% with grade 2 diastolic dysfunction. RV size and function. Normal PA pressure. Small-to-moderate pericardial effusion.  TTE (10/12/16): Normal LV size. LVEF 55-60%. Normal diastolic function. Normal RV size and function. Normal PA pressure.  Recent CV Pertinent Labs: Lab Results  Component Value Date   CHOL 81 10/13/2016   HDL 34 (L) 10/13/2016   LDLCALC 36 10/13/2016   TRIG 56 10/13/2016   CHOLHDL 2.4 10/13/2016   INR 1.07 10/15/2016   BNP 69.0 10/25/2016   K 3.8 10/30/2016   MG 1.5 (L) 10/26/2016   BUN 25 (H) 10/30/2016   CREATININE 0.70 10/30/2016    Past medical and surgical history were reviewed and updated in EPIC.  Current Meds  Medication Sig  . ALPRAZolam (XANAX) 0.25 MG tablet Take 0.25 mg by mouth daily.  Marland Kitchen aspirin EC 81 MG tablet Take 81 mg by mouth daily.  Marland Kitchen diltiazem (CARDIZEM CD) 180 MG 24 hr capsule Take 1  capsule (180 mg total) by mouth daily.  Marland Kitchen  diltiazem (CARDIZEM) 30 MG tablet One tablet every 6 hours as needed for heart rate >100  . ezetimibe-simvastatin (VYTORIN) 10-20 MG tablet Take 1 tablet by mouth daily.  . feeding supplement, GLUCERNA SHAKE, (GLUCERNA SHAKE) LIQD Take 237 mLs by mouth 3 (three) times daily between meals.  . insulin lispro (HUMALOG) 100 UNIT/ML cartridge Inject into the skin 3 (three) times daily with meals.  . nitroGLYCERIN (NITROSTAT) 0.4 MG SL tablet Place 1 tablet (0.4 mg total) under the tongue every 5 (five) minutes x 3 doses as needed for chest pain.    Allergies: Ceftriaxone; Rocephin [ceftriaxone sodium in dextrose]; and Albuterol  Social History   Social History  . Marital status: Married    Spouse name: N/A  . Number of children: N/A  . Years of education: N/A   Occupational History  . Not on file.   Social History Main Topics  . Smoking status: Former Smoker    Packs/day: 0.50    Years: 47.00    Types: Cigarettes    Start date: 01/25/1969  . Smokeless tobacco: Never Used     Comment: Second-hand exposure through her husband & father as well.  . Alcohol use No     Comment: occassional  . Drug use: No  . Sexual activity: Not Currently   Other Topics Concern  . Not on file   Social History Narrative   [facility] (10/14/16):   Patient has primarily done office work. Married for approximately 1 year. Has a dog at home but no bird exposure. No mold exposure.    Family History  Problem Relation Age of Onset  . Hypertension Mother   . Aortic aneurysm Father   . Hypertension Brother   . Lung disease Neg Hx   . Rheumatologic disease Neg Hx     Review of Systems: A 12-system review of systems was performed and was negative except as noted in the HPI.  --------------------------------------------------------------------------------------------------  Physical Exam: BP 112/62 (BP Location: Left Arm, Patient Position: Sitting, Cuff Size: Normal)   Pulse 94   Ht _0  (1.6  m)   Wt 129 lb (58.5 kg)   BMI 22.85 kg/m    General:  Well-developed, well-nourished woman, seated comfortably in the exam room. She is accompanied by her husband and daughter. HEENT: No conjunctival pallor or scleral icterus. Moist mucous membranes.  OP clear. Neck: Supple without lymphadenopathy, thyromegaly, JVD, or HJR.  Lungs: Normal work of breathing. Clear to auscultation bilaterally without wheezes or crackles. Heart: Regular rate and rhythm without murmurs, rubs, or gallops. Non-displaced PMI. Abd: Bowel sounds present. Soft, NT/ND without hepatosplenomegaly Ext: No lower extremity edema. Radial, PT, and DP pulses are 2+ bilaterally. Right thigh is soft and without bruising or hematoma. 2+ right femoral artery pulse. Skin: Warm and dry without rash.  EKG:  Normal sinus rhythm with left atrial enlargement. Otherwise, no significant abnormalities.  Lab Results  Component Value Date   WBC 21.6 (H) 10/27/2016   HGB 9.9 (L) 10/27/2016   HCT 28.8 (L) 10/27/2016   MCV 84.7 10/27/2016   PLT 579 (H) 10/27/2016    Lab Results  Component Value Date   NA 127 (L) 10/30/2016   K 3.8 10/30/2016   CL 91 (L) 10/30/2016   CO2 28 10/30/2016   BUN 25 (H) 10/30/2016   CREATININE 0.70 10/30/2016   GLUCOSE 258 (H) 10/30/2016   ALT 41 10/26/2016    Lab Results  Component Value Date   CHOL 81 10/13/2016   HDL 34 (L) 10/13/2016   LDLCALC 36 10/13/2016   TRIG 56 10/13/2016   CHOLHDL 2.4 10/13/2016    --------------------------------------------------------------------------------------------------  ASSESSMENT AND PLAN: Coronary artery disease without angina and recent VF arrest Cardiac arrest occurred in the setting of anaphylactic shock, likely representing supply-demand mismatch with severe two-vessel coronary artery disease. Though she does not report any angina, her CAD and recent cardiac arrest certainly place her at high risk for future cardiac events. She was evaluated by  cardiac surgery during her hospitalization in September; PCI was recommended over CABG. I have reviewed the catheterization films from September with Lisa [name] and her family. She has dense calcification involving the mid RCA and proximal LCx. We have agreed to proceed with PCI of the RCA +/- LCx. We will need to use atherectomy with temporary transvenous pacemaker placement. I favor beginning with the right coronary artery. Depending on contrast use and patient's condition, we will consider treating the LCx at that time versus as a staged procedure.  I have advised Lisa [name] that the heavy calcification and eccentricity of her lesions increases the risk for complication including dissection and coronary perforation.  Given her recent spontaneous right thigh hematoma, I would like to challenge her with dual antiplatelet therapy before the PCI.  We will have her start clopidogrel today, checking a CBC today and again in 1 week.  If her hemoglobin is stable, we will proceed with PCI next Friday (12/02/16).  Pneumonia Endobronchial lesion with postobstructive pneumonia has resolved.  I appreciate input from pulmonary.  Given that this does not appear to be a lung malignancy, we will proceed with PCI, as above.  Hyperlipidemia Continue statin therapy.  Peripheral vascular disease Aortoiliac disease noted at the time of catheterization.  Lisa [name] does not have any claudication.  No further workup at this time.  Follow-up: To be determined based on results of PCI.  Nelva Bush, MD 11/23/2016 3:07 PM

## 2016-11-23 NOTE — Patient Instructions (Addendum)
Medication Instructions:  Your physician has recommended you make the following change in your medication:  1- START Plavix. Take 300 mg (4 tablets) by mouth for first dose today or tomorrow, then take 75 mg (1 tablet) by mouth once a day.   Labwork: Your physician recommends that you return for lab work in: TODAY (CBC without diff). - Please go to the [facility]. You will check in at the front desk to the right as you walk into the atrium. Valet Parking is offered if needed.  Your physician recommends that you return for lab work in: 1 week on 11/30/16. (CBC, BMP, PT/INR). - Please go to the [facility]. You will check in at the front desk to the right as you walk into the atrium. Valet Parking is offered if needed.     Testing/Procedures:   You are scheduled for a Cardiac Catheterization on Friday, November 2 with Dr. Harrell Gave End.  1. Please arrive at the [facility] (Main Entrance A) at [facility]: [place], [place] [address] at 5:30 AM (two hours before your procedure to ensure your preparation). Free valet parking service is available.   Special note: Every effort is made to have your procedure done on time. Please understand that emergencies sometimes delay scheduled procedures.  2. Diet: Do not eat or drink anything after midnight prior to your procedure except sips of water to take medications.  3. Labs: Your labs will be performed at the hospital after you arrive for your procedure.  4. Medication instructions in preparation for your procedure:   Do not take any insulin the morning of procedure  On the morning of your procedure, take your Plavix/Clopidogrel/Aspirin and any morning medicines NOT listed above.  You may use sips of water.  5. Plan for one night stay--bring personal belongings. 6. Bring a current list of your medications and current insurance cards. 7. You MUST have a responsible person to drive you home. 8. Someone  MUST be with you the first 24 hours after you arrive home or your discharge will be delayed. 9. Please wear clothes that are easy to get on and off and wear slip-on shoes.  Thank you for allowing [place] to care for you!   -- [facility]   Follow-Up: To be determined.   If you need a refill on your cardiac medications before your next appointment, please call your pharmacy.   Atherectomy An atherectomy is a surgical procedure to remove a buildup of fat, cholesterol, and other substances (plaque) from the inside of an artery. Arteries are the blood vessels that carry blood from the heart to the rest of the body. A buildup of plaque in the arteries can block blood flow. In this procedure, plaque is removed from an artery using a device at the end of a thin, flexible tube (catheter). A narrow tube of wire mesh (stent) may be placed in the artery to keep it from getting blocked again. You may have this procedure to remove plaque from the arteries of your heart (coronary arteries). You can also have this procedure to clear arteries in other parts of your body. These include the arteries that provide blood to your kidneys, legs, or brain. Tell a health care provider about:  Any allergies you have.  All medicines you are taking, including vitamins, herbs, eye drops, creams, and over-the-counter medicines.  Any problems you or family members have had with anesthetic medicines.  Any blood  disorders you have.  Any surgeries you have had.  Any medical conditions you have, including the possibility of pregnancy. What are the risks? Generally, this is a safe procedure. However, problems may occur, including:  Inability to open the blocked artery.  Artery rupture.  Heart attack.  Bleeding.  Infection.  Blockage or clotting that returns.  An allergic reaction to the medicines or dyes.  A blood clot that forms in an artery and travels to the brain  (stroke).  What happens before the procedure?  Ask your health care provider about: ? Changing or stopping your regular medicines. This is especially important if you are taking diabetes medicines or blood thinners. ? Taking medicines such as aspirin and ibuprofen. These medicines can thin your blood. Do not take these medicines before your procedure if your health care provider instructs you not to.  Follow instructions from your health care provider about eating or drinking restrictions.  Plan to have someone take you home after the procedure.  Ask your health care provider how your surgical site will be marked or identified.  You may be given antibiotic medicine to help prevent infection. What happens during the procedure?  To reduce your risk of infection: ? Your health care team will wash or sanitize their hands. ? Your skin will be washed with soap.  An IV tube will be inserted into one of your veins.  Electrodes will be placed on your chest to monitor your heart rhythm and heart activity.  Your groin area will be washed with a germ-killing solution (antiseptic). Hair may be removed from this area.  You will be given one or more of the following: ? A medicine to help you relax (sedative). ? A medicine to numb the groin area (local anesthetic).  Your health care provider will make a small incision in your groin area and identify the needed artery.  A catheter will be put into the artery and guided to the location of the plaque. Your health care provider will use X-ray images to guide the catheter to the right spot.  Dye will be injected into the artery after the catheter is in the correct position.  Your health care provider will use an instrument that is inserted through the catheter to cut away pieces of plaque. The pieces will be stored in part of the catheter so they can be removed.  A stent may be put in place to keep your artery open.  The catheter will be  removed.  The incision will be closed and covered with a certain type of bandage (pressure dressing). The procedure may vary among health care providers and hospitals. What happens after the procedure?  Your blood pressure, heart rate, breathing rate, and blood oxygen level will be monitored often until the medicines you were given have worn off.  After several hours, you will be encouraged to get up and walk around. This information is not intended to replace advice given to you by your health care provider. Make sure you discuss any questions you have with your health care provider. Document Released: 05/31/2006 Document Revised: 06/25/2015 Document Reviewed: 01/13/2014 Elsevier Interactive Patient Education  [employer].

## 2016-11-25 ENCOUNTER — Encounter: Payer: Self-pay | Admitting: Internal Medicine

## 2016-11-25 ENCOUNTER — Other Ambulatory Visit: Payer: Self-pay | Admitting: *Deleted

## 2016-11-25 DIAGNOSIS — J189 Pneumonia, unspecified organism: Secondary | ICD-10-CM | POA: Insufficient documentation

## 2016-11-25 DIAGNOSIS — Z01810 Encounter for preprocedural cardiovascular examination: Secondary | ICD-10-CM

## 2016-11-25 DIAGNOSIS — I739 Peripheral vascular disease, unspecified: Secondary | ICD-10-CM | POA: Insufficient documentation

## 2016-11-25 DIAGNOSIS — I4901 Ventricular fibrillation: Principal | ICD-10-CM

## 2016-11-25 DIAGNOSIS — I469 Cardiac arrest, cause unspecified: Secondary | ICD-10-CM

## 2016-11-25 DIAGNOSIS — E1069 Type 1 diabetes mellitus with other specified complication: Secondary | ICD-10-CM | POA: Insufficient documentation

## 2016-11-25 DIAGNOSIS — E785 Hyperlipidemia, unspecified: Secondary | ICD-10-CM | POA: Insufficient documentation

## 2016-11-25 HISTORY — DX: Pneumonia, unspecified organism: J18.9

## 2016-11-30 ENCOUNTER — Other Ambulatory Visit
Admission: RE | Admit: 2016-11-30 | Discharge: 2016-11-30 | Disposition: A | Discharge status: Home / Self Care | Payer: Managed Care, Other (non HMO) | Source: Ambulatory Visit | Attending: Internal Medicine | Admitting: Internal Medicine

## 2016-11-30 DIAGNOSIS — Z01810 Encounter for preprocedural cardiovascular examination: Secondary | ICD-10-CM | POA: Diagnosis present

## 2016-11-30 DIAGNOSIS — I4901 Ventricular fibrillation: Secondary | ICD-10-CM | POA: Diagnosis present

## 2016-11-30 DIAGNOSIS — I469 Cardiac arrest, cause unspecified: Secondary | ICD-10-CM | POA: Insufficient documentation

## 2016-11-30 LAB — CBC WITH DIFFERENTIAL/PLATELET
BASOS PCT: 1 %
Basophils Absolute: 0.1 10*3/uL (ref 0–0.1)
EOS ABS: 0.2 10*3/uL (ref 0–0.7)
Eosinophils Relative: 2 %
HCT: 36.4 % (ref 35.0–47.0)
HEMOGLOBIN: 11.9 g/dL — AB (ref 12.0–16.0)
Lymphocytes Relative: 28 %
Lymphs Abs: 2.9 10*3/uL (ref 1.0–3.6)
MCH: 27.7 pg (ref 26.0–34.0)
MCHC: 32.8 g/dL (ref 32.0–36.0)
MCV: 84.6 fL (ref 80.0–100.0)
Monocytes Absolute: 1.1 10*3/uL — ABNORMAL HIGH (ref 0.2–0.9)
Monocytes Relative: 10 %
NEUTROS PCT: 59 %
Neutro Abs: 6.1 10*3/uL (ref 1.4–6.5)
PLATELETS: 417 10*3/uL (ref 150–440)
RBC: 4.3 MIL/uL (ref 3.80–5.20)
RDW: 15.5 % — ABNORMAL HIGH (ref 11.5–14.5)
WBC: 10.3 10*3/uL (ref 3.6–11.0)

## 2016-12-02 ENCOUNTER — Encounter (HOSPITAL_COMMUNITY)
Admission: RE | Disposition: A | Discharge status: Home / Self Care | Payer: Self-pay | Source: Ambulatory Visit | Attending: Internal Medicine

## 2016-12-02 ENCOUNTER — Ambulatory Visit (HOSPITAL_COMMUNITY)
Admission: RE | Admit: 2016-12-02 | Discharge: 2016-12-03 | Disposition: A | Discharge status: Home / Self Care | Payer: Managed Care, Other (non HMO) | Source: Ambulatory Visit | Attending: Internal Medicine | Admitting: Internal Medicine

## 2016-12-02 ENCOUNTER — Encounter (HOSPITAL_COMMUNITY): Payer: Self-pay | Admitting: General Practice

## 2016-12-02 DIAGNOSIS — I97190 Other postprocedural cardiac functional disturbances following cardiac surgery: Secondary | ICD-10-CM | POA: Diagnosis not present

## 2016-12-02 DIAGNOSIS — E785 Hyperlipidemia, unspecified: Secondary | ICD-10-CM | POA: Insufficient documentation

## 2016-12-02 DIAGNOSIS — E1022 Type 1 diabetes mellitus with diabetic chronic kidney disease: Secondary | ICD-10-CM | POA: Insufficient documentation

## 2016-12-02 DIAGNOSIS — I469 Cardiac arrest, cause unspecified: Secondary | ICD-10-CM | POA: Diagnosis present

## 2016-12-02 DIAGNOSIS — I442 Atrioventricular block, complete: Secondary | ICD-10-CM | POA: Insufficient documentation

## 2016-12-02 DIAGNOSIS — I214 Non-ST elevation (NSTEMI) myocardial infarction: Secondary | ICD-10-CM

## 2016-12-02 DIAGNOSIS — Z955 Presence of coronary angioplasty implant and graft: Secondary | ICD-10-CM

## 2016-12-02 DIAGNOSIS — Y840 Cardiac catheterization as the cause of abnormal reaction of the patient, or of later complication, without mention of misadventure at the time of the procedure: Secondary | ICD-10-CM | POA: Insufficient documentation

## 2016-12-02 DIAGNOSIS — J449 Chronic obstructive pulmonary disease, unspecified: Secondary | ICD-10-CM | POA: Diagnosis not present

## 2016-12-02 DIAGNOSIS — F17210 Nicotine dependence, cigarettes, uncomplicated: Secondary | ICD-10-CM | POA: Insufficient documentation

## 2016-12-02 DIAGNOSIS — Z7982 Long term (current) use of aspirin: Secondary | ICD-10-CM | POA: Diagnosis not present

## 2016-12-02 DIAGNOSIS — I97630 Postprocedural hematoma of a circulatory system organ or structure following a cardiac catheterization: Secondary | ICD-10-CM | POA: Insufficient documentation

## 2016-12-02 DIAGNOSIS — I2584 Coronary atherosclerosis due to calcified coronary lesion: Secondary | ICD-10-CM | POA: Diagnosis not present

## 2016-12-02 DIAGNOSIS — I2510 Atherosclerotic heart disease of native coronary artery without angina pectoris: Secondary | ICD-10-CM | POA: Diagnosis present

## 2016-12-02 DIAGNOSIS — I714 Abdominal aortic aneurysm, without rupture: Secondary | ICD-10-CM | POA: Insufficient documentation

## 2016-12-02 DIAGNOSIS — I480 Paroxysmal atrial fibrillation: Secondary | ICD-10-CM

## 2016-12-02 DIAGNOSIS — I4901 Ventricular fibrillation: Secondary | ICD-10-CM | POA: Diagnosis not present

## 2016-12-02 HISTORY — PX: CORONARY ATHERECTOMY: CATH118238

## 2016-12-02 HISTORY — DX: Pure hypercholesterolemia, unspecified: E78.00

## 2016-12-02 HISTORY — DX: Personal history of urinary calculi: Z87.442

## 2016-12-02 HISTORY — PX: TEMPORARY PACEMAKER: CATH118268

## 2016-12-02 HISTORY — PX: CORONARY STENT INTERVENTION: CATH118234

## 2016-12-02 LAB — POCT ACTIVATED CLOTTING TIME
ACTIVATED CLOTTING TIME: 274 s
ACTIVATED CLOTTING TIME: 235 s
ACTIVATED CLOTTING TIME: 263 s

## 2016-12-02 LAB — GLUCOSE, CAPILLARY
GLUCOSE-CAPILLARY: 145 mg/dL — AB (ref 65–99)
GLUCOSE-CAPILLARY: 134 mg/dL — AB (ref 65–99)
Glucose-Capillary: 93 mg/dL (ref 65–99)
Glucose-Capillary: 98 mg/dL (ref 65–99)
Glucose-Capillary: 143 mg/dL — ABNORMAL HIGH (ref 65–99)

## 2016-12-02 SURGERY — CORONARY ATHERECTOMY
Anesthesia: LOCAL

## 2016-12-02 MED ORDER — ALPRAZOLAM 0.25 MG PO TABS: 0.2500 mg | ORAL_TABLET | Freq: Two times a day (BID) | ORAL | Status: DC | PRN

## 2016-12-02 MED ORDER — SODIUM CHLORIDE 0.9 % IV SOLN: 250.0000 mL | INTRAVENOUS | Status: DC | PRN

## 2016-12-02 MED ORDER — ASPIRIN EC 81 MG PO TBEC
81.0000 mg | DELAYED_RELEASE_TABLET | Freq: Every day | ORAL | Status: DC
  Administered 2016-12-03: 08:00:00 81 mg via ORAL
  Filled 2016-12-02: qty 1

## 2016-12-02 MED ORDER — HEPARIN (PORCINE) IN NACL 2-0.9 UNIT/ML-% IJ SOLN
INTRAMUSCULAR | Status: AC
  Filled 2016-12-02: qty 1500

## 2016-12-02 MED ORDER — SODIUM CHLORIDE 0.9% FLUSH: 3.0000 mL | INTRAVENOUS | Status: DC | PRN

## 2016-12-02 MED ORDER — CLOPIDOGREL BISULFATE 300 MG PO TABS
ORAL_TABLET | ORAL | Status: DC | PRN
  Administered 2016-12-02: 300 mg via ORAL

## 2016-12-02 MED ORDER — HEPARIN (PORCINE) IN NACL 2-0.9 UNIT/ML-% IJ SOLN
INTRAMUSCULAR | Status: AC | PRN
  Administered 2016-12-02: 1000 mL

## 2016-12-02 MED ORDER — DILTIAZEM HCL ER COATED BEADS 180 MG PO CP24
180.0000 mg | ORAL_CAPSULE | Freq: Every day | ORAL | Status: DC
  Administered 2016-12-03: 08:00:00 180 mg via ORAL
  Filled 2016-12-02: qty 1

## 2016-12-02 MED ORDER — SODIUM CHLORIDE 0.9% FLUSH: 3.0000 mL | Freq: Two times a day (BID) | INTRAVENOUS | Status: DC

## 2016-12-02 MED ORDER — INSULIN PUMP
Freq: Three times a day (TID) | SUBCUTANEOUS | Status: DC
  Filled 2016-12-02: qty 1

## 2016-12-02 MED ORDER — VIPERSLIDE LUBRICANT OPTIME
TOPICAL | Status: DC | PRN
  Administered 2016-12-02: 09:00:00 via SURGICAL_CAVITY

## 2016-12-02 MED ORDER — HEPARIN SODIUM (PORCINE) 1000 UNIT/ML IJ SOLN
INTRAMUSCULAR | Status: DC | PRN
  Administered 2016-12-02: 2000 [IU] via INTRAVENOUS
  Administered 2016-12-02: 3000 [IU] via INTRAVENOUS
  Administered 2016-12-02: 6000 [IU] via INTRAVENOUS

## 2016-12-02 MED ORDER — HEPARIN SODIUM (PORCINE) 1000 UNIT/ML IJ SOLN
INTRAMUSCULAR | Status: AC
  Filled 2016-12-02: qty 1

## 2016-12-02 MED ORDER — ACETAMINOPHEN 325 MG PO TABS: 650.0000 mg | ORAL_TABLET | ORAL | Status: DC | PRN

## 2016-12-02 MED ORDER — VERAPAMIL HCL 2.5 MG/ML IV SOLN
INTRAVENOUS | Status: AC
  Filled 2016-12-02: qty 2

## 2016-12-02 MED ORDER — IOPAMIDOL (ISOVUE-370) INJECTION 76%
INTRAVENOUS | Status: AC
  Filled 2016-12-02: qty 125

## 2016-12-02 MED ORDER — LIDOCAINE HCL (PF) 1 % IJ SOLN
INTRAMUSCULAR | Status: AC
  Filled 2016-12-02: qty 30

## 2016-12-02 MED ORDER — SODIUM CHLORIDE 0.9 % WEIGHT BASED INFUSION
3.0000 mL/kg/h | INTRAVENOUS | Status: DC
  Administered 2016-12-02: 3 mL/kg/h via INTRAVENOUS

## 2016-12-02 MED ORDER — CLOPIDOGREL BISULFATE 75 MG PO TABS
75.0000 mg | ORAL_TABLET | Freq: Every day | ORAL | Status: DC
  Administered 2016-12-03: 08:00:00 75 mg via ORAL
  Filled 2016-12-02: qty 1

## 2016-12-02 MED ORDER — LIDOCAINE HCL (PF) 1 % IJ SOLN
INTRAMUSCULAR | Status: DC | PRN
  Administered 2016-12-02 (×2): 2 mL

## 2016-12-02 MED ORDER — IOPAMIDOL (ISOVUE-370) INJECTION 76%
INTRAVENOUS | Status: DC | PRN
  Administered 2016-12-02: 155 mL

## 2016-12-02 MED ORDER — ADENOSINE 6 MG/2ML IV SOLN
INTRAVENOUS | Status: AC
  Filled 2016-12-02: qty 2

## 2016-12-02 MED ORDER — HYDRALAZINE HCL 20 MG/ML IJ SOLN
5.0000 mg | INTRAMUSCULAR | Status: AC | PRN
  Administered 2016-12-02: 5 mg via INTRAVENOUS
  Filled 2016-12-02: qty 1

## 2016-12-02 MED ORDER — ANGIOPLASTY BOOK
Freq: Once | Status: AC
  Administered 2016-12-02: 20:00:00
  Filled 2016-12-02: qty 1

## 2016-12-02 MED ORDER — NITROGLYCERIN IN D5W 200-5 MCG/ML-% IV SOLN
INTRAVENOUS | Status: AC | PRN
  Administered 2016-12-02: 10 ug/min via INTRAVENOUS

## 2016-12-02 MED ORDER — SODIUM CHLORIDE 0.9 % WEIGHT BASED INFUSION: 1.0000 mL/kg/h | INTRAVENOUS | Status: DC

## 2016-12-02 MED ORDER — SODIUM CHLORIDE 0.9 % IV SOLN: INTRAVENOUS | Status: AC

## 2016-12-02 MED ORDER — SODIUM CHLORIDE 0.9% FLUSH
3.0000 mL | Freq: Two times a day (BID) | INTRAVENOUS | Status: DC
  Administered 2016-12-02: 3 mL via INTRAVENOUS

## 2016-12-02 MED ORDER — NITROGLYCERIN 1 MG/10 ML FOR IR/CATH LAB
INTRA_ARTERIAL | Status: AC
  Filled 2016-12-02: qty 10

## 2016-12-02 MED ORDER — FENTANYL CITRATE (PF) 100 MCG/2ML IJ SOLN
INTRAMUSCULAR | Status: DC | PRN
  Administered 2016-12-02 (×3): 25 ug via INTRAVENOUS

## 2016-12-02 MED ORDER — ONDANSETRON HCL 4 MG/2ML IJ SOLN: 4.0000 mg | Freq: Four times a day (QID) | INTRAMUSCULAR | Status: DC | PRN

## 2016-12-02 MED ORDER — NITROGLYCERIN 0.4 MG SL SUBL: 0.4000 mg | SUBLINGUAL_TABLET | SUBLINGUAL | Status: DC | PRN

## 2016-12-02 MED ORDER — IOPAMIDOL (ISOVUE-370) INJECTION 76%
INTRAVENOUS | Status: AC
  Filled 2016-12-02: qty 100

## 2016-12-02 MED ORDER — ASPIRIN 81 MG PO CHEW: 81.0000 mg | CHEWABLE_TABLET | ORAL | Status: DC

## 2016-12-02 MED ORDER — HEART ATTACK BOUNCING BOOK
Freq: Once | Status: AC
  Administered 2016-12-02: 20:00:00
  Filled 2016-12-02: qty 1

## 2016-12-02 MED ORDER — VERAPAMIL HCL 2.5 MG/ML IV SOLN
INTRAVENOUS | Status: DC | PRN
  Administered 2016-12-02: 10 mL via INTRA_ARTERIAL

## 2016-12-02 MED ORDER — MIDAZOLAM HCL 2 MG/2ML IJ SOLN
INTRAMUSCULAR | Status: DC | PRN
  Administered 2016-12-02: 0.5 mg via INTRAVENOUS
  Administered 2016-12-02: 1 mg via INTRAVENOUS

## 2016-12-02 MED ORDER — ENSURE ENLIVE PO LIQD
237.0000 mL | Freq: Two times a day (BID) | ORAL | Status: DC
  Filled 2016-12-02 (×5): qty 237

## 2016-12-02 MED ORDER — HEPARIN SODIUM (PORCINE) 5000 UNIT/ML IJ SOLN
5000.0000 [IU] | Freq: Three times a day (TID) | INTRAMUSCULAR | Status: DC
  Administered 2016-12-03: 5000 [IU] via SUBCUTANEOUS
  Filled 2016-12-02: qty 1

## 2016-12-02 MED ORDER — MIDAZOLAM HCL 2 MG/2ML IJ SOLN
INTRAMUSCULAR | Status: AC
  Filled 2016-12-02: qty 2

## 2016-12-02 MED ORDER — CLOPIDOGREL BISULFATE 300 MG PO TABS
ORAL_TABLET | ORAL | Status: AC
  Filled 2016-12-02: qty 1

## 2016-12-02 MED ORDER — FENTANYL CITRATE (PF) 100 MCG/2ML IJ SOLN
INTRAMUSCULAR | Status: AC
  Filled 2016-12-02: qty 2

## 2016-12-02 MED ORDER — NITROGLYCERIN 1 MG/10 ML FOR IR/CATH LAB
INTRA_ARTERIAL | Status: DC | PRN
Start: 2016-12-02 — End: 2016-12-02
  Administered 2016-12-02 (×3): 200 ug via INTRACORONARY

## 2016-12-02 MED ORDER — ATORVASTATIN CALCIUM 20 MG PO TABS
20.0000 mg | ORAL_TABLET | Freq: Every day | ORAL | Status: DC
  Administered 2016-12-02: 20 mg via ORAL
  Filled 2016-12-02: qty 1

## 2016-12-02 SURGICAL SUPPLY — 27 items
BALLN SAPPHIRE 2.5X12 (BALLOONS) ×2
BALLN SAPPHIRE ~~LOC~~ 3.25X15 (BALLOONS) ×2 IMPLANT
BALLN SPRINT LEG OTW 1.25X6 (BALLOONS) ×2
BALLOON SAPPHIRE 2.5X12 (BALLOONS) ×1 IMPLANT
BALLOON SPRINT LEG OTW 1.25X6 (BALLOONS) ×1 IMPLANT
CATH LAUNCHER 6FR AL.75 (CATHETERS) ×2 IMPLANT
CATH S G BIP PACING (SET/KITS/TRAYS/PACK) ×2 IMPLANT
COVER PRB 48X5XTLSCP FOLD TPE (BAG) ×2 IMPLANT
COVER PROBE 5X48 (BAG) ×2
CROWN DIAMONDBACK CLASSIC 1.25 (BURR) ×2 IMPLANT
DEVICE RAD COMP TR BAND LRG (VASCULAR PRODUCTS) ×2 IMPLANT
ELECT DEFIB PAD ADLT CADENCE (PAD) ×2 IMPLANT
GLIDESHEATH SLEND SS 6F .021 (SHEATH) ×2 IMPLANT
GUIDEWIRE INQWIRE 1.5J.035X260 (WIRE) ×1 IMPLANT
INQWIRE 1.5J .035X260CM (WIRE) ×2
KIT ENCORE 26 ADVANTAGE (KITS) ×2 IMPLANT
KIT HEART LEFT (KITS) ×2 IMPLANT
LUBRICANT VIPERSLIDE CORONARY (MISCELLANEOUS) ×2 IMPLANT
PACK CARDIAC CATHETERIZATION (CUSTOM PROCEDURE TRAY) ×2 IMPLANT
SHEATH GLIDE SLENDER 4/5FR (SHEATH) ×2 IMPLANT
SLEEVE REPOSITIONING LENGTH 30 (MISCELLANEOUS) ×2 IMPLANT
STENT SIERRA 3.00 X 33 MM (Permanent Stent) ×2 IMPLANT
TRANSDUCER W/STOPCOCK (MISCELLANEOUS) ×2 IMPLANT
TUBING CIL FLEX 10 FLL-RA (TUBING) ×2 IMPLANT
VALVE GUARDIAN II ~~LOC~~ HEMO (MISCELLANEOUS) ×2 IMPLANT
WIRE RUNTHROUGH .014X300CM (WIRE) ×2 IMPLANT
WIRE VIPER ADVANCE COR .012TIP (WIRE) ×2 IMPLANT

## 2016-12-02 NOTE — Progress Notes (Signed)
TR BAND REMOVAL  LOCATION:  right radial  DEFLATED PER PROTOCOL:  Yes.    TIME BAND OFF / DRESSING APPLIED:   1400   SITE UPON ARRIVAL:   Level 0  SITE AFTER BAND REMOVAL:  Level 0  CIRCULATION SENSATION AND MOVEMENT:  Within Normal Limits  Yes.    COMMENTS:

## 2016-12-02 NOTE — Care Management Note (Signed)
Case Management Note  Patient Details  Name: Lisa [name] MRN: [id number] Date of Birth: 04/02/1950  Subjective/Objective:  From home, s/p Successful orbital atherectomy and PCI to the mid RCA , will be on plavix.                 Action/Plan: NCM will follow for dc needs.   Expected Discharge Date:                  Expected Discharge Plan:  Home/Self Care  In-House Referral:     Discharge planning Services  CM Consult  Post Acute Care Choice:    Choice offered to:     DME Arranged:    DME Agency:     HH Arranged:    [place] Agency:     Status of Service:  Completed, signed off  If discussed at [employer] of Stay Meetings, dates discussed:    Additional Comments:  Zenon Mayo, RN 12/02/2016, 3:10 PM

## 2016-12-02 NOTE — H&P (View-Only) (Signed)
Follow-up Outpatient Visit Date: 11/23/2016  Primary Care Provider: Lenard Simmer, MD [place] [id number]  Chief Complaint: Follow-up coronary artery disease  HPI:  Lisa [name] is a 66 y.o. year-old female with history of coronary artery disease status post remote coronary intervention at [place] (the late 90s) and recent cardiac arrest in the setting of anaphylaxis with catheterization demonstrating severe 2 vessel CAD, 1 diabetes mellitus, peripheral vascular disease with AAA, right lower lobe pneumonia with mucous plugging, and spontaneous right thigh hematoma in the setting of anticoagulation, who presents for follow-up of coronary artery disease. I met Lisa [name] in September, when she presented with flank pain. She was diagnosed with a ureteral stone and possible UTI and started on ceftriaxone. She suffered anaphylaxis in the emergency department complicated by VF and subsequent complete heart block. She was taken for emergent cardiac catheterization which revealed severe 2 vessel CAD involving the LCx and RCA. With improvement of blood pressure, her complete heart block resolved. She made a good neurologic recovery and was transferred to [facility] for cardiac surgery evaluation. She was felt to be or surgical candidate and PCI was recommended. This was not performed due to spontaneous right thigh hematoma while on heparin requiring blood transfusion. She was subsequently rehospitalized in late September with pneumonia and endobronchial obstruction concerning for mucous plug versus tumor. Follow-up CT of the chest showed resolution of endobronchial lesion, consistent with a mucous plug.  Today, Lisa [name] reports that she has been feeling well. She still has some soreness in the chest wall from CPR at the time of her cardiac arrest in early September. She otherwise denies chest pain, shortness of breath, palpitations, lightheadedness, orthopnea, PND, and edema. Her right thigh  is back to normal. She has not had any bleeding elsewhere.  --------------------------------------------------------------------------------------------------  Cardiovascular History & Procedures: Cardiovascular Problems:  Coronary artery disease with VF arrest and transient complete heart block in the setting of anaphylactic shock  Risk Factors:  Known coronary artery disease, peripheral vascular disease, and type 1 diabetes mellitus,  Cath/PCI:  LHC (10/11/16): Severe two-vessel coronary artery disease including heavily calcified 95% proximal/mid LCx and sequential 90-99% mid RCA stenoses. Mild to moderate, nonobstructive disease involving the LMCA and LAD. Basal inferior hypokinesis with overall preserved LV contraction. Mildly elevated LVEDP. Peripheral vascular disease with ectasia and calcification of the infrarenal abdominal aorta and iliac arteries.  CV Surgery:  None  EP Procedures and Devices:  None  Non-Invasive Evaluation(s):  TTE (10/25/16): Normal LV size with mild to moderate LVH. LVEF 60-65% with grade 2 diastolic dysfunction. RV size and function. Normal PA pressure. Small-to-moderate pericardial effusion.  TTE (10/12/16): Normal LV size. LVEF 55-60%. Normal diastolic function. Normal RV size and function. Normal PA pressure.  Recent CV Pertinent Labs: Lab Results  Component Value Date   CHOL 81 10/13/2016   HDL 34 (L) 10/13/2016   LDLCALC 36 10/13/2016   TRIG 56 10/13/2016   CHOLHDL 2.4 10/13/2016   INR 1.07 10/15/2016   BNP 69.0 10/25/2016   K 3.8 10/30/2016   MG 1.5 (L) 10/26/2016   BUN 25 (H) 10/30/2016   CREATININE 0.70 10/30/2016    Past medical and surgical history were reviewed and updated in EPIC.  Current Meds  Medication Sig  . ALPRAZolam (XANAX) 0.25 MG tablet Take 0.25 mg by mouth daily.  Marland Kitchen aspirin EC 81 MG tablet Take 81 mg by mouth daily.  Marland Kitchen diltiazem (CARDIZEM CD) 180 MG 24 hr capsule Take 1  capsule (180 mg total) by mouth daily.  Marland Kitchen  diltiazem (CARDIZEM) 30 MG tablet One tablet every 6 hours as needed for heart rate >100  . ezetimibe-simvastatin (VYTORIN) 10-20 MG tablet Take 1 tablet by mouth daily.  . feeding supplement, GLUCERNA SHAKE, (GLUCERNA SHAKE) LIQD Take 237 mLs by mouth 3 (three) times daily between meals.  . insulin lispro (HUMALOG) 100 UNIT/ML cartridge Inject into the skin 3 (three) times daily with meals.  . nitroGLYCERIN (NITROSTAT) 0.4 MG SL tablet Place 1 tablet (0.4 mg total) under the tongue every 5 (five) minutes x 3 doses as needed for chest pain.    Allergies: Ceftriaxone; Rocephin [ceftriaxone sodium in dextrose]; and Albuterol  Social History   Social History  . Marital status: Married    Spouse name: N/A  . Number of children: N/A  . Years of education: N/A   Occupational History  . Not on file.   Social History Main Topics  . Smoking status: Former Smoker    Packs/day: 0.50    Years: 47.00    Types: Cigarettes    Start date: 01/25/1969  . Smokeless tobacco: Never Used     Comment: Second-hand exposure through her husband & father as well.  . Alcohol use No     Comment: occassional  . Drug use: No  . Sexual activity: Not Currently   Other Topics Concern  . Not on file   Social History Narrative   [facility] (10/14/16):   Patient has primarily done office work. Married for approximately 1 year. Has a dog at home but no bird exposure. No mold exposure.    Family History  Problem Relation Age of Onset  . Hypertension Mother   . Aortic aneurysm Father   . Hypertension Brother   . Lung disease Neg Hx   . Rheumatologic disease Neg Hx     Review of Systems: A 12-system review of systems was performed and was negative except as noted in the HPI.  --------------------------------------------------------------------------------------------------  Physical Exam: BP 112/62 (BP Location: Left Arm, Patient Position: Sitting, Cuff Size: Normal)   Pulse 94   Ht _0  (1.6  m)   Wt 129 lb (58.5 kg)   BMI 22.85 kg/m    General:  Well-developed, well-nourished woman, seated comfortably in the exam room. She is accompanied by her husband and daughter. HEENT: No conjunctival pallor or scleral icterus. Moist mucous membranes.  OP clear. Neck: Supple without lymphadenopathy, thyromegaly, JVD, or HJR.  Lungs: Normal work of breathing. Clear to auscultation bilaterally without wheezes or crackles. Heart: Regular rate and rhythm without murmurs, rubs, or gallops. Non-displaced PMI. Abd: Bowel sounds present. Soft, NT/ND without hepatosplenomegaly Ext: No lower extremity edema. Radial, PT, and DP pulses are 2+ bilaterally. Right thigh is soft and without bruising or hematoma. 2+ right femoral artery pulse. Skin: Warm and dry without rash.  EKG:  Normal sinus rhythm with left atrial enlargement. Otherwise, no significant abnormalities.  Lab Results  Component Value Date   WBC 21.6 (H) 10/27/2016   HGB 9.9 (L) 10/27/2016   HCT 28.8 (L) 10/27/2016   MCV 84.7 10/27/2016   PLT 579 (H) 10/27/2016    Lab Results  Component Value Date   NA 127 (L) 10/30/2016   K 3.8 10/30/2016   CL 91 (L) 10/30/2016   CO2 28 10/30/2016   BUN 25 (H) 10/30/2016   CREATININE 0.70 10/30/2016   GLUCOSE 258 (H) 10/30/2016   ALT 41 10/26/2016    Lab Results  Component Value Date   CHOL 81 10/13/2016   HDL 34 (L) 10/13/2016   LDLCALC 36 10/13/2016   TRIG 56 10/13/2016   CHOLHDL 2.4 10/13/2016    --------------------------------------------------------------------------------------------------  ASSESSMENT AND PLAN: Coronary artery disease without angina and recent VF arrest Cardiac arrest occurred in the setting of anaphylactic shock, likely representing supply-demand mismatch with severe two-vessel coronary artery disease. Though she does not report any angina, her CAD and recent cardiac arrest certainly place her at high risk for future cardiac events. She was evaluated by  cardiac surgery during her hospitalization in September; PCI was recommended over CABG. I have reviewed the catheterization films from September with Lisa [name] and her family. She has dense calcification involving the mid RCA and proximal LCx. We have agreed to proceed with PCI of the RCA +/- LCx. We will need to use atherectomy with temporary transvenous pacemaker placement. I favor beginning with the right coronary artery. Depending on contrast use and patient's condition, we will consider treating the LCx at that time versus as a staged procedure.  I have advised Lisa [name] that the heavy calcification and eccentricity of her lesions increases the risk for complication including dissection and coronary perforation.  Given her recent spontaneous right thigh hematoma, I would like to challenge her with dual antiplatelet therapy before the PCI.  We will have her start clopidogrel today, checking a CBC today and again in 1 week.  If her hemoglobin is stable, we will proceed with PCI next Friday (12/02/16).  Pneumonia Endobronchial lesion with postobstructive pneumonia has resolved.  I appreciate input from pulmonary.  Given that this does not appear to be a lung malignancy, we will proceed with PCI, as above.  Hyperlipidemia Continue statin therapy.  Peripheral vascular disease Aortoiliac disease noted at the time of catheterization.  Lisa [name] does not have any claudication.  No further workup at this time.  Follow-up: To be determined based on results of PCI.  Nelva Bush, MD 11/23/2016 3:07 PM

## 2016-12-02 NOTE — Progress Notes (Signed)
Pt leaves cath lab holding area in stable condition. Rt radial and Rt brachial unremarkable. Pt is A/O. Family at bedside.

## 2016-12-02 NOTE — Progress Notes (Signed)
Site area: right brachial  Site Prior to Removal:  Level 0  Pressure Applied For 10 MINUTES    Minutes Beginning at 1500  Manual:   Yes.    Patient Status During Pull:  stable  Post Pull Groin Site:  Level 0  Post Pull Instructions Given:  Yes.    Post Pull Pulses Present:  Yes.    Dressing Applied:  Yes.    Comments:

## 2016-12-02 NOTE — Interval H&P Note (Signed)
History and Physical Interval Note:  12/02/2016 6:43 AM  Lisa [name]  has presented today for percutaneous coronary intervention, with the diagnosis of coronary artery disease and cardiac arrest. The various methods of treatment have been discussed with the patient and family. After consideration of risks, benefits and other options for treatment, the patient has consented to  Procedure(s): CORONARY ATHERECTOMY - CSI (N/A) as a surgical intervention .  The patient's history has been reviewed, patient examined, no change in status, stable for surgery.  I have reviewed the patient's chart and labs.  Questions were answered to the patient's satisfaction.    Cath Lab Visit (complete for each Cath Lab visit)  Clinical Evaluation Leading to the Procedure:   ACS: No.  Non-ACS:    Anginal Classification: CCS IV (cardiac arrest and VF)  Anti-ischemic medical therapy: Minimal Therapy (1 class of medications)  Non-Invasive Test Results: No non-invasive testing performed  Prior CABG: No previous CABG  Pieter Fooks

## 2016-12-02 NOTE — Progress Notes (Addendum)
Results for Lisa [name], Lisa [name] (MRN [id number]) as of 12/02/2016 13:52  Ref. Range 12/02/2016 05:59 12/02/2016 09:39 12/02/2016 12:00  Glucose-Capillary Latest Ref Range: 65 - 99 mg/dL 145 (H) 93 98  Patient has just arrived to [facility] from cath lab.  Staff RN states that patient is wearing insulin pump at this time.  Spoke with patient about her diabetes. Was diagnosed 24 years ago.  Has been on Medtronic pump for about 4 years. Sees endocrinologist in [place], [place] for her diabetes control.   Pump settings:(per notes on 10/28/16)  Basal: 12 AM-0.525 units/hr 3 AM- 0.625 units/hr 5 AM-0.95 units/hr 8AM-0.575 units/hr 2 PM- 0.625 units/hr 8 PM- 0.725 units/hr  Total Basal=15.775 units/24 hours  Bolus: 1 unit/12 grams of CHO and 1 unit/14 grams of CHO 1 unit drops CBG approximately 50 mg/dL  Target CBG 90-120 mg/dl.  Staff RNs to check blood sugars TID, HS, and 0200. Will follow insulin pump protocol while patient is in the hospital.   Harvel Ricks RN BSN CDE Diabetes Coordinator Pager: [phone]  8am-5pm

## 2016-12-03 ENCOUNTER — Encounter: Payer: Self-pay | Admitting: Cardiology

## 2016-12-03 DIAGNOSIS — I2510 Atherosclerotic heart disease of native coronary artery without angina pectoris: Secondary | ICD-10-CM | POA: Diagnosis not present

## 2016-12-03 DIAGNOSIS — I4901 Ventricular fibrillation: Secondary | ICD-10-CM | POA: Diagnosis not present

## 2016-12-03 DIAGNOSIS — I469 Cardiac arrest, cause unspecified: Secondary | ICD-10-CM | POA: Diagnosis not present

## 2016-12-03 DIAGNOSIS — I480 Paroxysmal atrial fibrillation: Secondary | ICD-10-CM

## 2016-12-03 LAB — BASIC METABOLIC PANEL
Anion gap: 7 (ref 5–15)
CALCIUM: 8.5 mg/dL — AB (ref 8.9–10.3)
CO2: 26 mmol/L (ref 22–32)
Chloride: 101 mmol/L (ref 101–111)
Creatinine, Ser: 0.61 mg/dL (ref 0.44–1.00)
GFR calc Af Amer: >60 mL/min (ref >60)
GLUCOSE: 125 mg/dL — AB (ref 65–99)
Potassium: 3.7 mmol/L (ref 3.5–5.1)
Sodium: 134 mmol/L — ABNORMAL LOW (ref 135–145)

## 2016-12-03 LAB — CBC
HCT: 34.5 % — ABNORMAL LOW (ref 36.0–46.0)
Hemoglobin: 11 g/dL — ABNORMAL LOW (ref 12.0–15.0)
MCH: 26.8 pg (ref 26.0–34.0)
MCHC: 31.9 g/dL (ref 30.0–36.0)
MCV: 83.9 fL (ref 78.0–100.0)
PLATELETS: 378 10*3/uL (ref 150–400)
RBC: 4.11 MIL/uL (ref 3.87–5.11)
RDW: 14.7 % (ref 11.5–15.5)
WBC: 6.9 10*3/uL (ref 4.0–10.5)

## 2016-12-03 LAB — GLUCOSE, CAPILLARY: Glucose-Capillary: 123 mg/dL — ABNORMAL HIGH (ref 65–99)

## 2016-12-03 NOTE — Discharge Summary (Signed)
Discharge Summary    Patient ID: Lisa [name],  MRN: [id number], DOB/AGE: Aug 31, 1950 66 y.o.  Admit date: 12/02/2016 Discharge date: 12/03/2016  Primary Care Provider: Lenard Simmer Primary Cardiologist: Dr End  Discharge Diagnoses    Principal Problem:   Coronary artery disease Active Problems:   Cardiac arrest with ventricular fibrillation [facility])   Coronary artery disease involving native coronary artery of native heart without angina pectoris   Allergies Allergies  Allergen Reactions  . Ceftriaxone     Anaphylaxis, cardiac arrest  . Rocephin [Ceftriaxone Sodium In Dextrose] Anaphylaxis  . Albuterol Other (See Comments)    Tachycardia-->Afib. Tolerates Xopenex    Diagnostic Studies/Procedures    LHC  12/02/2016  Conclusions: 1. Severe, heavily calcified mid LAD disease. 2. Successful orbital atherectomy and PCI to the mid RCA with placement of a Xience Alpine 3.0 x 33 mm drug-eluting stent. 3. Transient atrial fibrillation during procedure; patient has history of paroxysmal atrial fibrillation.  Recommendations: 1. Overnight observation. 2. Continue DAPT with aspirin and clopidogrel for at least 6-12 months. 3. Defer adding anticoagulation for paroxysmal atrial fibrillation at this time, given history of spontaneous right thigh hematoma while on heparin during recent hospitalization. 4. Follow-up in [place] in 2-4 weeks and discuss need for PCI to proximal LCx.    Diagnostic Diagram       Post-Intervention Diagram         _____________   History of Present Illness     Lisa [name] is a 66 y.o. year-old female with history of coronary artery disease status post remote coronary intervention at [place] (the late 90s) and recent cardiac arrest in the setting of anaphylaxis with catheterization demonstrating severe 2 vessel CAD, 1 diabetes mellitus, peripheral vascular disease with AAA, right lower lobe pneumonia with mucous plugging, and spontaneous  right thigh hematoma in the setting of anticoagulation, who presents for follow-up of coronary artery disease. Lisa [name] in September, presented with flank pain. She was diagnosed with a ureteral stone and possible UTI and started on ceftriaxone. She suffered anaphylaxis in the emergency department complicated by VF and subsequent complete heart block. She was taken for emergent cardiac catheterization which revealed severe 2 vessel CAD involving the LCx and RCA. With improvement of blood pressure, her complete heart block resolved. She made a good neurologic recovery and was transferred to [facility] for cardiac surgery evaluation. She was felt to be or surgical candidate and PCI was recommended. This was not performed due to spontaneous right thigh hematoma while on heparin requiring blood transfusion. She was subsequently rehospitalized in late September with pneumonia and endobronchial obstruction concerning for mucous plug versus tumor. Follow-up CT of the chest showed resolution of endobronchial lesion, consistent with a mucous plug.  On 10/24, Lisa [name] reported that she has been feeling well. She still had some soreness in the chest wall from CPR at the time of her cardiac arrest in early September. She otherwise denies chest pain, shortness of breath, palpitations, lightheadedness, orthopnea, PND, and edema. Her right thigh is back to normal. She has not had any bleeding elsewhere.  Lisa [name] has dense calcification involving the mid RCA and proximal LCx. We have agreed to proceed with PCI of the RCA +/- LCx. We will need to use atherectomy with temporary transvenous pacemaker placement. Dr. Saunders Revel favors beginning with the right coronary artery. Depending on contrast use and patient's condition, we will consider treating the LCx at that time versus as a staged procedure.  Lisa [name] has been advised that the heavy calcification and eccentricity of her lesions increases the risk for complication including  dissection and coronary perforation.  Given her recent spontaneous right thigh hematoma, she was challenged with dual antiplatelet therapy before the PCI.    Hospital Course     Consultants: None  Lisa [name] was brought in for the planned intervention on 12/02/16. She had Successful orbital atherectomy and PCI to the mid RCA with placement of a Xience Alpine 3.0 x 33 mm drug-eluting stent. She was noted to have transient atrial fibrillation during procedure; patient has history of paroxysmal atrial fibrillation. She was observed overnight and has done well. Her vital signs are stable and she initially had some PAC's on arrival to [facility], but then has maintained sinus rhythm in the 90's overnight. She is on extended release cardizem for rate control and has immediate release to use as needed for HR >100.   She will continued on DAPT with aspirin and clopidogrel for at least 6-12 months. Anticoagulation for PAF has been deferred at this time, given her history of spontaneous right thigh hematoma while on heparin during recent hospitalization.  Continue statin.   Lisa [name] continues to smoke. She is counseled on benefits of cessation and she says that she is currently working on cessation. She declines pharmacologic assistance.   Right wrist cath site is without hematoma.  Patient has been seen by Dr. Debara Pickett today and deemed ready for discharge home. All follow up appointments have been scheduled. Discharge medications are listed below. _____________  Discharge Vitals Blood pressure (!) 143/84, pulse 93, temperature 97.7 F (36.5 C), temperature source Oral, resp. rate (!) 27, height 5\' 3"  (1.6 m), weight 128 lb (58.1 kg), SpO2 96 %.  Filed Weights   12/02/16 0548 12/03/16 0412  Weight: 128 lb (58.1 kg) 128 lb (58.1 kg)   Physical Exam  Constitutional: She is oriented to person, place, and time. She appears well-developed and well-nourished. No distress.  HENT:  Head: Normocephalic and atraumatic.   Neck: Normal range of motion. Neck supple. No JVD present.  Cardiovascular: Normal rate, regular rhythm and normal heart sounds.  Exam reveals no gallop and no friction rub.   No murmur heard. Pulmonary/Chest: Effort normal and breath sounds normal. No respiratory distress. She has no wheezes. She has no rales.  Abdominal: Soft. Bowel sounds are normal. She exhibits no distension.  Musculoskeletal: Normal range of motion. She exhibits no edema or deformity.  Neurological: She is alert and oriented to person, place, and time.  Skin: Skin is warm and dry.  Psychiatric: She has a normal mood and affect. Her behavior is normal. Thought content normal.  Vitals reviewed.   Labs & Radiologic Studies    CBC  Recent Labs  11/30/16 1245 12/03/16 0400  WBC 10.3 6.9  NEUTROABS 6.1  --   HGB 11.9* 11.0*  HCT 36.4 34.5*  MCV 84.6 83.9  PLT 417 983   Basic Metabolic Panel  Recent Labs  12/03/16 0400  NA 134*  K 3.7  CL 101  CO2 26  GLUCOSE 125*  BUN <5*  CREATININE 0.61  CALCIUM 8.5*   Liver Function Tests No results for input(s): AST, ALT, ALKPHOS, BILITOT, PROT, ALBUMIN in the last 72 hours. No results for input(s): LIPASE, AMYLASE in the last 72 hours. Cardiac Enzymes No results for input(s): CKTOTAL, CKMB, CKMBINDEX, TROPONINI in the last 72 hours. BNP Invalid input(s): POCBNP D-Dimer No results for input(s): DDIMER in the last  72 hours. Hemoglobin A1C No results for input(s): HGBA1C in the last 72 hours. Fasting Lipid Panel No results for input(s): CHOL, HDL, LDLCALC, TRIG, CHOLHDL, LDLDIRECT in the last 72 hours. Thyroid Function Tests No results for input(s): TSH, T4TOTAL, T3FREE, THYROIDAB in the last 72 hours.  Invalid input(s): FREET3 _____________  Ct Chest Wo Contrast  Result Date: 11/17/2016 CLINICAL DATA:  Shortness of breath. EXAM: CT CHEST WITHOUT CONTRAST TECHNIQUE: Multidetector CT imaging of the chest was performed following the standard protocol  without IV contrast. COMPARISON:  CT scan of October 25, 2016. FINDINGS: Cardiovascular: Atherosclerosis of thoracic aorta is noted without aneurysm formation. Coronary artery calcifications are noted. Normal cardiac size. No pericardial effusion. Mediastinum/Nodes: No enlarged mediastinal or axillary lymph nodes. Thyroid gland, trachea, and esophagus demonstrate no significant findings. Lungs/Pleura: No pneumothorax or pleural effusion is noted. Left lung is clear. Mild right basilar subsegmental atelectasis or scarring is noted. Upper Abdomen: No acute abnormality. Musculoskeletal: Multiple old bilateral rib fractures are noted. IMPRESSION: Coronary artery calcifications are noted suggesting coronary artery disease. Mild right basilar subsegmental atelectasis or scarring is noted. Multiple old bilateral rib fractures are noted. Aortic Atherosclerosis (ICD10-I70.0). Electronically Signed   By: Marijo Conception, M.D.   On: 11/17/2016 14:43   Disposition   Pt is being discharged home today in good condition.  Follow-up Plans & Appointments    Follow-up Information    End, Harrell Gave, MD Follow up.   Specialty:  Cardiology Why:  [place] office in [place] will call you to schedule a follow up appointment for 2-4 weeks.  Contact information: [place] [address] [phone]            Discharge Medications   Current Discharge Medication List    CONTINUE these medications which have NOT CHANGED   Details  ALPRAZolam (XANAX) 0.25 MG tablet Take 0.25 mg by mouth 2 (two) times daily as needed (for anxiety.).     aspirin EC 81 MG tablet Take 81 mg by mouth daily.    atorvastatin (LIPITOR) 40 MG tablet Take 20 mg by mouth at bedtime.    clopidogrel (PLAVIX) 75 MG tablet Take 1 tablet (75 mg total) by mouth daily. Qty: 90 tablet, Refills: 3    diltiazem (CARDIZEM CD) 180 MG 24 hr capsule Take 1 capsule (180 mg total) by mouth daily. Qty: 90  capsule, Refills: 3    diltiazem (CARDIZEM) 30 MG tablet One tablet every 6 hours as needed for heart rate >100 Qty: 30 tablet, Refills: 5    ibuprofen (ADVIL,MOTRIN) 200 MG tablet Take 400 mg by mouth daily.    Insulin Human (INSULIN PUMP) SOLN Inject into the skin. HUMALOG 100 UNITS/ML WITH INSULIN PUMP    insulin lispro (HUMALOG) 100 UNIT/ML cartridge Inject into the skin 3 (three) times daily with meals. INSULIN PUMP    feeding supplement, GLUCERNA SHAKE, (GLUCERNA SHAKE) LIQD Take 237 mLs by mouth 3 (three) times daily between meals. Qty: 30 Can, Refills: 0    nitroGLYCERIN (NITROSTAT) 0.4 MG SL tablet Place 1 tablet (0.4 mg total) under the tongue every 5 (five) minutes x 3 doses as needed for chest pain. Qty: 25 tablet, Refills: 12           Outstanding Labs/Studies   None  Duration of Discharge Encounter   Greater than 30 minutes including physician time.  Signed, Daune Perch NP 12/03/2016, 8:31 AM

## 2016-12-03 NOTE — Progress Notes (Signed)
CARDIAC REHAB PHASE I   PRE:  Rate/Rhythm: 94 nsr  BP:  Sitting: 130/68      SaO2: 98 ra  MODE:  Ambulation: 400 ft   POST:  Rate/Rhythm: 115 down to 89  BP:  Sitting: 139/81     SaO2: 98 ra 0930-1020 Patient ambulated in hallway independently. Accompanied by husband and CR RN. Steady gait noted. Denied complaints. Post ambulation patient back to room. Discharge education completed with patient an husband including activity progression, phase 2 CR, antiplatelet, diet and diabetes. Stent card previously given. Patient interested in phase 2 CR at [facility] and referral placed.  Vanesa Renier English PayneRN, BSN 12/03/2016 10:23 AM

## 2016-12-05 ENCOUNTER — Encounter (HOSPITAL_COMMUNITY): Payer: Self-pay | Admitting: Internal Medicine

## 2016-12-08 ENCOUNTER — Encounter: Payer: Self-pay | Admitting: Emergency Medicine

## 2016-12-08 ENCOUNTER — Emergency Department: Payer: Managed Care, Other (non HMO)

## 2016-12-08 ENCOUNTER — Observation Stay
Admission: EM | Admit: 2016-12-08 | Discharge: 2016-12-09 | Disposition: A | Discharge status: Home / Self Care | Payer: Managed Care, Other (non HMO) | Attending: Internal Medicine | Admitting: Internal Medicine

## 2016-12-08 ENCOUNTER — Observation Stay (HOSPITAL_BASED_OUTPATIENT_CLINIC_OR_DEPARTMENT_OTHER)
Admit: 2016-12-08 | Discharge: 2016-12-08 | Disposition: A | Discharge status: Home / Self Care | Payer: Managed Care, Other (non HMO) | Attending: Physician Assistant | Admitting: Physician Assistant

## 2016-12-08 ENCOUNTER — Other Ambulatory Visit: Payer: Self-pay

## 2016-12-08 DIAGNOSIS — I071 Rheumatic tricuspid insufficiency: Secondary | ICD-10-CM | POA: Insufficient documentation

## 2016-12-08 DIAGNOSIS — I5033 Acute on chronic diastolic (congestive) heart failure: Secondary | ICD-10-CM | POA: Diagnosis not present

## 2016-12-08 DIAGNOSIS — I25118 Atherosclerotic heart disease of native coronary artery with other forms of angina pectoris: Secondary | ICD-10-CM

## 2016-12-08 DIAGNOSIS — Z9641 Presence of insulin pump (external) (internal): Secondary | ICD-10-CM | POA: Diagnosis not present

## 2016-12-08 DIAGNOSIS — E785 Hyperlipidemia, unspecified: Secondary | ICD-10-CM | POA: Diagnosis not present

## 2016-12-08 DIAGNOSIS — Z955 Presence of coronary angioplasty implant and graft: Secondary | ICD-10-CM

## 2016-12-08 DIAGNOSIS — Z7982 Long term (current) use of aspirin: Secondary | ICD-10-CM | POA: Insufficient documentation

## 2016-12-08 DIAGNOSIS — I252 Old myocardial infarction: Secondary | ICD-10-CM | POA: Diagnosis not present

## 2016-12-08 DIAGNOSIS — I714 Abdominal aortic aneurysm, without rupture: Secondary | ICD-10-CM | POA: Insufficient documentation

## 2016-12-08 DIAGNOSIS — E7800 Pure hypercholesterolemia, unspecified: Secondary | ICD-10-CM | POA: Insufficient documentation

## 2016-12-08 DIAGNOSIS — Z87442 Personal history of urinary calculi: Secondary | ICD-10-CM | POA: Insufficient documentation

## 2016-12-08 DIAGNOSIS — I4891 Unspecified atrial fibrillation: Secondary | ICD-10-CM | POA: Insufficient documentation

## 2016-12-08 DIAGNOSIS — R000 Tachycardia, unspecified: Secondary | ICD-10-CM | POA: Insufficient documentation

## 2016-12-08 DIAGNOSIS — I4901 Ventricular fibrillation: Secondary | ICD-10-CM | POA: Diagnosis not present

## 2016-12-08 DIAGNOSIS — I313 Pericardial effusion (noninflammatory): Secondary | ICD-10-CM

## 2016-12-08 DIAGNOSIS — I480 Paroxysmal atrial fibrillation: Secondary | ICD-10-CM | POA: Diagnosis not present

## 2016-12-08 DIAGNOSIS — R079 Chest pain, unspecified: Secondary | ICD-10-CM | POA: Diagnosis not present

## 2016-12-08 DIAGNOSIS — E1151 Type 2 diabetes mellitus with diabetic peripheral angiopathy without gangrene: Secondary | ICD-10-CM | POA: Insufficient documentation

## 2016-12-08 DIAGNOSIS — I110 Hypertensive heart disease with heart failure: Secondary | ICD-10-CM | POA: Diagnosis not present

## 2016-12-08 DIAGNOSIS — I2510 Atherosclerotic heart disease of native coronary artery without angina pectoris: Secondary | ICD-10-CM | POA: Insufficient documentation

## 2016-12-08 DIAGNOSIS — Z79899 Other long term (current) drug therapy: Secondary | ICD-10-CM | POA: Insufficient documentation

## 2016-12-08 DIAGNOSIS — Z888 Allergy status to other drugs, medicaments and biological substances status: Secondary | ICD-10-CM | POA: Diagnosis not present

## 2016-12-08 DIAGNOSIS — Z7902 Long term (current) use of antithrombotics/antiplatelets: Secondary | ICD-10-CM | POA: Insufficient documentation

## 2016-12-08 DIAGNOSIS — I5030 Unspecified diastolic (congestive) heart failure: Secondary | ICD-10-CM | POA: Diagnosis not present

## 2016-12-08 DIAGNOSIS — Z87891 Personal history of nicotine dependence: Secondary | ICD-10-CM | POA: Insufficient documentation

## 2016-12-08 DIAGNOSIS — R0789 Other chest pain: Principal | ICD-10-CM | POA: Insufficient documentation

## 2016-12-08 DIAGNOSIS — Z794 Long term (current) use of insulin: Secondary | ICD-10-CM | POA: Insufficient documentation

## 2016-12-08 HISTORY — DX: Paroxysmal atrial fibrillation: I48.0

## 2016-12-08 LAB — CBC
HCT: 35.9 % (ref 35.0–47.0)
Hemoglobin: 11.9 g/dL — ABNORMAL LOW (ref 12.0–16.0)
MCH: 27.3 pg (ref 26.0–34.0)
MCHC: 33 g/dL (ref 32.0–36.0)
MCV: 82.8 fL (ref 80.0–100.0)
Platelets: 473 10*3/uL — ABNORMAL HIGH (ref 150–440)
RBC: 4.34 MIL/uL (ref 3.80–5.20)
RDW: 16 % — AB (ref 11.5–14.5)
WBC: 12.8 10*3/uL — AB (ref 3.6–11.0)

## 2016-12-08 LAB — BASIC METABOLIC PANEL
ANION GAP: 10 (ref 5–15)
BUN: 15 mg/dL (ref 6–20)
CALCIUM: 8.8 mg/dL — AB (ref 8.9–10.3)
CO2: 24 mmol/L (ref 22–32)
CREATININE: 0.65 mg/dL (ref 0.44–1.00)
Chloride: 99 mmol/L — ABNORMAL LOW (ref 101–111)
Glucose, Bld: 191 mg/dL — ABNORMAL HIGH (ref 65–99)
Potassium: 4.5 mmol/L (ref 3.5–5.1)
SODIUM: 133 mmol/L — AB (ref 135–145)

## 2016-12-08 LAB — TROPONIN I
TROPONIN I: 0.03 ng/mL — AB (ref <0.03)
TROPONIN I: 0.03 ng/mL — AB (ref <0.03)
Troponin I: 0.03 ng/mL (ref <0.03)

## 2016-12-08 LAB — GLUCOSE, CAPILLARY
GLUCOSE-CAPILLARY: 124 mg/dL — AB (ref 65–99)
Glucose-Capillary: 252 mg/dL — ABNORMAL HIGH (ref 65–99)
Glucose-Capillary: 145 mg/dL — ABNORMAL HIGH (ref 65–99)
Glucose-Capillary: 168 mg/dL — ABNORMAL HIGH (ref 65–99)

## 2016-12-08 LAB — ECHOCARDIOGRAM COMPLETE
Height: 63 in
WEIGHTICAEL: 2012.8 [oz_av]

## 2016-12-08 LAB — MAGNESIUM: MAGNESIUM: 1.6 mg/dL — AB (ref 1.7–2.4)

## 2016-12-08 LAB — FIBRIN DERIVATIVES D-DIMER (ARMC ONLY): FIBRIN DERIVATIVES D-DIMER (ARMC): 1937.24 ng{FEU}/mL — AB (ref 0.00–499.00)

## 2016-12-08 LAB — TSH: TSH: 2.051 u[IU]/mL (ref 0.350–4.500)

## 2016-12-08 MED ORDER — ALBUTEROL SULFATE (2.5 MG/3ML) 0.083% IN NEBU
2.5000 mg | INHALATION_SOLUTION | RESPIRATORY_TRACT | Status: DC | PRN
Start: 2016-12-08 — End: 2016-12-09

## 2016-12-08 MED ORDER — FUROSEMIDE 10 MG/ML IJ SOLN
40.0000 mg | Freq: Two times a day (BID) | INTRAMUSCULAR | Status: DC
  Administered 2016-12-08 – 2016-12-09 (×3): 40 mg via INTRAVENOUS
  Filled 2016-12-08 (×3): qty 4

## 2016-12-08 MED ORDER — MAGNESIUM OXIDE 400 (241.3 MG) MG PO TABS
400.0000 mg | ORAL_TABLET | Freq: Every day | ORAL | Status: DC
  Administered 2016-12-09: 400 mg via ORAL
  Filled 2016-12-08: qty 1

## 2016-12-08 MED ORDER — IBUPROFEN 400 MG PO TABS
400.0000 mg | ORAL_TABLET | Freq: Every day | ORAL | Status: DC
  Administered 2016-12-08 – 2016-12-09 (×2): 400 mg via ORAL
  Filled 2016-12-08 (×2): qty 1

## 2016-12-08 MED ORDER — FUROSEMIDE 20 MG PO TABS: 20.0000 mg | ORAL_TABLET | Freq: Every day | ORAL | 0 refills | Status: DC

## 2016-12-08 MED ORDER — FUROSEMIDE 20 MG PO TABS
20.0000 mg | ORAL_TABLET | Freq: Every day | ORAL | Status: DC
  Administered 2016-12-09: 20 mg via ORAL
  Filled 2016-12-08: qty 1

## 2016-12-08 MED ORDER — MAGNESIUM OXIDE 400 (241.3 MG) MG PO TABS: 400.0000 mg | ORAL_TABLET | Freq: Every day | ORAL | 0 refills | Status: DC

## 2016-12-08 MED ORDER — CLOPIDOGREL BISULFATE 75 MG PO TABS
75.0000 mg | ORAL_TABLET | Freq: Every day | ORAL | Status: DC
  Administered 2016-12-08 – 2016-12-09 (×2): 75 mg via ORAL
  Filled 2016-12-08 (×2): qty 1

## 2016-12-08 MED ORDER — ENOXAPARIN SODIUM 40 MG/0.4ML ~~LOC~~ SOLN: 40.0000 mg | SUBCUTANEOUS | Status: DC

## 2016-12-08 MED ORDER — SODIUM CHLORIDE 0.9% FLUSH
3.0000 mL | Freq: Two times a day (BID) | INTRAVENOUS | Status: DC
  Administered 2016-12-08 – 2016-12-09 (×2): 3 mL via INTRAVENOUS

## 2016-12-08 MED ORDER — IBUPROFEN 400 MG PO TABS: 400.0000 mg | ORAL_TABLET | Freq: Four times a day (QID) | ORAL | Status: DC | PRN

## 2016-12-08 MED ORDER — MAGNESIUM SULFATE 2 GM/50ML IV SOLN
2.0000 g | Freq: Once | INTRAVENOUS | Status: AC
  Administered 2016-12-08: 2 g via INTRAVENOUS
  Filled 2016-12-08: qty 50

## 2016-12-08 MED ORDER — ACETAMINOPHEN 650 MG RE SUPP: 650.0000 mg | Freq: Four times a day (QID) | RECTAL | Status: DC | PRN

## 2016-12-08 MED ORDER — INSULIN PUMP
Freq: Three times a day (TID) | SUBCUTANEOUS | Status: DC
  Administered 2016-12-08: 21:00:00 via SUBCUTANEOUS
  Administered 2016-12-08: 7 via SUBCUTANEOUS
  Administered 2016-12-09: 5.7 via SUBCUTANEOUS
  Filled 2016-12-08: qty 1

## 2016-12-08 MED ORDER — IOPAMIDOL (ISOVUE-370) INJECTION 76%
75.0000 mL | Freq: Once | INTRAVENOUS | Status: AC | PRN
  Administered 2016-12-08: 75 mL via INTRAVENOUS

## 2016-12-08 MED ORDER — ASPIRIN EC 81 MG PO TBEC
81.0000 mg | DELAYED_RELEASE_TABLET | Freq: Every day | ORAL | Status: DC
  Administered 2016-12-09: 81 mg via ORAL
  Filled 2016-12-08: qty 1

## 2016-12-08 MED ORDER — ATORVASTATIN CALCIUM 20 MG PO TABS
20.0000 mg | ORAL_TABLET | Freq: Every day | ORAL | Status: DC
  Administered 2016-12-08: 20 mg via ORAL
  Filled 2016-12-08: qty 1

## 2016-12-08 MED ORDER — ACETAMINOPHEN 325 MG PO TABS
650.0000 mg | ORAL_TABLET | Freq: Four times a day (QID) | ORAL | Status: DC | PRN
  Administered 2016-12-09: 650 mg via ORAL
  Filled 2016-12-08: qty 2

## 2016-12-08 MED ORDER — DILTIAZEM HCL ER COATED BEADS 180 MG PO CP24
180.0000 mg | ORAL_CAPSULE | Freq: Every day | ORAL | Status: DC
  Administered 2016-12-08 – 2016-12-09 (×2): 180 mg via ORAL
  Filled 2016-12-08 (×2): qty 1

## 2016-12-08 MED ORDER — ONDANSETRON HCL 4 MG PO TABS: 4.0000 mg | ORAL_TABLET | Freq: Four times a day (QID) | ORAL | Status: DC | PRN

## 2016-12-08 MED ORDER — ALPRAZOLAM 0.25 MG PO TABS: 0.2500 mg | ORAL_TABLET | Freq: Two times a day (BID) | ORAL | Status: DC | PRN

## 2016-12-08 MED ORDER — NITROGLYCERIN 0.4 MG SL SUBL: 0.4000 mg | SUBLINGUAL_TABLET | SUBLINGUAL | Status: DC | PRN

## 2016-12-08 MED ORDER — ONDANSETRON HCL 4 MG/2ML IJ SOLN: 4.0000 mg | Freq: Four times a day (QID) | INTRAMUSCULAR | Status: DC | PRN

## 2016-12-08 NOTE — ED Notes (Signed)
ED Provider at bedside. 

## 2016-12-08 NOTE — Progress Notes (Signed)
*  PRELIMINARY RESULTS* Echocardiogram 2D Echocardiogram has been performed.  Sherrie Sport 12/08/2016, 9:24 AM

## 2016-12-08 NOTE — ED Notes (Signed)
Test: trop Critical Value: 0.03  Name of Provider Notified: Owens Shark

## 2016-12-08 NOTE — Consult Note (Signed)
Cardiology Consultation:   Patient ID: EMALIA [id number]; [id number]; [birth year]   Admit date: 12/08/2016 Date of Consult: 12/08/2016  Primary Care Provider: Lenard Simmer, MD Primary Cardiologist: [name]   Patient Profile:   [name] is a 65 y.o. female with a hx of CAD s/p remote coronary intervention at [place] in the late 1990s, recent cardiac arrest in 10/2016 in the setting of anaphylaxis with cardiac cath at that time showing 2 vessel CAD, s/p recent PCI/DES to the RCA on 12/02/2016 as detailed below, PAF not on full dose anticoagulation, DM1, PVD with AAA, right lower lobe PNA with mucous plugging, and spontaneous right thigh hematoma who is being seen today for the evaluation of chest pain at the request of [name].  History of Present Illness:   [name] was first seen by our group in 10/2015 in the [name] when she presented with flank pain. She was diagnosed with a ureteral stone and possible UTI. She was started on Rocephin and suffered anaphylaxis in the [name] complicated by VF and subsequent complete heart block. Emergent LHC showed 2 vessel CAD involving the LCx and RCA. Echo showed EF 55-60%, grossly normal wall motion, RV systolic function normal, PASP normal, no evidence of pericardial effusion. With improvement in her BP, her complete heart block resolved. She made good neurologic recovery and was transferred to [name] for evaluation of possible CABG. She was felt to not be a good surgical candidate and PCI was advised. However, PCI was not performed during that admission 2/2 spontaneous right thigh hematoma on heparin requiring pRBC. She was subsequently admitted again in 10/2015 for PNA and endobronchial obstruction concerning for mucous plug vs tumor. Repeat echo on 9/25 showed EF 60-65%, normal wall motion, Gr2DD, normal RV systolic function, PASP normal, a small to moderate sized pericardial effusion was noted without features consistent with tamponade physiology.  Follow up CT chest showed resolution of the endobronchial lesion, consistent with a mucous plug. She was seen by Dr. Saunders Revel on 10/24 for follow up and was doing well. At that time she was started on a trial of DAPT given her spontaneous thigh hematoma and tolerated this well. She underwent diagnostic LHC on 11/2 with successful orbital atherectomy and PCI to the mid RCA with placement of Xience Alpine 2.0 x 33 mm DES. She was noted to have transient Afib during the case. Anticoagulation was deferred given her history of spontaneous thigh hematoma. Staged PCI of the LCx is being considered.   Patient went to bed in her usual state of health on 11/7. She was woken up at 2 AM with severe left-sided chest pain that radiated to the left shoulder and was rated 8/10. There was some associated SOB and nausea. No associated diaphoresis, vomiting, palpitations, dizziness, presyncope, or syncope. Pain last ~ 2-3 hours before improving. She has been pain free since her arrival to [name] s/p full dose ASA at home and ibuprofen via EMS. Leading up to these events, she has been doing well following her diagnostic LHC on 11/2. She is walking daily with her husband without issues. She has not missed any doses of her DAPT. She has never had pain like this previously.   Upon the patient's arrival to [name] they were found to have stable BP in the 120s to 568L mmHg systolic, HR mildly tachycardic in the low 100s bpm with frequent PACs, temp 97.6, oxygen saturation 96% on room air, weight 125 pounds. EKG showed sinus tachycardia,  108 bpm, frequent PACs, CXR showed increased size of cardiac silhouette possibly indicating pericardial effusion vs cardiomegaly alone, small left pleural effusion with mild interstitial opacities. CTA chest in the setting of an elevated D-dimer was negative for evidence of PE though did show a new, very large pericardial effusion along with multiple rib fractures and lower sternal fracture that is healing, small  bilateral pleural effusions and aortic and coronary atherosclerosis. Labs showed troponin 0.03 x 2, D-dimer 1,937.24, SCr 0.65, K+ 4.5, Na 133, WBC 12.8, HGB 11.9, PLT 473. Currently, asymptomatic.   Pulsus paradoxus of 7 mmHg by cardiology.   Past Medical History:  Diagnosis Date  . CAD (coronary artery disease)    a. 1999 PTCA @ [name] 10/11/2016: LM 20%, oLAD 40%, p-mLAD 40%, dLAD 40%, p-mLCx 95%, pRCA 30%, EF 55-65%; c. 10/2016 CT Surgery->rec PCI due to poor lung fxn. d. 12/02/16 DES to mid RCA  . Groin hematoma    a. 10/2016 Spont groin hematoma w/ anemia req 1u prbcs.  . High cholesterol   . History of kidney stones   . Mucus plugging of bronchi    a. 10/2016 RLL Bronchus obstruction->bronchus vs mass.  . NSTEMI (non-ST elevated myocardial infarction) ([place]) 10/11/2016  . Pericardial effusion    a. 10/2016 Echo: EF 60-65%, no rwma, Gr2 DD, small to mod pericardial eff. No tamponade.  . Pneumonia 02/2016   "couple times before 02/2016 too" (12/02/2016)  . PVD (peripheral vascular disease) ([place])    a. Abdominal aortogram 10/11/16: Infrarenal abdominal aorta heavily calcified & ectatic with approximately 40-50% stenosis at the bifurcation. Mild diffuse disease w/ heavy calcification noted in the common & external iliac arteries bilaterally  . Type I diabetes mellitus ([place])    a. On insulin pump.(12/02/2016)  . Ventricular fibrillation ([place])    a. 10/11/2016: Felt to be secondary to demand ischemia in the setting of underlying CAD and anaphylactic reaction from IV Rocephin; b. 10/2016 Echo: EF 55-60%, ? mild inf HK; c. 10/2016 Seen by EP: No ICD indication.    Past Surgical History:  Procedure Laterality Date  . CARDIAC CATHETERIZATION  10/2016  . COLONOSCOPY    . CORONARY ANGIOPLASTY WITH STENT PLACEMENT  1999; 12/02/2016   "1 stent  + 1 stent"  . NASAL SINUS SURGERY  ~ 2009  . TONSILLECTOMY       Home Meds: Prior to Admission medications   Medication Sig Start Date [name] Date  Taking? Authorizing Provider  ALPRAZolam (XANAX) 0.25 MG tablet Take 0.25 mg by mouth 2 (two) times daily as needed (for anxiety.).    Yes [provider]  aspirin EC 81 MG tablet Take 81 mg by mouth daily.   Yes [provider]  atorvastatin (LIPITOR) 40 MG tablet Take 20 mg by mouth at bedtime.   Yes [provider]  clopidogrel (PLAVIX) 75 MG tablet Take 1 tablet (75 mg total) by mouth daily. 11/23/16  Yes [name], Harrell Gave, MD  diltiazem (CARDIZEM CD) 180 MG 24 hr capsule Take 1 capsule (180 mg total) by mouth daily. 11/02/16 01/31/17 Yes Rogelia Mire, NP  diltiazem (CARDIZEM) 30 MG tablet One tablet every 6 hours as needed for heart rate >100 Patient taking differently: Take 30 mg by mouth every 6 (six) hours as needed (for HR>100). One tablet every 6 hours as needed for heart rate >100 11/02/16  Yes Rogelia Mire, NP  ibuprofen (ADVIL,MOTRIN) 200 MG tablet Take 400 mg by mouth daily.   Yes [provider]  Insulin Human (INSULIN PUMP) SOLN Inject into the skin. HUMALOG 100 UNITS/ML WITH INSULIN PUMP   Yes [provider]  insulin lispro (HUMALOG) 100 UNIT/ML cartridge Inject into the skin 3 (three) times daily with meals. INSULIN PUMP   Yes [provider]  nitroGLYCERIN (NITROSTAT) 0.4 MG SL tablet Place 1 tablet (0.4 mg total) under the tongue every 5 (five) minutes x 3 doses as needed for chest pain. 10/21/16  Yes Bhagat, Bhavinkumar, PA  Vitamin D, Ergocalciferol, (DRISDOL) 50000 units CAPS capsule Take 50,000 Units once a week by mouth.   Yes [provider]  feeding supplement, GLUCERNA SHAKE, (GLUCERNA SHAKE) LIQD Take 237 mLs by mouth 3 (three) times daily between meals. Patient not taking: Reported on 11/24/2016 10/30/16   Epifanio Lesches, MD    Inpatient Medications: Scheduled Meds: . enoxaparin (LOVENOX) injection  40 mg Subcutaneous Q24H  . furosemide  40 mg Intravenous BID  . insulin pump    Subcutaneous TID AC, HS, 0200  . sodium chloride flush  3 mL Intravenous Q12H   Continuous Infusions:  PRN Meds: acetaminophen **OR** acetaminophen, albuterol, ibuprofen, ondansetron **OR** ondansetron (ZOFRAN) IV  Allergies:   Allergies  Allergen Reactions  . Ceftriaxone     Anaphylaxis, cardiac arrest  . Rocephin [Ceftriaxone Sodium In Dextrose] Anaphylaxis  . Albuterol Other (See Comments)    Tachycardia-->Afib. Tolerates Xopenex    Social History:   Social History   Socioeconomic History  . Marital status: Married    Spouse name: Not on file  . Number of children: Not on file  . Years of education: Not on file  . Highest education level: Not on file  Social Needs  . Financial resource strain: Not on file  . Food insecurity - worry: Not on file  . Food insecurity - inability: Not on file  . Transportation needs - medical: Not on file  . Transportation needs - non-medical: Not on file  Occupational History  . Not on file  Tobacco Use  . Smoking status: Former Smoker    Packs/day: 0.50    Years: 47.00    Pack years: 23.50    Types: Cigarettes    Start date: 01/25/1969    Last attempt to quit: 10/11/2016    Years since quitting: 0.1  . Smokeless tobacco: Never Used  . Tobacco comment: Second-hand exposure through her husband & father as well.  Substance and Sexual Activity  . Alcohol use: Yes    Alcohol/week: 6.0 oz    Types: 10 Glasses of wine per week  . Drug use: No  . Sexual activity: Not Currently  Other Topics Concern  . Not on file  Social History Narrative   [facility] (10/14/16):   Patient has primarily done office work. Married for approximately 1 year. Has a dog at home but no bird exposure. No mold exposure.     Family History:   Family History  Problem Relation Age of Onset  . Hypertension Mother   . Aortic aneurysm Father   . Hypertension Brother   . Lung disease Neg Hx   . Rheumatologic disease Neg Hx     ROS:  Review of  Systems  Constitutional: Positive for malaise/fatigue. Negative for chills, diaphoresis, fever and weight loss.  HENT: Negative for congestion.   Eyes: Negative for discharge and redness.  Respiratory: Positive for shortness of breath. Negative for cough, hemoptysis, sputum production and wheezing.   Cardiovascular: Positive for chest pain. Negative for palpitations, orthopnea,  claudication, leg swelling and PND.  Gastrointestinal: Negative for abdominal pain, blood in stool, heartburn, melena, nausea and vomiting.  Genitourinary: Negative for hematuria.  Musculoskeletal: Negative for falls and myalgias.  Skin: Negative for rash.  Neurological: Negative for dizziness, tingling, tremors, sensory change, speech change, focal weakness, loss of consciousness and weakness.  Endo/Heme/Allergies: Does not bruise/bleed easily.  Psychiatric/Behavioral: Negative for substance abuse. The patient is not nervous/anxious.   All other systems reviewed and are negative.     Physical Exam/Data:   Vitals:   12/08/16 0700 12/08/16 0800 12/08/16 0840 12/08/16 0851  BP: 123/73 140/74 133/71   Pulse: 100 100 (!) 108   Resp: (!) 21 20 18    Temp:   97.6 F (36.4 C)   TempSrc:   Oral   SpO2: 97% 97% 100%   Weight:    125 lb 12.8 oz (57.1 kg)  Height:    5' 3"  (1.6 m)    Intake/Output Summary (Last 24 hours) at 12/08/2016 1018 Last data filed at 12/08/2016 0944 Gross per 24 hour  Intake 360 ml  Output 0 ml  Net 360 ml   Filed Weights   12/08/16 0319 12/08/16 0851  Weight: 125 lb (56.7 kg) 125 lb 12.8 oz (57.1 kg)   Body mass index is 22.28 kg/m.   Physical Exam: General: Well developed, well nourished, in no acute distress. Head: Normocephalic, atraumatic, sclera non-icteric, no xanthomas, nares without discharge. Neck: Negative for carotid bruits. JVD not elevated. Lungs: Diminished breath sounds bilateral bases. Breathing is unlabored. Heart: Mildly tachycardic with S1 S2. No murmurs, rubs,  or gallops appreciated. Pulsus paradoxus: 7 mmHg. Abdomen: Soft, non-tender, non-distended with normoactive bowel sounds. No hepatomegaly. No rebound/guarding. No obvious abdominal masses. Msk:  Strength and tone appear normal for age. Extremities: No clubbing or cyanosis. No edema. Distal pedal pulses are 2+ and equal bilaterally. Neuro: Alert and oriented X 3. No facial asymmetry. No focal deficit. Moves all extremities spontaneously. Psych:  Responds to questions appropriately with a normal affect.   EKG:  The EKG was personally reviewed and demonstrates: sinus tachycardia, 108 bpm, frequent PACs, PR depression noted in leads II, aVF, V4-V6, no acute st/t changes Telemetry:  Telemetry was personally reviewed and demonstrates: sinus tachycardia with frequent PACs, low 100s bpm  Weights: Filed Weights   12/08/16 0319 12/08/16 0851  Weight: 125 lb (56.7 kg) 125 lb 12.8 oz (57.1 kg)    Relevant CV Studies: LHC 10/11/2016: Coronary Findings   Diagnostic  Dominance: Right  Left Main  Vessel is large.  LM lesion 20% stenosed  LM lesion.  Left Anterior Descending  Vessel is moderate in size. There is mild the vessel.  Ost LAD lesion 40% stenosed  Ost LAD lesion.  Prox LAD to Mid LAD lesion 40% stenosed  The lesion is eccentric. The lesion is calcified.  Dist LAD lesion 40% stenosed  Dist LAD lesion.  Second Diagonal Branch  Vessel is small in size.  Third Diagonal Branch  Vessel is moderate in size.  Ramus Intermedius  Vessel is small.  Left Circumflex  Vessel is moderate in size.  Prox Cx to Mid Cx lesion 95% stenosed  The lesion is eccentric. The lesion is severely calcified.  First Obtuse Marginal Branch  Vessel is small in size.  Second Obtuse Marginal Branch  Vessel is moderate in size.  2nd Mrg lesion 25% stenosed  2nd Mrg lesion.  Third Obtuse Marginal Branch  Vessel is moderate in size.  Right Coronary Artery  Vessel is large.  Prox RCA lesion 30% stenosed    The lesion is eccentric. The lesion is severely calcified.  Mid RCA-1 lesion 90% stenosed  The lesion is focal and eccentric. The lesion is severely calcified.  Mid RCA-2 lesion 99% stenosed  The lesion is severely calcified.  Right Posterior Descending Artery  Vessel is moderate in size.  Right Posterior Atrioventricular Branch  Vessel is moderate in size.  Intervention   No interventions have been documented.  Wall Motion              Left Heart   Left Ventricle The left ventricular size is normal. The left ventricular systolic function is normal. Upper normal only elevated LV [name]-diastolic pressure (LVEDP 26-33 mmHg). The left ventricular ejection fraction is 55-65% by visual estimate. There are LV function abnormalities due to segmental dysfunction.  Aortic Valve There is no aortic valve stenosis.  Coronary Diagrams   Diagnostic Diagram        TTE 10/12/2016: Study Conclusions  - Left ventricle: The cavity size was normal. Systolic function was   normal. The estimated ejection fraction was in the range of 55%   to 60%. Wall motion was grossly normal; Unable to exclude mild   hypokinesis of the inferior wall. Left ventricular diastolic   function parameters were normal. - Left atrium: The atrium was normal in size. - Right ventricle: Systolic function was normal. - Pulmonary arteries: Systolic pressure was within the normal   range.  TTE 10/25/2016: Study Conclusions  - Left ventricle: The cavity size was normal. Wall thickness was   increased increased in a pattern of mild to moderate LVH.   Systolic function was normal. The estimated ejection fraction was   in the range of 60% to 65%. Wall motion was normal; there were no   regional wall motion abnormalities. Features are consistent with   a pseudonormal left ventricular filling pattern, with concomitant   abnormal relaxation and increased filling pressure (grade 2   diastolic dysfunction). - Right  ventricle: Systolic function was normal. - Pulmonary arteries: Systolic pressure was within the normal   range. - Pericardium, extracardiac: A small to moderate sized pericardial   effusion was identified. Features were not consistent with   tamponade physiology.  LHC 12/02/2016: Coronary Findings   Diagnostic  Dominance: Right  Right Coronary Artery  Vessel is large.  Prox RCA lesion 40% stenosed  The lesion is eccentric. The lesion is severely calcified.  Mid RCA-1 lesion 90% stenosed  The lesion is type C, focal and eccentric. The lesion is severely calcified.  Mid RCA-2 lesion 99% stenosed  The lesion is severely calcified.  Right Posterior Descending Artery  Vessel is moderate in size.  Right Posterior Atrioventricular Branch  Vessel is moderate in size.  Intervention   Mid RCA-1 lesion  Angioplasty (Also treats lesions: Mid RCA-2)  Lesion length: 30 mm. Pre-stent angioplasty was performed using a BALLOON SAPPHIRE 2.5X12. Maximum pressure: 12 atm. A STENT SIERRA 3.00 X 33 MM drug eluting stent was successfully placed. Stent strut is well apposed. Post-stent angioplasty was performed using a BALLOON SAPPHIRE [place] 3.25X15. Maximum pressure: 18 atm. The pre-interventional distal flow is normal (TIMI 3). The post-interventional distal flow is normal (TIMI 3). The intervention was successful . No complications occurred at this lesion.  Atherectomy  Orbital atherectomy was performed using a CROWN DIAMONDBACK CLASSIC 1.25. Atherectomy device crossed the lesion. The intervention was successful.  There is no residual stenosis post intervention.  Mid  RCA-2 lesion  Angioplasty (Also treats lesions: Mid RCA-1)  Lesion length: 30 mm. Pre-stent angioplasty was performed using a BALLOON SAPPHIRE 2.5X12. Maximum pressure: 12 atm. A STENT SIERRA 3.00 X 33 MM drug eluting stent was successfully placed. Stent strut is well apposed. Post-stent angioplasty was performed using a BALLOON SAPPHIRE [place]  3.25X15. Maximum pressure: 18 atm. The pre-interventional distal flow is normal (TIMI 3). The post-interventional distal flow is normal (TIMI 3). The intervention was successful . No complications occurred at this lesion.  There is no residual stenosis post intervention.  Coronary Diagrams   Diagnostic Diagram       Post-Intervention Diagram         TTE 12/08/2016 pending.  Laboratory Data:  Chemistry Recent Labs  Lab 12/03/16 0400 12/08/16 0349  NA 134* 133*  K 3.7 4.5  CL 101 99*  CO2 26 24  GLUCOSE 125* 191*  BUN <5* 15  CREATININE 0.61 0.65  CALCIUM 8.5* 8.8*  GFRNONAA >60 >60  GFRAA >60 >60  ANIONGAP 7 10    No results for input(s): PROT, ALBUMIN, AST, ALT, ALKPHOS, BILITOT in the last 168 hours. Hematology Recent Labs  Lab 12/03/16 0400 12/08/16 0318  WBC 6.9 12.8*  RBC 4.11 4.34  HGB 11.0* 11.9*  HCT 34.5* 35.9  MCV 83.9 82.8  MCH 26.8 27.3  MCHC 31.9 33.0  RDW 14.7 16.0*  PLT 378 473*   Cardiac Enzymes Recent Labs  Lab 12/08/16 0349 12/08/16 0652  TROPONINI 0.03* 0.03*   No results for input(s): TROPIPOC in the last 168 hours.  BNPNo results for input(s): BNP, PROBNP in the last 168 hours.  DDimer No results for input(s): DDIMER in the last 168 hours.  Radiology/Studies:  Dg Chest 2 View  Result Date: 12/08/2016 IMPRESSION: 1. Increased size of cardiomediastinal silhouette may indicate pericardial effusion versus cardiomegaly alone. Consider correlation with echocardiography. 2. Small left pleural effusion. Mildly increased interstitial opacities could indicate early pulmonary edema. Electronically Signed   By: Ulyses Jarred M.D.   On: 12/08/2016 03:51   Ct Angio Chest Pe W And/or Wo Contrast  Result Date: 12/08/2016 IMPRESSION: 1. New, very large pericardial effusion has accumulated in the last 20 days and puts the patient at risk for cardiac tamponade. Correlate with physical exam findings such as Beck's triad. Echocardiogram recommended in  assessing for tamponade. 2. Bilateral fractures of the second, third, fourth, and fifth ribs also with a fracture of the left sixth rib. Some of these fractures are segmental. All demonstrate subacute chronicity with callus formation. 3. There is a transverse lower sternal fracture which appears subacute and healing. This fracture is mildly displaced. 4. Small bilateral pleural effusions with scattered atelectasis in both lower lobes. 5.  Aortic Atherosclerosis (ICD10-I70.0).  Coronary atherosclerosis. 6. No filling defect is identified in the pulmonary arterial tree to suggest pulmonary embolus. These results were called by telephone at the time of interpretation on 12/08/2016 at 8:08 am to Dr. Joni Fears, who verbally acknowledged these results. Electronically Signed   By: Van Clines M.D.   On: 12/08/2016 08:13    Assessment and Plan:   1. Chest pain/elevated troponin/CAD in native coronary artery: -Resolved -Troponin 0.03 x 2, cycle third value this afternoon -So far, not consistent with NSTEMI -Has not missed any doses of DAPT -Continue DAPT with ASA and Plavix -Consider adding beta blocker   2. Pericardial effusion: -Noted on CTA chest to have a large pericardial effusion -Stable BP and pulsus paradoxus of 7 mmHg -Preliminary  report from echo showed a moderate to large circumferential pericardial effusion without tamponade physiology  -Await formal MD read -Possible pericarditis given pain and EKG, will discuss with MD -No indication for pericardiocentesis at this time -Lasix  3. HLD: -Lipitor  4. PAF: -None noted during this admission -Not on full dose anticoagulation in the setting of recent spontaneous right thigh hematoma  5. Frequent PACs: -Asymptomatic -Potassium at goal -Check magnesium with recommendation to replete to goal > 2.0 -Check TSH -Consider adding beta blocker as above  6. Pleural effusion: -Lasix   For questions or updates, please contact [place] Please consult [email or web address] for contact info under Cardiology/STEMI.   Signed, Christell Faith, PA-C [place] Pager: [phone] 12/08/2016, 10:18 AM

## 2016-12-08 NOTE — ED Provider Notes (Addendum)
[facility] Emergency Department Provider Note   First MD Initiated Contact with Patient 12/08/16 [phone]     (approximate)  I have reviewed the triage vital signs and the nursing notes.   HISTORY  Chief Complaint Chest Pain    HPI Lisa [name] is a 66 y.o. female with low-dose of chronic medical condition including CAD with recent stent placement performed by Dr. Saunders Revel on 12-2016 with plans for an additional stent to be placed in a vessel that is 94% occluded presents to the emergency department with acute onset of central chest pain at 2:00 AM this morning that was nonradiating. Patient states that she took 4 aspirins 81 mg and was given 800 mg of ibuprofen the EMS with complete resolution of pain. Patient denies any shortness of breath at present however does admit to a nonproductive cough. Patient states that she was prescribed nitroglycerin by Dr.End but was unable to locate them at the time of the discomfort.   Past Medical History:  Diagnosis Date  . CAD (coronary artery disease)    a. 1999 PTCA @ [facility]; b. NSTEMI/Cath 10/11/2016: LM 20%, oLAD 40%, p-mLAD 40%, dLAD 40%, p-mLCx 95%, pRCA 30%, EF 55-65%; c. 10/2016 CT Surgery->rec PCI due to poor lung fxn. d. 12/02/16 DES to mid RCA  . Groin hematoma    a. 10/2016 Spont groin hematoma w/ anemia req 1u prbcs.  . High cholesterol   . History of kidney stones   . Mucus plugging of bronchi    a. 10/2016 RLL Bronchus obstruction->bronchus vs mass.  . NSTEMI (non-ST elevated myocardial infarction) ([place]) 10/11/2016  . Pericardial effusion    a. 10/2016 Echo: EF 60-65%, no rwma, Gr2 DD, small to mod pericardial eff. No tamponade.  . Pneumonia 02/2016   "couple times before 02/2016 too" (12/02/2016)  . PVD (peripheral vascular disease) ([place])    a. Abdominal aortogram 10/11/16: Infrarenal abdominal aorta heavily calcified & ectatic with approximately 40-50% stenosis at the bifurcation. Mild diffuse disease w/ heavy  calcification noted in the common & external iliac arteries bilaterally  . Type I diabetes mellitus ([place])    a. On insulin pump.(12/02/2016)  . Ventricular fibrillation ([place])    a. 10/11/2016: Felt to be secondary to demand ischemia in the setting of underlying CAD and anaphylactic reaction from IV Rocephin; b. 10/2016 Echo: EF 55-60%, ? mild inf HK; c. 10/2016 Seen by EP: No ICD indication.    Patient Active Problem List   Diagnosis Date Noted  . PAF (paroxysmal atrial fibrillation) ([place])   . Coronary artery disease involving native coronary artery of native heart without angina pectoris 12/02/2016  . Pneumonia due to infectious organism 11/25/2016  . Hyperlipidemia LDL goal <70 11/25/2016  . Peripheral vascular disease ([place]) 11/25/2016  . Atrial fibrillation with rapid ventricular response ([place])   . COPD exacerbation ([place]) 10/25/2016  . Acute respiratory failure ([place])   . Coronary artery disease   . Kidney stone   . Shock liver 10/13/2016  . Hypomagnesemia 10/13/2016  . Ventricular fibrillation ([place]) 10/13/2016  . Hyponatremia 10/13/2016  . Non-ST elevation (NSTEMI) myocardial infarction ([place]) 10/12/2016  . Renal stone 10/12/2016  . Leukocytosis 10/12/2016  . AKI (acute kidney injury) ([place]) 10/12/2016  . Respiratory failure ([place]) 10/12/2016  . Cardiogenic shock ([place]) 10/12/2016  . Anaphylaxis 10/12/2016  . Left ureteral stone   . Cardiac arrest with ventricular fibrillation ([place]) 10/11/2016    Past Surgical History:  Procedure Laterality Date  . CARDIAC CATHETERIZATION  10/2016  . COLONOSCOPY    . CORONARY ANGIOPLASTY WITH STENT PLACEMENT  1999; 12/02/2016   "1 stent  + 1 stent"  . NASAL SINUS SURGERY  ~ 2009  . TONSILLECTOMY      Prior to Admission medications   Medication Sig Start Date End Date Taking? Authorizing Provider  ALPRAZolam (XANAX) 0.25 MG tablet Take 0.25 mg by mouth 2 (two) times daily as needed (for anxiety.).    Yes [provider]  aspirin EC 81  MG tablet Take 81 mg by mouth daily.   Yes [provider]  atorvastatin (LIPITOR) 40 MG tablet Take 20 mg by mouth at bedtime.   Yes [provider]  clopidogrel (PLAVIX) 75 MG tablet Take 1 tablet (75 mg total) by mouth daily. 11/23/16  Yes End, Harrell Gave, MD  diltiazem (CARDIZEM CD) 180 MG 24 hr capsule Take 1 capsule (180 mg total) by mouth daily. 11/02/16 01/31/17 Yes Rogelia Mire, NP  diltiazem (CARDIZEM) 30 MG tablet One tablet every 6 hours as needed for heart rate >100 Patient taking differently: Take 30 mg by mouth every 6 (six) hours as needed (for HR>100). One tablet every 6 hours as needed for heart rate >100 11/02/16  Yes Rogelia Mire, NP  ibuprofen (ADVIL,MOTRIN) 200 MG tablet Take 400 mg by mouth daily.   Yes [provider]  Insulin Human (INSULIN PUMP) SOLN Inject into the skin. HUMALOG 100 UNITS/ML WITH INSULIN PUMP   Yes [provider]  insulin lispro (HUMALOG) 100 UNIT/ML cartridge Inject into the skin 3 (three) times daily with meals. INSULIN PUMP   Yes [provider]  nitroGLYCERIN (NITROSTAT) 0.4 MG SL tablet Place 1 tablet (0.4 mg total) under the tongue every 5 (five) minutes x 3 doses as needed for chest pain. 10/21/16  Yes Bhagat, Bhavinkumar, PA  Vitamin D, Ergocalciferol, (DRISDOL) 50000 units CAPS capsule Take 50,000 Units once a week by mouth.   Yes [provider]  feeding supplement, GLUCERNA SHAKE, (GLUCERNA SHAKE) LIQD Take 237 mLs by mouth 3 (three) times daily between meals. Patient not taking: Reported on 11/24/2016 10/30/16   Epifanio Lesches, MD    Allergies Ceftriaxone; Rocephin [ceftriaxone sodium in dextrose]; and Albuterol  Family History  Problem Relation Age of Onset  . Hypertension Mother   . Aortic aneurysm Father   . Hypertension Brother   . Lung disease Neg Hx   . Rheumatologic disease Neg Hx     Social History Social History   Tobacco Use  . Smoking status: Former  Smoker    Packs/day: 0.50    Years: 47.00    Pack years: 23.50    Types: Cigarettes    Start date: 01/25/1969    Last attempt to quit: 10/11/2016    Years since quitting: 0.1  . Smokeless tobacco: Never Used  . Tobacco comment: Second-hand exposure through her husband & father as well.  Substance Use Topics  . Alcohol use: Yes    Alcohol/week: 6.0 oz    Types: 10 Glasses of wine per week  . Drug use: No    Review of Systems Constitutional: No fever/chills Eyes: No visual changes. ENT: No sore throat. Cardiovascular: Positive for chest pain. Respiratory: Denies shortness of breath. Gastrointestinal: No abdominal pain.  No nausea, no vomiting.  No diarrhea.  No constipation. Genitourinary: Negative for dysuria. Musculoskeletal: Negative for neck pain.  Negative for back pain. Integumentary: Negative for rash. Neurological: Negative for headaches, focal weakness or numbness.  ____________________________________________  PHYSICAL EXAM:  VITAL SIGNS: ED Triage Vitals  Enc Vitals Group     BP 12/08/16 0317 132/73     Pulse Rate 12/08/16 0330 (!) 114     Resp 12/08/16 0317 19     Temp 12/08/16 0317 98.4 F (36.9 C)     Temp Source 12/08/16 0317 Oral     SpO2 12/08/16 0317 96 %     Weight 12/08/16 0319 56.7 kg (125 lb)     Height 12/08/16 0319 1.6 m (5\' 3" )     Head Circumference --      Peak Flow --      Pain Score 12/08/16 0317 0     Pain Loc --      Pain Edu? --      Excl. in [place]? --     Constitutional: Alert and oriented. Well appearing and in no acute distress. Eyes: Conjunctivae are normal.  Head: Atraumatic.Marland Kitchen Mouth/Throat: Mucous membranes are moist.  Oropharynx non-erythematous. Neck: No stridor. Cardiovascular: Normal rate, regular rhythm. Good peripheral circulation. Grossly normal heart sounds. Respiratory: Normal respiratory effort.  No retractions. Lungs CTAB. Gastrointestinal: Soft and nontender. No distention.  Musculoskeletal: No lower extremity  tenderness nor edema. No gross deformities of extremities. Neurologic:  Normal speech and language. No gross focal neurologic deficits are appreciated.  Skin:  Skin is warm, dry and intact. No rash noted. Psychiatric: Mood and affect are normal. Speech and behavior are normal.  ____________________________________________   LABS (all labs ordered are listed, but only abnormal results are displayed)  Labs Reviewed  CBC - Abnormal; Notable for the following components:      Result Value   WBC 12.8 (*)    Hemoglobin 11.9 (*)    RDW 16.0 (*)    Platelets 473 (*)    All other components within normal limits  BASIC METABOLIC PANEL - Abnormal; Notable for the following components:   Sodium 133 (*)    Chloride 99 (*)    Glucose, Bld 191 (*)    Calcium 8.8 (*)    All other components within normal limits  TROPONIN I - Abnormal; Notable for the following components:   Troponin I 0.03 (*)    All other components within normal limits  FIBRIN DERIVATIVES D-DIMER ([facility] ONLY) - Abnormal; Notable for the following components:   Fibrin derivatives D-dimer [facility]) 9,833.82 (*)    All other components within normal limits   ____________________________________________  EKG  ED ECG REPORT I, Lengby N BROWN, the attending physician, personally viewed and interpreted this ECG.   Date: 12/08/2016  EKG Time: 3:15 AM  Rate: 108  Rhythm: Sinus tachycardia with premature atrial contractions  Axis: Normal  Intervals: Normal  ST&T Change: None  ____________________________________________  RADIOLOGY I, Effort N BROWN, personally viewed and evaluated these images (plain radiographs) as part of my medical decision making, as well as reviewing the written report by the radiologist.  Dg Chest 2 View  Result Date: 12/08/2016 CLINICAL DATA:  Chest pain EXAM: CHEST  2 VIEW COMPARISON:  Chest radiograph 10/28/2016 FINDINGS: Moderate cardiomegaly which appears worsened compared to 10/28/2016. There  is bilateral basilar atelectasis. Mildly increased interstitial opacity. No overt pulmonary edema. Small left pleural effusion. No pneumothorax or focal airspace consolidation. IMPRESSION: 1. Increased size of cardiomediastinal silhouette may indicate pericardial effusion versus cardiomegaly alone. Consider correlation with echocardiography. 2. Small left pleural effusion. Mildly increased interstitial opacities could indicate early pulmonary edema. Electronically Signed   By: Ulyses Jarred M.D.   On:  12/08/2016 03:51     Procedures   ____________________________________________   INITIAL IMPRESSION / ASSESSMENT AND PLAN / ED COURSE  As part of my medical decision making, I reviewed the following data within the electronic MEDICAL RECORD NUMBER 66 year old female with above stated history of physical exam was clinical concern for possible cardiac etiology for the patient's chest pain. EKG revealed no ST segment elevation or depression however premature atrial contractions noted. Laboratory data notable for a troponin of 0.03 will obtain repeat troponin. Elevated d-dimer most likely secondary to recent stent placement however possibility of pulmonary emboli is also likely and a such CT scan of the chest will be performed. Patient discussed with Dr. Saunders Revel who agreed with plan for hospital admission for serial cardiac enzymes and plan to perform a CT to evaluate for pulmonary emboli . Patient discussed with Dr.Sudini hospitalist on-call who will admit the patient for further evaluation and management. Clinical Course as of Dec 08 1300  Thu Dec 08, 2016  1696 Rec'd call from Radiology. Advise of CT findings of large pericardial effusion, acute  [PS]  0824 D/w Sudini to update on rads finding.  [PS]    Clinical Course User Index [PS] Carrie Mew, MD    ____________________________________________  FINAL CLINICAL IMPRESSION(S) / ED DIAGNOSES  Final diagnoses:  Chest pain, unspecified type      MEDICATIONS GIVEN DURING THIS VISIT:  Medications - No data to display   ED Discharge Orders    None       Note:  This document was prepared using Dragon voice recognition software and may include unintentional dictation errors.    Gregor Hams, MD 12/08/16 [id number]    Gregor Hams, MD 12/08/16 [phone]

## 2016-12-08 NOTE — ED Notes (Signed)
Patient transported to CT 

## 2016-12-08 NOTE — Progress Notes (Signed)
Patient arrived to 2A.  Alert and oriented, vss, NSR on telemetry box 14.  No pain at this time.  Given orientation to the room.  Patient has an insulin pump.  Paged Dr. Darvin Neighbours for orders and put in consult for Diabetes Coordinator to manage.

## 2016-12-08 NOTE — ED Triage Notes (Signed)
Pt bib [facility] d/t chest pain that woke pt from sleep approx 0200. Pt states left sided sharp stabbing CP increasing with inspiration. Pt states taking 324 aspirin prior to EMS arrival and 800 mg ibuprofen for fever 101.0 orally. Pain subsided, no pain upon arrival. 12 L with EMS showing PAC and PVC.   Pt hx MI. In spetember pt being tx for renal infection coded while receiving rocephin. Stent placement last week.

## 2016-12-08 NOTE — H&P (Signed)
[facility] NAME: Lisa [name]    MR#:  [id number]  DATE OF BIRTH:  April 05, 1950  DATE OF ADMISSION:  12/08/2016  PRIMARY CARE PHYSICIAN: Lenard Simmer, MD   REQUESTING/REFERRING PHYSICIAN: Dr. Owens Shark  CHIEF COMPLAINT:   Chief Complaint  Patient presents with  . Chest Pain    HISTORY OF PRESENT ILLNESS:  Lisa [name]  is a 66 y.o. female with a known history of CAD, HTN, DM with recent cath comes in for chest pain.  She has been feeling weak and tired over the last few days.  Some shortness of breath.  No cough or orthopnea.  Today morning she developed acute pain in her chest along with heaviness which resolved by the time she arrived to the emergency room.  No nitro given.  Presently feels back to normal other than having weakness. EKG is normal.  D-dimer was elevated and a CTA chest was ordered.  This shows a large pericardial effusion.  Mild pulmonary edema with effusions.  Also old rib fractures.  Cardiac catheterization 12/02/2016 shoed  1. Severe, heavily calcified mid LAD disease. 2. Successful orbital atherectomy and PCI to the mid RCA with placement of a Xience Alpine 3.0 x 33 mm drug-eluting stent. 3. Transient atrial fibrillation during procedure; patient has history of paroxysmal atrial fibrillation.  PAST MEDICAL HISTORY:   Past Medical History:  Diagnosis Date  . CAD (coronary artery disease)    a. 1999 PTCA @ [facility]; b. NSTEMI/Cath 10/11/2016: LM 20%, oLAD 40%, p-mLAD 40%, dLAD 40%, p-mLCx 95%, pRCA 30%, EF 55-65%; c. 10/2016 CT Surgery->rec PCI due to poor lung fxn. d. 12/02/16 DES to mid RCA  . Groin hematoma    a. 10/2016 Spont groin hematoma w/ anemia req 1u prbcs.  . High cholesterol   . History of kidney stones   . Mucus plugging of bronchi    a. 10/2016 RLL Bronchus obstruction->bronchus vs mass.  . NSTEMI (non-ST elevated myocardial infarction) ([place]) 10/11/2016  . Pericardial effusion    a. 10/2016 Echo:  EF 60-65%, no rwma, Gr2 DD, small to mod pericardial eff. No tamponade.  . Pneumonia 02/2016   "couple times before 02/2016 too" (12/02/2016)  . PVD (peripheral vascular disease) ([place])    a. Abdominal aortogram 10/11/16: Infrarenal abdominal aorta heavily calcified & ectatic with approximately 40-50% stenosis at the bifurcation. Mild diffuse disease w/ heavy calcification noted in the common & external iliac arteries bilaterally  . Type I diabetes mellitus ([place])    a. On insulin pump.(12/02/2016)  . Ventricular fibrillation ([place])    a. 10/11/2016: Felt to be secondary to demand ischemia in the setting of underlying CAD and anaphylactic reaction from IV Rocephin; b. 10/2016 Echo: EF 55-60%, ? mild inf HK; c. 10/2016 Seen by EP: No ICD indication.    PAST SURGICAL HISTORY:   Past Surgical History:  Procedure Laterality Date  . CARDIAC CATHETERIZATION  10/2016  . COLONOSCOPY    . CORONARY ANGIOPLASTY WITH STENT PLACEMENT  1999; 12/02/2016   "1 stent  + 1 stent"  . NASAL SINUS SURGERY  ~ 2009  . TONSILLECTOMY      SOCIAL HISTORY:   Social History   Tobacco Use  . Smoking status: Former Smoker    Packs/day: 0.50    Years: 47.00    Pack years: 23.50    Types: Cigarettes    Start date: 01/25/1969    Last attempt to quit: 10/11/2016    Years  since quitting: 0.1  . Smokeless tobacco: Never Used  . Tobacco comment: Second-hand exposure through her husband & father as well.  Substance Use Topics  . Alcohol use: Yes    Alcohol/week: 6.0 oz    Types: 10 Glasses of wine per week    FAMILY HISTORY:   Family History  Problem Relation Age of Onset  . Hypertension Mother   . Aortic aneurysm Father   . Hypertension Brother   . Lung disease Neg Hx   . Rheumatologic disease Neg Hx     DRUG ALLERGIES:   Allergies  Allergen Reactions  . Ceftriaxone     Anaphylaxis, cardiac arrest  . Rocephin [Ceftriaxone Sodium In Dextrose] Anaphylaxis  . Albuterol Other (See Comments)     Tachycardia-->Afib. Tolerates Xopenex    REVIEW OF SYSTEMS:   Review of Systems  Constitutional: Positive for malaise/fatigue. Negative for chills and fever.  HENT: Negative for sore throat.   Eyes: Negative for blurred vision, double vision and pain.  Respiratory: Positive for shortness of breath. Negative for cough, hemoptysis and wheezing.   Cardiovascular: Positive for chest pain. Negative for palpitations, orthopnea and leg swelling.  Gastrointestinal: Negative for abdominal pain, constipation, diarrhea, heartburn, nausea and vomiting.  Genitourinary: Negative for dysuria and hematuria.  Musculoskeletal: Negative for back pain and joint pain.  Skin: Negative for rash.  Neurological: Positive for weakness. Negative for sensory change, speech change, focal weakness and headaches.  Endo/Heme/Allergies: Does not bruise/bleed easily.  Psychiatric/Behavioral: Negative for depression. The patient is not nervous/anxious.     MEDICATIONS AT HOME:   Prior to Admission medications   Medication Sig Start Date End Date Taking? Authorizing Provider  ALPRAZolam (XANAX) 0.25 MG tablet Take 0.25 mg by mouth 2 (two) times daily as needed (for anxiety.).    Yes [provider]  aspirin EC 81 MG tablet Take 81 mg by mouth daily.   Yes [provider]  atorvastatin (LIPITOR) 40 MG tablet Take 20 mg by mouth at bedtime.   Yes [provider]  clopidogrel (PLAVIX) 75 MG tablet Take 1 tablet (75 mg total) by mouth daily. 11/23/16  Yes End, Harrell Gave, MD  diltiazem (CARDIZEM CD) 180 MG 24 hr capsule Take 1 capsule (180 mg total) by mouth daily. 11/02/16 01/31/17 Yes Rogelia Mire, NP  diltiazem (CARDIZEM) 30 MG tablet One tablet every 6 hours as needed for heart rate >100 Patient taking differently: Take 30 mg by mouth every 6 (six) hours as needed (for HR>100). One tablet every 6 hours as needed for heart rate >100 11/02/16  Yes Rogelia Mire, NP  ibuprofen  (ADVIL,MOTRIN) 200 MG tablet Take 400 mg by mouth daily.   Yes [provider]  Insulin Human (INSULIN PUMP) SOLN Inject into the skin. HUMALOG 100 UNITS/ML WITH INSULIN PUMP   Yes [provider]  insulin lispro (HUMALOG) 100 UNIT/ML cartridge Inject into the skin 3 (three) times daily with meals. INSULIN PUMP   Yes [provider]  nitroGLYCERIN (NITROSTAT) 0.4 MG SL tablet Place 1 tablet (0.4 mg total) under the tongue every 5 (five) minutes x 3 doses as needed for chest pain. 10/21/16  Yes Bhagat, Bhavinkumar, PA  Vitamin D, Ergocalciferol, (DRISDOL) 50000 units CAPS capsule Take 50,000 Units once a week by mouth.   Yes [provider]  feeding supplement, GLUCERNA SHAKE, (GLUCERNA SHAKE) LIQD Take 237 mLs by mouth 3 (three) times daily between meals. Patient not taking: Reported on 11/24/2016 10/30/16   Epifanio Lesches,  MD     VITAL SIGNS:  Blood pressure 133/71, pulse (!) 108, temperature 97.6 F (36.4 C), temperature source Oral, resp. rate 18, height 5\' 3"  (1.6 m), weight 57.1 kg (125 lb 12.8 oz), SpO2 100 %.  PHYSICAL EXAMINATION:  Physical Exam  GENERAL:  66 y.o.-year-old patient lying in the bed with no acute distress.  EYES: Pupils equal, round, reactive to light and accommodation. No scleral icterus. Extraocular muscles intact.  HEENT: Head atraumatic, normocephalic. Oropharynx and nasopharynx clear. No oropharyngeal erythema, moist oral mucosa  NECK:  Supple, no jugular venous distention. No thyroid enlargement, no tenderness.  LUNGS: Normal work of breathing.  Left base crackles. CARDIOVASCULAR: S1, S2 normal. No murmurs, rubs, or gallops.  ABDOMEN: Soft, nontender, nondistended. Bowel sounds present. No organomegaly or mass.  EXTREMITIES: No pedal edema, cyanosis, or clubbing. + 2 pedal & radial pulses b/l.   NEUROLOGIC: Cranial nerves II through XII are intact. No focal Motor or sensory deficits appreciated b/l PSYCHIATRIC: The patient  is alert and oriented x 3. Good affect.  SKIN: No obvious rash, lesion, or ulcer.   LABORATORY PANEL:   CBC Recent Labs  Lab 12/08/16 0318  WBC 12.8*  HGB 11.9*  HCT 35.9  PLT 473*   ------------------------------------------------------------------------------------------------------------------  Chemistries  Recent Labs  Lab 12/08/16 0349  NA 133*  K 4.5  CL 99*  CO2 24  GLUCOSE 191*  BUN 15  CREATININE 0.65  CALCIUM 8.8*   ------------------------------------------------------------------------------------------------------------------  Cardiac Enzymes Recent Labs  Lab 12/08/16 0652  TROPONINI 0.03*   ------------------------------------------------------------------------------------------------------------------  RADIOLOGY:  Dg Chest 2 View  Result Date: 12/08/2016 CLINICAL DATA:  Chest pain EXAM: CHEST  2 VIEW COMPARISON:  Chest radiograph 10/28/2016 FINDINGS: Moderate cardiomegaly which appears worsened compared to 10/28/2016. There is bilateral basilar atelectasis. Mildly increased interstitial opacity. No overt pulmonary edema. Small left pleural effusion. No pneumothorax or focal airspace consolidation. IMPRESSION: 1. Increased size of cardiomediastinal silhouette may indicate pericardial effusion versus cardiomegaly alone. Consider correlation with echocardiography. 2. Small left pleural effusion. Mildly increased interstitial opacities could indicate early pulmonary edema. Electronically Signed   By: Ulyses Jarred M.D.   On: 12/08/2016 03:51   Ct Angio Chest Pe W And/or Wo Contrast  Result Date: 12/08/2016 CLINICAL DATA:  Left chest pain increasing with inspiration. EXAM: CT ANGIOGRAPHY CHEST WITH CONTRAST TECHNIQUE: Multidetector CT imaging of the chest was performed using the standard protocol during bolus administration of intravenous contrast. Multiplanar CT image reconstructions and MIPs were obtained to evaluate the vascular anatomy. CONTRAST:  75 cc  Isovue 370 COMPARISON:  Prior CT scan of 11/17/2016 FINDINGS: Cardiovascular: No filling defect is identified in the pulmonary arterial tree to suggest pulmonary embolus. Coronary, aortic arch, and branch vessel atherosclerotic vascular disease. Very large new pericardial effusion compared the last month. Mediastinum/Nodes: Small bilateral axillary lymph nodes. Lungs/Pleura: Small bilateral pleural effusions with passive atelectasis. The effusion is slightly greater in size on the left than the right. There is new scattered atelectasis in both lower lobes and in the lingula. Upper Abdomen: Unremarkable Musculoskeletal: Healing rib fractures of the right second, third, fourth, and fifth ribs with the second rib fracture being segmental. Healing rib fractures of the left second, third, fourth, fifth, and sixth ribs, with the is second and third rib fractures being segmental. Transverse lower sternal fracture is healing and mildly displaced. Review of the MIP images confirms the above findings. IMPRESSION: 1. New, very large pericardial effusion has accumulated in the last 20 days and puts  the patient at risk for cardiac tamponade. Correlate with physical exam findings such as Beck's triad. Echocardiogram recommended in assessing for tamponade. 2. Bilateral fractures of the second, third, fourth, and fifth ribs also with a fracture of the left sixth rib. Some of these fractures are segmental. All demonstrate subacute chronicity with callus formation. 3. There is a transverse lower sternal fracture which appears subacute and healing. This fracture is mildly displaced. 4. Small bilateral pleural effusions with scattered atelectasis in both lower lobes. 5.  Aortic Atherosclerosis (ICD10-I70.0).  Coronary atherosclerosis. 6. No filling defect is identified in the pulmonary arterial tree to suggest pulmonary embolus. These results were called by telephone at the time of interpretation on 12/08/2016 at 8:08 am to Dr.  Joni Fears, who verbally acknowledged these results. Electronically Signed   By: Van Clines M.D.   On: 12/08/2016 08:13     IMPRESSION AND PLAN:   *Chest pain most likely due to the large pericardial effusion.  No signs of tamponade.  We will admitted to telemetry floor.  Repeat troponin.  Check echocardiogram.  Discussed with cardiology Dr. Rockey Situ. Further management as per echocardiogram findings and cardiology input.  *Acute on chronic diastolic congestive heart failure, mild.  Start IV Lasix.  Continue home medications.  Monitor input and output.  *Diabetes mellitus with insulin pump.  Continue pump.  *DVT prophylaxis with Lovenox.  All the records are reviewed and case discussed with ED provider. Management plans discussed with the patient, family and they are in agreement.  CODE STATUS: FULL CODE  TOTAL TIME TAKING CARE OF THIS PATIENT: 40 minutes.   Hillary Bow R M.D on 12/08/2016 at 10:15 AM  Between 7am to 6pm - Pager - [phone]  After 6pm go to [email or web address] - password [email or web address] [facility]  Office  [phone]  CC: Primary care physician; Lenard Simmer, MD  Note: This dictation was prepared with Dragon dictation along with smaller phrase technology. Any transcriptional errors that result from this process are unintentional.

## 2016-12-08 NOTE — ED Notes (Signed)
Family at bedside. 

## 2016-12-08 NOTE — ED Notes (Signed)
Hospitalist to bedside at this time 

## 2016-12-08 NOTE — ED Notes (Signed)
EDP to bedside to update pt on plan of care.

## 2016-12-08 NOTE — Progress Notes (Signed)
Inpatient Diabetes Program Recommendations  AACE/ADA: New Consensus Statement on Inpatient Glycemic Control (2015)  Target Ranges:  Prepandial:   less than 140 mg/dL      Peak postprandial:   less than 180 mg/dL (1-2 hours)      Critically ill patients:  140 - 180 mg/dL   Lab Results  Component Value Date   GLUCAP 124 (H) 12/08/2016   HGBA1C 7.0 (H) 10/15/2016    Review of Glycemic Control  Results for Lisa, [name] (MRN [id number]) as of 12/08/2016 10:34  Ref. Range 12/08/2016 08:49  Glucose-Capillary Latest Ref Range: 65 - 99 mg/dL 124 (H)    Spoke with patient about her diabetes.  Sees Dr. Ronnald Collum for insulin pump management. No pump changes since hospital admission on 12/02/16  Pump settings:(per notes on 10/28/16)  Basal: 12 AM-0.525 units/hr 3 AM- 0.625 units/hr 5 AM-0.95 units/hr 8AM-0.575 units/hr 2 PM- 0.625 units/hr 8 PM- 0.725 units/hr  Total Basal=15.775 units/24 hours  Bolus: 1 unit/12 grams of CHO and 1 unit/14 grams of CHO 1 unit drops CBG approximately 50 mg/dL  Target CBG 90-120 mg/dl.  Staff RNs to check blood sugars TID, HS, and 0200. Will follow insulin pump protocol while patient is in the hospital.   Reviewed charting, flowsheet and pump contract with RN Amy.  Gentry Fitz, RN, BA, MHA, CDE Diabetes Coordinator Inpatient Diabetes Program  [phone] (Team Pager) [phone] ([place]) 12/08/2016 10:51 AM

## 2016-12-09 ENCOUNTER — Observation Stay (HOSPITAL_BASED_OUTPATIENT_CLINIC_OR_DEPARTMENT_OTHER)
Admit: 2016-12-09 | Discharge: 2016-12-09 | Disposition: A | Discharge status: Home / Self Care | Payer: Managed Care, Other (non HMO) | Attending: Nurse Practitioner | Admitting: Nurse Practitioner

## 2016-12-09 ENCOUNTER — Encounter: Payer: Self-pay | Admitting: Nurse Practitioner

## 2016-12-09 ENCOUNTER — Telehealth: Payer: Self-pay | Admitting: Internal Medicine

## 2016-12-09 ENCOUNTER — Other Ambulatory Visit: Payer: Self-pay | Admitting: Nurse Practitioner

## 2016-12-09 DIAGNOSIS — I3139 Other pericardial effusion (noninflammatory): Secondary | ICD-10-CM

## 2016-12-09 DIAGNOSIS — I313 Pericardial effusion (noninflammatory): Secondary | ICD-10-CM

## 2016-12-09 DIAGNOSIS — R079 Chest pain, unspecified: Secondary | ICD-10-CM

## 2016-12-09 LAB — ECHOCARDIOGRAM LIMITED
HEIGHTINCHES: 63 in
WEIGHTICAEL: 1964.8 [oz_av]

## 2016-12-09 LAB — GLUCOSE, CAPILLARY
GLUCOSE-CAPILLARY: 144 mg/dL — AB (ref 65–99)
GLUCOSE-CAPILLARY: 157 mg/dL — AB (ref 65–99)
Glucose-Capillary: 196 mg/dL — ABNORMAL HIGH (ref 65–99)

## 2016-12-09 NOTE — Progress Notes (Signed)
*  PRELIMINARY RESULTS* Echocardiogram 2D Echocardiogram has been performed.  Lisa [name] 12/09/2016, 9:48 AM

## 2016-12-09 NOTE — Progress Notes (Signed)
Discharged to home with husband.  Symptom free.  New meds explained.

## 2016-12-09 NOTE — Progress Notes (Signed)
Progress Note  Patient Name: Lisa [name] Date of Encounter: 12/09/2016  Primary Cardiologist: Andree Coss, MD   Subjective   No chest pain or sob overnight.  Eager to go home.  Limited echo pending this AM.  Inpatient Medications    Scheduled Meds: . aspirin EC  81 mg Oral Daily  . atorvastatin  20 mg Oral QHS  . clopidogrel  75 mg Oral Daily  . diltiazem  180 mg Oral Daily  . enoxaparin (LOVENOX) injection  40 mg Subcutaneous Q24H  . furosemide  40 mg Intravenous BID  . furosemide  20 mg Oral Daily  . ibuprofen  400 mg Oral Daily  . insulin pump   Subcutaneous TID AC, HS, 0200  . magnesium oxide  400 mg Oral Daily  . sodium chloride flush  3 mL Intravenous Q12H   Continuous Infusions:  PRN Meds: acetaminophen **OR** acetaminophen, albuterol, ALPRAZolam, ibuprofen, nitroGLYCERIN, ondansetron **OR** ondansetron (ZOFRAN) IV   Vital Signs    Vitals:   12/08/16 1526 12/08/16 1922 12/09/16 0357 12/09/16 0751  BP: 121/61 112/76 137/76 124/79  Pulse: 97 (!) 108 (!) 125 (!) 102  Resp: 18 18 18 18   Temp: 98.4 F (36.9 C) 98.9 F (37.2 C) 99 F (37.2 C) 97.9 F (36.6 C)  TempSrc: Oral Oral Oral Oral  SpO2: 95% 98% 97% 97%  Weight:   122 lb 12.8 oz (55.7 kg)   Height:        Intake/Output Summary (Last 24 hours) at 12/09/2016 0935 Last data filed at 12/09/2016 0403 Gross per 24 hour  Intake 650 ml  Output 800 ml  Net -150 ml   Filed Weights   12/08/16 0319 12/08/16 0851 12/09/16 0357  Weight: 125 lb (56.7 kg) 125 lb 12.8 oz (57.1 kg) 122 lb 12.8 oz (55.7 kg)    Physical Exam   GEN: Well nourished, well developed, in no acute distress.  HEENT: Grossly normal.  Neck: Supple, no JVD, carotid bruits, or masses. Cardiac: RRR, distant, no murmurs, rubs, or gallops. No clubbing, cyanosis, edema.  Radials/DP/PT 2+ and equal bilaterally.  Respiratory:  Respirations regular and unlabored, diminished breath sounds R base, otw CTA. GI: Soft, nontender, nondistended, BS + x  4. MS: no deformity or atrophy. Skin: warm and dry, no rash. Neuro:  Strength and sensation are intact. Psych: AAOx3.  Normal affect.  Labs    Chemistry Recent Labs  Lab 12/03/16 0400 12/08/16 0349  NA 134* 133*  K 3.7 4.5  CL 101 99*  CO2 26 24  GLUCOSE 125* 191*  BUN <5* 15  CREATININE 0.61 0.65  CALCIUM 8.5* 8.8*  GFRNONAA >60 >60  GFRAA >60 >60  ANIONGAP 7 10     Hematology Recent Labs  Lab 12/03/16 0400 12/08/16 0318  WBC 6.9 12.8*  RBC 4.11 4.34  HGB 11.0* 11.9*  HCT 34.5* 35.9  MCV 83.9 82.8  MCH 26.8 27.3  MCHC 31.9 33.0  RDW 14.7 16.0*  PLT 378 473*    Cardiac Enzymes Recent Labs  Lab 12/08/16 0349 12/08/16 0652 12/08/16 1233  TROPONINI 0.03* 0.03* 0.03*      Radiology    Dg Chest 2 View  Result Date: 12/08/2016 CLINICAL DATA:  Chest pain EXAM: CHEST  2 VIEW COMPARISON:  Chest radiograph 10/28/2016 FINDINGS: Moderate cardiomegaly which appears worsened compared to 10/28/2016. There is bilateral basilar atelectasis. Mildly increased interstitial opacity. No overt pulmonary edema. Small left pleural effusion. No pneumothorax or focal airspace consolidation. IMPRESSION: 1. Increased  size of cardiomediastinal silhouette may indicate pericardial effusion versus cardiomegaly alone. Consider correlation with echocardiography. 2. Small left pleural effusion. Mildly increased interstitial opacities could indicate early pulmonary edema. Electronically Signed   By: Ulyses Jarred M.D.   On: 12/08/2016 03:51   Ct Angio Chest Pe W And/or Wo Contrast  Result Date: 12/08/2016 CLINICAL DATA:  Left chest pain increasing with inspiration. EXAM: CT ANGIOGRAPHY CHEST WITH CONTRAST TECHNIQUE: Multidetector CT imaging of the chest was performed using the standard protocol during bolus administration of intravenous contrast. Multiplanar CT image reconstructions and MIPs were obtained to evaluate the vascular anatomy. CONTRAST:  75 cc Isovue 370 COMPARISON:  Prior CT scan  of 11/17/2016 FINDINGS: Cardiovascular: No filling defect is identified in the pulmonary arterial tree to suggest pulmonary embolus. Coronary, aortic arch, and branch vessel atherosclerotic vascular disease. Very large new pericardial effusion compared the last month. Mediastinum/Nodes: Small bilateral axillary lymph nodes. Lungs/Pleura: Small bilateral pleural effusions with passive atelectasis. The effusion is slightly greater in size on the left than the right. There is new scattered atelectasis in both lower lobes and in the lingula. Upper Abdomen: Unremarkable Musculoskeletal: Healing rib fractures of the right second, third, fourth, and fifth ribs with the second rib fracture being segmental. Healing rib fractures of the left second, third, fourth, fifth, and sixth ribs, with the is second and third rib fractures being segmental. Transverse lower sternal fracture is healing and mildly displaced. Review of the MIP images confirms the above findings. IMPRESSION: 1. New, very large pericardial effusion has accumulated in the last 20 days and puts the patient at risk for cardiac tamponade. Correlate with physical exam findings such as Beck's triad. Echocardiogram recommended in assessing for tamponade. 2. Bilateral fractures of the second, third, fourth, and fifth ribs also with a fracture of the left sixth rib. Some of these fractures are segmental. All demonstrate subacute chronicity with callus formation. 3. There is a transverse lower sternal fracture which appears subacute and healing. This fracture is mildly displaced. 4. Small bilateral pleural effusions with scattered atelectasis in both lower lobes. 5.  Aortic Atherosclerosis (ICD10-I70.0).  Coronary atherosclerosis. 6. No filling defect is identified in the pulmonary arterial tree to suggest pulmonary embolus. These results were called by telephone at the time of interpretation on 12/08/2016 at 8:08 am to Dr. Joni Fears, who verbally acknowledged these  results. Electronically Signed   By: Van Clines M.D.   On: 12/08/2016 08:13    Telemetry    Rsr, brief episode of afib/flutter last night 23:33- rate 132 - lasted under a minute - pt asymptomatic - Personally Reviewed  Cardiac Studies   2D Echocardiogram 12/08/2016  Study Conclusions  - Left ventricle: The cavity size was normal. There was mild to   moderate concentric hypertrophy. Systolic function was normal.   The estimated ejection fraction was in the range of 60% to 65%.   Wall motion was normal; there were no regional wall motion   abnormalities. Doppler parameters are consistent with abnormal   left ventricular relaxation (grade 1 diastolic dysfunction). - Left atrium: The atrium was normal in size. - Right ventricle: Systolic function was normal. - Pericardium, extracardiac: Features were not consistent with   tamponade physiology.  Impressions:  - Circumferential pericardial effusion measuring 1.2 to 1.4 cm,   moderate to large size  Patient Profile     66 y.o. female with a complex hx including CAD w/ recent cardiac arrest in the setting of anaphylaxis (Rocephin for UTI), s/p PCI  to the mid RCA 12/02/2016, spont thigh hematoma req PRBCs 10/2016, PAF (not on [place]), DM, PVD, AAA, and h/o RLL PNA w/ mucous plugging (now resolved), who presented with chest pain and was incidentally found to have a moderate to large circumferential pericadial effusion.  Assessment & Plan    1.  Chest pain/CAD/Elevated troponin:  Pt with a h/o CAD, first noted on cath in 10/2016 with severe LCX and RCA dzs.  Not felt to be surgical candidate 2/2 lung dzs/COPD. PCI deferred 2/2 anemia in setting of spontaneous thigh bleed.  Only recently placed on ASA/Plavix followed by PCI/DES to the RCA 11/2.  Presented 11/8 with left sided chest pain that was worse with moving her arm.  Trop mildly elevated with flat trend - 0.03  0.03  0.03.  Chest pain resolved in ED.  CTA chest neg for PE but  incidental finding of large pericardial effusion.  Currently chest pain free.  No plan for further ischemic eval @ this time.  Cont asa, statin, plavix.  2.  Moderate to Large pericardial effusion:  Incidentally noted on CTA chest 11/8.  Was small to moderate on echo 9/25.  Echo confirmed 1.2 to 1.4 cm circumferential pericardial effusion yesterday.  No tamponade physiology.  Limited echo pending this AM.  No ST elevation/PR depression or pleuritic/positional chest pain symptoms to suggest pericarditis.  ? Microperforation in setting of recent PCI.  If limited echo stable, likely d/c today with plan for f/u outpt echo next week with early office f/u.  3.  PAF:  Brief episode - under a minute, last night.  Asymptomatic.  CHA2DSVASc = 3.  Not on oral anticoagulation in setting of DAPT and spont thigh hematoma in Sept.  4.  DMI:  Uses insulin pump.  5.  HL:  LDL 36 in Sept. Cont statin.  6. Tob Abuse/COPD:  Quit smoking in Sept.  No wheezing.  7.  Rib fxs: stable.  No pain.  Signed, Murray Hodgkins, NP  12/09/2016, 9:35 AM    For questions or updates, please contact   Please consult [email or web address] for contact info under Cardiology/STEMI.   Attending Note Patient seen and examined, agree with detailed note above,  Patient presentation and plan discussed on rounds.   echocardiogram reviewed independently by myself  In follow-up she reports feeling well, slept well, Overall no complaints Ambulating in the hallway without significant difficulty Walk 10 minutes last night  Echocardiogram results reviewed with her, appears to have stable pericardial effusion, though significant at least moderate to large in size if not large circumferential  On physical exam lungs clear to auscultation no JVD heart sounds regular with normal S1-S2 no murmurs appreciated, abdomen soft nontender no significant lower extremity edema  As she is hemodynamically stable, Acceptable for discharge with close  observation next week Discussed a potential plan with her, we would repeat limited echocardiogram to evaluate pericardial effusion size Seems to range from 1 up to 2 cm, pocket of 2.5 cm around the LV free wall seen both yesterday and today No hemodynamic compromise noted Etiology still unclear, unable to exclude postinflammatory changes from cardiac arrest several weeks ago.  Unable to rule out microperforation following stent placement last week  Case also discussed with Dr. Saunders Revel Greater than 50% was spent in counseling and coordination of care with patient Total encounter time 25 minutes or more   Signed: Esmond Plants  M.D., Ph.D. [facility]

## 2016-12-09 NOTE — Telephone Encounter (Signed)
TCM 11/14 10am  Dr End   Pt currently admitted

## 2016-12-09 NOTE — Progress Notes (Signed)
Results for CARLISS, PORCARO (MRN [id number]) as of 12/09/2016 11:13  Ref. Range 12/08/2016 08:49 12/08/2016 11:31 12/08/2016 17:10 12/08/2016 20:32  Glucose-Capillary Latest Ref Range: 65 - 99 mg/dL 124 (H) 252 (H) 145 (H) 168 (H)   Results for EVALYSE, STROOPE (MRN [id number]) as of 12/09/2016 11:13  Ref. Range 12/09/2016 03:59 12/09/2016 07:50  Glucose-Capillary Latest Ref Range: 65 - 99 mg/dL 196 (H) 144 (H)    Met with patient this AM.  Patient A&O and able to independently manage insulin pump.  CBGs have been stable.  Per RN, pt plans to change her set/site today.  Per RN, pt has insulin pump supplies at bedside.  No changes have been made to pt's insulin pump.  Charting/documentation needs reviewed with RN.      --Will follow patient during hospitalization--  Wyn Quaker RN, MSN, CDE Diabetes Coordinator Inpatient Glycemic Control Team Team Pager: [phone] (8a-5p)

## 2016-12-09 NOTE — Plan of Care (Signed)
Able to care for herself at home.

## 2016-12-09 NOTE — Care Management (Signed)
No discharge needs identified by members of the care team 

## 2016-12-12 NOTE — Telephone Encounter (Signed)
Patient contacted regarding discharge from [facility] on Nov 9.  Patient understands to follow up with provider Dr. Saunders Revel on 11/14 at [place] at [facility], [place]. Patient understands discharge instructions? yes Patient understands medications and regiment? yes Patient understands to bring all medications to this visit? yes

## 2016-12-13 ENCOUNTER — Other Ambulatory Visit: Payer: Self-pay | Admitting: *Deleted

## 2016-12-13 ENCOUNTER — Ambulatory Visit (INDEPENDENT_AMBULATORY_CARE_PROVIDER_SITE_OTHER): Payer: Managed Care, Other (non HMO)

## 2016-12-13 ENCOUNTER — Other Ambulatory Visit: Payer: Self-pay

## 2016-12-13 ENCOUNTER — Telehealth: Payer: Self-pay | Admitting: *Deleted

## 2016-12-13 DIAGNOSIS — I3139 Other pericardial effusion (noninflammatory): Secondary | ICD-10-CM

## 2016-12-13 DIAGNOSIS — I313 Pericardial effusion (noninflammatory): Secondary | ICD-10-CM | POA: Diagnosis not present

## 2016-12-13 NOTE — Discharge Summary (Signed)
[place] [facility] [place] NAME: Lisa [name]    MR#:  [id number]  DATE OF BIRTH:  Jun 22, 1950  DATE OF ADMISSION:  12/08/2016 ADMITTING PHYSICIAN: Hillary Bow, MD  DATE OF DISCHARGE: 12/09/2016  1:23 PM  PRIMARY CARE PHYSICIAN: Morayati, Lourdes Sledge, MD   ADMISSION DIAGNOSIS:  Chest pain, unspecified type [R07.9]  DISCHARGE DIAGNOSIS:  Active Problems:   Chest pain   SECONDARY DIAGNOSIS:   Past Medical History:  Diagnosis Date  . CAD (coronary artery disease)    a. 1999 PTCA @ [facility]; b. NSTEMI/Cath 10/11/2016: LM 20%, oLAD 40%, p-mLAD 40%, dLAD 40%, p-mLCx 95%, pRCA 30%, EF 55-65%; c. 10/2016 CT Surgery->rec PCI due to poor lung fxn. d. 12/02/16 DES to mid RCA.  . Groin hematoma    a. 10/2016 Spont groin hematoma w/ anemia req 1u prbcs.  . High cholesterol   . History of kidney stones   . Mucus plugging of bronchi    a. 10/2016 RLL Bronchus obstruction->bronchus vs mass;  b. 10/2016 CT Chest: resolution of RLL mucous plug.  . NSTEMI (non-ST elevated myocardial infarction) ([place]) 10/11/2016  . PAF (paroxysmal atrial fibrillation) ([place])    a. 12/2016 Noted during diag cath--CHA2DS2VASc = 3. No OAC in setting of need for DAPT and h/o spont Thigh Hematoma in 10/2016.  Marland Kitchen Pericardial effusion    a. 10/2016 Echo: EF 60-65%, no rwma, Gr2 DD, small to mod pericardial eff. No tamponade; b. 12/2016 Echo: EF 60-65%, no rwma, Gr1 DD, 1.2 to 1.4 cm mod to large circumferential pericardial effusion w/o tamponade.  . Pneumonia 02/2016   "couple times before 02/2016 too" (12/02/2016)  . PVD (peripheral vascular disease) ([place])    a. Abdominal aortogram 10/11/16: Infrarenal abdominal aorta heavily calcified & ectatic with approximately 40-50% stenosis at the bifurcation. Mild diffuse disease w/ heavy calcification noted in the common & external iliac arteries bilaterally  . Type I diabetes mellitus ([place])    a. On insulin pump.(12/02/2016)  . Ventricular fibrillation ([place])     a. 10/11/2016: Felt to be secondary to demand ischemia in the setting of underlying CAD and anaphylactic reaction from IV Rocephin; b. 10/2016 Echo: EF 55-60%, ? mild inf HK; c. 10/2016 Seen by EP: No ICD indication.     ADMITTING HISTORY  HISTORY OF PRESENT ILLNESS:  Lisa [name]  is a 66 y.o. female with a known history of CAD, HTN, DM with recent cath comes in for chest pain.  She has been feeling weak and tired over the last few days.  Some shortness of breath.  No cough or orthopnea.  Today morning she developed acute pain in her chest along with heaviness which resolved by the time she arrived to the emergency room.  No nitro given.  Presently feels back to normal other than having weakness. EKG is normal.  D-dimer was elevated and a CTA chest was ordered.  This shows a large pericardial effusion.  Mild pulmonary edema with effusions.  Also old rib fractures.  Cardiac catheterization 12/02/2016 shoed  1. Severe, heavily calcified mid LAD disease. 2. Successful orbital atherectomy and PCI to the mid RCA with placement of a Xience Alpine 3.0 x 33 mm drug-eluting stent. 3. Transient atrial fibrillation during procedure; patient has history of paroxysmal atrial fibrillation.     HOSPITAL COURSE:   *Chest pain, atypical.  Patient was admitted to telemetry floor.  Troponin remained normal.  No arrhythmias on telemetry.  No acute EKG changes.  Seen by  cardiology.  Did not recommend any further stress test or cardiac catheterization.  Her chest pain was thought to be likely due to her pericardial effusion.  *Pericardial effusion.  She had CT scan of the chest to rule out pulmonary embolism in the emergency room.  This did not show any PE but did raise concern for large pericardial effusion.  She did not have any signs or symptoms of cardiac tamponade.  Echocardiogram was checked which showed moderate to large pericardial effusion with no changes of [place] nod.  Patient has had chronic  pericardial effusion which seems to have worsened.  She had a repeat echocardiogram on day 2 of admission which remained unchanged.  She is being discharged home to follow-up with her cardiologist as outpatient.  *Mild acute on chronic diastolic congestive heart failure.  Started on IV Lasix and diuresed well.  She had some mild shortness of breath which has resolved.  She will be discharged home on oral Lasix.  Stable for discharge home.  CONSULTS OBTAINED:  Treatment Team:  Nelva Bush, MD Minna Merritts, MD  DRUG ALLERGIES:   Allergies  Allergen Reactions  . Ceftriaxone     Anaphylaxis, cardiac arrest  . Rocephin [Ceftriaxone Sodium In Dextrose] Anaphylaxis  . Albuterol Other (See Comments)    Tachycardia-->Afib. Tolerates Xopenex    DISCHARGE MEDICATIONS:   Discharge Medication List as of 12/09/2016  1:07 PM    START taking these medications   Details  furosemide (LASIX) 20 MG tablet Take 1 tablet (20 mg total) daily by mouth., Starting Thu 12/08/2016, Until Fri 12/08/2017, Normal    magnesium oxide (MAG-OX) 400 (241.3 Mg) MG tablet Take 1 tablet (400 mg total) daily by mouth., Starting Fri 12/09/2016, Normal      CONTINUE these medications which have NOT CHANGED   Details  ALPRAZolam (XANAX) 0.25 MG tablet Take 0.25 mg by mouth 2 (two) times daily as needed (for anxiety.). , Historical Med    aspirin EC 81 MG tablet Take 81 mg by mouth daily., Historical Med    atorvastatin (LIPITOR) 40 MG tablet Take 20 mg by mouth at bedtime., Historical Med    clopidogrel (PLAVIX) 75 MG tablet Take 1 tablet (75 mg total) by mouth daily., Starting Wed 11/23/2016, Normal    diltiazem (CARDIZEM CD) 180 MG 24 hr capsule Take 1 capsule (180 mg total) by mouth daily., Starting Wed 11/02/2016, Until Tue 01/31/2017, Normal    diltiazem (CARDIZEM) 30 MG tablet One tablet every 6 hours as needed for heart rate >100, Normal    ibuprofen (ADVIL,MOTRIN) 200 MG tablet Take 400 mg by mouth  daily., Historical Med    Insulin Human (INSULIN PUMP) SOLN Inject into the skin. HUMALOG 100 UNITS/ML WITH INSULIN PUMP, Historical Med    insulin lispro (HUMALOG) 100 UNIT/ML cartridge Inject into the skin 3 (three) times daily with meals. INSULIN PUMP, Historical Med    nitroGLYCERIN (NITROSTAT) 0.4 MG SL tablet Place 1 tablet (0.4 mg total) under the tongue every 5 (five) minutes x 3 doses as needed for chest pain., Starting Fri 10/21/2016, Normal    Vitamin D, Ergocalciferol, (DRISDOL) 50000 units CAPS capsule Take 50,000 Units once a week by mouth., Historical Med    feeding supplement, GLUCERNA SHAKE, (GLUCERNA SHAKE) LIQD Take 237 mLs by mouth 3 (three) times daily between meals., Starting Sun 10/30/2016, Normal        Today   VITAL SIGNS:  Blood pressure 124/79, pulse (!) 102, temperature 97.9 F (36.6 C), temperature  source Oral, resp. rate 18, height 5\' 3"  (1.6 m), weight 55.7 kg (122 lb 12.8 oz), SpO2 97 %.  I/O:  No intake or output data in the 24 hours ending 12/13/16 1509  PHYSICAL EXAMINATION:  Physical Exam  GENERAL:  66 y.o.-year-old patient lying in the bed with no acute distress.  LUNGS: Normal breath sounds bilaterally, no wheezing, rales,rhonchi or crepitation. No use of accessory muscles of respiration.  CARDIOVASCULAR: S1, S2 normal. No murmurs, rubs, or gallops.  ABDOMEN: Soft, non-tender, non-distended. Bowel sounds present. No organomegaly or mass.  NEUROLOGIC: Moves all 4 extremities. PSYCHIATRIC: The patient is alert and oriented x 3.  SKIN: No obvious rash, lesion, or ulcer.   DATA REVIEW:   CBC Recent Labs  Lab 12/08/16 0318  WBC 12.8*  HGB 11.9*  HCT 35.9  PLT 473*    Chemistries  Recent Labs  Lab 12/08/16 0349 12/08/16 1233  NA 133*  --   K 4.5  --   CL 99*  --   CO2 24  --   GLUCOSE 191*  --   BUN 15  --   CREATININE 0.65  --   CALCIUM 8.8*  --   MG  --  1.6*    Cardiac Enzymes Recent Labs  Lab 12/08/16 1233  TROPONINI  0.03*    Microbiology Results  Results for orders placed or performed during the hospital encounter of 10/25/16  Blood Culture (routine x 2)     Status: None   Collection Time: 10/25/16  4:34 PM  Result Value Ref Range Status   Specimen Description BLOOD LEFT ANTECUBITAL  Final   Special Requests   Final    BOTTLES DRAWN AEROBIC AND ANAEROBIC Blood Culture adequate volume   Culture NO GROWTH 5 DAYS  Final   Report Status 10/30/2016 FINAL  Final  Blood Culture (routine x 2)     Status: None   Collection Time: 10/25/16  4:34 PM  Result Value Ref Range Status   Specimen Description BLOOD RIGHT ANTECUBITAL  Final   Special Requests   Final    BOTTLES DRAWN AEROBIC AND ANAEROBIC Blood Culture adequate volume   Culture NO GROWTH 5 DAYS  Final   Report Status 10/30/2016 FINAL  Final    RADIOLOGY:  No results found.  Follow up with PCP in 1 week.  Management plans discussed with the patient, family and they are in agreement.  CODE STATUS:  Code Status History    Date Active Date Inactive Code Status Order ID Comments User Context   12/08/2016 07:18 12/09/2016 16:24 Full Code [id number]  Hillary Bow, MD ED   12/02/2016 11:54 12/03/2016 14:33 Full Code [id number]  Nelva Bush, MD Inpatient   10/25/2016 16:51 10/30/2016 17:30 Full Code [id number]  Hillary Bow, MD ED   10/13/2016 11:29 10/21/2016 18:46 Full Code [id number]  Rise Mu, PA-C Inpatient   10/11/2016 12:26 10/13/2016 10:48 Full Code [id number]  Nelva Bush, MD Inpatient   10/11/2016 11:15 10/11/2016 12:26 Full Code [id number]  Wilhelmina Mcardle, MD Inpatient      TOTAL TIME TAKING CARE OF THIS PATIENT ON DAY OF DISCHARGE: more than 30 minutes.   Hillary Bow R M.D on 12/13/2016 at 3:09 PM  Between 7am to 6pm - Pager - [phone]  After 6pm go to [email or web address] - password [email or web address] [place]  Office  [phone]  CC: Primary care physician; Lenard Simmer, MD  Note: This dictation was  prepared with Dragon dictation along with  smaller phrase technology. Any transcriptional errors that result from this process are unintentional.

## 2016-12-13 NOTE — Telephone Encounter (Signed)
Dr End ordered for patient to have limited echo prior to appointment tomorrow, 12/13/16. No openings with ultrasound tomorrow morning. Opening today at 2 pm. Patient notified and is agreeable to come in at 2 pm today for limited echo and keep appointment tomorrow with Dr End.

## 2016-12-14 ENCOUNTER — Ambulatory Visit: Payer: Managed Care, Other (non HMO) | Admitting: Internal Medicine

## 2016-12-14 ENCOUNTER — Ambulatory Visit: Payer: Self-pay | Admitting: General Surgery

## 2016-12-14 ENCOUNTER — Encounter: Payer: Self-pay | Admitting: Internal Medicine

## 2016-12-14 VITALS — BP 104/58 | HR 112 | Ht 63.0 in | Wt 127.5 lb

## 2016-12-14 DIAGNOSIS — I313 Pericardial effusion (noninflammatory): Secondary | ICD-10-CM

## 2016-12-14 DIAGNOSIS — I480 Paroxysmal atrial fibrillation: Secondary | ICD-10-CM

## 2016-12-14 DIAGNOSIS — I3139 Other pericardial effusion (noninflammatory): Secondary | ICD-10-CM

## 2016-12-14 DIAGNOSIS — R000 Tachycardia, unspecified: Secondary | ICD-10-CM | POA: Diagnosis not present

## 2016-12-14 DIAGNOSIS — I2510 Atherosclerotic heart disease of native coronary artery without angina pectoris: Secondary | ICD-10-CM | POA: Diagnosis not present

## 2016-12-14 MED ORDER — FUROSEMIDE 20 MG PO TABS: 20.0000 mg | ORAL_TABLET | Freq: Every day | ORAL | 6 refills | Status: DC | PRN

## 2016-12-14 NOTE — Addendum Note (Signed)
Addended by: Burma Ketcher A on: 12/14/2016 08:37 PM   Modules accepted: Level of Service

## 2016-12-14 NOTE — Progress Notes (Signed)
Follow-up Outpatient Visit Date: 12/14/2016  Primary Care Provider: Lenard Simmer, MD [place] [id number]  Chief Complaint: Follow-up pericardial effusion  HPI:  Lisa [name] is a 66 y.o. year-old female with history of coronary artery disease status post remote coronary intervention at [facility] (the late 90s) and recent cardiac arrest in the setting of anaphylaxis with catheterization demonstrating severe 2 vessel CAD) subsequent atherectomy and PCI to RCA), recurrent pericardial effusion, type 1 diabetes mellitus, peripheral vascular disease with AAA, right lower lobe pneumonia with mucous plugging, and spontaneous right thigh hematoma in the setting of anticoagulation, who presents for follow-up of pericardial effusion.  She underwent successful orbital atherectomy and drug-eluting stent placement to the mid RCA on 12/02/16.  She felt well after discharge and actually had noted some improvement in her energy.  However, last week (12/08/16), she awoke with sharp left-sided chest pain radiating to the back.  She presented to the [facility] ED, though the chest pain had already resolved by that time.  CTA of the chest was without evidence of pulmonary embolism but showed a large pericardial effusion.  Subsequent echo confirmed moderate to large pericardial effusion without tamponade physiology.  The patient was monitored overnight and did not demonstrate any hemodynamic compromise.  Repeat echocardiogram was stable.  Today, Lisa [name] reports that she has started to feel better.  Yesterday she felt very well, though in the mornings currently still feels a little bit sluggish.  She notes that her heart rate is typically elevated when she gets up in the morning but that it comes down quickly after taking her standing diltiazem.  She sometimes still uses as needed diltiazem as well if her heart rate is above 120 bpm.  She denies chest pain, shortness of breath, lightheadedness, and edema.  She is  trying to stay well-hydrated.  She remains on furosemide 20 mg daily.  She has not had any significant bleeding, remaining on dual antiplatelet therapy with aspirin and clopidogrel.  Of note, the patient had a brief episode of atrial fibrillation during her recent PCI.  This had resolved by the Neftali Abair of the case.  --------------------------------------------------------------------------------------------------  Cardiovascular History & Procedures: Cardiovascular Problems:  Coronary artery disease with VF arrest and transient complete heart block in the setting of anaphylactic shock  Recurrent pericardial effusion  Paroxysmal atrial fibrillation  Risk Factors:  Known coronary artery disease, peripheral vascular disease, and type 1 diabetes mellitus  Cath/PCI:  PCI (12/02/16): Successful orbital atherectomy and PCI to heavily calcified mid RCA disease with placement of a Xience Alpine 3.0 x 33 mm drug-eluting stent.  LHC (10/11/16): Severe two-vessel coronary artery disease including heavily calcified 95% proximal/mid LCx and sequential 90-99% mid RCA stenoses. Mild to moderate, nonobstructive disease involving the LMCA and LAD. Basal inferior hypokinesis with overall preserved LV contraction. Mildly elevated LVEDP. Peripheral vascular disease with ectasia and calcification of the infrarenal abdominal aorta and iliac arteries.  CV Surgery:  None  EP Procedures and Devices:  None  Non-Invasive Evaluation(s):  Limited TTE (12/13/16): Normal LV size with LVEF of 65-70%.  Normal RV size and function.  Moderate to large circumferential pericardial effusion, stable to slightly decreased in size compared with 12/09/16.  No evidence of tamponade physiology.  TTE (10/25/16): Normal LV size with mild to moderate LVH. LVEF 60-65% with grade 2 diastolic dysfunction. RV size and function. Normal PA pressure. Small-to-moderate pericardial effusion.  TTE (10/12/16): Normal LV size. LVEF 55-60%.  Normal diastolic function. Normal RV size and  function. Normal PA pressure.  Recent CV Pertinent Labs: Lab Results  Component Value Date   CHOL 81 10/13/2016   HDL 34 (L) 10/13/2016   LDLCALC 36 10/13/2016   TRIG 56 10/13/2016   CHOLHDL 2.4 10/13/2016   INR 0.94 11/23/2016   BNP 69.0 10/25/2016   K 4.5 12/08/2016   MG 1.6 (L) 12/08/2016   BUN 15 12/08/2016   CREATININE 0.65 12/08/2016    Past medical and surgical history were reviewed and updated in EPIC.  Current Meds  Medication Sig  . ALPRAZolam (XANAX) 0.25 MG tablet Take 0.25 mg by mouth 2 (two) times daily as needed (for anxiety.).   Marland Kitchen aspirin EC 81 MG tablet Take 81 mg by mouth daily.  Marland Kitchen atorvastatin (LIPITOR) 40 MG tablet Take 20 mg by mouth at bedtime.  . clopidogrel (PLAVIX) 75 MG tablet Take 1 tablet (75 mg total) by mouth daily.  Marland Kitchen diltiazem (CARDIZEM CD) 180 MG 24 hr capsule Take 1 capsule (180 mg total) by mouth daily.  Marland Kitchen diltiazem (CARDIZEM) 30 MG tablet One tablet every 6 hours as needed for heart rate >100 (Patient taking differently: Take 30 mg by mouth every 6 (six) hours as needed (for HR>100). One tablet every 6 hours as needed for heart rate >100)  . furosemide (LASIX) 20 MG tablet Take 1 tablet (20 mg total) daily by mouth.  Marland Kitchen ibuprofen (ADVIL,MOTRIN) 200 MG tablet Take 400 mg by mouth daily.  . Insulin Human (INSULIN PUMP) SOLN Inject into the skin. HUMALOG 100 UNITS/ML WITH INSULIN PUMP  . insulin lispro (HUMALOG) 100 UNIT/ML cartridge Inject into the skin 3 (three) times daily with meals. INSULIN PUMP  . magnesium oxide (MAG-OX) 400 (241.3 Mg) MG tablet Take 1 tablet (400 mg total) daily by mouth.  . nitroGLYCERIN (NITROSTAT) 0.4 MG SL tablet Place 1 tablet (0.4 mg total) under the tongue every 5 (five) minutes x 3 doses as needed for chest pain.  . Vitamin D, Ergocalciferol, (DRISDOL) 50000 units CAPS capsule Take 50,000 Units once a week by mouth.    Allergies: Ceftriaxone; Rocephin [ceftriaxone sodium  in dextrose]; and Albuterol  Social History   Socioeconomic History  . Marital status: Married    Spouse name: Not on file  . Number of children: Not on file  . Years of education: Not on file  . Highest education level: Not on file  Social Needs  . Financial resource strain: Not on file  . Food insecurity - worry: Not on file  . Food insecurity - inability: Not on file  . Transportation needs - medical: Not on file  . Transportation needs - non-medical: Not on file  Occupational History  . Not on file  Tobacco Use  . Smoking status: Former Smoker    Packs/day: 0.50    Years: 47.00    Pack years: 23.50    Types: Cigarettes    Start date: 01/25/1969    Last attempt to quit: 10/11/2016    Years since quitting: 0.1  . Smokeless tobacco: Never Used  . Tobacco comment: Second-hand exposure through her husband & father as well.  Substance and Sexual Activity  . Alcohol use: Yes    Alcohol/week: 6.0 oz    Types: 10 Glasses of wine per week  . Drug use: No  . Sexual activity: Not Currently  Other Topics Concern  . Not on file  Social History Narrative   [facility] (10/14/16):   Patient has primarily done office work. Married for approximately  1 year. Has a dog at home but no bird exposure. No mold exposure.    Family History  Problem Relation Age of Onset  . Hypertension Mother   . Aortic aneurysm Father   . Hypertension Brother   . Lung disease Neg Hx   . Rheumatologic disease Neg Hx     Review of Systems: A 12-system review of systems was performed and was negative except as noted in the HPI.  --------------------------------------------------------------------------------------------------  Physical Exam: BP (!) 104/58 (BP Location: Left Arm, Patient Position: Sitting, Cuff Size: Normal)   Pulse (!) 112   Ht 5\' 3"  (1.6 m)   Wt 127 lb 8 oz (57.8 kg)   BMI 22.59 kg/m    Pulsus paradoxus: 6 mmHg.  General: Thin woman, seated comfortably in the exam room.   She is accompanied by her husband and daughter. HEENT: No conjunctival pallor or scleral icterus. Moist mucous membranes.  OP clear. Neck: Supple without lymphadenopathy, thyromegaly, JVD, or HJR.  Lungs: Normal work of breathing. Clear to auscultation bilaterally without wheezes or crackles. Heart: Tachycardic but regular without murmurs, rubs, or gallops. Non-displaced PMI. Abd: Bowel sounds present. Soft, NT/ND without hepatosplenomegaly Ext: No lower extremity edema. Radial, PT, and DP pulses are 2+ bilaterally.  Right radial arteriotomy site is well-healed. Skin: Warm and dry without rash.  EKG: Sinus tachycardia (heart rate 112 bpm) with low voltage and nonspecific T wave changes.  Lab Results  Component Value Date   WBC 12.8 (H) 12/08/2016   HGB 11.9 (L) 12/08/2016   HCT 35.9 12/08/2016   MCV 82.8 12/08/2016   PLT 473 (H) 12/08/2016    Lab Results  Component Value Date   NA 133 (L) 12/08/2016   K 4.5 12/08/2016   CL 99 (L) 12/08/2016   CO2 24 12/08/2016   BUN 15 12/08/2016   CREATININE 0.65 12/08/2016   GLUCOSE 191 (H) 12/08/2016   ALT 41 10/26/2016    Lab Results  Component Value Date   CHOL 81 10/13/2016   HDL 34 (L) 10/13/2016   LDLCALC 36 10/13/2016   TRIG 56 10/13/2016   CHOLHDL 2.4 10/13/2016    --------------------------------------------------------------------------------------------------  ASSESSMENT AND PLAN: Coronary artery disease without angina No chest pain reported, though Lisa [name] never had angina even in the setting of severe two-vessel coronary artery disease.  She notes some improvement in her energy following PCI to the RCA.  She has residual high-grade disease involving the proximal LCx.  I favor deferring treatment of the LCx, given sizable pericardial effusion after our recent PCI.  We will continue with Lisa [name] current medications.  Pericardial effusion Lisa [name] was noted to have a pericardial effusion during her initial  hospitalization in September, which had resolved on subsequent CT scans in October.  When she presented with chest pain following PCI earlier this month, a moderate to large pericardial effusion was again identified.  My primary concern is for a microperforation from the atherectomy and PCI, though the time course of her presentation is unusual for this.  Repeat echo performed yesterday shows stable to decreased size in the circumferential pericardial effusion.  There is no echo evidence of tamponade.  Her pulses paradoxus today is also normal at 6 mmHg.  I have encouraged her to stay well-hydrated.  We will stop her standing furosemide, as intravascular volume depletion will exacerbate the hemodynamic effects of the pericardial effusion.  If the effusion does not resolve on its own, we will need to consider rheumatologic workup to  assess for other causes.  There is no indication for pericardiocentesis at this time; in the setting of a DAPT, I would like to avoid this if possible.  Pedal limited echocardiogram in 1 month.  Sinus tachycardia This is likely multifactorial, including her pericardial effusion and deconditioning.  I think it is reasonable to continue with diltiazem.  Paroxysmal atrial fibrillation This was noted during her hospitalization in September as well as transiently during PCI earlier this month.  EKG today demonstrates sinus tachycardia.  Though her CHADSVASC score is at least 3, I favor deferring anticoagulation given ongoing dual antiplatelet therapy, pericardial effusion with concern for microperforation during recent PCI, and spontaneous thigh hematoma.  Follow-up: Return to clinic in 1 month with limited echo at that time.  Nelva Bush, MD 12/14/2016 10:13 AM

## 2016-12-14 NOTE — Patient Instructions (Signed)
Medication Instructions:  Your physician has recommended you make the following change in your medication:  1- CHANGE Lasix to 20 mg (1 tablet) by mouth once a day AS NEEDED for weight gain greater than 2lb in 1 day or 5 lb in 1 week.   Labwork: none  Testing/Procedures: Your physician has requested that you have an LIMITED echocardiogram IN 1 MONTH. Echocardiography is a painless test that uses sound waves to create images of your heart. It provides your doctor with information about the size and shape of your heart and how well your heart's chambers and valves are working. This procedure takes approximately one hour. There are no restrictions for this procedure.    Follow-Up: Your physician recommends that you schedule a follow-up appointment in: 1 MONTH WITH DR END (POSSIBLY FOLLOWING LIMITED ECHO IN THE SAME DAY).   If you need a refill on your cardiac medications before your next appointment, please call your pharmacy.

## 2016-12-16 ENCOUNTER — Encounter: Payer: Self-pay | Admitting: *Deleted

## 2016-12-16 ENCOUNTER — Encounter: Payer: Managed Care, Other (non HMO) | Attending: Internal Medicine | Admitting: *Deleted

## 2016-12-16 VITALS — Ht 62.0 in | Wt 126.3 lb

## 2016-12-16 DIAGNOSIS — I214 Non-ST elevation (NSTEMI) myocardial infarction: Secondary | ICD-10-CM

## 2016-12-16 DIAGNOSIS — Z955 Presence of coronary angioplasty implant and graft: Secondary | ICD-10-CM

## 2016-12-16 NOTE — Progress Notes (Signed)
Cardiac Individual Treatment Plan  Patient Details  Name: Lisa [name] MRN: [id number] Date of Birth: 11/04/1950 Referring Provider:     Cardiac Rehab from 12/16/2016 in [facility] Cardiac and Pulmonary Rehab  Referring Provider  End      Initial Encounter Date:    Cardiac Rehab from 12/16/2016 in [facility] Cardiac and Pulmonary Rehab  Date  12/16/16  Referring Provider  End      Visit Diagnosis: NSTEMI (non-ST elevated myocardial infarction) [facility])  Status post coronary artery stent placement  Patient's Home Medications on Admission:  Current Outpatient Medications:  .  ALPRAZolam (XANAX) 0.25 MG tablet, Take 0.25 mg by mouth 2 (two) times daily as needed (for anxiety.). , Disp: , Rfl:  .  aspirin EC 81 MG tablet, Take 81 mg by mouth daily., Disp: , Rfl:  .  atorvastatin (LIPITOR) 40 MG tablet, Take 20 mg by mouth at bedtime., Disp: , Rfl:  .  clopidogrel (PLAVIX) 75 MG tablet, Take 1 tablet (75 mg total) by mouth daily., Disp: 90 tablet, Rfl: 3 .  diltiazem (CARDIZEM CD) 180 MG 24 hr capsule, Take 1 capsule (180 mg total) by mouth daily., Disp: 90 capsule, Rfl: 3 .  diltiazem (CARDIZEM) 30 MG tablet, One tablet every 6 hours as needed for heart rate >100 (Patient taking differently: Take 30 mg by mouth every 6 (six) hours as needed (for HR>100). One tablet every 6 hours as needed for heart rate >100), Disp: 30 tablet, Rfl: 5 .  feeding supplement, GLUCERNA SHAKE, (GLUCERNA SHAKE) LIQD, Take 237 mLs by mouth 3 (three) times daily between meals., Disp: 30 Can, Rfl: 0 .  ibuprofen (ADVIL,MOTRIN) 200 MG tablet, Take 400 mg by mouth daily., Disp: , Rfl:  .  Insulin Human (INSULIN PUMP) SOLN, Inject into the skin. HUMALOG 100 UNITS/ML WITH INSULIN PUMP, Disp: , Rfl:  .  insulin lispro (HUMALOG) 100 UNIT/ML cartridge, Inject into the skin 3 (three) times daily with meals. INSULIN PUMP, Disp: , Rfl:  .  magnesium oxide (MAG-OX) 400 (241.3 Mg) MG tablet, Take 1 tablet (400 mg total) daily by  mouth., Disp: 30 tablet, Rfl: 0 .  nitroGLYCERIN (NITROSTAT) 0.4 MG SL tablet, Place 1 tablet (0.4 mg total) under the tongue every 5 (five) minutes x 3 doses as needed for chest pain., Disp: 25 tablet, Rfl: 12 .  Vitamin D, Ergocalciferol, (DRISDOL) 50000 units CAPS capsule, Take 50,000 Units once a week by mouth., Disp: , Rfl:  .  furosemide (LASIX) 20 MG tablet, Take 1 tablet (20 mg total) daily as needed by mouth. (Patient not taking: Reported on 12/16/2016), Disp: 30 tablet, Rfl: 6  Past Medical History: Past Medical History:  Diagnosis Date  . CAD (coronary artery disease)    a. 1999 PTCA @ [facility]; b. NSTEMI/Cath 10/11/2016: LM 20%, oLAD 40%, p-mLAD 40%, dLAD 40%, p-mLCx 95%, pRCA 30%, EF 55-65%; c. 10/2016 CT Surgery->rec PCI due to poor lung fxn. d. 12/02/16 DES to mid RCA.  . Groin hematoma    a. 10/2016 Spont groin hematoma w/ anemia req 1u prbcs.  . High cholesterol   . History of kidney stones   . Mucus plugging of bronchi    a. 10/2016 RLL Bronchus obstruction->bronchus vs mass;  b. 10/2016 CT Chest: resolution of RLL mucous plug.  . NSTEMI (non-ST elevated myocardial infarction) ([place]) 10/11/2016  . PAF (paroxysmal atrial fibrillation) ([place])    a. 12/2016 Noted during diag cath--CHA2DS2VASc = 3. No OAC in setting of need for DAPT and  h/o spont Thigh Hematoma in 10/2016.  Marland Kitchen Pericardial effusion    a. 10/2016 Echo: EF 60-65%, no rwma, Gr2 DD, small to mod pericardial eff. No tamponade; b. 12/2016 Echo: EF 60-65%, no rwma, Gr1 DD, 1.2 to 1.4 cm mod to large circumferential pericardial effusion w/o tamponade.  . Pneumonia 02/2016   "couple times before 02/2016 too" (12/02/2016)  . PVD (peripheral vascular disease) ([place])    a. Abdominal aortogram 10/11/16: Infrarenal abdominal aorta heavily calcified & ectatic with approximately 40-50% stenosis at the bifurcation. Mild diffuse disease w/ heavy calcification noted in the common & external iliac arteries bilaterally  . Type I diabetes mellitus  ([place])    a. On insulin pump.(12/02/2016)  . Ventricular fibrillation ([place])    a. 10/11/2016: Felt to be secondary to demand ischemia in the setting of underlying CAD and anaphylactic reaction from IV Rocephin; b. 10/2016 Echo: EF 55-60%, ? mild inf HK; c. 10/2016 Seen by EP: No ICD indication.    Tobacco Use: Social History   Tobacco Use  Smoking Status Current Some Day Smoker  . Packs/day: 0.25  . Years: 47.00  . Pack years: 11.75  . Types: Cigarettes  . Start date: 01/25/1969  . Last attempt to quit: 10/11/2016  . Years since quitting: 0.1  Smokeless Tobacco Never Used  Tobacco Comment   12/16/2016 Wants to quit, has not set quit date.  Has been informed about resources available for smoking cessation    Labs: Recent Review Flowsheet Data    Labs for ITP Cardiac and Pulmonary Rehab Latest Ref Rng & Units 10/11/2016 10/12/2016 10/13/2016 10/15/2016   Cholestrol 0 - 200 mg/dL - - 81 -   LDLCALC 0 - 99 mg/dL - - 36 -   HDL >40 mg/dL - - 34(L) -   Trlycerides <150 mg/dL 66 - 56 -   Hemoglobin A1c 4.8 - 5.6 % - 7.0(H) - 7.0(H)   PHART 7.350 - 7.450 7.23(L) - - 7.349(L)   PCO2ART 32.0 - 48.0 mmHg 36 - - 52.4(H)   HCO3 20.0 - 28.0 mmol/L 14.7(L) - - 28.1(H)   ACIDBASEDEF 0.0 - 2.0 mmol/L 12.1(H) - - -   O2SAT % 98.3 - - 98.1       Exercise Target Goals: Date: 12/16/16  Exercise Program Goal: Individual exercise prescription set with THRR, safety & activity barriers. Participant demonstrates ability to understand and report RPE using BORG scale, to self-measure pulse accurately, and to acknowledge the importance of the exercise prescription.  Exercise Prescription Goal: Starting with aerobic activity 30 plus minutes a day, 3 days per week for initial exercise prescription. Provide home exercise prescription and guidelines that participant acknowledges understanding prior to discharge.  Activity Barriers & Risk Stratification: Activity Barriers & Cardiac Risk Stratification -  12/16/16 1356      Activity Barriers & Cardiac Risk Stratification   Activity Barriers  None    Cardiac Risk Stratification  High       6 Minute Walk: 6 Minute Walk    Row Name 12/16/16 1402         6 Minute Walk   Distance  990 feet     Walk Time  6 minutes     # of Rest Breaks  0     MPH  1.88     METS  3     VO2 Peak  10.48     Resting HR  97 bpm     Resting BP  114/62  Resting Oxygen Saturation   98 %     Max Ex. HR  135 bpm     Max Ex. BP  116/60     2 Minute Post BP  102/64        Oxygen Initial Assessment:   Oxygen Re-Evaluation:   Oxygen Discharge (Final Oxygen Re-Evaluation):   Initial Exercise Prescription: Initial Exercise Prescription - 12/16/16 1400      Date of Initial Exercise RX and Referring Provider   Date  12/16/16    Referring Provider  End      Treadmill   MPH  1.8    Grade  2    Minutes  15    METs  2.87      NuStep   Level  2    SPM  80    Minutes  15    METs  2.8      REL-XR   Level  2    Speed  50    Minutes  15    METs  2.8      Prescription Details   Frequency (times per week)  3    Duration  Progress to 45 minutes of aerobic exercise without signs/symptoms of physical distress      Intensity   THRR 40-80% of Max Heartrate  120-144    Ratings of Perceived Exertion  11-13    Perceived Dyspnea  0-4      Resistance Training   Training Prescription  Yes    Weight  2 lb    Reps  10-15       Perform Capillary Blood Glucose checks as needed.  Exercise Prescription Changes: Exercise Prescription Changes    Row Name 12/16/16 1400             Response to Exercise   Blood Pressure (Admit)  114/62       Blood Pressure (Exercise)  116/60       Blood Pressure (Exit)  102/64       Heart Rate (Admit)  97 bpm       Heart Rate (Exercise)  134 bpm       Heart Rate (Exit)  118 bpm          Exercise Comments:   Exercise Goals and Review: Exercise Goals    Row Name 12/16/16 1401             Exercise  Goals   Increase Physical Activity  Yes       Intervention  Provide advice, education, support and counseling about physical activity/exercise needs.;Develop an individualized exercise prescription for aerobic and resistive training based on initial evaluation findings, risk stratification, comorbidities and participant's personal goals.       Expected Outcomes  Achievement of increased cardiorespiratory fitness and enhanced flexibility, muscular endurance and strength shown through measurements of functional capacity and personal statement of participant.       Increase Strength and Stamina  Yes       Intervention  Provide advice, education, support and counseling about physical activity/exercise needs.       Expected Outcomes  Achievement of increased cardiorespiratory fitness and enhanced flexibility, muscular endurance and strength shown through measurements of functional capacity and personal statement of participant.       Able to understand and use rate of perceived exertion (RPE) scale  Yes       Intervention  Provide education and explanation on how to use RPE scale  Expected Outcomes  Long Term:  Able to use RPE to guide intensity level when exercising independently       Able to understand and use Dyspnea scale  Yes       Intervention  Provide education and explanation on how to use Dyspnea scale       Expected Outcomes  Short Term: Able to use Dyspnea scale daily in rehab to express subjective sense of shortness of breath during exertion;Long Term: Able to use Dyspnea scale to guide intensity level when exercising independently       Knowledge and understanding of Target Heart Rate Range (THRR)  Yes       Intervention  Provide education and explanation of THRR including how the numbers were predicted and where they are located for reference       Expected Outcomes  Short Term: Able to state/look up THRR;Long Term: Able to use THRR to govern intensity when exercising  independently;Short Term: Able to use daily as guideline for intensity in rehab       Able to check pulse independently  Yes       Intervention  Provide education and demonstration on how to check pulse in carotid and radial arteries.;Review the importance of being able to check your own pulse for safety during independent exercise       Expected Outcomes  Long Term: Able to check pulse independently and accurately;Short Term: Able to explain why pulse checking is important during independent exercise       Understanding of Exercise Prescription  Yes       Intervention  Provide education, explanation, and written materials on patient's individual exercise prescription       Expected Outcomes  Short Term: Able to explain program exercise prescription;Long Term: Able to explain home exercise prescription to exercise independently          Exercise Goals Re-Evaluation :   Discharge Exercise Prescription (Final Exercise Prescription Changes): Exercise Prescription Changes - 12/16/16 1400      Response to Exercise   Blood Pressure (Admit)  114/62    Blood Pressure (Exercise)  116/60    Blood Pressure (Exit)  102/64    Heart Rate (Admit)  97 bpm    Heart Rate (Exercise)  134 bpm    Heart Rate (Exit)  118 bpm       Nutrition:  Target Goals: Understanding of nutrition guidelines, daily intake of sodium 1500mg , cholesterol 200mg , calories 30% from fat and 7% or less from saturated fats, daily to have 5 or more servings of fruits and vegetables.  Biometrics: Pre Biometrics - 12/16/16 1401      Pre Biometrics   Height  5\' 2"  (1.575 m)    Weight  126 lb 4.8 oz (57.3 kg)    Waist Circumference  31 inches    Hip Circumference  37.25 inches    Waist to Hip Ratio  0.83 %    BMI (Calculated)  23.09    Single Leg Stand  30 seconds        Nutrition Therapy Plan and Nutrition Goals: Nutrition Therapy & Goals - 12/16/16 1356      Intervention Plan   Intervention  Prescribe, educate and  counsel regarding individualized specific dietary modifications aiming towards targeted core components such as weight, hypertension, lipid management, diabetes, heart failure and other comorbidities.;Nutrition handout(s) given to patient.    Expected Outcomes  Short Term Goal: Understand basic principles of dietary content, such as calories, fat, sodium, cholesterol  and nutrients.;Short Term Goal: A plan has been developed with personal nutrition goals set during dietitian appointment.;Long Term Goal: Adherence to prescribed nutrition plan.       Nutrition Discharge: Rate Your Plate Scores:   Nutrition Goals Re-Evaluation:   Nutrition Goals Discharge (Final Nutrition Goals Re-Evaluation):   Psychosocial: Target Goals: Acknowledge presence or absence of significant depression and/or stress, maximize coping skills, provide positive support system. Participant is able to verbalize types and ability to use techniques and skills needed for reducing stress and depression.   Initial Review & Psychosocial Screening: Initial Psych Review & Screening - 12/16/16 1410      Initial Review   Current issues with  Current Stress Concerns    Source of Stress Concerns  Unable to participate in former interests or hobbies    Comments  Still tired, low energy since discharge.  "They had to do 45 minutes of CPR on me"       [place]?  Yes spouse   daughter      Barriers   Psychosocial barriers to participate in program  There are no identifiable barriers or psychosocial needs.;The patient should benefit from training in stress management and relaxation.      Screening Interventions   Interventions  Yes;Encouraged to exercise;Program counselor consult;To provide support and resources with identified psychosocial needs;Provide feedback about the scores to participant    Expected Outcomes  Short Term goal: Utilizing psychosocial counselor, staff and physician to assist with  identification of specific Stressors or current issues interfering with healing process. Setting desired goal for each stressor or current issue identified.;Long Term Goal: Stressors or current issues are controlled or eliminated.;Short Term goal: Identification and review with participant of any Quality of Life or Depression concerns found by scoring the questionnaire.;Long Term goal: The participant improves quality of Life and PHQ9 Scores as seen by post scores and/or verbalization of changes       Quality of Life Scores:  Quality of Life - 12/16/16 1413      Quality of Life Scores   Health/Function Pre  20.43 %    Socioeconomic Pre  28.21 %    Psych/Spiritual Pre  26.57 %    Family Pre  30 %    GLOBAL Pre  24.71 %       PHQ-9: Recent Review Flowsheet Data    Depression screen [facility] 2/9 12/16/2016   Decreased Interest 1   Down, Depressed, Hopeless 0   PHQ - 2 Score 1   Altered sleeping 0   Tired, decreased energy 3   Change in appetite 2   Feeling bad or failure about yourself  0   Trouble concentrating 0   Moving slowly or fidgety/restless 0   Suicidal thoughts 0   PHQ-9 Score 6   Difficult doing work/chores Somewhat difficult     Interpretation of Total Score  Total Score Depression Severity:  1-4 = Minimal depression, 5-9 = Mild depression, 10-14 = Moderate depression, 15-19 = Moderately severe depression, 20-27 = Severe depression   Psychosocial Evaluation and Intervention:   Psychosocial Re-Evaluation:   Psychosocial Discharge (Final Psychosocial Re-Evaluation):   Vocational Rehabilitation: Provide vocational rehab assistance to qualifying candidates.   Vocational Rehab Evaluation & Intervention: Vocational Rehab - 12/16/16 1417      Initial Vocational Rehab Evaluation & Intervention   Assessment shows need for Vocational Rehabilitation  No       Education: Education Goals: Education classes will be provided on  a variety of topics geared toward better  understanding of heart health and risk factor modification. Participant will state understanding/return demonstration of topics presented as noted by education test scores.  Learning Barriers/Preferences: Learning Barriers/Preferences - 12/16/16 1416      Learning Barriers/Preferences   Learning Barriers  None    Learning Preferences  Verbal Instruction       Education Topics: General Nutrition Guidelines/Fats and Fiber: -Group instruction provided by verbal, written material, models and posters to present the general guidelines for heart healthy nutrition. Gives an explanation and review of dietary fats and fiber.   Controlling Sodium/Reading Food Labels: -Group verbal and written material supporting the discussion of sodium use in heart healthy nutrition. Review and explanation with models, verbal and written materials for utilization of the food label.   Exercise Physiology & Risk Factors: - Group verbal and written instruction with models to review the exercise physiology of the cardiovascular system and associated critical values. Details cardiovascular disease risk factors and the goals associated with each risk factor.   Aerobic Exercise & Resistance Training: - Gives group verbal and written discussion on the health impact of inactivity. On the components of aerobic and resistive training programs and the benefits of this training and how to safely progress through these programs.   Flexibility, Balance, General Exercise Guidelines: - Provides group verbal and written instruction on the benefits of flexibility and balance training programs. Provides general exercise guidelines with specific guidelines to those with heart or lung disease. Demonstration and skill practice provided.   Stress Management: - Provides group verbal and written instruction about the health risks of elevated stress, cause of high stress, and healthy ways to reduce stress.   Depression: - Provides  group verbal and written instruction on the correlation between heart/lung disease and depressed mood, treatment options, and the stigmas associated with seeking treatment.   Anatomy & Physiology of the Heart: - Group verbal and written instruction and models provide basic cardiac anatomy and physiology, with the coronary electrical and arterial systems. Review of: AMI, Angina, Valve disease, Heart Failure, Cardiac Arrhythmia, Pacemakers, and the ICD.   Cardiac Procedures: - Group verbal and written instruction to review commonly prescribed medications for heart disease. Reviews the medication, class of the drug, and side effects. Includes the steps to properly store meds and maintain the prescription regimen. (beta blockers and nitrates)   Cardiac Medications I: - Group verbal and written instruction to review commonly prescribed medications for heart disease. Reviews the medication, class of the drug, and side effects. Includes the steps to properly store meds and maintain the prescription regimen.   Cardiac Medications II: -Group verbal and written instruction to review commonly prescribed medications for heart disease. Reviews the medication, class of the drug, and side effects. (all other drug classes)    Go Sex-Intimacy & Heart Disease, Get SMART - Goal Setting: - Group verbal and written instruction through game format to discuss heart disease and the return to sexual intimacy. Provides group verbal and written material to discuss and apply goal setting through the application of the S.M.A.R.T. Method.   Other Matters of the Heart: - Provides group verbal, written materials and models to describe Heart Failure, Angina, Valve Disease, Peripheral Artery Disease, and Diabetes in the realm of heart disease. Includes description of the disease process and treatment options available to the cardiac patient.   Exercise & Equipment Safety: - Individual verbal instruction and demonstration  of equipment use and safety with use of the  equipment.   Cardiac Rehab from 12/16/2016 in [facility] Cardiac and Pulmonary Rehab  Date  12/16/16  Educator  SB  Instruction Review Code  1- Verbalizes Understanding      Infection Prevention: - Provides verbal and written material to individual with discussion of infection control including proper hand washing and proper equipment cleaning during exercise session.   Cardiac Rehab from 12/16/2016 in [facility] Cardiac and Pulmonary Rehab  Date  12/16/16  Educator  SB  Instruction Review Code  1- Verbalizes Understanding      Falls Prevention: - Provides verbal and written material to individual with discussion of falls prevention and safety.   Cardiac Rehab from 12/16/2016 in [facility] Cardiac and Pulmonary Rehab  Date  12/16/16  Educator  SB  Instruction Review Code  1- Verbalizes Understanding      Diabetes: - Individual verbal and written instruction to review signs/symptoms of diabetes, desired ranges of glucose level fasting, after meals and with exercise. Acknowledge that pre and post exercise glucose checks will be done for 3 sessions at entry of program.   Cardiac Rehab from 12/16/2016 in [facility] Cardiac and Pulmonary Rehab  Date  12/16/16  Educator  SB  Instruction Review Code  1- Verbalizes Understanding      Other: -Provides group and verbal instruction on various topics (see comments)    Knowledge Questionnaire Score: Knowledge Questionnaire Score - 12/16/16 1417      Knowledge Questionnaire Score   Pre Score  23/28       Core Components/Risk Factors/Patient Goals at Admission: Personal Goals and Risk Factors at Admission - 12/16/16 1408      Core Components/Risk Factors/Patient Goals on Admission    Weight Management  Yes;Weight Loss    Intervention  Weight Management: Develop a combined nutrition and exercise program designed to reach desired caloric intake, while maintaining appropriate intake of nutrient and fiber, sodium  and fats, and appropriate energy expenditure required for the weight goal.;Weight Management: Provide education and appropriate resources to help participant work on and attain dietary goals.    Admit Weight  126 lb 4.8 oz (57.3 kg)    Goal Weight: Short Term  124 lb (56.2 kg)    Goal Weight: Long Term  120 lb (54.4 kg)    Expected Outcomes  Short Term: Continue to assess and modify interventions until short term weight is achieved;Long Term: Adherence to nutrition and physical activity/exercise program aimed toward attainment of established weight goal;Weight Loss: Understanding of general recommendations for a balanced deficit meal plan, which promotes 1-2 lb weight loss per week and includes a negative energy balance of [phone] kcal/d    Tobacco Cessation  Yes    Number of packs per day  1-2 cigarettes per day Wants to quit. No quit date set yet    Intervention  Assist the participant in steps to quit. Provide individualized education and counseling about committing to Tobacco Cessation, relapse prevention, and pharmacological support that can be provided by physician.;Advice worker, assist with locating and accessing local/national Quit Smoking programs, and support quit date choice.    Expected Outcomes  Short Term: Will demonstrate readiness to quit, by selecting a quit date.;Long Term: Complete abstinence from all tobacco products for at least 12 months from quit date.;Short Term: Will quit all tobacco product use, adhering to prevention of relapse plan.    Diabetes  Yes    Intervention  Provide education about signs/symptoms and action to take for hypo/hyperglycemia.;Provide education about proper nutrition, including hydration,  and aerobic/resistive exercise prescription along with prescribed medications to achieve blood glucose in normal ranges: Fasting glucose 65-99 mg/dL    Expected Outcomes  Short Term: Participant verbalizes understanding of the signs/symptoms and immediate  care of hyper/hypoglycemia, proper foot care and importance of medication, aerobic/resistive exercise and nutrition plan for blood glucose control.;Long Term: Attainment of HbA1C < 7%.    Lipids  Yes    Intervention  Provide education and support for participant on nutrition & aerobic/resistive exercise along with prescribed medications to achieve LDL 70mg , HDL >40mg .    Expected Outcomes  Short Term: Participant states understanding of desired cholesterol values and is compliant with medications prescribed. Participant is following exercise prescription and nutrition guidelines.;Long Term: Cholesterol controlled with medications as prescribed, with individualized exercise RX and with personalized nutrition plan. Value goals: LDL < 70mg , HDL > 40 mg.       Core Components/Risk Factors/Patient Goals Review:    Core Components/Risk Factors/Patient Goals at Discharge (Final Review):    ITP Comments: ITP Comments    Row Name 12/16/16 1350           ITP Comments  Medical review today. Initial ITP sent to Dr Loleta Chance for review, changes as needed and cosign.  Documentation of diagnosis can be found in [facility] 12/02/2016          Comments:

## 2016-12-16 NOTE — Patient Instructions (Signed)
Patient Instructions  Patient Details  Name: Lisa [name] MRN: [id number] Date of Birth: [birth year] Referring Provider:  Nelva Bush, MD  Below are the personal goals you chose as well as exercise and nutrition goals. Our goal is to help you keep on track towards obtaining and maintaining your goals. We will be discussing your progress on these goals with you throughout the program.  Initial Exercise Prescription: Initial Exercise Prescription - 12/16/16 1400      Date of Initial Exercise RX and Referring Provider   Date  12/16/16    Referring Provider  End      Treadmill   MPH  1.8    Grade  2    Minutes  15    METs  2.87      NuStep   Level  2    SPM  80    Minutes  15    METs  2.8      REL-XR   Level  2    Speed  50    Minutes  15    METs  2.8      Prescription Details   Frequency (times per week)  3    Duration  Progress to 45 minutes of aerobic exercise without signs/symptoms of physical distress      Intensity   THRR 40-80% of Max Heartrate  120-144    Ratings of Perceived Exertion  11-13    Perceived Dyspnea  0-4      Resistance Training   Training Prescription  Yes    Weight  2 lb    Reps  10-15       Exercise Goals: Frequency: Be able to perform aerobic exercise three times per week working toward 3-5 days per week.  Intensity: Work with a perceived exertion of 11 (fairly light) - 15 (hard) as tolerated. Follow your new exercise prescription and watch for changes in prescription as you progress with the program. Changes will be reviewed with you when they are made.  Duration: You should be able to do 30 minutes of continuous aerobic exercise in addition to a 5 minute warm-up and a 5 minute cool-down routine.  Nutrition Goals: Your personal nutrition goals will be established when you do your nutrition analysis with the dietician.  The following are nutrition guidelines to follow: Cholesterol < 200mg /day Sodium < 1500mg /day Fiber: Women  over 50 yrs - 21 grams per day  Personal Goals: Personal Goals and Risk Factors at Admission - 12/16/16 1408      Core Components/Risk Factors/Patient Goals on Admission    Weight Management  Yes;Weight Loss    Intervention  Weight Management: Develop a combined nutrition and exercise program designed to reach desired caloric intake, while maintaining appropriate intake of nutrient and fiber, sodium and fats, and appropriate energy expenditure required for the weight goal.;Weight Management: Provide education and appropriate resources to help participant work on and attain dietary goals.    Admit Weight  126 lb 4.8 oz (57.3 kg)    Goal Weight: Short Term  124 lb (56.2 kg)    Goal Weight: Long Term  120 lb (54.4 kg)    Expected Outcomes  Short Term: Continue to assess and modify interventions until short term weight is achieved;Long Term: Adherence to nutrition and physical activity/exercise program aimed toward attainment of established weight goal;Weight Loss: Understanding of general recommendations for a balanced deficit meal plan, which promotes 1-2 lb weight loss per week and includes a negative energy balance of  [phone] kcal/d    Tobacco Cessation  Yes    Number of packs per day  1-2 cigarettes per day Wants to quit. No quit date set yet    Intervention  Assist the participant in steps to quit. Provide individualized education and counseling about committing to Tobacco Cessation, relapse prevention, and pharmacological support that can be provided by physician.;Advice worker, assist with locating and accessing local/national Quit Smoking programs, and support quit date choice.    Expected Outcomes  Short Term: Will demonstrate readiness to quit, by selecting a quit date.;Long Term: Complete abstinence from all tobacco products for at least 12 months from quit date.;Short Term: Will quit all tobacco product use, adhering to prevention of relapse plan.    Diabetes  Yes     Intervention  Provide education about signs/symptoms and action to take for hypo/hyperglycemia.;Provide education about proper nutrition, including hydration, and aerobic/resistive exercise prescription along with prescribed medications to achieve blood glucose in normal ranges: Fasting glucose 65-99 mg/dL    Expected Outcomes  Short Term: Participant verbalizes understanding of the signs/symptoms and immediate care of hyper/hypoglycemia, proper foot care and importance of medication, aerobic/resistive exercise and nutrition plan for blood glucose control.;Long Term: Attainment of HbA1C < 7%.    Lipids  Yes    Intervention  Provide education and support for participant on nutrition & aerobic/resistive exercise along with prescribed medications to achieve LDL 70mg , HDL >40mg .    Expected Outcomes  Short Term: Participant states understanding of desired cholesterol values and is compliant with medications prescribed. Participant is following exercise prescription and nutrition guidelines.;Long Term: Cholesterol controlled with medications as prescribed, with individualized exercise RX and with personalized nutrition plan. Value goals: LDL < 70mg , HDL > 40 mg.       Tobacco Use Initial Evaluation: Social History   Tobacco Use  Smoking Status Current Some Day Smoker  . Packs/day: 0.25  . Years: 47.00  . Pack years: 11.75  . Types: Cigarettes  . Start date: 01/25/1969  . Last attempt to quit: 10/11/2016  . Years since quitting: 0.1  Smokeless Tobacco Never Used  Tobacco Comment   12/16/2016 Wants to quit, has not set quit date.  Has been informed about resources available for smoking cessation    Exercise Goals and Review: Exercise Goals    Row Name 12/16/16 1401             Exercise Goals   Increase Physical Activity  Yes       Intervention  Provide advice, education, support and counseling about physical activity/exercise needs.;Develop an individualized exercise prescription for  aerobic and resistive training based on initial evaluation findings, risk stratification, comorbidities and participant's personal goals.       Expected Outcomes  Achievement of increased cardiorespiratory fitness and enhanced flexibility, muscular endurance and strength shown through measurements of functional capacity and personal statement of participant.       Increase Strength and Stamina  Yes       Intervention  Provide advice, education, support and counseling about physical activity/exercise needs.       Expected Outcomes  Achievement of increased cardiorespiratory fitness and enhanced flexibility, muscular endurance and strength shown through measurements of functional capacity and personal statement of participant.       Able to understand and use rate of perceived exertion (RPE) scale  Yes       Intervention  Provide education and explanation on how to use RPE scale  Expected Outcomes  Long Term:  Able to use RPE to guide intensity level when exercising independently       Able to understand and use Dyspnea scale  Yes       Intervention  Provide education and explanation on how to use Dyspnea scale       Expected Outcomes  Short Term: Able to use Dyspnea scale daily in rehab to express subjective sense of shortness of breath during exertion;Long Term: Able to use Dyspnea scale to guide intensity level when exercising independently       Knowledge and understanding of Target Heart Rate Range (THRR)  Yes       Intervention  Provide education and explanation of THRR including how the numbers were predicted and where they are located for reference       Expected Outcomes  Short Term: Able to state/look up THRR;Long Term: Able to use THRR to govern intensity when exercising independently;Short Term: Able to use daily as guideline for intensity in rehab       Able to check pulse independently  Yes       Intervention  Provide education and demonstration on how to check pulse in carotid and  radial arteries.;Review the importance of being able to check your own pulse for safety during independent exercise       Expected Outcomes  Long Term: Able to check pulse independently and accurately;Short Term: Able to explain why pulse checking is important during independent exercise       Understanding of Exercise Prescription  Yes       Intervention  Provide education, explanation, and written materials on patient's individual exercise prescription       Expected Outcomes  Short Term: Able to explain program exercise prescription;Long Term: Able to explain home exercise prescription to exercise independently          Copy of goals given to participant.

## 2016-12-26 ENCOUNTER — Telehealth: Payer: Self-pay | Admitting: *Deleted

## 2016-12-26 ENCOUNTER — Ambulatory Visit: Payer: Self-pay

## 2016-12-26 NOTE — Telephone Encounter (Signed)
Hollan called and stated that she was not feeling well today and would not be able to come to class.

## 2016-12-27 ENCOUNTER — Telehealth: Payer: Self-pay | Admitting: Internal Medicine

## 2016-12-27 MED ORDER — DILTIAZEM HCL ER COATED BEADS 240 MG PO CP24: 240.0000 mg | ORAL_CAPSULE | Freq: Every day | ORAL | 3 refills | Status: DC

## 2016-12-27 NOTE — Telephone Encounter (Signed)
S/w patient. She verbalized understanding of recommendations. Rx sent to pharmacy. Patient scheduled for nurse visit for this Friday.

## 2016-12-27 NOTE — Telephone Encounter (Signed)
Pt calling stating her HR is running 100-119 This has been going on since yesterday afternoon States the medication is not working   Would like some advise on this  She is concerned

## 2016-12-27 NOTE — Telephone Encounter (Signed)
We will have Lisa [name] come in for BP/HR check in the office this week and increase diltiazem to 240 mg daily, as BP allows. If her symptoms worsen, she should seek immediate medical attention in the ED.  Nelva Bush, MD [facility] Pager: [phone]

## 2016-12-27 NOTE — Telephone Encounter (Signed)
S/w patient. Patient reports HR increased to 105-110, this morning when she got up it was 119. Patient has taken her PRN diltiazem 30 mg twice a day for the past 3 days. She states her HR will be 105-110, she will take the Diltiazem 30 mg and it will help it come back down to the mid- 80's for about 4 hours then it gradually increases back greater than 100. Denies chest pain, shortness of breath, dizziness, or palpitations. Her main complaint is lack of energy and fatigue. Prior to 3 days ago, her HR maintained in the 80's.  Patient reports BP has normal but does not have the readings with her at this time.  Patient's next appointment is 01/19/17 for echo to reassess pericardial effusion and see Dr End. Will route to Dr End for advice.

## 2016-12-28 ENCOUNTER — Ambulatory Visit: Payer: Self-pay

## 2016-12-29 ENCOUNTER — Ambulatory Visit: Payer: Self-pay

## 2016-12-29 ENCOUNTER — Ambulatory Visit: Payer: Managed Care, Other (non HMO)

## 2016-12-30 ENCOUNTER — Ambulatory Visit (INDEPENDENT_AMBULATORY_CARE_PROVIDER_SITE_OTHER): Payer: Managed Care, Other (non HMO) | Admitting: *Deleted

## 2016-12-30 ENCOUNTER — Telehealth: Payer: Self-pay | Admitting: *Deleted

## 2016-12-30 VITALS — BP 111/70 | HR 91 | Ht 63.0 in | Wt 126.0 lb

## 2016-12-30 DIAGNOSIS — I2510 Atherosclerotic heart disease of native coronary artery without angina pectoris: Secondary | ICD-10-CM | POA: Diagnosis not present

## 2016-12-30 DIAGNOSIS — I480 Paroxysmal atrial fibrillation: Secondary | ICD-10-CM

## 2016-12-30 NOTE — Telephone Encounter (Signed)
-----   Message from Nelva Bush, MD sent at 12/30/2016  4:00 PM EST -----   ----- Message ----- From: Vanessa Ralphs, RN Sent: 12/30/2016   1:55 PM To: Nelva Bush, MD

## 2016-12-30 NOTE — Progress Notes (Signed)
BP and heart rate appear normal. As long as she feels well, I do not think that [name] needs to take additional diltiazem if her HR is a little over 100 bpm. Maybe it would be best for her to check her HR and BP less frequently, as minor changes in these readings seem to be causing some anxiety. If she notes palpitations or lightheadedness, she should certainly check her vitals.  Lisa Bush, MD [facility] Pager: [phone]

## 2016-12-30 NOTE — Progress Notes (Signed)
  1.) Reason for visit: BP/HR check   2.) Name of MD requesting visit: Dr End  3.) H&P: pericardial effusion, PAF, CAD  4.) ROS related to problem: Patient complaint of increased heart rate. Diltiazem increased to 240 mg on 12/27/16.  Patient states heart rate is elevated first thing in the morning prior to taking her Diltiazem around 115. Once she takes her medication, it decreases. She has not had to take the prn Diltiazem since the increase. BP 111/70, HR 91.   She is concerned that her HR is around 99-100 at bedtime sometimes and wonders if she took the Diltiazem 30 mg tablet it would help her heart rate in the morning. Advised her to follow instructions for 30 mg tablet as stated on Rx to take if HR >100.  Patient denies palpitations and can not tell her heart rate is elevated until she takes her BP or uses the pulse ox.  5.) Assessment and plan per MD: Patient advised to continue medications at this time and continue to monitor. Routing visit to Dr End for review.

## 2016-12-30 NOTE — Telephone Encounter (Signed)
Spoke with patient. She verbalized understanding of Dr Darnelle Bos advice and recommendations.

## 2016-12-30 NOTE — Telephone Encounter (Signed)
Author: Nelva Bush, MD Service: Cardiology Author Type: Physician  Filed: 12/30/2016 4:00 PM Encounter Date: 12/30/2016 Status: Signed  Editor: Nelva Bush, MD (Physician)       [] Hide copied text  [] Hover for details   BP and heart rate appear normal. As long as she feels well, I do not think that Lisa [name] needs to take additional diltiazem if her HR is a little over 100 bpm. Maybe it would be best for her to check her HR and BP less frequently, as minor changes in these readings seem to be causing some anxiety. If she notes palpitations or lightheadedness, she should certainly check her vitals.  Nelva Bush, MD [facility] Pager: [phone]

## 2017-01-02 ENCOUNTER — Ambulatory Visit: Payer: Self-pay

## 2017-01-04 ENCOUNTER — Encounter: Payer: Self-pay | Admitting: *Deleted

## 2017-01-04 ENCOUNTER — Ambulatory Visit: Payer: Self-pay | Admitting: Internal Medicine

## 2017-01-04 ENCOUNTER — Ambulatory Visit: Payer: Self-pay

## 2017-01-04 DIAGNOSIS — I214 Non-ST elevation (NSTEMI) myocardial infarction: Secondary | ICD-10-CM

## 2017-01-04 DIAGNOSIS — Z955 Presence of coronary angioplasty implant and graft: Secondary | ICD-10-CM

## 2017-01-04 NOTE — Progress Notes (Signed)
Cardiac Individual Treatment Plan  Patient Details  Name: Lisa [name] MRN: [id number] Date of Birth: 1950/11/06 Referring Provider:     Cardiac Rehab from 12/16/2016 in [facility] Cardiac and Pulmonary Rehab  Referring Provider  End      Initial Encounter Date:    Cardiac Rehab from 12/16/2016 in [facility] Cardiac and Pulmonary Rehab  Date  12/16/16  Referring Provider  End      Visit Diagnosis: NSTEMI (non-ST elevated myocardial infarction) [facility])  Status post coronary artery stent placement  Patient's Home Medications on Admission:  Current Outpatient Medications:  .  ALPRAZolam (XANAX) 0.25 MG tablet, Take 0.25 mg by mouth 2 (two) times daily as needed (for anxiety.). , Disp: , Rfl:  .  aspirin EC 81 MG tablet, Take 81 mg by mouth daily., Disp: , Rfl:  .  atorvastatin (LIPITOR) 40 MG tablet, Take 20 mg by mouth at bedtime., Disp: , Rfl:  .  clopidogrel (PLAVIX) 75 MG tablet, Take 1 tablet (75 mg total) by mouth daily., Disp: 90 tablet, Rfl: 3 .  diltiazem (CARDIZEM CD) 240 MG 24 hr capsule, Take 1 capsule (240 mg total) by mouth daily., Disp: 90 capsule, Rfl: 3 .  diltiazem (CARDIZEM) 30 MG tablet, One tablet every 6 hours as needed for heart rate >100 (Patient taking differently: Take 30 mg by mouth every 6 (six) hours as needed (for HR>100). One tablet every 6 hours as needed for heart rate >100), Disp: 30 tablet, Rfl: 5 .  feeding supplement, GLUCERNA SHAKE, (GLUCERNA SHAKE) LIQD, Take 237 mLs by mouth 3 (three) times daily between meals., Disp: 30 Can, Rfl: 0 .  furosemide (LASIX) 20 MG tablet, Take 1 tablet (20 mg total) daily as needed by mouth., Disp: 30 tablet, Rfl: 6 .  ibuprofen (ADVIL,MOTRIN) 200 MG tablet, Take 400 mg by mouth daily., Disp: , Rfl:  .  Insulin Human (INSULIN PUMP) SOLN, Inject into the skin. HUMALOG 100 UNITS/ML WITH INSULIN PUMP, Disp: , Rfl:  .  insulin lispro (HUMALOG) 100 UNIT/ML cartridge, Inject into the skin 3 (three) times daily with meals. INSULIN  PUMP, Disp: , Rfl:  .  magnesium oxide (MAG-OX) 400 (241.3 Mg) MG tablet, Take 1 tablet (400 mg total) daily by mouth., Disp: 30 tablet, Rfl: 0 .  nitroGLYCERIN (NITROSTAT) 0.4 MG SL tablet, Place 1 tablet (0.4 mg total) under the tongue every 5 (five) minutes x 3 doses as needed for chest pain., Disp: 25 tablet, Rfl: 12 .  Vitamin D, Ergocalciferol, (DRISDOL) 50000 units CAPS capsule, Take 50,000 Units once a week by mouth., Disp: , Rfl:   Past Medical History: Past Medical History:  Diagnosis Date  . CAD (coronary artery disease)    a. 1999 PTCA @ [facility]; b. NSTEMI/Cath 10/11/2016: LM 20%, oLAD 40%, p-mLAD 40%, dLAD 40%, p-mLCx 95%, pRCA 30%, EF 55-65%; c. 10/2016 CT Surgery->rec PCI due to poor lung fxn. d. 12/02/16 DES to mid RCA.  . Groin hematoma    a. 10/2016 Spont groin hematoma w/ anemia req 1u prbcs.  . High cholesterol   . History of kidney stones   . Mucus plugging of bronchi    a. 10/2016 RLL Bronchus obstruction->bronchus vs mass;  b. 10/2016 CT Chest: resolution of RLL mucous plug.  . NSTEMI (non-ST elevated myocardial infarction) ([place]) 10/11/2016  . PAF (paroxysmal atrial fibrillation) ([place])    a. 12/2016 Noted during diag cath--CHA2DS2VASc = 3. No OAC in setting of need for DAPT and h/o spont Thigh Hematoma in 10/2016.  Marland Kitchen  Pericardial effusion    a. 10/2016 Echo: EF 60-65%, no rwma, Gr2 DD, small to mod pericardial eff. No tamponade; b. 12/2016 Echo: EF 60-65%, no rwma, Gr1 DD, 1.2 to 1.4 cm mod to large circumferential pericardial effusion w/o tamponade.  . Pneumonia 02/2016   "couple times before 02/2016 too" (12/02/2016)  . PVD (peripheral vascular disease) ([place])    a. Abdominal aortogram 10/11/16: Infrarenal abdominal aorta heavily calcified & ectatic with approximately 40-50% stenosis at the bifurcation. Mild diffuse disease w/ heavy calcification noted in the common & external iliac arteries bilaterally  . Type I diabetes mellitus ([place])    a. On insulin pump.(12/02/2016)  .  Ventricular fibrillation ([place])    a. 10/11/2016: Felt to be secondary to demand ischemia in the setting of underlying CAD and anaphylactic reaction from IV Rocephin; b. 10/2016 Echo: EF 55-60%, ? mild inf HK; c. 10/2016 Seen by EP: No ICD indication.    Tobacco Use: Social History   Tobacco Use  Smoking Status Current Some Day Smoker  . Packs/day: 0.25  . Years: 47.00  . Pack years: 11.75  . Types: Cigarettes  . Start date: 01/25/1969  . Last attempt to quit: 10/11/2016  . Years since quitting: 0.2  Smokeless Tobacco Never Used  Tobacco Comment   12/16/2016 Wants to quit, has not set quit date.  Has been informed about resources available for smoking cessation    Labs: Recent Review Flowsheet Data    Labs for ITP Cardiac and Pulmonary Rehab Latest Ref Rng & Units 10/11/2016 10/12/2016 10/13/2016 10/15/2016   Cholestrol 0 - 200 mg/dL - - 81 -   LDLCALC 0 - 99 mg/dL - - 36 -   HDL >40 mg/dL - - 34(L) -   Trlycerides <150 mg/dL 66 - 56 -   Hemoglobin A1c 4.8 - 5.6 % - 7.0(H) - 7.0(H)   PHART 7.350 - 7.450 7.23(L) - - 7.349(L)   PCO2ART 32.0 - 48.0 mmHg 36 - - 52.4(H)   HCO3 20.0 - 28.0 mmol/L 14.7(L) - - 28.1(H)   ACIDBASEDEF 0.0 - 2.0 mmol/L 12.1(H) - - -   O2SAT % 98.3 - - 98.1       Exercise Target Goals:    Exercise Program Goal: Individual exercise prescription set with THRR, safety & activity barriers. Participant demonstrates ability to understand and report RPE using BORG scale, to self-measure pulse accurately, and to acknowledge the importance of the exercise prescription.  Exercise Prescription Goal: Starting with aerobic activity 30 plus minutes a day, 3 days per week for initial exercise prescription. Provide home exercise prescription and guidelines that participant acknowledges understanding prior to discharge.  Activity Barriers & Risk Stratification: Activity Barriers & Cardiac Risk Stratification - 12/16/16 1356      Activity Barriers & Cardiac Risk  Stratification   Activity Barriers  None    Cardiac Risk Stratification  High       6 Minute Walk: 6 Minute Walk    Row Name 12/16/16 1402         6 Minute Walk   Distance  990 feet     Walk Time  6 minutes     # of Rest Breaks  0     MPH  1.88     METS  3     VO2 Peak  10.48     Resting HR  97 bpm     Resting BP  114/62     Resting Oxygen Saturation   98 %  Max Ex. HR  135 bpm     Max Ex. BP  116/60     2 Minute Post BP  102/64        Oxygen Initial Assessment:   Oxygen Re-Evaluation:   Oxygen Discharge (Final Oxygen Re-Evaluation):   Initial Exercise Prescription: Initial Exercise Prescription - 12/16/16 1400      Date of Initial Exercise RX and Referring Provider   Date  12/16/16    Referring Provider  End      Treadmill   MPH  1.8    Grade  2    Minutes  15    METs  2.87      NuStep   Level  2    SPM  80    Minutes  15    METs  2.8      REL-XR   Level  2    Speed  50    Minutes  15    METs  2.8      Prescription Details   Frequency (times per week)  3    Duration  Progress to 45 minutes of aerobic exercise without signs/symptoms of physical distress      Intensity   THRR 40-80% of Max Heartrate  120-144    Ratings of Perceived Exertion  11-13    Perceived Dyspnea  0-4      Resistance Training   Training Prescription  Yes    Weight  2 lb    Reps  10-15       Perform Capillary Blood Glucose checks as needed.  Exercise Prescription Changes: Exercise Prescription Changes    Row Name 12/16/16 1400             Response to Exercise   Blood Pressure (Admit)  114/62       Blood Pressure (Exercise)  116/60       Blood Pressure (Exit)  102/64       Heart Rate (Admit)  97 bpm       Heart Rate (Exercise)  134 bpm       Heart Rate (Exit)  118 bpm          Exercise Comments:   Exercise Goals and Review: Exercise Goals    Row Name 12/16/16 1401             Exercise Goals   Increase Physical Activity  Yes        Intervention  Provide advice, education, support and counseling about physical activity/exercise needs.;Develop an individualized exercise prescription for aerobic and resistive training based on initial evaluation findings, risk stratification, comorbidities and participant's personal goals.       Expected Outcomes  Achievement of increased cardiorespiratory fitness and enhanced flexibility, muscular endurance and strength shown through measurements of functional capacity and personal statement of participant.       Increase Strength and Stamina  Yes       Intervention  Provide advice, education, support and counseling about physical activity/exercise needs.       Expected Outcomes  Achievement of increased cardiorespiratory fitness and enhanced flexibility, muscular endurance and strength shown through measurements of functional capacity and personal statement of participant.       Able to understand and use rate of perceived exertion (RPE) scale  Yes       Intervention  Provide education and explanation on how to use RPE scale       Expected Outcomes  Long Term:  Able to use RPE to  guide intensity level when exercising independently       Able to understand and use Dyspnea scale  Yes       Intervention  Provide education and explanation on how to use Dyspnea scale       Expected Outcomes  Short Term: Able to use Dyspnea scale daily in rehab to express subjective sense of shortness of breath during exertion;Long Term: Able to use Dyspnea scale to guide intensity level when exercising independently       Knowledge and understanding of Target Heart Rate Range (THRR)  Yes       Intervention  Provide education and explanation of THRR including how the numbers were predicted and where they are located for reference       Expected Outcomes  Short Term: Able to state/look up THRR;Long Term: Able to use THRR to govern intensity when exercising independently;Short Term: Able to use daily as guideline for  intensity in rehab       Able to check pulse independently  Yes       Intervention  Provide education and demonstration on how to check pulse in carotid and radial arteries.;Review the importance of being able to check your own pulse for safety during independent exercise       Expected Outcomes  Long Term: Able to check pulse independently and accurately;Short Term: Able to explain why pulse checking is important during independent exercise       Understanding of Exercise Prescription  Yes       Intervention  Provide education, explanation, and written materials on patient's individual exercise prescription       Expected Outcomes  Short Term: Able to explain program exercise prescription;Long Term: Able to explain home exercise prescription to exercise independently          Exercise Goals Re-Evaluation :   Discharge Exercise Prescription (Final Exercise Prescription Changes): Exercise Prescription Changes - 12/16/16 1400      Response to Exercise   Blood Pressure (Admit)  114/62    Blood Pressure (Exercise)  116/60    Blood Pressure (Exit)  102/64    Heart Rate (Admit)  97 bpm    Heart Rate (Exercise)  134 bpm    Heart Rate (Exit)  118 bpm       Nutrition:  Target Goals: Understanding of nutrition guidelines, daily intake of sodium 1500mg , cholesterol 200mg , calories 30% from fat and 7% or less from saturated fats, daily to have 5 or more servings of fruits and vegetables.  Biometrics: Pre Biometrics - 12/16/16 1401      Pre Biometrics   Height  5\' 2"  (1.575 m)    Weight  126 lb 4.8 oz (57.3 kg)    Waist Circumference  31 inches    Hip Circumference  37.25 inches    Waist to Hip Ratio  0.83 %    BMI (Calculated)  23.09    Single Leg Stand  30 seconds        Nutrition Therapy Plan and Nutrition Goals: Nutrition Therapy & Goals - 12/16/16 1356      Intervention Plan   Intervention  Prescribe, educate and counsel regarding individualized specific dietary  modifications aiming towards targeted core components such as weight, hypertension, lipid management, diabetes, heart failure and other comorbidities.;Nutrition handout(s) given to patient.    Expected Outcomes  Short Term Goal: Understand basic principles of dietary content, such as calories, fat, sodium, cholesterol and nutrients.;Short Term Goal: A plan has been developed with personal  nutrition goals set during dietitian appointment.;Long Term Goal: Adherence to prescribed nutrition plan.       Nutrition Discharge: Rate Your Plate Scores: Nutrition Assessments - 12/16/16 1430      MEDFICTS Scores   Pre Score  10       Nutrition Goals Re-Evaluation:   Nutrition Goals Discharge (Final Nutrition Goals Re-Evaluation):   Psychosocial: Target Goals: Acknowledge presence or absence of significant depression and/or stress, maximize coping skills, provide positive support system. Participant is able to verbalize types and ability to use techniques and skills needed for reducing stress and depression.   Initial Review & Psychosocial Screening: Initial Psych Review & Screening - 12/16/16 1410      Initial Review   Current issues with  Current Stress Concerns    Source of Stress Concerns  Unable to participate in former interests or hobbies    Comments  Still tired, low energy since discharge.  "They had to do 45 minutes of CPR on me"       [place]?  Yes spouse   daughter      Barriers   Psychosocial barriers to participate in program  There are no identifiable barriers or psychosocial needs.;The patient should benefit from training in stress management and relaxation.      Screening Interventions   Interventions  Yes;Encouraged to exercise;Program counselor consult;To provide support and resources with identified psychosocial needs;Provide feedback about the scores to participant    Expected Outcomes  Short Term goal: Utilizing psychosocial counselor,  staff and physician to assist with identification of specific Stressors or current issues interfering with healing process. Setting desired goal for each stressor or current issue identified.;Long Term Goal: Stressors or current issues are controlled or eliminated.;Short Term goal: Identification and review with participant of any Quality of Life or Depression concerns found by scoring the questionnaire.;Long Term goal: The participant improves quality of Life and PHQ9 Scores as seen by post scores and/or verbalization of changes       Quality of Life Scores:  Quality of Life - 12/16/16 1413      Quality of Life Scores   Health/Function Pre  20.43 %    Socioeconomic Pre  28.21 %    Psych/Spiritual Pre  26.57 %    Family Pre  30 %    GLOBAL Pre  24.71 %       PHQ-9: Recent Review Flowsheet Data    Depression screen [facility] 2/9 12/16/2016   Decreased Interest 1   Down, Depressed, Hopeless 0   PHQ - 2 Score 1   Altered sleeping 0   Tired, decreased energy 3   Change in appetite 2   Feeling bad or failure about yourself  0   Trouble concentrating 0   Moving slowly or fidgety/restless 0   Suicidal thoughts 0   PHQ-9 Score 6   Difficult doing work/chores Somewhat difficult     Interpretation of Total Score  Total Score Depression Severity:  1-4 = Minimal depression, 5-9 = Mild depression, 10-14 = Moderate depression, 15-19 = Moderately severe depression, 20-27 = Severe depression   Psychosocial Evaluation and Intervention:   Psychosocial Re-Evaluation:   Psychosocial Discharge (Final Psychosocial Re-Evaluation):   Vocational Rehabilitation: Provide vocational rehab assistance to qualifying candidates.   Vocational Rehab Evaluation & Intervention: Vocational Rehab - 12/16/16 1417      Initial Vocational Rehab Evaluation & Intervention   Assessment shows need for Vocational Rehabilitation  No  Education: Education Goals: Education classes will be provided on a  variety of topics geared toward better understanding of heart health and risk factor modification. Participant will state understanding/return demonstration of topics presented as noted by education test scores.  Learning Barriers/Preferences: Learning Barriers/Preferences - 12/16/16 1416      Learning Barriers/Preferences   Learning Barriers  None    Learning Preferences  Verbal Instruction       Education Topics: General Nutrition Guidelines/Fats and Fiber: -Group instruction provided by verbal, written material, models and posters to present the general guidelines for heart healthy nutrition. Gives an explanation and review of dietary fats and fiber.   Controlling Sodium/Reading Food Labels: -Group verbal and written material supporting the discussion of sodium use in heart healthy nutrition. Review and explanation with models, verbal and written materials for utilization of the food label.   Exercise Physiology & Risk Factors: - Group verbal and written instruction with models to review the exercise physiology of the cardiovascular system and associated critical values. Details cardiovascular disease risk factors and the goals associated with each risk factor.   Aerobic Exercise & Resistance Training: - Gives group verbal and written discussion on the health impact of inactivity. On the components of aerobic and resistive training programs and the benefits of this training and how to safely progress through these programs.   Flexibility, Balance, General Exercise Guidelines: - Provides group verbal and written instruction on the benefits of flexibility and balance training programs. Provides general exercise guidelines with specific guidelines to those with heart or lung disease. Demonstration and skill practice provided.   Stress Management: - Provides group verbal and written instruction about the health risks of elevated stress, cause of high stress, and healthy ways to reduce  stress.   Depression: - Provides group verbal and written instruction on the correlation between heart/lung disease and depressed mood, treatment options, and the stigmas associated with seeking treatment.   Anatomy & Physiology of the Heart: - Group verbal and written instruction and models provide basic cardiac anatomy and physiology, with the coronary electrical and arterial systems. Review of: AMI, Angina, Valve disease, Heart Failure, Cardiac Arrhythmia, Pacemakers, and the ICD.   Cardiac Procedures: - Group verbal and written instruction to review commonly prescribed medications for heart disease. Reviews the medication, class of the drug, and side effects. Includes the steps to properly store meds and maintain the prescription regimen. (beta blockers and nitrates)   Cardiac Medications I: - Group verbal and written instruction to review commonly prescribed medications for heart disease. Reviews the medication, class of the drug, and side effects. Includes the steps to properly store meds and maintain the prescription regimen.   Cardiac Medications II: -Group verbal and written instruction to review commonly prescribed medications for heart disease. Reviews the medication, class of the drug, and side effects. (all other drug classes)    Go Sex-Intimacy & Heart Disease, Get SMART - Goal Setting: - Group verbal and written instruction through game format to discuss heart disease and the return to sexual intimacy. Provides group verbal and written material to discuss and apply goal setting through the application of the S.M.A.R.T. Method.   Other Matters of the Heart: - Provides group verbal, written materials and models to describe Heart Failure, Angina, Valve Disease, Peripheral Artery Disease, and Diabetes in the realm of heart disease. Includes description of the disease process and treatment options available to the cardiac patient.   Exercise & Equipment Safety: - Individual  verbal instruction and demonstration  of equipment use and safety with use of the equipment.   Cardiac Rehab from 12/16/2016 in [facility] Cardiac and Pulmonary Rehab  Date  12/16/16  Educator  SB  Instruction Review Code  1- Verbalizes Understanding      Infection Prevention: - Provides verbal and written material to individual with discussion of infection control including proper hand washing and proper equipment cleaning during exercise session.   Cardiac Rehab from 12/16/2016 in [facility] Cardiac and Pulmonary Rehab  Date  12/16/16  Educator  SB  Instruction Review Code  1- Verbalizes Understanding      Falls Prevention: - Provides verbal and written material to individual with discussion of falls prevention and safety.   Cardiac Rehab from 12/16/2016 in [facility] Cardiac and Pulmonary Rehab  Date  12/16/16  Educator  SB  Instruction Review Code  1- Verbalizes Understanding      Diabetes: - Individual verbal and written instruction to review signs/symptoms of diabetes, desired ranges of glucose level fasting, after meals and with exercise. Acknowledge that pre and post exercise glucose checks will be done for 3 sessions at entry of program.   Cardiac Rehab from 12/16/2016 in [facility] Cardiac and Pulmonary Rehab  Date  12/16/16  Educator  SB  Instruction Review Code  1- Verbalizes Understanding      Other: -Provides group and verbal instruction on various topics (see comments)    Knowledge Questionnaire Score: Knowledge Questionnaire Score - 12/16/16 1417      Knowledge Questionnaire Score   Pre Score  23/28       Core Components/Risk Factors/Patient Goals at Admission: Personal Goals and Risk Factors at Admission - 12/16/16 1408      Core Components/Risk Factors/Patient Goals on Admission    Weight Management  Yes;Weight Loss    Intervention  Weight Management: Develop a combined nutrition and exercise program designed to reach desired caloric intake, while maintaining appropriate  intake of nutrient and fiber, sodium and fats, and appropriate energy expenditure required for the weight goal.;Weight Management: Provide education and appropriate resources to help participant work on and attain dietary goals.    Admit Weight  126 lb 4.8 oz (57.3 kg)    Goal Weight: Short Term  124 lb (56.2 kg)    Goal Weight: Long Term  120 lb (54.4 kg)    Expected Outcomes  Short Term: Continue to assess and modify interventions until short term weight is achieved;Long Term: Adherence to nutrition and physical activity/exercise program aimed toward attainment of established weight goal;Weight Loss: Understanding of general recommendations for a balanced deficit meal plan, which promotes 1-2 lb weight loss per week and includes a negative energy balance of [phone] kcal/d    Tobacco Cessation  Yes    Number of packs per day  1-2 cigarettes per day Wants to quit. No quit date set yet    Intervention  Assist the participant in steps to quit. Provide individualized education and counseling about committing to Tobacco Cessation, relapse prevention, and pharmacological support that can be provided by physician.;Advice worker, assist with locating and accessing local/national Quit Smoking programs, and support quit date choice.    Expected Outcomes  Short Term: Will demonstrate readiness to quit, by selecting a quit date.;Long Term: Complete abstinence from all tobacco products for at least 12 months from quit date.;Short Term: Will quit all tobacco product use, adhering to prevention of relapse plan.    Diabetes  Yes    Intervention  Provide education about signs/symptoms and action to  take for hypo/hyperglycemia.;Provide education about proper nutrition, including hydration, and aerobic/resistive exercise prescription along with prescribed medications to achieve blood glucose in normal ranges: Fasting glucose 65-99 mg/dL    Expected Outcomes  Short Term: Participant verbalizes understanding  of the signs/symptoms and immediate care of hyper/hypoglycemia, proper foot care and importance of medication, aerobic/resistive exercise and nutrition plan for blood glucose control.;Long Term: Attainment of HbA1C < 7%.    Lipids  Yes    Intervention  Provide education and support for participant on nutrition & aerobic/resistive exercise along with prescribed medications to achieve LDL 70mg , HDL >40mg .    Expected Outcomes  Short Term: Participant states understanding of desired cholesterol values and is compliant with medications prescribed. Participant is following exercise prescription and nutrition guidelines.;Long Term: Cholesterol controlled with medications as prescribed, with individualized exercise RX and with personalized nutrition plan. Value goals: LDL < 70mg , HDL > 40 mg.       Core Components/Risk Factors/Patient Goals Review:    Core Components/Risk Factors/Patient Goals at Discharge (Final Review):    ITP Comments: ITP Comments    Row Name 12/16/16 1350 01/04/17 0615         ITP Comments  Medical review today. Initial ITP sent to Dr Loleta Chance for review, changes as needed and cosign.  Documentation of diagnosis can be found in [facility] 12/02/2016  30 day review. Continue with ITP unless directed changes per Medical Director review.          Comments:

## 2017-01-05 ENCOUNTER — Ambulatory Visit: Payer: Self-pay

## 2017-01-09 ENCOUNTER — Ambulatory Visit: Payer: Self-pay

## 2017-01-11 ENCOUNTER — Ambulatory Visit: Payer: Self-pay

## 2017-01-12 ENCOUNTER — Ambulatory Visit: Payer: Self-pay

## 2017-01-16 ENCOUNTER — Ambulatory Visit: Payer: Self-pay

## 2017-01-18 ENCOUNTER — Ambulatory Visit: Payer: Self-pay

## 2017-01-18 DIAGNOSIS — E1165 Type 2 diabetes mellitus with hyperglycemia: Secondary | ICD-10-CM | POA: Diagnosis not present

## 2017-01-19 ENCOUNTER — Ambulatory Visit: Payer: Self-pay

## 2017-01-19 ENCOUNTER — Ambulatory Visit (INDEPENDENT_AMBULATORY_CARE_PROVIDER_SITE_OTHER): Payer: Medicare HMO

## 2017-01-19 ENCOUNTER — Ambulatory Visit: Payer: Medicare HMO | Admitting: Internal Medicine

## 2017-01-19 ENCOUNTER — Other Ambulatory Visit: Payer: Self-pay

## 2017-01-19 ENCOUNTER — Encounter: Payer: Self-pay | Admitting: Internal Medicine

## 2017-01-19 VITALS — BP 108/64 | HR 71 | Ht 63.0 in | Wt 123.0 lb

## 2017-01-19 DIAGNOSIS — I3139 Other pericardial effusion (noninflammatory): Secondary | ICD-10-CM

## 2017-01-19 DIAGNOSIS — I313 Pericardial effusion (noninflammatory): Secondary | ICD-10-CM

## 2017-01-19 DIAGNOSIS — I480 Paroxysmal atrial fibrillation: Secondary | ICD-10-CM

## 2017-01-19 DIAGNOSIS — I2510 Atherosclerotic heart disease of native coronary artery without angina pectoris: Secondary | ICD-10-CM | POA: Diagnosis not present

## 2017-01-19 NOTE — Patient Instructions (Signed)
Medication Instructions:  Your physician recommends that you continue on your current medications as directed. Please refer to the Current Medication list given to you today.   Labwork: none  Testing/Procedures: none  Follow-Up: Your physician recommends that you schedule a follow-up appointment in: [place].    If you need a refill on your cardiac medications before your next appointment, please call your pharmacy.

## 2017-01-19 NOTE — Progress Notes (Signed)
Follow-up Outpatient Visit Date: 01/19/2017  Primary Care Provider: [place] [id number]  Chief Complaint: Follow-up pericardial effusion and coronary artery disease  HPI:  Ms. Shankle is a 66 y.o. year-old female with history of coronary artery disease status post remote coronary intervention at [facility] (the late 90s) and recent cardiac arrest in the setting of anaphylaxis with catheterization demonstrating severe 2 vessel CAD (subsequent atherectomy and PCI to RCA), recurrent pericardial effusion, type 1 diabetes mellitus, peripheral vascular disease with AAA, right lower lobe pneumonia with mucous plugging, and spontaneous right thigh hematoma in the setting of anticoagulation, who presents for follow-up of pericardial effusion and coronary artery disease.  Today, Ms. Tye reports that she has felt "great" for the last few weeks with the exception of one day earlier this week. She noted her heart rate to be elevated (~120 bpm), for which she took a dose of her prn diltiazem. The heart rate gradually returned to her baseline and her symptoms resolved.  She denies chest pain, shortness of breath, palpitations, and lightheadedness.  She remains compliant with aspirin and clopidogrel with bleeding.  --------------------------------------------------------------------------------------------------  Cardiovascular History & Procedures: Cardiovascular Problems:  Coronary artery disease with VF arrest and transient complete heart block in the setting of anaphylactic shock  Recurrent pericardial effusion  Paroxysmal atrial fibrillation  Risk Factors:  Known coronary artery disease, peripheral vascular disease, and type 1 diabetes mellitus  Cath/PCI:  PCI (12/02/16): Successful orbital atherectomy and PCI to heavily calcified mid RCA disease with placement of a Xience Alpine 3.0 x 33 mm drug-eluting stent.  LHC (10/11/16): Severe two-vessel coronary  artery disease including heavily calcified 95% proximal/mid LCx and sequential 90-99% mid RCA stenoses. Mild to moderate, nonobstructive disease involving the LMCA and LAD. Basal inferior hypokinesis with overall preserved LV contraction. Mildly elevated LVEDP. Peripheral vascular disease with ectasia and calcification of the infrarenal abdominal aorta and iliac arteries.  CV Surgery:  None  EP Procedures and Devices:  None  Non-Invasive Evaluation(s):  Limited TTE (01/19/17): Trivial pericardial effusion, significantly decreased in size from prior study. LVEF 55-60%.  Limited TTE (12/13/16): Normal LV size with LVEF of 65-70%.  Normal RV size and function.  Moderate to large circumferential pericardial effusion, stable to slightly decreased in size compared with 12/09/16.  No evidence of tamponade physiology.  TTE (10/25/16): Normal LV size with mild to moderate LVH. LVEF 60-65% with grade 2 diastolic dysfunction. RV size and function. Normal PA pressure. Small-to-moderate pericardial effusion.  TTE (10/12/16): Normal LV size. LVEF 55-60%. Normal diastolic function. Normal RV size and function. Normal PA pressure.  Recent CV Pertinent Labs: Lab Results  Component Value Date   CHOL 81 10/13/2016   HDL 34 (L) 10/13/2016   LDLCALC 36 10/13/2016   TRIG 56 10/13/2016   CHOLHDL 2.4 10/13/2016   INR 0.94 11/23/2016   BNP 69.0 10/25/2016   K 4.5 12/08/2016   MG 1.6 (L) 12/08/2016   BUN 15 12/08/2016   CREATININE 0.65 12/08/2016    Past medical and surgical history were reviewed and updated in EPIC.  Current Meds  Medication Sig  . ALPRAZolam (XANAX) 0.25 MG tablet Take 0.25 mg by mouth 2 (two) times daily as needed (for anxiety.).   Marland Kitchen aspirin EC 81 MG tablet Take 81 mg by mouth daily.  Marland Kitchen atorvastatin (LIPITOR) 40 MG tablet Take 20 mg by mouth at bedtime.  . clopidogrel (PLAVIX) 75 MG tablet Take 1 tablet (75 mg total) by mouth  daily.  . diltiazem (CARDIZEM CD) 240 MG 24 hr  capsule Take 1 capsule (240 mg total) by mouth daily.  Marland Kitchen diltiazem (CARDIZEM) 30 MG tablet One tablet every 6 hours as needed for heart rate >100 (Patient taking differently: Take 30 mg by mouth every 6 (six) hours as needed (for HR>100). One tablet every 6 hours as needed for heart rate >100)  . ibuprofen (ADVIL,MOTRIN) 200 MG tablet Take 400 mg by mouth daily.  . Insulin Human (INSULIN PUMP) SOLN Inject into the skin. HUMALOG 100 UNITS/ML WITH INSULIN PUMP  . insulin lispro (HUMALOG) 100 UNIT/ML cartridge Inject into the skin 3 (three) times daily with meals. INSULIN PUMP  . nitroGLYCERIN (NITROSTAT) 0.4 MG SL tablet Place 1 tablet (0.4 mg total) under the tongue every 5 (five) minutes x 3 doses as needed for chest pain.  . Vitamin D, Ergocalciferol, (DRISDOL) 50000 units CAPS capsule Take 50,000 Units once a week by mouth.    Allergies: Ceftriaxone; Rocephin [ceftriaxone sodium in dextrose]; and Albuterol  Social History   Socioeconomic History  . Marital status: Married    Spouse name: Not on file  . Number of children: Not on file  . Years of education: Not on file  . Highest education level: Not on file  Social Needs  . Financial resource strain: Not on file  . Food insecurity - worry: Not on file  . Food insecurity - inability: Not on file  . Transportation needs - medical: Not on file  . Transportation needs - non-medical: Not on file  Occupational History  . Not on file  Tobacco Use  . Smoking status: Current Some Day Smoker    Packs/day: 0.25    Years: 47.00    Pack years: 11.75    Types: Cigarettes    Start date: 01/25/1969    Last attempt to quit: 10/11/2016    Years since quitting: 0.2  . Smokeless tobacco: Never Used  . Tobacco comment: 12/16/2016 Wants to quit, has not set quit date.  Has been informed about resources available for smoking cessation  Substance and Sexual Activity  . Alcohol use: Yes    Alcohol/week: 6.0 oz    Types: 10 Glasses of wine per week    . Drug use: No  . Sexual activity: Not Currently  Other Topics Concern  . Not on file  Social History Narrative   [facility] (10/14/16):   Patient has primarily done office work. Married for approximately 1 year. Has a dog at home but no bird exposure. No mold exposure.    Family History  Problem Relation Age of Onset  . Hypertension Mother   . Aortic aneurysm Father   . Hypertension Brother   . Lung disease Neg Hx   . Rheumatologic disease Neg Hx     Review of Systems: A 12-system review of systems was performed and was negative except as noted in the HPI.  --------------------------------------------------------------------------------------------------  Physical Exam: BP 108/64 (BP Location: Left Arm, Patient Position: Sitting, Cuff Size: Normal)   Pulse 71   Ht 5\' 3"  (1.6 m)   Wt 123 lb (55.8 kg)   BMI 21.79 kg/m   General:  Thin woman, seated comfortably in the exam room. She is accompanied by her husband. HEENT: No conjunctival pallor or scleral icterus. Moist mucous membranes.  OP clear. Neck: Supple without lymphadenopathy, thyromegaly, JVD, or HJR. Lungs: Normal work of breathing. Clear to auscultation bilaterally without wheezes or crackles. Heart: Regular rate and rhythm without murmurs,  rubs, or gallops. Non-displaced PMI. Abd: Bowel sounds present. Soft, NT/ND without hepatosplenomegaly Ext: No lower extremity edema. Radial, PT, and DP pulses are 2+ bilaterally. Skin: Warm and dry without rash.  EKG:  NSR with low voltage and non-specific T-wave abnormality.  Lab Results  Component Value Date   WBC 12.8 (H) 12/08/2016   HGB 11.9 (L) 12/08/2016   HCT 35.9 12/08/2016   MCV 82.8 12/08/2016   PLT 473 (H) 12/08/2016    Lab Results  Component Value Date   NA 133 (L) 12/08/2016   K 4.5 12/08/2016   CL 99 (L) 12/08/2016   CO2 24 12/08/2016   BUN 15 12/08/2016   CREATININE 0.65 12/08/2016   GLUCOSE 191 (H) 12/08/2016   ALT 41 10/26/2016     Lab Results  Component Value Date   CHOL 81 10/13/2016   HDL 34 (L) 10/13/2016   LDLCALC 36 10/13/2016   TRIG 56 10/13/2016   CHOLHDL 2.4 10/13/2016    --------------------------------------------------------------------------------------------------  ASSESSMENT AND PLAN: Coronary artery disease without angina No chest pain or other worrisome symptoms despite significant unrevascularized lesion involving he LCx. Given her complicated clinical course since her initial presentation with cardiac arrest in September, we have agreed to continue with medical therapy and defer PCI at this time. Intervention to the LCx would require atherectomy, which may be challenging due to angulation of the LCx. We will continue with indefinite DAPT with aspirin and clopidogrel, as well as atorvastatin 20 mg daily.  Pericardial effusion Limited echo today shows near complete resolution of pericardial effusion. I presume it was caused by microperforation during PCI to the RCA in October. No further workup is indicated at this time.  Paroxysmal atrial fibrillation Ms. Kissel was noted to have a-fib while hospitalized after cardiac arrest in September and again while undergoing PCI to the RCA in October. She is in sinus rhythm today. I wonder if her episode of elevated heart rate and generalized malaise within the last week was an episode of PAF. We discussed event monitor placement but have agreed to defer at this time. In light of ongoing DAPT and spontaneous right thigh hematoma in September, I would be hesitant to treat her with triple therapy or even a NOAC + clopidogrel for the time being (though she certainly warrants anticoagulation if PAF persists, with a CHADSVASC score of at least 3).  Follow-up: Return to clinic in 6 weeks.  Nelva Bush, MD 01/19/2017 3:15 PM

## 2017-01-20 ENCOUNTER — Encounter: Payer: Self-pay | Admitting: Internal Medicine

## 2017-01-20 DIAGNOSIS — I313 Pericardial effusion (noninflammatory): Secondary | ICD-10-CM | POA: Insufficient documentation

## 2017-01-20 DIAGNOSIS — I3139 Other pericardial effusion (noninflammatory): Secondary | ICD-10-CM | POA: Insufficient documentation

## 2017-01-23 ENCOUNTER — Ambulatory Visit: Payer: Self-pay

## 2017-01-25 ENCOUNTER — Telehealth: Payer: Self-pay

## 2017-01-25 ENCOUNTER — Encounter: Payer: Self-pay | Admitting: *Deleted

## 2017-01-25 ENCOUNTER — Ambulatory Visit: Payer: Self-pay

## 2017-01-25 NOTE — Telephone Encounter (Signed)
Lisa [name] siad her Dr recommends she hold off for now on Rehab and she may start in Feb.  She said its ok to discharge her now and she can restart later.

## 2017-01-25 NOTE — Progress Notes (Signed)
Cardiac Individual Treatment Plan  Patient Details  Name: Lisa [name] MRN: [id number] Date of Birth: Nov 08, 1950 Referring Provider:     Cardiac Rehab from 12/16/2016 in [facility] Cardiac and Pulmonary Rehab  Referring Provider  End      Initial Encounter Date:    Cardiac Rehab from 12/16/2016 in [facility] Cardiac and Pulmonary Rehab  Date  12/16/16  Referring Provider  End      Visit Diagnosis: No diagnosis found.  Patient's Home Medications on Admission:  Current Outpatient Medications:  .  ALPRAZolam (XANAX) 0.25 MG tablet, Take 0.25 mg by mouth 2 (two) times daily as needed (for anxiety.). , Disp: , Rfl:  .  aspirin EC 81 MG tablet, Take 81 mg by mouth daily., Disp: , Rfl:  .  atorvastatin (LIPITOR) 40 MG tablet, Take 20 mg by mouth at bedtime., Disp: , Rfl:  .  clopidogrel (PLAVIX) 75 MG tablet, Take 1 tablet (75 mg total) by mouth daily., Disp: 90 tablet, Rfl: 3 .  diltiazem (CARDIZEM CD) 240 MG 24 hr capsule, Take 1 capsule (240 mg total) by mouth daily., Disp: 90 capsule, Rfl: 3 .  diltiazem (CARDIZEM) 30 MG tablet, One tablet every 6 hours as needed for heart rate >100 (Patient taking differently: Take 30 mg by mouth every 6 (six) hours as needed (for HR>100). One tablet every 6 hours as needed for heart rate >100), Disp: 30 tablet, Rfl: 5 .  ibuprofen (ADVIL,MOTRIN) 200 MG tablet, Take 400 mg by mouth daily., Disp: , Rfl:  .  Insulin Human (INSULIN PUMP) SOLN, Inject into the skin. HUMALOG 100 UNITS/ML WITH INSULIN PUMP, Disp: , Rfl:  .  insulin lispro (HUMALOG) 100 UNIT/ML cartridge, Inject into the skin 3 (three) times daily with meals. INSULIN PUMP, Disp: , Rfl:  .  nitroGLYCERIN (NITROSTAT) 0.4 MG SL tablet, Place 1 tablet (0.4 mg total) under the tongue every 5 (five) minutes x 3 doses as needed for chest pain., Disp: 25 tablet, Rfl: 12 .  Vitamin D, Ergocalciferol, (DRISDOL) 50000 units CAPS capsule, Take 50,000 Units once a week by mouth., Disp: , Rfl:   Past Medical  History: Past Medical History:  Diagnosis Date  . CAD (coronary artery disease)    a. 1999 PTCA @ [facility]; b. NSTEMI/Cath 10/11/2016: LM 20%, oLAD 40%, p-mLAD 40%, dLAD 40%, p-mLCx 95%, pRCA 30%, EF 55-65%; c. 10/2016 CT Surgery->rec PCI due to poor lung fxn. d. 12/02/16 DES to mid RCA.  . Groin hematoma    a. 10/2016 Spont groin hematoma w/ anemia req 1u prbcs.  . High cholesterol   . History of kidney stones   . Mucus plugging of bronchi    a. 10/2016 RLL Bronchus obstruction->bronchus vs mass;  b. 10/2016 CT Chest: resolution of RLL mucous plug.  . NSTEMI (non-ST elevated myocardial infarction) ([place]) 10/11/2016  . PAF (paroxysmal atrial fibrillation) ([place])    a. 12/2016 Noted during diag cath--CHA2DS2VASc = 3. No OAC in setting of need for DAPT and h/o spont Thigh Hematoma in 10/2016.  Marland Kitchen Pericardial effusion    a. 10/2016 Echo: EF 60-65%, no rwma, Gr2 DD, small to mod pericardial eff. No tamponade; b. 12/2016 Echo: EF 60-65%, no rwma, Gr1 DD, 1.2 to 1.4 cm mod to large circumferential pericardial effusion w/o tamponade.  . Pneumonia 02/2016   "couple times before 02/2016 too" (12/02/2016)  . PVD (peripheral vascular disease) ([place])    a. Abdominal aortogram 10/11/16: Infrarenal abdominal aorta heavily calcified & ectatic with approximately 40-50% stenosis at  the bifurcation. Mild diffuse disease w/ heavy calcification noted in the common & external iliac arteries bilaterally  . Type I diabetes mellitus ([place])    a. On insulin pump.(12/02/2016)  . Ventricular fibrillation ([place])    a. 10/11/2016: Felt to be secondary to demand ischemia in the setting of underlying CAD and anaphylactic reaction from IV Rocephin; b. 10/2016 Echo: EF 55-60%, ? mild inf HK; c. 10/2016 Seen by EP: No ICD indication.    Tobacco Use: Social History   Tobacco Use  Smoking Status Current Some Day Smoker  . Packs/day: 0.25  . Years: 47.00  . Pack years: 11.75  . Types: Cigarettes  . Start date: 01/25/1969  . Last attempt to  quit: 10/11/2016  . Years since quitting: 0.2  Smokeless Tobacco Never Used  Tobacco Comment   12/16/2016 Wants to quit, has not set quit date.  Has been informed about resources available for smoking cessation    Labs: Recent Review Flowsheet Data    Labs for ITP Cardiac and Pulmonary Rehab Latest Ref Rng & Units 10/11/2016 10/12/2016 10/13/2016 10/15/2016   Cholestrol 0 - 200 mg/dL - - 81 -   LDLCALC 0 - 99 mg/dL - - 36 -   HDL >40 mg/dL - - 34(L) -   Trlycerides <150 mg/dL 66 - 56 -   Hemoglobin A1c 4.8 - 5.6 % - 7.0(H) - 7.0(H)   PHART 7.350 - 7.450 7.23(L) - - 7.349(L)   PCO2ART 32.0 - 48.0 mmHg 36 - - 52.4(H)   HCO3 20.0 - 28.0 mmol/L 14.7(L) - - 28.1(H)   ACIDBASEDEF 0.0 - 2.0 mmol/L 12.1(H) - - -   O2SAT % 98.3 - - 98.1       Exercise Target Goals:    Exercise Program Goal: Individual exercise prescription set with THRR, safety & activity barriers. Participant demonstrates ability to understand and report RPE using BORG scale, to self-measure pulse accurately, and to acknowledge the importance of the exercise prescription.  Exercise Prescription Goal: Starting with aerobic activity 30 plus minutes a day, 3 days per week for initial exercise prescription. Provide home exercise prescription and guidelines that participant acknowledges understanding prior to discharge.  Activity Barriers & Risk Stratification: Activity Barriers & Cardiac Risk Stratification - 12/16/16 1356      Activity Barriers & Cardiac Risk Stratification   Activity Barriers  None    Cardiac Risk Stratification  High       6 Minute Walk: 6 Minute Walk    Row Name 12/16/16 1402         6 Minute Walk   Distance  990 feet     Walk Time  6 minutes     # of Rest Breaks  0     MPH  1.88     METS  3     VO2 Peak  10.48     Resting HR  97 bpm     Resting BP  114/62     Resting Oxygen Saturation   98 %     Max Ex. HR  135 bpm     Max Ex. BP  116/60     2 Minute Post BP  102/64        Oxygen  Initial Assessment:   Oxygen Re-Evaluation:   Oxygen Discharge (Final Oxygen Re-Evaluation):   Initial Exercise Prescription: Initial Exercise Prescription - 12/16/16 1400      Date of Initial Exercise RX and Referring Provider   Date  12/16/16  Referring Provider  End      Treadmill   MPH  1.8    Grade  2    Minutes  15    METs  2.87      NuStep   Level  2    SPM  80    Minutes  15    METs  2.8      REL-XR   Level  2    Speed  50    Minutes  15    METs  2.8      Prescription Details   Frequency (times per week)  3    Duration  Progress to 45 minutes of aerobic exercise without signs/symptoms of physical distress      Intensity   THRR 40-80% of Max Heartrate  120-144    Ratings of Perceived Exertion  11-13    Perceived Dyspnea  0-4      Resistance Training   Training Prescription  Yes    Weight  2 lb    Reps  10-15       Perform Capillary Blood Glucose checks as needed.  Exercise Prescription Changes: Exercise Prescription Changes    Row Name 12/16/16 1400             Response to Exercise   Blood Pressure (Admit)  114/62       Blood Pressure (Exercise)  116/60       Blood Pressure (Exit)  102/64       Heart Rate (Admit)  97 bpm       Heart Rate (Exercise)  134 bpm       Heart Rate (Exit)  118 bpm          Exercise Comments:   Exercise Goals and Review: Exercise Goals    Row Name 12/16/16 1401             Exercise Goals   Increase Physical Activity  Yes       Intervention  Provide advice, education, support and counseling about physical activity/exercise needs.;Develop an individualized exercise prescription for aerobic and resistive training based on initial evaluation findings, risk stratification, comorbidities and participant's personal goals.       Expected Outcomes  Achievement of increased cardiorespiratory fitness and enhanced flexibility, muscular endurance and strength shown through measurements of functional capacity and  personal statement of participant.       Increase Strength and Stamina  Yes       Intervention  Provide advice, education, support and counseling about physical activity/exercise needs.       Expected Outcomes  Achievement of increased cardiorespiratory fitness and enhanced flexibility, muscular endurance and strength shown through measurements of functional capacity and personal statement of participant.       Able to understand and use rate of perceived exertion (RPE) scale  Yes       Intervention  Provide education and explanation on how to use RPE scale       Expected Outcomes  Long Term:  Able to use RPE to guide intensity level when exercising independently       Able to understand and use Dyspnea scale  Yes       Intervention  Provide education and explanation on how to use Dyspnea scale       Expected Outcomes  Short Term: Able to use Dyspnea scale daily in rehab to express subjective sense of shortness of breath during exertion;Long Term: Able to use Dyspnea scale to guide intensity level  when exercising independently       Knowledge and understanding of Target Heart Rate Range (THRR)  Yes       Intervention  Provide education and explanation of THRR including how the numbers were predicted and where they are located for reference       Expected Outcomes  Short Term: Able to state/look up THRR;Long Term: Able to use THRR to govern intensity when exercising independently;Short Term: Able to use daily as guideline for intensity in rehab       Able to check pulse independently  Yes       Intervention  Provide education and demonstration on how to check pulse in carotid and radial arteries.;Review the importance of being able to check your own pulse for safety during independent exercise       Expected Outcomes  Long Term: Able to check pulse independently and accurately;Short Term: Able to explain why pulse checking is important during independent exercise       Understanding of Exercise  Prescription  Yes       Intervention  Provide education, explanation, and written materials on patient's individual exercise prescription       Expected Outcomes  Short Term: Able to explain program exercise prescription;Long Term: Able to explain home exercise prescription to exercise independently          Exercise Goals Re-Evaluation :   Discharge Exercise Prescription (Final Exercise Prescription Changes): Exercise Prescription Changes - 12/16/16 1400      Response to Exercise   Blood Pressure (Admit)  114/62    Blood Pressure (Exercise)  116/60    Blood Pressure (Exit)  102/64    Heart Rate (Admit)  97 bpm    Heart Rate (Exercise)  134 bpm    Heart Rate (Exit)  118 bpm       Nutrition:  Target Goals: Understanding of nutrition guidelines, daily intake of sodium 1500mg , cholesterol 200mg , calories 30% from fat and 7% or less from saturated fats, daily to have 5 or more servings of fruits and vegetables.  Biometrics: Pre Biometrics - 12/16/16 1401      Pre Biometrics   Height  5\' 2"  (1.575 m)    Weight  126 lb 4.8 oz (57.3 kg)    Waist Circumference  31 inches    Hip Circumference  37.25 inches    Waist to Hip Ratio  0.83 %    BMI (Calculated)  23.09    Single Leg Stand  30 seconds        Nutrition Therapy Plan and Nutrition Goals: Nutrition Therapy & Goals - 12/16/16 1356      Intervention Plan   Intervention  Prescribe, educate and counsel regarding individualized specific dietary modifications aiming towards targeted core components such as weight, hypertension, lipid management, diabetes, heart failure and other comorbidities.;Nutrition handout(s) given to patient.    Expected Outcomes  Short Term Goal: Understand basic principles of dietary content, such as calories, fat, sodium, cholesterol and nutrients.;Short Term Goal: A plan has been developed with personal nutrition goals set during dietitian appointment.;Long Term Goal: Adherence to prescribed nutrition  plan.       Nutrition Discharge: Rate Your Plate Scores: Nutrition Assessments - 12/16/16 1430      MEDFICTS Scores   Pre Score  10       Nutrition Goals Re-Evaluation:   Nutrition Goals Discharge (Final Nutrition Goals Re-Evaluation):   Psychosocial: Target Goals: Acknowledge presence or absence of significant depression and/or stress, maximize coping skills,  provide positive support system. Participant is able to verbalize types and ability to use techniques and skills needed for reducing stress and depression.   Initial Review & Psychosocial Screening: Initial Psych Review & Screening - 12/16/16 1410      Initial Review   Current issues with  Current Stress Concerns    Source of Stress Concerns  Unable to participate in former interests or hobbies    Comments  Still tired, low energy since discharge.  "They had to do 45 minutes of CPR on me"       [place]?  Yes spouse   daughter      Barriers   Psychosocial barriers to participate in program  There are no identifiable barriers or psychosocial needs.;The patient should benefit from training in stress management and relaxation.      Screening Interventions   Interventions  Yes;Encouraged to exercise;Program counselor consult;To provide support and resources with identified psychosocial needs;Provide feedback about the scores to participant    Expected Outcomes  Short Term goal: Utilizing psychosocial counselor, staff and physician to assist with identification of specific Stressors or current issues interfering with healing process. Setting desired goal for each stressor or current issue identified.;Long Term Goal: Stressors or current issues are controlled or eliminated.;Short Term goal: Identification and review with participant of any Quality of Life or Depression concerns found by scoring the questionnaire.;Long Term goal: The participant improves quality of Life and PHQ9 Scores as seen by post  scores and/or verbalization of changes       Quality of Life Scores:  Quality of Life - 12/16/16 1413      Quality of Life Scores   Health/Function Pre  20.43 %    Socioeconomic Pre  28.21 %    Psych/Spiritual Pre  26.57 %    Family Pre  30 %    GLOBAL Pre  24.71 %       PHQ-9: Recent Review Flowsheet Data    Depression screen [facility] 2/9 12/16/2016   Decreased Interest 1   Down, Depressed, Hopeless 0   PHQ - 2 Score 1   Altered sleeping 0   Tired, decreased energy 3   Change in appetite 2   Feeling bad or failure about yourself  0   Trouble concentrating 0   Moving slowly or fidgety/restless 0   Suicidal thoughts 0   PHQ-9 Score 6   Difficult doing work/chores Somewhat difficult     Interpretation of Total Score  Total Score Depression Severity:  1-4 = Minimal depression, 5-9 = Mild depression, 10-14 = Moderate depression, 15-19 = Moderately severe depression, 20-27 = Severe depression   Psychosocial Evaluation and Intervention:   Psychosocial Re-Evaluation:   Psychosocial Discharge (Final Psychosocial Re-Evaluation):   Vocational Rehabilitation: Provide vocational rehab assistance to qualifying candidates.   Vocational Rehab Evaluation & Intervention: Vocational Rehab - 12/16/16 1417      Initial Vocational Rehab Evaluation & Intervention   Assessment shows need for Vocational Rehabilitation  No       Education: Education Goals: Education classes will be provided on a variety of topics geared toward better understanding of heart health and risk factor modification. Participant will state understanding/return demonstration of topics presented as noted by education test scores.  Learning Barriers/Preferences: Learning Barriers/Preferences - 12/16/16 1416      Learning Barriers/Preferences   Learning Barriers  None    Learning Preferences  Verbal Instruction       Education Topics: General  Nutrition Guidelines/Fats and Fiber: -Group instruction  provided by verbal, written material, models and posters to present the general guidelines for heart healthy nutrition. Gives an explanation and review of dietary fats and fiber.   Controlling Sodium/Reading Food Labels: -Group verbal and written material supporting the discussion of sodium use in heart healthy nutrition. Review and explanation with models, verbal and written materials for utilization of the food label.   Exercise Physiology & Risk Factors: - Group verbal and written instruction with models to review the exercise physiology of the cardiovascular system and associated critical values. Details cardiovascular disease risk factors and the goals associated with each risk factor.   Aerobic Exercise & Resistance Training: - Gives group verbal and written discussion on the health impact of inactivity. On the components of aerobic and resistive training programs and the benefits of this training and how to safely progress through these programs.   Flexibility, Balance, General Exercise Guidelines: - Provides group verbal and written instruction on the benefits of flexibility and balance training programs. Provides general exercise guidelines with specific guidelines to those with heart or lung disease. Demonstration and skill practice provided.   Stress Management: - Provides group verbal and written instruction about the health risks of elevated stress, cause of high stress, and healthy ways to reduce stress.   Depression: - Provides group verbal and written instruction on the correlation between heart/lung disease and depressed mood, treatment options, and the stigmas associated with seeking treatment.   Anatomy & Physiology of the Heart: - Group verbal and written instruction and models provide basic cardiac anatomy and physiology, with the coronary electrical and arterial systems. Review of: AMI, Angina, Valve disease, Heart Failure, Cardiac Arrhythmia, Pacemakers, and the  ICD.   Cardiac Procedures: - Group verbal and written instruction to review commonly prescribed medications for heart disease. Reviews the medication, class of the drug, and side effects. Includes the steps to properly store meds and maintain the prescription regimen. (beta blockers and nitrates)   Cardiac Medications I: - Group verbal and written instruction to review commonly prescribed medications for heart disease. Reviews the medication, class of the drug, and side effects. Includes the steps to properly store meds and maintain the prescription regimen.   Cardiac Medications II: -Group verbal and written instruction to review commonly prescribed medications for heart disease. Reviews the medication, class of the drug, and side effects. (all other drug classes)    Go Sex-Intimacy & Heart Disease, Get SMART - Goal Setting: - Group verbal and written instruction through game format to discuss heart disease and the return to sexual intimacy. Provides group verbal and written material to discuss and apply goal setting through the application of the S.M.A.R.T. Method.   Other Matters of the Heart: - Provides group verbal, written materials and models to describe Heart Failure, Angina, Valve Disease, Peripheral Artery Disease, and Diabetes in the realm of heart disease. Includes description of the disease process and treatment options available to the cardiac patient.   Exercise & Equipment Safety: - Individual verbal instruction and demonstration of equipment use and safety with use of the equipment.   Cardiac Rehab from 12/16/2016 in [facility] Cardiac and Pulmonary Rehab  Date  12/16/16  Educator  SB  Instruction Review Code  1- Verbalizes Understanding      Infection Prevention: - Provides verbal and written material to individual with discussion of infection control including proper hand washing and proper equipment cleaning during exercise session.   Cardiac Rehab from 12/16/2016 in  [place] Cardiac and Pulmonary Rehab  Date  12/16/16  Educator  SB  Instruction Review Code  1- Verbalizes Understanding      Falls Prevention: - Provides verbal and written material to individual with discussion of falls prevention and safety.   Cardiac Rehab from 12/16/2016 in [facility] Cardiac and Pulmonary Rehab  Date  12/16/16  Educator  SB  Instruction Review Code  1- Verbalizes Understanding      Diabetes: - Individual verbal and written instruction to review signs/symptoms of diabetes, desired ranges of glucose level fasting, after meals and with exercise. Acknowledge that pre and post exercise glucose checks will be done for 3 sessions at entry of program.   Cardiac Rehab from 12/16/2016 in [facility] Cardiac and Pulmonary Rehab  Date  12/16/16  Educator  SB  Instruction Review Code  1- Verbalizes Understanding      Other: -Provides group and verbal instruction on various topics (see comments)    Knowledge Questionnaire Score: Knowledge Questionnaire Score - 12/16/16 1417      Knowledge Questionnaire Score   Pre Score  23/28       Core Components/Risk Factors/Patient Goals at Admission: Personal Goals and Risk Factors at Admission - 12/16/16 1408      Core Components/Risk Factors/Patient Goals on Admission    Weight Management  Yes;Weight Loss    Intervention  Weight Management: Develop a combined nutrition and exercise program designed to reach desired caloric intake, while maintaining appropriate intake of nutrient and fiber, sodium and fats, and appropriate energy expenditure required for the weight goal.;Weight Management: Provide education and appropriate resources to help participant work on and attain dietary goals.    Admit Weight  126 lb 4.8 oz (57.3 kg)    Goal Weight: Short Term  124 lb (56.2 kg)    Goal Weight: Long Term  120 lb (54.4 kg)    Expected Outcomes  Short Term: Continue to assess and modify interventions until short term weight is achieved;Long Term:  Adherence to nutrition and physical activity/exercise program aimed toward attainment of established weight goal;Weight Loss: Understanding of general recommendations for a balanced deficit meal plan, which promotes 1-2 lb weight loss per week and includes a negative energy balance of [phone] kcal/d    Tobacco Cessation  Yes    Number of packs per day  1-2 cigarettes per day Wants to quit. No quit date set yet    Intervention  Assist the participant in steps to quit. Provide individualized education and counseling about committing to Tobacco Cessation, relapse prevention, and pharmacological support that can be provided by physician.;Advice worker, assist with locating and accessing local/national Quit Smoking programs, and support quit date choice.    Expected Outcomes  Short Term: Will demonstrate readiness to quit, by selecting a quit date.;Long Term: Complete abstinence from all tobacco products for at least 12 months from quit date.;Short Term: Will quit all tobacco product use, adhering to prevention of relapse plan.    Diabetes  Yes    Intervention  Provide education about signs/symptoms and action to take for hypo/hyperglycemia.;Provide education about proper nutrition, including hydration, and aerobic/resistive exercise prescription along with prescribed medications to achieve blood glucose in normal ranges: Fasting glucose 65-99 mg/dL    Expected Outcomes  Short Term: Participant verbalizes understanding of the signs/symptoms and immediate care of hyper/hypoglycemia, proper foot care and importance of medication, aerobic/resistive exercise and nutrition plan for blood glucose control.;Long Term: Attainment of HbA1C < 7%.    Lipids  Yes  Intervention  Provide education and support for participant on nutrition & aerobic/resistive exercise along with prescribed medications to achieve LDL 70mg , HDL >40mg .    Expected Outcomes  Short Term: Participant states understanding of  desired cholesterol values and is compliant with medications prescribed. Participant is following exercise prescription and nutrition guidelines.;Long Term: Cholesterol controlled with medications as prescribed, with individualized exercise RX and with personalized nutrition plan. Value goals: LDL < 70mg , HDL > 40 mg.       Core Components/Risk Factors/Patient Goals Review:    Core Components/Risk Factors/Patient Goals at Discharge (Final Review):    ITP Comments: ITP Comments    Row Name 12/16/16 1350 01/04/17 0615         ITP Comments  Medical review today. Initial ITP sent to Dr Loleta Chance for review, changes as needed and cosign.  Documentation of diagnosis can be found in [facility] 12/02/2016  30 day review. Continue with ITP unless directed changes per Medical Director review.          Comments: Only attended Med Review, per doctor's request not able to start until later. Will discharge now in hopes she will attend in a few months.

## 2017-01-25 NOTE — Progress Notes (Signed)
Discharge Progress Report  Patient Details  Name: Lisa [name] MRN: [id number] Date of Birth: February 14, 1950 Referring Provider:     Cardiac Rehab from 12/16/2016 in [facility]  Referring Provider  End       Number of Visits: 0  Reason for Discharge:  Early Exit:  Personal, per doctor's recommendation  Smoking History:  Social History   Tobacco Use  Smoking Status Current Some Day Smoker  . Packs/day: 0.25  . Years: 47.00  . Pack years: 11.75  . Types: Cigarettes  . Start date: 01/25/1969  . Last attempt to quit: 10/11/2016  . Years since quitting: 0.2  Smokeless Tobacco Never Used  Tobacco Comment   12/16/2016 Wants to quit, has not set quit date.  Has been informed about resources available for smoking cessation    Diagnosis:  No diagnosis found.  ADL [facility]:   Initial Exercise Prescription: Initial Exercise Prescription - 12/16/16 1400      Date of Initial Exercise RX and Referring Provider   Date  12/16/16    Referring Provider  End      Treadmill   MPH  1.8    Grade  2    Minutes  15    METs  2.87      NuStep   Level  2    SPM  80    Minutes  15    METs  2.8      REL-XR   Level  2    Speed  50    Minutes  15    METs  2.8      Prescription Details   Frequency (times per week)  3    Duration  Progress to 45 minutes of aerobic exercise without signs/symptoms of physical distress      Intensity   THRR 40-80% of Max Heartrate  120-144    Ratings of Perceived Exertion  11-13    Perceived Dyspnea  0-4      Resistance Training   Training Prescription  Yes    Weight  2 lb    Reps  10-15       Discharge Exercise Prescription (Final Exercise Prescription Changes): Exercise Prescription Changes - 12/16/16 1400      Response to Exercise   Blood Pressure (Admit)  114/62    Blood Pressure (Exercise)  116/60    Blood Pressure (Exit)  102/64    Heart Rate (Admit)  97 bpm    Heart Rate (Exercise)  134 bpm    Heart Rate  (Exit)  118 bpm       Functional Capacity: 6 Minute Walk    Row Name 12/16/16 1402         6 Minute Walk   Distance  990 feet     Walk Time  6 minutes     # of Rest Breaks  0     MPH  1.88     METS  3     VO2 Peak  10.48     Resting HR  97 bpm     Resting BP  114/62     Resting Oxygen Saturation   98 %     Max Ex. HR  135 bpm     Max Ex. BP  116/60     2 Minute Post BP  102/64        Psychological, QOL, Others - Outcomes: PHQ 2/9: Depression screen PHQ 2/9 12/16/2016  Decreased Interest 1  Down,  Depressed, Hopeless 0  PHQ - 2 Score 1  Altered sleeping 0  Tired, decreased energy 3  Change in appetite 2  Feeling bad or failure about yourself  0  Trouble concentrating 0  Moving slowly or fidgety/restless 0  Suicidal thoughts 0  PHQ-9 Score 6  Difficult doing work/chores Somewhat difficult    Quality of Life: Quality of Life - 12/16/16 1413      Quality of Life Scores   Health/Function Pre  20.43 %    Socioeconomic Pre  28.21 %    Psych/Spiritual Pre  26.57 %    Family Pre  30 %    GLOBAL Pre  24.71 %       Personal Goals: Goals established at orientation with interventions provided to work toward goal. Personal Goals and Risk Factors at Admission - 12/16/16 1408      Core Components/Risk Factors/Patient Goals on Admission    Weight Management  Yes;Weight Loss    Intervention  Weight Management: Develop a combined nutrition and exercise program designed to reach desired caloric intake, while maintaining appropriate intake of nutrient and fiber, sodium and fats, and appropriate energy expenditure required for the weight goal.;Weight Management: Provide education and appropriate resources to help participant work on and attain dietary goals.    Admit Weight  126 lb 4.8 oz (57.3 kg)    Goal Weight: Short Term  124 lb (56.2 kg)    Goal Weight: Long Term  120 lb (54.4 kg)    Expected Outcomes  Short Term: Continue to assess and modify interventions until short  term weight is achieved;Long Term: Adherence to nutrition and physical activity/exercise program aimed toward attainment of established weight goal;Weight Loss: Understanding of general recommendations for a balanced deficit meal plan, which promotes 1-2 lb weight loss per week and includes a negative energy balance of [phone] kcal/d    Tobacco Cessation  Yes    Number of packs per day  1-2 cigarettes per day Wants to quit. No quit date set yet    Intervention  Assist the participant in steps to quit. Provide individualized education and counseling about committing to Tobacco Cessation, relapse prevention, and pharmacological support that can be provided by physician.;Advice worker, assist with locating and accessing local/national Quit Smoking programs, and support quit date choice.    Expected Outcomes  Short Term: Will demonstrate readiness to quit, by selecting a quit date.;Long Term: Complete abstinence from all tobacco products for at least 12 months from quit date.;Short Term: Will quit all tobacco product use, adhering to prevention of relapse plan.    Diabetes  Yes    Intervention  Provide education about signs/symptoms and action to take for hypo/hyperglycemia.;Provide education about proper nutrition, including hydration, and aerobic/resistive exercise prescription along with prescribed medications to achieve blood glucose in normal ranges: Fasting glucose 65-99 mg/dL    Expected Outcomes  Short Term: Participant verbalizes understanding of the signs/symptoms and immediate care of hyper/hypoglycemia, proper foot care and importance of medication, aerobic/resistive exercise and nutrition plan for blood glucose control.;Long Term: Attainment of HbA1C < 7%.    Lipids  Yes    Intervention  Provide education and support for participant on nutrition & aerobic/resistive exercise along with prescribed medications to achieve LDL 70mg , HDL >40mg .    Expected Outcomes  Short Term:  Participant states understanding of desired cholesterol values and is compliant with medications prescribed. Participant is following exercise prescription and nutrition guidelines.;Long Term: Cholesterol controlled with medications as prescribed, with individualized  exercise RX and with personalized nutrition plan. Value goals: LDL < 70mg , HDL > 40 mg.        Personal Goals Discharge:   Exercise Goals and Review: Exercise Goals    Row Name 12/16/16 1401             Exercise Goals   Increase Physical Activity  Yes       Intervention  Provide advice, education, support and counseling about physical activity/exercise needs.;Develop an individualized exercise prescription for aerobic and resistive training based on initial evaluation findings, risk stratification, comorbidities and participant's personal goals.       Expected Outcomes  Achievement of increased cardiorespiratory fitness and enhanced flexibility, muscular endurance and strength shown through measurements of functional capacity and personal statement of participant.       Increase Strength and Stamina  Yes       Intervention  Provide advice, education, support and counseling about physical activity/exercise needs.       Expected Outcomes  Achievement of increased cardiorespiratory fitness and enhanced flexibility, muscular endurance and strength shown through measurements of functional capacity and personal statement of participant.       Able to understand and use rate of perceived exertion (RPE) scale  Yes       Intervention  Provide education and explanation on how to use RPE scale       Expected Outcomes  Long Term:  Able to use RPE to guide intensity level when exercising independently       Able to understand and use Dyspnea scale  Yes       Intervention  Provide education and explanation on how to use Dyspnea scale       Expected Outcomes  Short Term: Able to use Dyspnea scale daily in rehab to express subjective sense of  shortness of breath during exertion;Long Term: Able to use Dyspnea scale to guide intensity level when exercising independently       Knowledge and understanding of Target Heart Rate Range (THRR)  Yes       Intervention  Provide education and explanation of THRR including how the numbers were predicted and where they are located for reference       Expected Outcomes  Short Term: Able to state/look up THRR;Long Term: Able to use THRR to govern intensity when exercising independently;Short Term: Able to use daily as guideline for intensity in rehab       Able to check pulse independently  Yes       Intervention  Provide education and demonstration on how to check pulse in carotid and radial arteries.;Review the importance of being able to check your own pulse for safety during independent exercise       Expected Outcomes  Long Term: Able to check pulse independently and accurately;Short Term: Able to explain why pulse checking is important during independent exercise       Understanding of Exercise Prescription  Yes       Intervention  Provide education, explanation, and written materials on patient's individual exercise prescription       Expected Outcomes  Short Term: Able to explain program exercise prescription;Long Term: Able to explain home exercise prescription to exercise independently          Nutrition & Weight - Outcomes: Pre Biometrics - 12/16/16 1401      Pre Biometrics   Height  5\' 2"  (1.575 m)    Weight  126 lb 4.8 oz (57.3 kg)    Waist  Circumference  31 inches    Hip Circumference  37.25 inches    Waist to Hip Ratio  0.83 %    BMI (Calculated)  23.09    Single Leg Stand  30 seconds        Nutrition: Nutrition Therapy & Goals - 12/16/16 1356      Intervention Plan   Intervention  Prescribe, educate and counsel regarding individualized specific dietary modifications aiming towards targeted core components such as weight, hypertension, lipid management, diabetes, heart  failure and other comorbidities.;Nutrition handout(s) given to patient.    Expected Outcomes  Short Term Goal: Understand basic principles of dietary content, such as calories, fat, sodium, cholesterol and nutrients.;Short Term Goal: A plan has been developed with personal nutrition goals set during dietitian appointment.;Long Term Goal: Adherence to prescribed nutrition plan.       Nutrition Discharge: Nutrition Assessments - 12/16/16 1430      MEDFICTS Scores   Pre Score  10       Education Questionnaire Score: Knowledge Questionnaire Score - 12/16/16 1417      Knowledge Questionnaire Score   Pre Score  23/28       Goals reviewed with patient; copy given to patient.

## 2017-01-26 ENCOUNTER — Ambulatory Visit: Payer: Self-pay

## 2017-01-27 NOTE — Telephone Encounter (Signed)
Salvatrice would like to discharge at this time

## 2017-01-30 ENCOUNTER — Ambulatory Visit: Payer: Self-pay

## 2017-02-01 ENCOUNTER — Ambulatory Visit: Payer: Self-pay

## 2017-02-01 ENCOUNTER — Encounter: Payer: Self-pay | Admitting: *Deleted

## 2017-02-01 DIAGNOSIS — Z955 Presence of coronary angioplasty implant and graft: Secondary | ICD-10-CM

## 2017-02-01 DIAGNOSIS — I214 Non-ST elevation (NSTEMI) myocardial infarction: Secondary | ICD-10-CM

## 2017-02-01 NOTE — Progress Notes (Signed)
Cardiac Individual Treatment Plan  Patient Details  Name: Lisa [name] MRN: [id number] Date of Birth: [birth year] Referring Provider:     Cardiac Rehab from 12/16/2016 in [facility] Cardiac and Pulmonary Rehab  Referring Provider  End      Initial Encounter Date:    Cardiac Rehab from 12/16/2016 in [facility] Cardiac and Pulmonary Rehab  Date  12/16/16  Referring Provider  End      Visit Diagnosis: NSTEMI (non-ST elevated myocardial infarction) [facility])  Status post coronary artery stent placement  Patient's Home Medications on Admission:  Current Outpatient Medications:  .  ALPRAZolam (XANAX) 0.25 MG tablet, Take 0.25 mg by mouth 2 (two) times daily as needed (for anxiety.). , Disp: , Rfl:  .  aspirin EC 81 MG tablet, Take 81 mg by mouth daily., Disp: , Rfl:  .  atorvastatin (LIPITOR) 40 MG tablet, Take 20 mg by mouth at bedtime., Disp: , Rfl:  .  clopidogrel (PLAVIX) 75 MG tablet, Take 1 tablet (75 mg total) by mouth daily., Disp: 90 tablet, Rfl: 3 .  diltiazem (CARDIZEM CD) 240 MG 24 hr capsule, Take 1 capsule (240 mg total) by mouth daily., Disp: 90 capsule, Rfl: 3 .  diltiazem (CARDIZEM) 30 MG tablet, One tablet every 6 hours as needed for heart rate >100 (Patient taking differently: Take 30 mg by mouth every 6 (six) hours as needed (for HR>100). One tablet every 6 hours as needed for heart rate >100), Disp: 30 tablet, Rfl: 5 .  ibuprofen (ADVIL,MOTRIN) 200 MG tablet, Take 400 mg by mouth daily., Disp: , Rfl:  .  Insulin Human (INSULIN PUMP) SOLN, Inject into the skin. HUMALOG 100 UNITS/ML WITH INSULIN PUMP, Disp: , Rfl:  .  insulin lispro (HUMALOG) 100 UNIT/ML cartridge, Inject into the skin 3 (three) times daily with meals. INSULIN PUMP, Disp: , Rfl:  .  nitroGLYCERIN (NITROSTAT) 0.4 MG SL tablet, Place 1 tablet (0.4 mg total) under the tongue every 5 (five) minutes x 3 doses as needed for chest pain., Disp: 25 tablet, Rfl: 12 .  Vitamin D, Ergocalciferol, (DRISDOL) 50000 units CAPS  capsule, Take 50,000 Units once a week by mouth., Disp: , Rfl:   Past Medical History: Past Medical History:  Diagnosis Date  . CAD (coronary artery disease)    a. 1999 PTCA @ [facility]; b. NSTEMI/Cath 10/11/2016: LM 20%, oLAD 40%, p-mLAD 40%, dLAD 40%, p-mLCx 95%, pRCA 30%, EF 55-65%; c. 10/2016 CT Surgery->rec PCI due to poor lung fxn. d. 12/02/16 DES to mid RCA.  . Groin hematoma    a. 10/2016 Spont groin hematoma w/ anemia req 1u prbcs.  . High cholesterol   . History of kidney stones   . Mucus plugging of bronchi    a. 10/2016 RLL Bronchus obstruction->bronchus vs mass;  b. 10/2016 CT Chest: resolution of RLL mucous plug.  . NSTEMI (non-ST elevated myocardial infarction) ([place]) 10/11/2016  . PAF (paroxysmal atrial fibrillation) ([place])    a. 12/2016 Noted during diag cath--CHA2DS2VASc = 3. No OAC in setting of need for DAPT and h/o spont Thigh Hematoma in 10/2016.  Marland Kitchen Pericardial effusion    a. 10/2016 Echo: EF 60-65%, no rwma, Gr2 DD, small to mod pericardial eff. No tamponade; b. 12/2016 Echo: EF 60-65%, no rwma, Gr1 DD, 1.2 to 1.4 cm mod to large circumferential pericardial effusion w/o tamponade.  . Pneumonia 02/2016   "couple times before 02/2016 too" (12/02/2016)  . PVD (peripheral vascular disease) ([place])    a. Abdominal aortogram 10/11/16: Infrarenal abdominal  aorta heavily calcified & ectatic with approximately 40-50% stenosis at the bifurcation. Mild diffuse disease w/ heavy calcification noted in the common & external iliac arteries bilaterally  . Type I diabetes mellitus ([place])    a. On insulin pump.(12/02/2016)  . Ventricular fibrillation ([place])    a. 10/11/2016: Felt to be secondary to demand ischemia in the setting of underlying CAD and anaphylactic reaction from IV Rocephin; b. 10/2016 Echo: EF 55-60%, ? mild inf HK; c. 10/2016 Seen by EP: No ICD indication.    Tobacco Use: Social History   Tobacco Use  Smoking Status Current Some Day Smoker  . Packs/day: 0.25  . Years: 47.00  . Pack  years: 11.75  . Types: Cigarettes  . Start date: 01/25/1969  . Last attempt to quit: 10/11/2016  . Years since quitting: 0.3  Smokeless Tobacco Never Used  Tobacco Comment   12/16/2016 Wants to quit, has not set quit date.  Has been informed about resources available for smoking cessation    Labs: Recent Review Flowsheet Data    Labs for ITP Cardiac and Pulmonary Rehab Latest Ref Rng & Units 10/11/2016 10/12/2016 10/13/2016 10/15/2016   Cholestrol 0 - 200 mg/dL - - 81 -   LDLCALC 0 - 99 mg/dL - - 36 -   HDL >40 mg/dL - - 34(L) -   Trlycerides <150 mg/dL 66 - 56 -   Hemoglobin A1c 4.8 - 5.6 % - 7.0(H) - 7.0(H)   PHART 7.350 - 7.450 7.23(L) - - 7.349(L)   PCO2ART 32.0 - 48.0 mmHg 36 - - 52.4(H)   HCO3 20.0 - 28.0 mmol/L 14.7(L) - - 28.1(H)   ACIDBASEDEF 0.0 - 2.0 mmol/L 12.1(H) - - -   O2SAT % 98.3 - - 98.1       Exercise Target Goals:    Exercise Program Goal: Individual exercise prescription set with THRR, safety & activity barriers. Participant demonstrates ability to understand and report RPE using BORG scale, to self-measure pulse accurately, and to acknowledge the importance of the exercise prescription.  Exercise Prescription Goal: Starting with aerobic activity 30 plus minutes a day, 3 days per week for initial exercise prescription. Provide home exercise prescription and guidelines that participant acknowledges understanding prior to discharge.  Activity Barriers & Risk Stratification: Activity Barriers & Cardiac Risk Stratification - 12/16/16 1356      Activity Barriers & Cardiac Risk Stratification   Activity Barriers  None    Cardiac Risk Stratification  High       6 Minute Walk: 6 Minute Walk    Row Name 12/16/16 1402         6 Minute Walk   Distance  990 feet     Walk Time  6 minutes     # of Rest Breaks  0     MPH  1.88     METS  3     VO2 Peak  10.48     Resting HR  97 bpm     Resting BP  114/62     Resting Oxygen Saturation   98 %     Max Ex. HR   135 bpm     Max Ex. BP  116/60     2 Minute Post BP  102/64        Oxygen Initial Assessment:   Oxygen Re-Evaluation:   Oxygen Discharge (Final Oxygen Re-Evaluation):   Initial Exercise Prescription: Initial Exercise Prescription - 12/16/16 1400      Date of Initial Exercise RX  and Referring Provider   Date  12/16/16    Referring Provider  End      Treadmill   MPH  1.8    Grade  2    Minutes  15    METs  2.87      NuStep   Level  2    SPM  80    Minutes  15    METs  2.8      REL-XR   Level  2    Speed  50    Minutes  15    METs  2.8      Prescription Details   Frequency (times per week)  3    Duration  Progress to 45 minutes of aerobic exercise without signs/symptoms of physical distress      Intensity   THRR 40-80% of Max Heartrate  120-144    Ratings of Perceived Exertion  11-13    Perceived Dyspnea  0-4      Resistance Training   Training Prescription  Yes    Weight  2 lb    Reps  10-15       Perform Capillary Blood Glucose checks as needed.  Exercise Prescription Changes: Exercise Prescription Changes    Row Name 12/16/16 1400             Response to Exercise   Blood Pressure (Admit)  114/62       Blood Pressure (Exercise)  116/60       Blood Pressure (Exit)  102/64       Heart Rate (Admit)  97 bpm       Heart Rate (Exercise)  134 bpm       Heart Rate (Exit)  118 bpm          Exercise Comments:   Exercise Goals and Review: Exercise Goals    Row Name 12/16/16 1401             Exercise Goals   Increase Physical Activity  Yes       Intervention  Provide advice, education, support and counseling about physical activity/exercise needs.;Develop an individualized exercise prescription for aerobic and resistive training based on initial evaluation findings, risk stratification, comorbidities and participant's personal goals.       Expected Outcomes  Achievement of increased cardiorespiratory fitness and enhanced flexibility,  muscular endurance and strength shown through measurements of functional capacity and personal statement of participant.       Increase Strength and Stamina  Yes       Intervention  Provide advice, education, support and counseling about physical activity/exercise needs.       Expected Outcomes  Achievement of increased cardiorespiratory fitness and enhanced flexibility, muscular endurance and strength shown through measurements of functional capacity and personal statement of participant.       Able to understand and use rate of perceived exertion (RPE) scale  Yes       Intervention  Provide education and explanation on how to use RPE scale       Expected Outcomes  Long Term:  Able to use RPE to guide intensity level when exercising independently       Able to understand and use Dyspnea scale  Yes       Intervention  Provide education and explanation on how to use Dyspnea scale       Expected Outcomes  Short Term: Able to use Dyspnea scale daily in rehab to express subjective sense of shortness of breath during  exertion;Long Term: Able to use Dyspnea scale to guide intensity level when exercising independently       Knowledge and understanding of Target Heart Rate Range (THRR)  Yes       Intervention  Provide education and explanation of THRR including how the numbers were predicted and where they are located for reference       Expected Outcomes  Short Term: Able to state/look up THRR;Long Term: Able to use THRR to govern intensity when exercising independently;Short Term: Able to use daily as guideline for intensity in rehab       Able to check pulse independently  Yes       Intervention  Provide education and demonstration on how to check pulse in carotid and radial arteries.;Review the importance of being able to check your own pulse for safety during independent exercise       Expected Outcomes  Long Term: Able to check pulse independently and accurately;Short Term: Able to explain why pulse  checking is important during independent exercise       Understanding of Exercise Prescription  Yes       Intervention  Provide education, explanation, and written materials on patient's individual exercise prescription       Expected Outcomes  Short Term: Able to explain program exercise prescription;Long Term: Able to explain home exercise prescription to exercise independently          Exercise Goals Re-Evaluation :   Discharge Exercise Prescription (Final Exercise Prescription Changes): Exercise Prescription Changes - 12/16/16 1400      Response to Exercise   Blood Pressure (Admit)  114/62    Blood Pressure (Exercise)  116/60    Blood Pressure (Exit)  102/64    Heart Rate (Admit)  97 bpm    Heart Rate (Exercise)  134 bpm    Heart Rate (Exit)  118 bpm       Nutrition:  Target Goals: Understanding of nutrition guidelines, daily intake of sodium 1500mg , cholesterol 200mg , calories 30% from fat and 7% or less from saturated fats, daily to have 5 or more servings of fruits and vegetables.  Biometrics: Pre Biometrics - 12/16/16 1401      Pre Biometrics   Height  5\' 2"  (1.575 m)    Weight  126 lb 4.8 oz (57.3 kg)    Waist Circumference  31 inches    Hip Circumference  37.25 inches    Waist to Hip Ratio  0.83 %    BMI (Calculated)  23.09    Single Leg Stand  30 seconds        Nutrition Therapy Plan and Nutrition Goals: Nutrition Therapy & Goals - 12/16/16 1356      Intervention Plan   Intervention  Prescribe, educate and counsel regarding individualized specific dietary modifications aiming towards targeted core components such as weight, hypertension, lipid management, diabetes, heart failure and other comorbidities.;Nutrition handout(s) given to patient.    Expected Outcomes  Short Term Goal: Understand basic principles of dietary content, such as calories, fat, sodium, cholesterol and nutrients.;Short Term Goal: A plan has been developed with personal nutrition goals  set during dietitian appointment.;Long Term Goal: Adherence to prescribed nutrition plan.       Nutrition Discharge: Rate Your Plate Scores: Nutrition Assessments - 12/16/16 1430      MEDFICTS Scores   Pre Score  10       Nutrition Goals Re-Evaluation:   Nutrition Goals Discharge (Final Nutrition Goals Re-Evaluation):   Psychosocial: Target Goals: Acknowledge  presence or absence of significant depression and/or stress, maximize coping skills, provide positive support system. Participant is able to verbalize types and ability to use techniques and skills needed for reducing stress and depression.   Initial Review & Psychosocial Screening: Initial Psych Review & Screening - 12/16/16 1410      Initial Review   Current issues with  Current Stress Concerns    Source of Stress Concerns  Unable to participate in former interests or hobbies    Comments  Still tired, low energy since discharge.  "They had to do 45 minutes of CPR on me"       [place]?  Yes spouse   daughter      Barriers   Psychosocial barriers to participate in program  There are no identifiable barriers or psychosocial needs.;The patient should benefit from training in stress management and relaxation.      Screening Interventions   Interventions  Yes;Encouraged to exercise;Program counselor consult;To provide support and resources with identified psychosocial needs;Provide feedback about the scores to participant    Expected Outcomes  Short Term goal: Utilizing psychosocial counselor, staff and physician to assist with identification of specific Stressors or current issues interfering with healing process. Setting desired goal for each stressor or current issue identified.;Long Term Goal: Stressors or current issues are controlled or eliminated.;Short Term goal: Identification and review with participant of any Quality of Life or Depression concerns found by scoring the questionnaire.;Long  Term goal: The participant improves quality of Life and PHQ9 Scores as seen by post scores and/or verbalization of changes       Quality of Life Scores:  Quality of Life - 12/16/16 1413      Quality of Life Scores   Health/Function Pre  20.43 %    Socioeconomic Pre  28.21 %    Psych/Spiritual Pre  26.57 %    Family Pre  30 %    GLOBAL Pre  24.71 %       PHQ-9: Recent Review Flowsheet Data    Depression screen [facility] 2/9 12/16/2016   Decreased Interest 1   Down, Depressed, Hopeless 0   PHQ - 2 Score 1   Altered sleeping 0   Tired, decreased energy 3   Change in appetite 2   Feeling bad or failure about yourself  0   Trouble concentrating 0   Moving slowly or fidgety/restless 0   Suicidal thoughts 0   PHQ-9 Score 6   Difficult doing work/chores Somewhat difficult     Interpretation of Total Score  Total Score Depression Severity:  1-4 = Minimal depression, 5-9 = Mild depression, 10-14 = Moderate depression, 15-19 = Moderately severe depression, 20-27 = Severe depression   Psychosocial Evaluation and Intervention:   Psychosocial Re-Evaluation:   Psychosocial Discharge (Final Psychosocial Re-Evaluation):   Vocational Rehabilitation: Provide vocational rehab assistance to qualifying candidates.   Vocational Rehab Evaluation & Intervention: Vocational Rehab - 12/16/16 1417      Initial Vocational Rehab Evaluation & Intervention   Assessment shows need for Vocational Rehabilitation  No       Education: Education Goals: Education classes will be provided on a variety of topics geared toward better understanding of heart health and risk factor modification. Participant will state understanding/return demonstration of topics presented as noted by education test scores.  Learning Barriers/Preferences: Learning Barriers/Preferences - 12/16/16 1416      Learning Barriers/Preferences   Learning Barriers  None    Learning Preferences  Verbal Instruction        Education Topics: General Nutrition Guidelines/Fats and Fiber: -Group instruction provided by verbal, written material, models and posters to present the general guidelines for heart healthy nutrition. Gives an explanation and review of dietary fats and fiber.   Controlling Sodium/Reading Food Labels: -Group verbal and written material supporting the discussion of sodium use in heart healthy nutrition. Review and explanation with models, verbal and written materials for utilization of the food label.   Exercise Physiology & Risk Factors: - Group verbal and written instruction with models to review the exercise physiology of the cardiovascular system and associated critical values. Details cardiovascular disease risk factors and the goals associated with each risk factor.   Aerobic Exercise & Resistance Training: - Gives group verbal and written discussion on the health impact of inactivity. On the components of aerobic and resistive training programs and the benefits of this training and how to safely progress through these programs.   Flexibility, Balance, General Exercise Guidelines: - Provides group verbal and written instruction on the benefits of flexibility and balance training programs. Provides general exercise guidelines with specific guidelines to those with heart or lung disease. Demonstration and skill practice provided.   Stress Management: - Provides group verbal and written instruction about the health risks of elevated stress, cause of high stress, and healthy ways to reduce stress.   Depression: - Provides group verbal and written instruction on the correlation between heart/lung disease and depressed mood, treatment options, and the stigmas associated with seeking treatment.   Anatomy & Physiology of the Heart: - Group verbal and written instruction and models provide basic cardiac anatomy and physiology, with the coronary electrical and arterial systems. Review  of: AMI, Angina, Valve disease, Heart Failure, Cardiac Arrhythmia, Pacemakers, and the ICD.   Cardiac Procedures: - Group verbal and written instruction to review commonly prescribed medications for heart disease. Reviews the medication, class of the drug, and side effects. Includes the steps to properly store meds and maintain the prescription regimen. (beta blockers and nitrates)   Cardiac Medications I: - Group verbal and written instruction to review commonly prescribed medications for heart disease. Reviews the medication, class of the drug, and side effects. Includes the steps to properly store meds and maintain the prescription regimen.   Cardiac Medications II: -Group verbal and written instruction to review commonly prescribed medications for heart disease. Reviews the medication, class of the drug, and side effects. (all other drug classes)    Go Sex-Intimacy & Heart Disease, Get SMART - Goal Setting: - Group verbal and written instruction through game format to discuss heart disease and the return to sexual intimacy. Provides group verbal and written material to discuss and apply goal setting through the application of the S.M.A.R.T. Method.   Other Matters of the Heart: - Provides group verbal, written materials and models to describe Heart Failure, Angina, Valve Disease, Peripheral Artery Disease, and Diabetes in the realm of heart disease. Includes description of the disease process and treatment options available to the cardiac patient.   Exercise & Equipment Safety: - Individual verbal instruction and demonstration of equipment use and safety with use of the equipment.   Cardiac Rehab from 12/16/2016 in [facility] Cardiac and Pulmonary Rehab  Date  12/16/16  Educator  SB  Instruction Review Code  1- Verbalizes Understanding      Infection Prevention: - Provides verbal and written material to individual with discussion of infection control including proper hand washing and  proper equipment  cleaning during exercise session.   Cardiac Rehab from 12/16/2016 in [facility] Cardiac and Pulmonary Rehab  Date  12/16/16  Educator  SB  Instruction Review Code  1- Verbalizes Understanding      Falls Prevention: - Provides verbal and written material to individual with discussion of falls prevention and safety.   Cardiac Rehab from 12/16/2016 in [facility] Cardiac and Pulmonary Rehab  Date  12/16/16  Educator  SB  Instruction Review Code  1- Verbalizes Understanding      Diabetes: - Individual verbal and written instruction to review signs/symptoms of diabetes, desired ranges of glucose level fasting, after meals and with exercise. Acknowledge that pre and post exercise glucose checks will be done for 3 sessions at entry of program.   Cardiac Rehab from 12/16/2016 in [facility] Cardiac and Pulmonary Rehab  Date  12/16/16  Educator  SB  Instruction Review Code  1- Verbalizes Understanding      Other: -Provides group and verbal instruction on various topics (see comments)    Knowledge Questionnaire Score: Knowledge Questionnaire Score - 12/16/16 1417      Knowledge Questionnaire Score   Pre Score  23/28       Core Components/Risk Factors/Patient Goals at Admission: Personal Goals and Risk Factors at Admission - 12/16/16 1408      Core Components/Risk Factors/Patient Goals on Admission    Weight Management  Yes;Weight Loss    Intervention  Weight Management: Develop a combined nutrition and exercise program designed to reach desired caloric intake, while maintaining appropriate intake of nutrient and fiber, sodium and fats, and appropriate energy expenditure required for the weight goal.;Weight Management: Provide education and appropriate resources to help participant work on and attain dietary goals.    Admit Weight  126 lb 4.8 oz (57.3 kg)    Goal Weight: Short Term  124 lb (56.2 kg)    Goal Weight: Long Term  120 lb (54.4 kg)    Expected Outcomes  Short Term:  Continue to assess and modify interventions until short term weight is achieved;Long Term: Adherence to nutrition and physical activity/exercise program aimed toward attainment of established weight goal;Weight Loss: Understanding of general recommendations for a balanced deficit meal plan, which promotes 1-2 lb weight loss per week and includes a negative energy balance of [phone] kcal/d    Tobacco Cessation  Yes    Number of packs per day  1-2 cigarettes per day Wants to quit. No quit date set yet    Intervention  Assist the participant in steps to quit. Provide individualized education and counseling about committing to Tobacco Cessation, relapse prevention, and pharmacological support that can be provided by physician.;Advice worker, assist with locating and accessing local/national Quit Smoking programs, and support quit date choice.    Expected Outcomes  Short Term: Will demonstrate readiness to quit, by selecting a quit date.;Long Term: Complete abstinence from all tobacco products for at least 12 months from quit date.;Short Term: Will quit all tobacco product use, adhering to prevention of relapse plan.    Diabetes  Yes    Intervention  Provide education about signs/symptoms and action to take for hypo/hyperglycemia.;Provide education about proper nutrition, including hydration, and aerobic/resistive exercise prescription along with prescribed medications to achieve blood glucose in normal ranges: Fasting glucose 65-99 mg/dL    Expected Outcomes  Short Term: Participant verbalizes understanding of the signs/symptoms and immediate care of hyper/hypoglycemia, proper foot care and importance of medication, aerobic/resistive exercise and nutrition plan for blood glucose control.;Long Term: Attainment  of HbA1C < 7%.    Lipids  Yes    Intervention  Provide education and support for participant on nutrition & aerobic/resistive exercise along with prescribed medications to achieve LDL  70mg , HDL >40mg .    Expected Outcomes  Short Term: Participant states understanding of desired cholesterol values and is compliant with medications prescribed. Participant is following exercise prescription and nutrition guidelines.;Long Term: Cholesterol controlled with medications as prescribed, with individualized exercise RX and with personalized nutrition plan. Value goals: LDL < 70mg , HDL > 40 mg.       Core Components/Risk Factors/Patient Goals Review:    Core Components/Risk Factors/Patient Goals at Discharge (Final Review):    ITP Comments: ITP Comments    Row Name 12/16/16 1350 01/04/17 0615 02/01/17 1045       ITP Comments  Medical review today. Initial ITP sent to Dr Loleta Chance for review, changes as needed and cosign.  Documentation of diagnosis can be found in [facility] 12/02/2016  30 day review. Continue with ITP unless directed changes per Medical Director review.   30 day review. Continue with ITP unless directed changes per Medical Director review.  No Dec visits        Comments:

## 2017-02-02 ENCOUNTER — Ambulatory Visit: Payer: Self-pay

## 2017-02-06 ENCOUNTER — Ambulatory Visit: Payer: Self-pay

## 2017-02-08 ENCOUNTER — Ambulatory Visit: Payer: Self-pay

## 2017-02-09 ENCOUNTER — Ambulatory Visit: Payer: Self-pay

## 2017-02-09 DIAGNOSIS — R69 Illness, unspecified: Secondary | ICD-10-CM | POA: Diagnosis not present

## 2017-02-13 ENCOUNTER — Ambulatory Visit: Payer: Self-pay

## 2017-02-14 DIAGNOSIS — I2510 Atherosclerotic heart disease of native coronary artery without angina pectoris: Secondary | ICD-10-CM | POA: Diagnosis not present

## 2017-02-14 DIAGNOSIS — E1165 Type 2 diabetes mellitus with hyperglycemia: Secondary | ICD-10-CM | POA: Diagnosis not present

## 2017-02-14 DIAGNOSIS — I10 Essential (primary) hypertension: Secondary | ICD-10-CM | POA: Diagnosis not present

## 2017-02-14 DIAGNOSIS — M810 Age-related osteoporosis without current pathological fracture: Secondary | ICD-10-CM | POA: Diagnosis not present

## 2017-02-15 ENCOUNTER — Ambulatory Visit: Payer: Self-pay

## 2017-02-16 ENCOUNTER — Ambulatory Visit: Payer: Self-pay

## 2017-02-20 ENCOUNTER — Ambulatory Visit: Payer: Self-pay

## 2017-02-21 DIAGNOSIS — K229 Disease of esophagus, unspecified: Secondary | ICD-10-CM | POA: Diagnosis not present

## 2017-02-21 DIAGNOSIS — I10 Essential (primary) hypertension: Secondary | ICD-10-CM | POA: Diagnosis not present

## 2017-02-21 DIAGNOSIS — L259 Unspecified contact dermatitis, unspecified cause: Secondary | ICD-10-CM | POA: Diagnosis not present

## 2017-02-21 DIAGNOSIS — R69 Illness, unspecified: Secondary | ICD-10-CM | POA: Diagnosis not present

## 2017-02-21 DIAGNOSIS — I2510 Atherosclerotic heart disease of native coronary artery without angina pectoris: Secondary | ICD-10-CM | POA: Diagnosis not present

## 2017-02-21 DIAGNOSIS — I340 Nonrheumatic mitral (valve) insufficiency: Secondary | ICD-10-CM | POA: Diagnosis not present

## 2017-02-21 DIAGNOSIS — E118 Type 2 diabetes mellitus with unspecified complications: Secondary | ICD-10-CM | POA: Diagnosis not present

## 2017-02-21 DIAGNOSIS — E1165 Type 2 diabetes mellitus with hyperglycemia: Secondary | ICD-10-CM | POA: Diagnosis not present

## 2017-02-21 DIAGNOSIS — E7800 Pure hypercholesterolemia, unspecified: Secondary | ICD-10-CM | POA: Diagnosis not present

## 2017-02-21 DIAGNOSIS — Z9641 Presence of insulin pump (external) (internal): Secondary | ICD-10-CM | POA: Diagnosis not present

## 2017-02-21 DIAGNOSIS — I469 Cardiac arrest, cause unspecified: Secondary | ICD-10-CM | POA: Diagnosis not present

## 2017-02-22 ENCOUNTER — Ambulatory Visit: Payer: Self-pay

## 2017-02-23 ENCOUNTER — Ambulatory Visit: Payer: Self-pay

## 2017-02-27 ENCOUNTER — Ambulatory Visit: Payer: Self-pay

## 2017-03-01 ENCOUNTER — Ambulatory Visit: Payer: Self-pay

## 2017-03-02 ENCOUNTER — Ambulatory Visit: Payer: Self-pay

## 2017-03-06 ENCOUNTER — Ambulatory Visit: Payer: Self-pay

## 2017-03-06 NOTE — Progress Notes (Signed)
Follow-up Outpatient Visit Date: 03/08/2017  Primary Care Provider: Lenard Simmer, MD [place] [address]  Chief Complaint: Follow-up coronary artery disease  HPI:  Lisa [name] is a 67 y.o. year-old female with history of coronary artery disease status post remote coronary intervention at [place] (the late 90s) and cardiac arrest in the setting of anaphylaxis with catheterization demonstrating severe 2 vessel CAD (subsequent atherectomy and PCI to RCA),recurrent pericardial effusion, type1 diabetes mellitus, peripheral vascular disease with AAA, right lower lobe pneumonia with mucous plugging, and spontaneous right thigh hematoma in the setting of anticoagulation, who presents for follow-up of coronary artery disease. I last saw Ms. Bernards in 12/2016, at which time she had been feeling well except for one day when she noted her heart rate to be elevated. This resolved after a single dose of diltiazem.  Today, Lisa [name] reports that she has been feeling very well. She denies chest pain, shortness of breath, palpitations, lightheadedness, and edema. She attended cardiac rehab orientation but decided not to proceed with the program due to scheduling. She would like to begin exercising on her own through the [employer] program. She has not needed to use and prn diltiazem since our last visit.  --------------------------------------------------------------------------------------------------  Cardiovascular History & Procedures: Cardiovascular Problems:  Coronary artery disease with VF arrest and transient complete heart block in the setting of anaphylactic shock  Recurrent pericardial effusion  Paroxysmal atrial fibrillation  Risk Factors:  Known coronary artery disease, peripheral vascular disease, and type 1 diabetes mellitus  Cath/PCI:  PCI (12/02/16): Successful orbital atherectomy and PCI to heavily calcified mid RCA disease with placement of a Xience Alpine  3.0 x 33 mm drug-eluting stent.  LHC (10/11/16): Severe two-vessel coronary artery disease including heavily calcified 95% proximal/mid LCx and sequential 90-99% mid RCA stenoses. Mild to moderate, nonobstructive disease involving the LMCA and LAD. Basal inferior hypokinesis with overall preserved LV contraction. Mildly elevated LVEDP. Peripheral vascular disease with ectasia and calcification of the infrarenal abdominal aorta and iliac arteries.  CV Surgery:  None  EP Procedures and Devices:  None  Non-Invasive Evaluation(s):  Limited TTE (01/19/17): Trivial pericardial effusion, significantly decreased in size from prior study. LVEF 55-60%.  Limited TTE (12/13/16): Normal LV size with LVEF of 65-70%. Normal RV size and function. Moderate to large circumferential pericardial effusion, stable to slightly decreased in size compared with 12/09/16. No evidence of tamponadephysiology.  TTE (10/25/16): Normal LV size with mild to moderate LVH. LVEF 60-65% with grade 2 diastolic dysfunction. RV size and function. Normal PA pressure. Small-to-moderate pericardial effusion.  TTE (10/12/16): Normal LV size. LVEF 55-60%. Normal diastolic function. Normal RV size and function. Normal PA pressure.  Recent CV Pertinent Labs: Lab Results  Component Value Date   CHOL 81 10/13/2016   HDL 34 (L) 10/13/2016   LDLCALC 36 10/13/2016   TRIG 56 10/13/2016   CHOLHDL 2.4 10/13/2016   INR 0.94 11/23/2016   BNP 69.0 10/25/2016   K 4.5 12/08/2016   MG 1.6 (L) 12/08/2016   BUN 15 12/08/2016   CREATININE 0.65 12/08/2016    Past medical and surgical history were reviewed and updated in EPIC.  Current Meds  Medication Sig  . ALPRAZolam (XANAX) 0.25 MG tablet Take 0.25 mg by mouth 2 (two) times daily as needed (for anxiety.).   Marland Kitchen aspirin EC 81 MG tablet Take 81 mg by mouth daily.  Marland Kitchen atorvastatin (LIPITOR) 40 MG tablet Take 20 mg by mouth at bedtime.  . clopidogrel (  PLAVIX) 75 MG tablet Take 1 tablet  (75 mg total) by mouth daily.  Marland Kitchen diltiazem (CARDIZEM CD) 240 MG 24 hr capsule Take 1 capsule (240 mg total) by mouth daily.  Marland Kitchen diltiazem (CARDIZEM) 30 MG tablet One tablet every 6 hours as needed for heart rate >100 (Patient taking differently: Take 30 mg by mouth every 6 (six) hours as needed (for HR>100). One tablet every 6 hours as needed for heart rate >100)  . ibuprofen (ADVIL,MOTRIN) 200 MG tablet Take 400 mg by mouth daily.  . Insulin Human (INSULIN PUMP) SOLN Inject into the skin. HUMALOG 100 UNITS/ML WITH INSULIN PUMP  . insulin lispro (HUMALOG) 100 UNIT/ML cartridge Inject into the skin 3 (three) times daily with meals. INSULIN PUMP  . nitroGLYCERIN (NITROSTAT) 0.4 MG SL tablet Place 1 tablet (0.4 mg total) under the tongue every 5 (five) minutes x 3 doses as needed for chest pain.  . Vitamin D, Ergocalciferol, (DRISDOL) 50000 units CAPS capsule Take 50,000 Units once a week by mouth.    Allergies: Ceftriaxone; Rocephin [ceftriaxone sodium in dextrose]; and Albuterol  Social History   Socioeconomic History  . Marital status: Married    Spouse name: Not on file  . Number of children: Not on file  . Years of education: Not on file  . Highest education level: Not on file  Social Needs  . Financial resource strain: Not on file  . Food insecurity - worry: Not on file  . Food insecurity - inability: Not on file  . Transportation needs - medical: Not on file  . Transportation needs - non-medical: Not on file  Occupational History  . Not on file  Tobacco Use  . Smoking status: Current Some Day Smoker    Packs/day: 0.25    Years: 47.00    Pack years: 11.75    Types: Cigarettes    Start date: 01/25/1969    Last attempt to quit: 10/11/2016    Years since quitting: 0.4  . Smokeless tobacco: Never Used  . Tobacco comment: 12/16/2016 Wants to quit, has not set quit date.  Has been informed about resources available for smoking cessation  Substance and Sexual Activity  . Alcohol  use: Yes    Alcohol/week: 6.0 oz    Types: 10 Glasses of wine per week  . Drug use: No  . Sexual activity: Not Currently  Other Topics Concern  . Not on file  Social History Narrative   [facility] (10/14/16):   Patient has primarily done office work. Married for approximately 1 year. Has a dog at home but no bird exposure. No mold exposure.    Family History  Problem Relation Age of Onset  . Hypertension Mother   . Aortic aneurysm Father   . Hypertension Brother   . Lung disease Neg Hx   . Rheumatologic disease Neg Hx     Review of Systems: A 12-system review of systems was performed and was negative except as noted in the HPI.  --------------------------------------------------------------------------------------------------  Physical Exam: BP 118/60 (BP Location: Left Arm, Patient Position: Sitting, Cuff Size: Normal)   Pulse 84   Ht 5\' 3"  (1.6 m)   Wt 127 lb 8 oz (57.8 kg)   BMI 22.59 kg/m   General:  Thin woman, seated comfortably in the exam room. She is accompanied by her husband and daughter. HEENT: No conjunctival pallor or scleral icterus. Moist mucous membranes.  OP clear. Neck: Supple without lymphadenopathy, thyromegaly, JVD, or HJR. No carotid bruit. Lungs: Normal  work of breathing. Clear to auscultation bilaterally without wheezes or crackles. Heart: Regular rate and rhythm without murmurs, rubs, or gallops. Non-displaced PMI. Abd: Bowel sounds present. Soft, NT/ND without hepatosplenomegaly Ext: No lower extremity edema. Skin: Warm and dry without rash.  EKG:  NSR with non-specific T-wave abnormality.  Lab Results  Component Value Date   WBC 12.8 (H) 12/08/2016   HGB 11.9 (L) 12/08/2016   HCT 35.9 12/08/2016   MCV 82.8 12/08/2016   PLT 473 (H) 12/08/2016    Lab Results  Component Value Date   NA 133 (L) 12/08/2016   K 4.5 12/08/2016   CL 99 (L) 12/08/2016   CO2 24 12/08/2016   BUN 15 12/08/2016   CREATININE 0.65 12/08/2016   GLUCOSE  191 (H) 12/08/2016   ALT 41 10/26/2016    Lab Results  Component Value Date   CHOL 81 10/13/2016   HDL 34 (L) 10/13/2016   LDLCALC 36 10/13/2016   TRIG 56 10/13/2016   CHOLHDL 2.4 10/13/2016    --------------------------------------------------------------------------------------------------  ASSESSMENT AND PLAN: Coronary artery disease without angina No chest pain or shortness of breath noted. We discussed risks and benefits of PCI to LCx lesion. However, given lack of symptoms and high risk nature of the intervention given heavy calcification, we have agreed to defer intervention and continue with medical therapy. We will continue DAPT with ASA and clopidogrel, as well as atorvastatin and diltiazem. We discussed adding and/or switching to a beta blocker but have agreed to defer this given borderline low blood pressure and lack of symptoms. Of noted LVEF is preserved. I have encouraged her to begin exercising slowly and to let us know if she develops any chest pain or shortness of breath out of proportion to her activity.  Paroxysmal atrial fibrillation Occurred during hospitalization this fall and during most recent PCI. Otherwise, no evidence of recurrence. EKG is normal today. Given history of spontaneous thigh bleeding, we will not add anticoagulation today, though CHADSVASC is > 2.  Hyperlipidemia LDL excellent in 10/2016. Continue atorvastatin.  Pericardial effusion No signs or symptoms to suggest recurrence. Effusion was much smaller on f/u echo in 12/2016. Continue clinical follow-up.  Follow-up: Return to clinic in 3 months.  Nelva Bush, MD 03/08/2017 2:00 PM

## 2017-03-08 ENCOUNTER — Ambulatory Visit: Payer: Self-pay

## 2017-03-08 ENCOUNTER — Ambulatory Visit: Payer: Medicare HMO | Admitting: Internal Medicine

## 2017-03-08 ENCOUNTER — Encounter: Payer: Self-pay | Admitting: Internal Medicine

## 2017-03-08 VITALS — BP 118/60 | HR 84 | Ht 63.0 in | Wt 127.5 lb

## 2017-03-08 DIAGNOSIS — E785 Hyperlipidemia, unspecified: Secondary | ICD-10-CM | POA: Diagnosis not present

## 2017-03-08 DIAGNOSIS — I3139 Other pericardial effusion (noninflammatory): Secondary | ICD-10-CM

## 2017-03-08 DIAGNOSIS — I313 Pericardial effusion (noninflammatory): Secondary | ICD-10-CM | POA: Diagnosis not present

## 2017-03-08 DIAGNOSIS — I480 Paroxysmal atrial fibrillation: Secondary | ICD-10-CM | POA: Diagnosis not present

## 2017-03-08 DIAGNOSIS — I2510 Atherosclerotic heart disease of native coronary artery without angina pectoris: Secondary | ICD-10-CM

## 2017-03-08 NOTE — Patient Instructions (Signed)
Medication Instructions:  Your physician recommends that you continue on your current medications as directed. Please refer to the Current Medication list given to you today.   Labwork: none  Testing/Procedures: none  Follow-Up: Your physician recommends that you schedule a follow-up appointment in: 3 MONTHS WITH DR END.   If you need a refill on your cardiac medications before your next appointment, please call your pharmacy.   

## 2017-03-09 ENCOUNTER — Ambulatory Visit: Payer: Self-pay

## 2017-03-12 DIAGNOSIS — R69 Illness, unspecified: Secondary | ICD-10-CM | POA: Diagnosis not present

## 2017-03-13 ENCOUNTER — Ambulatory Visit: Payer: Self-pay

## 2017-03-15 ENCOUNTER — Ambulatory Visit: Payer: Self-pay

## 2017-03-16 ENCOUNTER — Ambulatory Visit: Payer: Self-pay

## 2017-03-20 ENCOUNTER — Ambulatory Visit: Payer: Self-pay

## 2017-03-27 DIAGNOSIS — E109 Type 1 diabetes mellitus without complications: Secondary | ICD-10-CM | POA: Diagnosis not present

## 2017-03-30 ENCOUNTER — Ambulatory Visit (INDEPENDENT_AMBULATORY_CARE_PROVIDER_SITE_OTHER): Payer: Medicare HMO | Admitting: Obstetrics and Gynecology

## 2017-03-30 ENCOUNTER — Encounter: Payer: Self-pay | Admitting: Obstetrics and Gynecology

## 2017-03-30 VITALS — BP 140/68 | HR 76 | Ht 63.0 in | Wt 130.0 lb

## 2017-03-30 DIAGNOSIS — Z124 Encounter for screening for malignant neoplasm of cervix: Secondary | ICD-10-CM

## 2017-03-30 DIAGNOSIS — Z1239 Encounter for other screening for malignant neoplasm of breast: Secondary | ICD-10-CM

## 2017-03-30 DIAGNOSIS — Z01419 Encounter for gynecological examination (general) (routine) without abnormal findings: Secondary | ICD-10-CM | POA: Diagnosis not present

## 2017-03-30 DIAGNOSIS — Z1231 Encounter for screening mammogram for malignant neoplasm of breast: Secondary | ICD-10-CM

## 2017-03-30 DIAGNOSIS — B373 Candidiasis of vulva and vagina: Secondary | ICD-10-CM

## 2017-03-30 DIAGNOSIS — B3731 Acute candidiasis of vulva and vagina: Secondary | ICD-10-CM

## 2017-03-30 MED ORDER — TERCONAZOLE 0.8 % VA CREA: 1.0000 | TOPICAL_CREAM | Freq: Every day | VAGINAL | 1 refills | Status: DC

## 2017-03-30 NOTE — Patient Instructions (Signed)
Recurrent yeast infections: RepHresh Pro-B  Preventive Care 40-64 Years, Female Preventive care refers to lifestyle choices and visits with your health care provider that can promote health and wellness. What does preventive care include?  A yearly physical exam. This is also called an annual well check.  Dental exams once or twice a year.  Routine eye exams. Ask your health care provider how often you should have your eyes checked.  Personal lifestyle choices, including: ? Daily care of your teeth and gums. ? Regular physical activity. ? Eating a healthy diet. ? Avoiding tobacco and drug use. ? Limiting alcohol use. ? Practicing safe sex. ? Taking low-dose aspirin daily starting at age 14. ? Taking vitamin and mineral supplements as recommended by your health care provider. What happens during an annual well check? The services and screenings done by your health care provider during your annual well check will depend on your age, overall health, lifestyle risk factors, and family history of disease. Counseling Your health care provider may ask you questions about your:  Alcohol use.  Tobacco use.  Drug use.  Emotional well-being.  Home and relationship well-being.  Sexual activity.  Eating habits.  Work and work Statistician.  Method of birth control.  Menstrual cycle.  Pregnancy history.  Screening You may have the following tests or measurements:  Height, weight, and BMI.  Blood pressure.  Lipid and cholesterol levels. These may be checked every 5 years, or more frequently if you are over 69 years old.  Skin check.  Lung cancer screening. You may have this screening every year starting at age 2 if you have a 30-pack-year history of smoking and currently smoke or have quit within the past 15 years.  Fecal occult blood test (FOBT) of the stool. You may have this test every year starting at age 41.  Flexible sigmoidoscopy or colonoscopy. You may have a  sigmoidoscopy every 5 years or a colonoscopy every 10 years starting at age 66.  Hepatitis C blood test.  Hepatitis B blood test.  Sexually transmitted disease (STD) testing.  Diabetes screening. This is done by checking your blood sugar (glucose) after you have not eaten for a while (fasting). You may have this done every 1-3 years.  Mammogram. This may be done every 1-2 years. Talk to your health care provider about when you should start having regular mammograms. This may depend on whether you have a family history of breast cancer.  BRCA-related cancer screening. This may be done if you have a family history of breast, ovarian, tubal, or peritoneal cancers.  Pelvic exam and Pap test. This may be done every 3 years starting at age 65. Starting at age 14, this may be done every 5 years if you have a Pap test in combination with an HPV test.  Bone density scan. This is done to screen for osteoporosis. You may have this scan if you are at high risk for osteoporosis.  Discuss your test results, treatment options, and if necessary, the need for more tests with your health care provider. Vaccines Your health care provider may recommend certain vaccines, such as:  Influenza vaccine. This is recommended every year.  Tetanus, diphtheria, and acellular pertussis (Tdap, Td) vaccine. You may need a Td booster every 10 years.  Varicella vaccine. You may need this if you have not been vaccinated.  Zoster vaccine. You may need this after age 44.  Measles, mumps, and rubella (MMR) vaccine. You may need at least one dose of  MMR if you were born in 1957 or later. You may also need a second dose.  Pneumococcal 13-valent conjugate (PCV13) vaccine. You may need this if you have certain conditions and were not previously vaccinated.  Pneumococcal polysaccharide (PPSV23) vaccine. You may need one or two doses if you smoke cigarettes or if you have certain conditions.  Meningococcal vaccine. You may  need this if you have certain conditions.  Hepatitis A vaccine. You may need this if you have certain conditions or if you travel or work in places where you may be exposed to hepatitis A.  Hepatitis B vaccine. You may need this if you have certain conditions or if you travel or work in places where you may be exposed to hepatitis B.  Haemophilus influenzae type b (Hib) vaccine. You may need this if you have certain conditions.  Talk to your health care provider about which screenings and vaccines you need and how often you need them. This information is not intended to replace advice given to you by your health care provider. Make sure you discuss any questions you have with your health care provider. Document Released: 02/13/2015 Document Revised: 10/07/2015 Document Reviewed: 11/18/2014 Elsevier Interactive Patient Education  [employer].

## 2017-03-30 NOTE — Progress Notes (Signed)
Patient ID: Lisa [name], female   DOB: [birth year], 67 y.o.   MRN: [id number]      Gynecology Annual Exam  PCP: Lenard Simmer, MD  Chief Complaint:  Chief Complaint  Patient presents with  . Gynecologic Exam    possibly yeast infection-itching and burning    History of Present Illness:Patient is a 67 y.o. G1P1001 presents for annual exam. The patient has no complaints today. She did have what sound like a cardiac arrest November 2018 with cardiac stent placement at that time.  She has been doing well since that time.    LMP: No LMP recorded. Patient is postmenopausal.  The patient does perform self breast exams.  There is no notable family history of breast or ovarian cancer in her family.  The patient wears seatbelts: yes.   The patient has regular exercise: not asked.    The patient denies current symptoms of depression.     Review of Systems: Review of Systems  Constitutional: Negative for chills and fever.  HENT: Negative for congestion.   Respiratory: Negative for cough and shortness of breath.   Cardiovascular: Positive for orthopnea. Negative for chest pain, palpitations and leg swelling.  Gastrointestinal: Negative for abdominal pain, constipation, diarrhea, heartburn, nausea and vomiting.  Genitourinary: Negative for dysuria, frequency and urgency.  Skin: Negative for itching and rash.  Neurological: Negative for dizziness and headaches.  Endo/Heme/Allergies: Negative for polydipsia.  Psychiatric/Behavioral: Negative for depression.    Past Medical History:  Past Medical History:  Diagnosis Date  . CAD (coronary artery disease)    a. 1999 PTCA @ [facility]; b. NSTEMI/Cath 10/11/2016: LM 20%, oLAD 40%, p-mLAD 40%, dLAD 40%, p-mLCx 95%, pRCA 30%, EF 55-65%; c. 10/2016 CT Surgery->rec PCI due to poor lung fxn. d. 12/02/16 DES to mid RCA.  . Groin hematoma    a. 10/2016 Spont groin hematoma w/ anemia req 1u prbcs.  . High cholesterol   . History of kidney stones   .  Mucus plugging of bronchi    a. 10/2016 RLL Bronchus obstruction->bronchus vs mass;  b. 10/2016 CT Chest: resolution of RLL mucous plug.  . NSTEMI (non-ST elevated myocardial infarction) ([place]) 10/11/2016  . PAF (paroxysmal atrial fibrillation) ([place])    a. 12/2016 Noted during diag cath--CHA2DS2VASc = 3. No OAC in setting of need for DAPT and h/o spont Thigh Hematoma in 10/2016.  Marland Kitchen Pericardial effusion    a. 10/2016 Echo: EF 60-65%, no rwma, Gr2 DD, small to mod pericardial eff. No tamponade; b. 12/2016 Echo: EF 60-65%, no rwma, Gr1 DD, 1.2 to 1.4 cm mod to large circumferential pericardial effusion w/o tamponade.  . Pneumonia 02/2016   "couple times before 02/2016 too" (12/02/2016)  . PVD (peripheral vascular disease) ([place])    a. Abdominal aortogram 10/11/16: Infrarenal abdominal aorta heavily calcified & ectatic with approximately 40-50% stenosis at the bifurcation. Mild diffuse disease w/ heavy calcification noted in the common & external iliac arteries bilaterally  . Type I diabetes mellitus ([place])    a. On insulin pump.(12/02/2016)  . Ventricular fibrillation ([place])    a. 10/11/2016: Felt to be secondary to demand ischemia in the setting of underlying CAD and anaphylactic reaction from IV Rocephin; b. 10/2016 Echo: EF 55-60%, ? mild inf HK; c. 10/2016 Seen by EP: No ICD indication.    Past Surgical History:  Past Surgical History:  Procedure Laterality Date  . ABDOMINAL AORTOGRAM N/A 10/11/2016   Procedure: ABDOMINAL AORTOGRAM;  Surgeon: Nelva Bush, MD;  Location: [facility];  Service: Cardiovascular;  Laterality: N/A;  . CARDIAC CATHETERIZATION  10/2016  . CERVICAL BIOPSY  W/ LOOP ELECTRODE EXCISION  03/05/2012  . COLONOSCOPY    . COLPOSCOPY  01/17/2011  . CORONARY ANGIOPLASTY WITH STENT PLACEMENT  1999; 12/02/2016   "1 stent  + 1 stent"  . CORONARY ATHERECTOMY N/A 12/02/2016   Procedure: CORONARY ATHERECTOMY - CSI;  Surgeon: Nelva Bush, MD;  Location: [facility];   Service: Cardiovascular;  Laterality: N/A;  . CORONARY STENT INTERVENTION N/A 12/02/2016   Procedure: CORONARY STENT INTERVENTION;  Surgeon: Nelva Bush, MD;  Location: [place];  Service: Cardiovascular;  Laterality: N/A;  . LEFT HEART CATH AND CORONARY ANGIOGRAPHY N/A 10/11/2016   Procedure: LEFT HEART CATH AND CORONARY ANGIOGRAPHY;  Surgeon: Nelva Bush, MD;  Location: [facility];  Service: Cardiovascular;  Laterality: N/A;  . NASAL SINUS SURGERY  ~ 2009  . TEMPORARY PACEMAKER N/A 12/02/2016   Procedure: TEMPORARY PACEMAKER;  Surgeon: Nelva Bush, MD;  Location: [facility];  Service: Cardiovascular;  Laterality: N/A;  . TONSILLECTOMY      Gynecologic History:  No LMP recorded. Patient is postmenopausal. Last Pap: Results were: 02/04/16 NIL and HR HPV negative  Last mammogram: 02/04/15 Results were: BI-RAD I  Obstetric History: G1P1001  Family History:  Family History  Problem Relation Age of Onset  . Hypertension Mother   . Aortic aneurysm Father   . Hypertension Brother   . Lung disease Neg Hx   . Rheumatologic disease Neg Hx     Social History:  Social History   Socioeconomic History  . Marital status: Married    Spouse name: Not on file  . Number of children: Not on file  . Years of education: Not on file  . Highest education level: Not on file  Social Needs  . Financial resource strain: Not on file  . Food insecurity - worry: Not on file  . Food insecurity - inability: Not on file  . Transportation needs - medical: Not on file  . Transportation needs - non-medical: Not on file  Occupational History  . Not on file  Tobacco Use  . Smoking status: Former Smoker    Packs/day: 0.25    Years: 47.00    Pack years: 11.75    Types: Cigarettes    Start date: 01/25/1969    Last attempt to quit: 10/11/2016    Years since quitting: 0.4  . Smokeless tobacco: Never Used  . Tobacco comment: 12/16/2016 Wants to quit, has not set quit  date.  Has been informed about resources available for smoking cessation  Substance and Sexual Activity  . Alcohol use: No    Alcohol/week: 0.0 oz    Frequency: Never  . Drug use: No  . Sexual activity: Yes    Birth control/protection: None  Other Topics Concern  . Not on file  Social History Narrative   [facility] (10/14/16):   Patient has primarily done office work. Married for approximately 1 year. Has a dog at home but no bird exposure. No mold exposure.    Allergies:  Allergies  Allergen Reactions  . Ceftriaxone     Anaphylaxis, cardiac arrest  . Rocephin [Ceftriaxone Sodium In Dextrose] Anaphylaxis  . Albuterol Other (See Comments)    Tachycardia-->Afib. Tolerates Xopenex    Medications: Prior to Admission medications   Medication Sig Start Date End Date Taking? Authorizing Provider  ALPRAZolam Duanne Moron) 0.25 MG tablet Take 0.25 mg by  mouth 2 (two) times daily as needed (for anxiety.).    Yes [provider]  aspirin EC 81 MG tablet Take 81 mg by mouth daily.   Yes [provider]  atorvastatin (LIPITOR) 40 MG tablet Take 20 mg by mouth at bedtime.   Yes [provider]  clopidogrel (PLAVIX) 75 MG tablet Take 1 tablet (75 mg total) by mouth daily. 11/23/16  Yes End, Harrell Gave, MD  diltiazem (CARDIZEM) 30 MG tablet One tablet every 6 hours as needed for heart rate >100 Patient taking differently: Take 30 mg by mouth every 6 (six) hours as needed (for HR>100). One tablet every 6 hours as needed for heart rate >100 11/02/16  Yes Theora Gianotti, NP  ibuprofen (ADVIL,MOTRIN) 200 MG tablet Take 400 mg by mouth daily.   Yes [provider]  Insulin Human (INSULIN PUMP) SOLN Inject into the skin. HUMALOG 100 UNITS/ML WITH INSULIN PUMP   Yes [provider]  insulin lispro (HUMALOG) 100 UNIT/ML cartridge Inject into the skin 3 (three) times daily with meals. INSULIN PUMP   Yes [provider]  nitroGLYCERIN  (NITROSTAT) 0.4 MG SL tablet Place 1 tablet (0.4 mg total) under the tongue every 5 (five) minutes x 3 doses as needed for chest pain. 10/21/16  Yes Bhagat, Bhavinkumar, PA  Vitamin D, Ergocalciferol, (DRISDOL) 50000 units CAPS capsule Take 50,000 Units once a week by mouth.   Yes [provider]  diltiazem (CARDIZEM CD) 240 MG 24 hr capsule Take 1 capsule (240 mg total) by mouth daily. 12/27/16 03/27/17  End, Harrell Gave, MD    Physical Exam Vitals: Blood pressure 140/68, pulse 76, height 5\' 3"  (1.6 m), weight 130 lb (59 kg).  General: NAD HEENT: normocephalic, anicteric Thyroid: no enlargement, no palpable nodules Pulmonary: No increased work of breathing, CTAB Cardiovascular: RRR, distal pulses 2+ Breast: Breast symmetrical, no tenderness, no palpable nodules or masses, no skin or nipple retraction present, no nipple discharge.  No axillary or supraclavicular lymphadenopathy. Abdomen: NABS, soft, non-tender, non-distended.  Umbilicus without lesions.  No hepatomegaly, splenomegaly or masses palpable. No evidence of hernia  Genitourinary:  External: Normal external female genitalia.  Normal urethral meatus, normal  Bartholin's and Skene's glands.    Vagina: Normal vaginal mucosa, no evidence of prolapse.    Cervix: Grossly normal in appearance, no bleeding  Uterus: Non-enlarged, mobile, normal contour.  No CMT  Adnexa: ovaries non-enlarged, no adnexal masses  Rectal: deferred  Lymphatic: no evidence of inguinal lymphadenopathy Extremities: no edema, erythema, or tenderness Neurologic: Grossly intact Psychiatric: mood appropriate, affect full  Female chaperone present for pelvic and breast  portions of the physical exam     Assessment: 67 y.o. G1P1001 routine annual exam  Plan: Problem List Items Addressed This Visit    None    Visit Diagnoses    Vaginal candida    -  Primary   Screening for malignant neoplasm of cervix       Relevant Orders   PapIG, HPV, rfx 16/18    Breast screening       Relevant Orders   MM DIGITAL SCREENING BILATERAL   Encounter for gynecological examination without abnormal finding       Relevant Orders   PapIG, HPV, rfx 16/18      1) Mammogram - recommend yearly screening mammogram.  Mammogram Was ordered today  2) STI screening  was notoffered and therefore not obtained  3) ASCCP guidelines and rational discussed.  Patient opts for yearly screening interval  4) Osteoporosis  - DEXA being obtained at PCP office - currently on bisphosphonate therapy  5) Routine healthcare maintenance including cholesterol, diabetes screening discussed managed by PCP  6) Colonoscopy - is due this year but given cardiac event may not be a good candidate for colonoscopy. Discussed Cologuard and given handouts  7) Recurrent candida - rx terazol, discussed vaginal probiotics  8) Return in 1 year (on 03/30/2018) for Annual.    Malachy Mood, MD [place] Pigeon, [facility] 03/30/2017, 11:30 AM

## 2017-04-03 DIAGNOSIS — I2510 Atherosclerotic heart disease of native coronary artery without angina pectoris: Secondary | ICD-10-CM | POA: Diagnosis not present

## 2017-04-03 DIAGNOSIS — I469 Cardiac arrest, cause unspecified: Secondary | ICD-10-CM | POA: Diagnosis not present

## 2017-04-03 DIAGNOSIS — I10 Essential (primary) hypertension: Secondary | ICD-10-CM | POA: Diagnosis not present

## 2017-04-03 DIAGNOSIS — Z9641 Presence of insulin pump (external) (internal): Secondary | ICD-10-CM | POA: Diagnosis not present

## 2017-04-03 DIAGNOSIS — Z4681 Encounter for fitting and adjustment of insulin pump: Secondary | ICD-10-CM | POA: Diagnosis not present

## 2017-04-03 DIAGNOSIS — E1165 Type 2 diabetes mellitus with hyperglycemia: Secondary | ICD-10-CM | POA: Diagnosis not present

## 2017-04-03 DIAGNOSIS — R3129 Other microscopic hematuria: Secondary | ICD-10-CM | POA: Diagnosis not present

## 2017-04-03 DIAGNOSIS — I340 Nonrheumatic mitral (valve) insufficiency: Secondary | ICD-10-CM | POA: Diagnosis not present

## 2017-04-03 DIAGNOSIS — N3001 Acute cystitis with hematuria: Secondary | ICD-10-CM | POA: Diagnosis not present

## 2017-04-03 LAB — PAPIG, HPV, RFX 16/18
HPV, high-risk: NEGATIVE
PAP Smear Comment: 0

## 2017-04-04 ENCOUNTER — Encounter: Payer: Self-pay | Admitting: Obstetrics and Gynecology

## 2017-04-10 DIAGNOSIS — R69 Illness, unspecified: Secondary | ICD-10-CM | POA: Diagnosis not present

## 2017-04-10 DIAGNOSIS — Z9641 Presence of insulin pump (external) (internal): Secondary | ICD-10-CM | POA: Diagnosis not present

## 2017-04-10 DIAGNOSIS — I2510 Atherosclerotic heart disease of native coronary artery without angina pectoris: Secondary | ICD-10-CM | POA: Diagnosis not present

## 2017-04-10 DIAGNOSIS — I469 Cardiac arrest, cause unspecified: Secondary | ICD-10-CM | POA: Diagnosis not present

## 2017-04-10 DIAGNOSIS — Z4681 Encounter for fitting and adjustment of insulin pump: Secondary | ICD-10-CM | POA: Diagnosis not present

## 2017-04-10 DIAGNOSIS — I10 Essential (primary) hypertension: Secondary | ICD-10-CM | POA: Diagnosis not present

## 2017-04-10 DIAGNOSIS — R3129 Other microscopic hematuria: Secondary | ICD-10-CM | POA: Diagnosis not present

## 2017-04-10 DIAGNOSIS — I340 Nonrheumatic mitral (valve) insufficiency: Secondary | ICD-10-CM | POA: Diagnosis not present

## 2017-04-10 DIAGNOSIS — E118 Type 2 diabetes mellitus with unspecified complications: Secondary | ICD-10-CM | POA: Diagnosis not present

## 2017-04-10 DIAGNOSIS — E1165 Type 2 diabetes mellitus with hyperglycemia: Secondary | ICD-10-CM | POA: Diagnosis not present

## 2017-04-11 DIAGNOSIS — R69 Illness, unspecified: Secondary | ICD-10-CM | POA: Diagnosis not present

## 2017-04-21 DIAGNOSIS — H2513 Age-related nuclear cataract, bilateral: Secondary | ICD-10-CM | POA: Diagnosis not present

## 2017-05-01 DIAGNOSIS — R69 Illness, unspecified: Secondary | ICD-10-CM | POA: Diagnosis not present

## 2017-05-03 DIAGNOSIS — B351 Tinea unguium: Secondary | ICD-10-CM | POA: Diagnosis not present

## 2017-05-03 DIAGNOSIS — L600 Ingrowing nail: Secondary | ICD-10-CM | POA: Diagnosis not present

## 2017-05-03 DIAGNOSIS — M79675 Pain in left toe(s): Secondary | ICD-10-CM | POA: Diagnosis not present

## 2017-05-03 DIAGNOSIS — E119 Type 2 diabetes mellitus without complications: Secondary | ICD-10-CM | POA: Diagnosis not present

## 2017-05-03 DIAGNOSIS — M79674 Pain in right toe(s): Secondary | ICD-10-CM | POA: Diagnosis not present

## 2017-05-07 ENCOUNTER — Encounter: Payer: Self-pay | Admitting: *Deleted

## 2017-05-07 ENCOUNTER — Emergency Department
Admission: EM | Admit: 2017-05-07 | Discharge: 2017-05-07 | Disposition: A | Discharge status: Home / Self Care | Payer: Medicare HMO | Attending: Emergency Medicine | Admitting: Emergency Medicine

## 2017-05-07 ENCOUNTER — Other Ambulatory Visit: Payer: Self-pay

## 2017-05-07 DIAGNOSIS — J449 Chronic obstructive pulmonary disease, unspecified: Secondary | ICD-10-CM | POA: Diagnosis not present

## 2017-05-07 DIAGNOSIS — Z87891 Personal history of nicotine dependence: Secondary | ICD-10-CM | POA: Diagnosis not present

## 2017-05-07 DIAGNOSIS — Z79899 Other long term (current) drug therapy: Secondary | ICD-10-CM | POA: Diagnosis not present

## 2017-05-07 DIAGNOSIS — Z794 Long term (current) use of insulin: Secondary | ICD-10-CM | POA: Insufficient documentation

## 2017-05-07 DIAGNOSIS — Z7982 Long term (current) use of aspirin: Secondary | ICD-10-CM | POA: Diagnosis not present

## 2017-05-07 DIAGNOSIS — R319 Hematuria, unspecified: Secondary | ICD-10-CM | POA: Diagnosis present

## 2017-05-07 DIAGNOSIS — I2510 Atherosclerotic heart disease of native coronary artery without angina pectoris: Secondary | ICD-10-CM | POA: Insufficient documentation

## 2017-05-07 DIAGNOSIS — N3091 Cystitis, unspecified with hematuria: Secondary | ICD-10-CM | POA: Diagnosis not present

## 2017-05-07 DIAGNOSIS — N3001 Acute cystitis with hematuria: Secondary | ICD-10-CM

## 2017-05-07 DIAGNOSIS — Z7901 Long term (current) use of anticoagulants: Secondary | ICD-10-CM | POA: Insufficient documentation

## 2017-05-07 DIAGNOSIS — E109 Type 1 diabetes mellitus without complications: Secondary | ICD-10-CM | POA: Insufficient documentation

## 2017-05-07 LAB — URINALYSIS, COMPLETE (UACMP) WITH MICROSCOPIC: SPECIFIC GRAVITY, URINE: 1.009 (ref 1.005–1.030)

## 2017-05-07 LAB — COMPREHENSIVE METABOLIC PANEL
ALT: 20 U/L (ref 14–54)
AST: 29 U/L (ref 15–41)
Albumin: 4.2 g/dL (ref 3.5–5.0)
Alkaline Phosphatase: 115 U/L (ref 38–126)
Anion gap: 6 (ref 5–15)
BILIRUBIN TOTAL: 0.6 mg/dL (ref 0.3–1.2)
BUN: 23 mg/dL — AB (ref 6–20)
CO2: 29 mmol/L (ref 22–32)
Calcium: 9.3 mg/dL (ref 8.9–10.3)
Chloride: 99 mmol/L — ABNORMAL LOW (ref 101–111)
Creatinine, Ser: 0.83 mg/dL (ref 0.44–1.00)
GFR calc Af Amer: >60 mL/min (ref >60)
Glucose, Bld: 155 mg/dL — ABNORMAL HIGH (ref 65–99)
Potassium: 4.4 mmol/L (ref 3.5–5.1)
Sodium: 134 mmol/L — ABNORMAL LOW (ref 135–145)
TOTAL PROTEIN: 7.5 g/dL (ref 6.5–8.1)

## 2017-05-07 LAB — CBC
HEMATOCRIT: 40 % (ref 35.0–47.0)
Hemoglobin: 13 g/dL (ref 12.0–16.0)
MCH: 26.7 pg (ref 26.0–34.0)
MCHC: 32.6 g/dL (ref 32.0–36.0)
MCV: 81.8 fL (ref 80.0–100.0)
Platelets: 266 10*3/uL (ref 150–440)
RBC: 4.89 MIL/uL (ref 3.80–5.20)
RDW: 16.9 % — AB (ref 11.5–14.5)
WBC: 7.2 10*3/uL (ref 3.6–11.0)

## 2017-05-07 MED ORDER — SULFAMETHOXAZOLE-TRIMETHOPRIM 800-160 MG PO TABS
1.0000 | ORAL_TABLET | Freq: Once | ORAL | Status: AC
  Administered 2017-05-07: 1 via ORAL
  Filled 2017-05-07: qty 1

## 2017-05-07 MED ORDER — SULFAMETHOXAZOLE-TRIMETHOPRIM 800-160 MG PO TABS: 1.0000 | ORAL_TABLET | Freq: Two times a day (BID) | ORAL | 0 refills | Status: AC

## 2017-05-07 NOTE — ED Provider Notes (Addendum)
[facility] Emergency Department Provider Note  ____________________________________________  Time seen: Approximately 4:35 PM  I have reviewed the triage vital signs and the nursing notes.   HISTORY  Chief Complaint Hematuria   HPI Lisa [name] is a 67 y.o. female with several comorbidities as listed below on Plavix, history of kidney stones and type I diabetic who presents for evaluation of hematuria.  Patient reports bright red blood mixed with the urine since yesterday.  She has had several episodes in the last 24 hours. Symptoms are ongoing and not improving.  No blood clots. She denies any prior history of hematuria.  She endorses mild discomfort when she urinates.  She denies flank pain, abdominal pain, fever chills, nausea or vomiting, dizziness, chest pain or shortness of breath.  She denies personal or family history of renal or bladder cancer.  She is not a smoker.   Past Medical History:  Diagnosis Date  . CAD (coronary artery disease)    a. 1999 PTCA @ [facility]; b. NSTEMI/Cath 10/11/2016: LM 20%, oLAD 40%, p-mLAD 40%, dLAD 40%, p-mLCx 95%, pRCA 30%, EF 55-65%; c. 10/2016 CT Surgery->rec PCI due to poor lung fxn. d. 12/02/16 DES to mid RCA.  . Groin hematoma    a. 10/2016 Spont groin hematoma w/ anemia req 1u prbcs.  . High cholesterol   . History of kidney stones   . Mucus plugging of bronchi    a. 10/2016 RLL Bronchus obstruction->bronchus vs mass;  b. 10/2016 CT Chest: resolution of RLL mucous plug.  . NSTEMI (non-ST elevated myocardial infarction) ([place]) 10/11/2016  . PAF (paroxysmal atrial fibrillation) ([place])    a. 12/2016 Noted during diag cath--CHA2DS2VASc = 3. No OAC in setting of need for DAPT and h/o spont Thigh Hematoma in 10/2016.  Marland Kitchen Pericardial effusion    a. 10/2016 Echo: EF 60-65%, no rwma, Gr2 DD, small to mod pericardial eff. No tamponade; b. 12/2016 Echo: EF 60-65%, no rwma, Gr1 DD, 1.2 to 1.4 cm mod to large circumferential pericardial  effusion w/o tamponade.  . Pneumonia 02/2016   "couple times before 02/2016 too" (12/02/2016)  . PVD (peripheral vascular disease) ([place])    a. Abdominal aortogram 10/11/16: Infrarenal abdominal aorta heavily calcified & ectatic with approximately 40-50% stenosis at the bifurcation. Mild diffuse disease w/ heavy calcification noted in the common & external iliac arteries bilaterally  . Type I diabetes mellitus ([place])    a. On insulin pump.(12/02/2016)  . Ventricular fibrillation ([place])    a. 10/11/2016: Felt to be secondary to demand ischemia in the setting of underlying CAD and anaphylactic reaction from IV Rocephin; b. 10/2016 Echo: EF 55-60%, ? mild inf HK; c. 10/2016 Seen by EP: No ICD indication.    Patient Active Problem List   Diagnosis Date Noted  . Pericardial effusion 01/20/2017  . Chest pain 12/08/2016  . Paroxysmal atrial fibrillation (HCC)   . Coronary artery disease involving native coronary artery of native heart without angina pectoris 12/02/2016  . Pneumonia due to infectious organism 11/25/2016  . Hyperlipidemia LDL goal <70 11/25/2016  . Peripheral vascular disease ([place]) 11/25/2016  . Atrial fibrillation with rapid ventricular response ([place])   . COPD exacerbation ([place]) 10/25/2016  . Acute respiratory failure ([place])   . Coronary artery disease   . Kidney stone   . Shock liver 10/13/2016  . Hypomagnesemia 10/13/2016  . Ventricular fibrillation ([place]) 10/13/2016  . Hyponatremia 10/13/2016  . Non-ST elevation (NSTEMI) myocardial infarction ([place]) 10/12/2016  . Renal stone 10/12/2016  .  Leukocytosis 10/12/2016  . AKI (acute kidney injury) ([place]) 10/12/2016  . Respiratory failure ([place]) 10/12/2016  . Cardiogenic shock ([place]) 10/12/2016  . Anaphylaxis 10/12/2016  . Left ureteral stone   . Cardiac arrest with ventricular fibrillation ([place]) 10/11/2016    Past Surgical History:  Procedure Laterality Date  . ABDOMINAL AORTOGRAM N/A 10/11/2016   Procedure: ABDOMINAL AORTOGRAM;   Surgeon: Nelva Bush, MD;  Location: [facility];  Service: Cardiovascular;  Laterality: N/A;  . CARDIAC CATHETERIZATION  10/2016  . CERVICAL BIOPSY  W/ LOOP ELECTRODE EXCISION  03/05/2012  . COLONOSCOPY    . COLPOSCOPY  01/17/2011  . CORONARY ANGIOPLASTY WITH STENT PLACEMENT  1999; 12/02/2016   "1 stent  + 1 stent"  . CORONARY ATHERECTOMY N/A 12/02/2016   Procedure: CORONARY ATHERECTOMY - CSI;  Surgeon: Nelva Bush, MD;  Location: [place];  Service: Cardiovascular;  Laterality: N/A;  . CORONARY STENT INTERVENTION N/A 12/02/2016   Procedure: CORONARY STENT INTERVENTION;  Surgeon: Nelva Bush, MD;  Location: [place];  Service: Cardiovascular;  Laterality: N/A;  . LEFT HEART CATH AND CORONARY ANGIOGRAPHY N/A 10/11/2016   Procedure: LEFT HEART CATH AND CORONARY ANGIOGRAPHY;  Surgeon: Nelva Bush, MD;  Location: [facility];  Service: Cardiovascular;  Laterality: N/A;  . NASAL SINUS SURGERY  ~ 2009  . TEMPORARY PACEMAKER N/A 12/02/2016   Procedure: TEMPORARY PACEMAKER;  Surgeon: Nelva Bush, MD;  Location: [facility];  Service: Cardiovascular;  Laterality: N/A;  . TONSILLECTOMY      Prior to Admission medications   Medication Sig Start Date End Date Taking? Authorizing Provider  ALPRAZolam (XANAX) 0.25 MG tablet Take 0.25 mg by mouth 2 (two) times daily as needed (for anxiety.).     [provider]  aspirin EC 81 MG tablet Take 81 mg by mouth daily.    [provider]  atorvastatin (LIPITOR) 40 MG tablet Take 20 mg by mouth at bedtime.    [provider]  clopidogrel (PLAVIX) 75 MG tablet Take 1 tablet (75 mg total) by mouth daily. 11/23/16   End, Harrell Gave, MD  diltiazem (CARDIZEM CD) 240 MG 24 hr capsule Take 1 capsule (240 mg total) by mouth daily. 12/27/16 03/27/17  End, Harrell Gave, MD  diltiazem (CARDIZEM) 30 MG tablet One tablet every 6 hours as needed for heart rate >100 Patient taking  differently: Take 30 mg by mouth every 6 (six) hours as needed (for HR>100). One tablet every 6 hours as needed for heart rate >100 11/02/16   Theora Gianotti, NP  ibuprofen (ADVIL,MOTRIN) 200 MG tablet Take 400 mg by mouth daily.    [provider]  Insulin Human (INSULIN PUMP) SOLN Inject into the skin. HUMALOG 100 UNITS/ML WITH INSULIN PUMP    [provider]  insulin lispro (HUMALOG) 100 UNIT/ML cartridge Inject into the skin 3 (three) times daily with meals. INSULIN PUMP    [provider]  nitroGLYCERIN (NITROSTAT) 0.4 MG SL tablet Place 1 tablet (0.4 mg total) under the tongue every 5 (five) minutes x 3 doses as needed for chest pain. 10/21/16   Bhagat, Crista Luria, PA  sulfamethoxazole-trimethoprim (BACTRIM DS,SEPTRA DS) 800-160 MG tablet Take 1 tablet by mouth 2 (two) times daily for 7 days. 05/07/17 05/14/17  Rudene Re, MD  terconazole (TERAZOL 3) 0.8 % vaginal cream Place 1 applicator vaginally at bedtime. 03/30/17   Malachy Mood, MD  Vitamin D, Ergocalciferol, (DRISDOL) 50000 units CAPS capsule Take 50,000 Units once a week  by mouth.    [provider]    Allergies Ceftriaxone; Penicillins; Rocephin [ceftriaxone sodium in dextrose]; and Albuterol  Family History  Problem Relation Age of Onset  . Hypertension Mother   . Aortic aneurysm Father   . Hypertension Brother   . Lung disease Neg Hx   . Rheumatologic disease Neg Hx     Social History Social History   Tobacco Use  . Smoking status: Former Smoker    Packs/day: 0.25    Years: 47.00    Pack years: 11.75    Types: Cigarettes    Start date: 01/25/1969    Last attempt to quit: 10/11/2016    Years since quitting: 0.5  . Smokeless tobacco: Never Used  . Tobacco comment: 12/16/2016 Wants to quit, has not set quit date.  Has been informed about resources available for smoking cessation  Substance Use Topics  . Alcohol use: No    Alcohol/week: 0.0 oz    Frequency:  Never  . Drug use: No    Review of Systems  Constitutional: Negative for fever. Eyes: Negative for visual changes. ENT: Negative for sore throat. Neck: No neck pain  Cardiovascular: Negative for chest pain. Respiratory: Negative for shortness of breath. Gastrointestinal: Negative for abdominal pain, vomiting or diarrhea. Genitourinary: + dysuria and hematuria Musculoskeletal: Negative for back pain. Skin: Negative for rash. Neurological: Negative for headaches, weakness or numbness. Psych: No SI or HI  ____________________________________________   PHYSICAL EXAM:  VITAL SIGNS: ED Triage Vitals  Enc Vitals Group     BP 05/07/17 1535 123/62     Pulse Rate 05/07/17 1535 70     Resp 05/07/17 1535 18     Temp 05/07/17 1535 97.7 F (36.5 C)     Temp Source 05/07/17 1535 Oral     SpO2 05/07/17 1535 100 %     Weight 05/07/17 1535 125 lb (56.7 kg)     Height 05/07/17 1535 5\' 3"  (1.6 m)     Head Circumference --      Peak Flow --      Pain Score 05/07/17 1546 0     Pain Loc --      Pain Edu? --      Excl. in [place]? --     Constitutional: Alert and oriented. Well appearing and in no apparent distress. HEENT:      Head: Normocephalic and atraumatic.         Eyes: Conjunctivae are normal. Sclera is non-icteric.       Mouth/Throat: Mucous membranes are moist.       Neck: Supple with no signs of meningismus. Cardiovascular: Regular rate and rhythm. No murmurs, gallops, or rubs. 2+ symmetrical distal pulses are present in all extremities. No JVD. Respiratory: Normal respiratory effort. Lungs are clear to auscultation bilaterally. No wheezes, crackles, or rhonchi.  Gastrointestinal: Soft, non tender, and non distended with positive bowel sounds. No rebound or guarding. Genitourinary: No CVA tenderness. Musculoskeletal: Nontender with normal range of motion in all extremities. No edema, cyanosis, or erythema of extremities. Neurologic: Normal speech and language. Face is symmetric.  Moving all extremities. No gross focal neurologic deficits are appreciated. Skin: Skin is warm, dry and intact. No rash noted. Psychiatric: Mood and affect are normal. Speech and behavior are normal.  ____________________________________________   LABS (all labs ordered are listed, but only abnormal results are displayed)  Labs Reviewed  URINALYSIS, COMPLETE (UACMP) WITH MICROSCOPIC - Abnormal; Notable for the following components:  Result Value   Color, Urine RED (*)    APPearance HAZY (*)    Glucose, UA   (*)    Value: TEST NOT REPORTED DUE TO COLOR INTERFERENCE OF URINE PIGMENT   Hgb urine dipstick   (*)    Value: TEST NOT REPORTED DUE TO COLOR INTERFERENCE OF URINE PIGMENT   Bilirubin Urine   (*)    Value: TEST NOT REPORTED DUE TO COLOR INTERFERENCE OF URINE PIGMENT   Ketones, ur   (*)    Value: TEST NOT REPORTED DUE TO COLOR INTERFERENCE OF URINE PIGMENT   Protein, ur   (*)    Value: TEST NOT REPORTED DUE TO COLOR INTERFERENCE OF URINE PIGMENT   Nitrite   (*)    Value: TEST NOT REPORTED DUE TO COLOR INTERFERENCE OF URINE PIGMENT   Leukocytes, UA   (*)    Value: TEST NOT REPORTED DUE TO COLOR INTERFERENCE OF URINE PIGMENT   Bacteria, UA MANY (*)    Squamous Epithelial / LPF 0-5 (*)    All other components within normal limits  CBC - Abnormal; Notable for the following components:   RDW 16.9 (*)    All other components within normal limits  COMPREHENSIVE METABOLIC PANEL - Abnormal; Notable for the following components:   Sodium 134 (*)    Chloride 99 (*)    Glucose, Bld 155 (*)    BUN 23 (*)    All other components within normal limits  URINE CULTURE   ____________________________________________  EKG  none ____________________________________________  RADIOLOGY  none  ____________________________________________   PROCEDURES  Procedure(s) performed: None Procedures Critical Care performed:   None ____________________________________________   INITIAL IMPRESSION / ASSESSMENT AND PLAN / ED COURSE   67 y.o. female with several comorbidities as listed below on Plavix, history of kidney stones and type I diabetic who presents for evaluation of hematuria x 24 hours and dysuria.  Patient is hemodynamically stable with hemoglobin of 13.0. UA pending. Ddx UTI, renal cyst, kidney stone, renal/bladder cancer. If UA positive for UTI will treat, if negative with get CT.    _________________________ 6:02 PM on 05/07/2017 -----------------------------------------  UA positive for UTI.  No signs or symptoms of pyelonephritis or sepsis.  Patient remains hemodynamically stable.  Due to antibiotic allergies patient was started on Bactrim.  She was given the first dose here and was discharged home on a 7-day course.  Recommended close follow-up with primary care doctor.  Discussed return precautions for signs and symptoms of pyelonephritis, sepsis, or acute blood loss anemia.   As part of my medical decision making, I reviewed the following data within the [place] notes reviewed and incorporated, Labs reviewed , Notes from prior ED visits and [place] Controlled Substance Database    Pertinent labs & imaging results that were available during my care of the patient were reviewed by me and considered in my medical decision making (see chart for details).    ____________________________________________   FINAL CLINICAL IMPRESSION(S) / ED DIAGNOSES  Final diagnoses:  Acute cystitis with hematuria  Cystitis, acute hemorrhagic      NEW MEDICATIONS STARTED DURING THIS VISIT:  ED Discharge Orders        Ordered    sulfamethoxazole-trimethoprim (BACTRIM DS,SEPTRA DS) 800-160 MG tablet  2 times daily     05/07/17 1759       Note:  This document was prepared using Dragon voice recognition software and may include unintentional dictation errors.  Alfred Levins,  [place], MD 05/07/17 [place], [place], MD 05/07/17 [phone]

## 2017-05-07 NOTE — Discharge Instructions (Addendum)
Return to the ER if you feel dizzy, pass out, develop chest pain, shortness of breath, fatigue or weakness as these may be sign of severe bleeding.  Also return to the ER if you have abdominal pain, flank pain, fever, nausea or vomiting as these may be sign of a worsening infection.  Follow-up with your doctor in 2 days.  Take antibiotics fully as prescribed.

## 2017-05-07 NOTE — ED Triage Notes (Signed)
Patient c/o hematuria since yesterday. Patient denies any pain or history of hematuria.

## 2017-05-09 DIAGNOSIS — I10 Essential (primary) hypertension: Secondary | ICD-10-CM | POA: Diagnosis not present

## 2017-05-09 DIAGNOSIS — N209 Urinary calculus, unspecified: Secondary | ICD-10-CM | POA: Diagnosis not present

## 2017-05-09 DIAGNOSIS — I469 Cardiac arrest, cause unspecified: Secondary | ICD-10-CM | POA: Diagnosis not present

## 2017-05-09 DIAGNOSIS — I2510 Atherosclerotic heart disease of native coronary artery without angina pectoris: Secondary | ICD-10-CM | POA: Diagnosis not present

## 2017-05-09 DIAGNOSIS — N3001 Acute cystitis with hematuria: Secondary | ICD-10-CM | POA: Diagnosis not present

## 2017-05-09 DIAGNOSIS — E1165 Type 2 diabetes mellitus with hyperglycemia: Secondary | ICD-10-CM | POA: Diagnosis not present

## 2017-05-09 DIAGNOSIS — R3129 Other microscopic hematuria: Secondary | ICD-10-CM | POA: Diagnosis not present

## 2017-05-09 DIAGNOSIS — E118 Type 2 diabetes mellitus with unspecified complications: Secondary | ICD-10-CM | POA: Diagnosis not present

## 2017-05-09 DIAGNOSIS — Z4681 Encounter for fitting and adjustment of insulin pump: Secondary | ICD-10-CM | POA: Diagnosis not present

## 2017-05-09 LAB — URINE CULTURE

## 2017-05-13 ENCOUNTER — Emergency Department: Payer: Medicare HMO

## 2017-05-13 ENCOUNTER — Emergency Department
Admission: EM | Admit: 2017-05-13 | Discharge: 2017-05-13 | Disposition: A | Discharge status: Home / Self Care | Payer: Medicare HMO | Attending: Emergency Medicine | Admitting: Emergency Medicine

## 2017-05-13 ENCOUNTER — Encounter: Payer: Self-pay | Admitting: Medical Oncology

## 2017-05-13 DIAGNOSIS — Z7901 Long term (current) use of anticoagulants: Secondary | ICD-10-CM | POA: Insufficient documentation

## 2017-05-13 DIAGNOSIS — I2510 Atherosclerotic heart disease of native coronary artery without angina pectoris: Secondary | ICD-10-CM | POA: Insufficient documentation

## 2017-05-13 DIAGNOSIS — Z87891 Personal history of nicotine dependence: Secondary | ICD-10-CM | POA: Insufficient documentation

## 2017-05-13 DIAGNOSIS — N1330 Unspecified hydronephrosis: Secondary | ICD-10-CM | POA: Diagnosis not present

## 2017-05-13 DIAGNOSIS — E119 Type 2 diabetes mellitus without complications: Secondary | ICD-10-CM | POA: Insufficient documentation

## 2017-05-13 DIAGNOSIS — R310 Gross hematuria: Secondary | ICD-10-CM | POA: Diagnosis not present

## 2017-05-13 DIAGNOSIS — Z7982 Long term (current) use of aspirin: Secondary | ICD-10-CM | POA: Insufficient documentation

## 2017-05-13 DIAGNOSIS — R319 Hematuria, unspecified: Secondary | ICD-10-CM | POA: Diagnosis present

## 2017-05-13 DIAGNOSIS — N132 Hydronephrosis with renal and ureteral calculous obstruction: Secondary | ICD-10-CM | POA: Diagnosis not present

## 2017-05-13 LAB — CBC
HCT: 36.8 % (ref 35.0–47.0)
Hemoglobin: 12.1 g/dL (ref 12.0–16.0)
MCH: 27 pg (ref 26.0–34.0)
MCHC: 32.9 g/dL (ref 32.0–36.0)
MCV: 82.2 fL (ref 80.0–100.0)
PLATELETS: 265 10*3/uL (ref 150–440)
RBC: 4.48 MIL/uL (ref 3.80–5.20)
RDW: 16.2 % — ABNORMAL HIGH (ref 11.5–14.5)
WBC: 6.9 10*3/uL (ref 3.6–11.0)

## 2017-05-13 LAB — BASIC METABOLIC PANEL
Anion gap: 5 (ref 5–15)
BUN: 17 mg/dL (ref 6–20)
CALCIUM: 9.1 mg/dL (ref 8.9–10.3)
CO2: 26 mmol/L (ref 22–32)
CREATININE: 0.85 mg/dL (ref 0.44–1.00)
Chloride: 100 mmol/L — ABNORMAL LOW (ref 101–111)
GFR calc Af Amer: >60 mL/min (ref >60)
GFR calc non Af Amer: >60 mL/min (ref >60)
GLUCOSE: 94 mg/dL (ref 65–99)
Potassium: 4.2 mmol/L (ref 3.5–5.1)
Sodium: 131 mmol/L — ABNORMAL LOW (ref 135–145)

## 2017-05-13 LAB — URINALYSIS, COMPLETE (UACMP) WITH MICROSCOPIC
BACTERIA UA: NONE SEEN
Specific Gravity, Urine: 1.014 (ref 1.005–1.030)
Squamous Epithelial / LPF: NONE SEEN

## 2017-05-13 MED ORDER — IOPAMIDOL (ISOVUE-300) INJECTION 61%
100.0000 mL | Freq: Once | INTRAVENOUS | Status: AC | PRN
  Administered 2017-05-13: 100 mL via INTRAVENOUS

## 2017-05-13 NOTE — ED Notes (Signed)
Pt states she has been on atbx since last Saturday, states saw her PCP on Tuesday and started on

## 2017-05-13 NOTE — Discharge Instructions (Addendum)
As we discussed please start her aspirin therapy once again.  Please call cardiology Monday morning to arrange a follow-up appointment for Monday or Tuesday.  Urology should be calling you to arrange a follow-up appointment for your cystoscopy.  Return to the emergency department for any significant increase in bleeding, lightheadedness, dizziness, pain or fever.

## 2017-05-13 NOTE — ED Notes (Signed)
Urologist in room

## 2017-05-13 NOTE — ED Notes (Signed)
Pt returned from CT °

## 2017-05-13 NOTE — Consult Note (Signed)
Subjective: CC: Gross Hematuria.  Hx: Lisa [name] is a 67 yo WF who I was asked to see in consultation by Dr. Kerman Passey for gross hematuria that has been present since 05/06/17.  She is passing small thin clots and has bright red urine in the morning that becomes the color of grape juice during the day.   She was seen originally on 4/7 and was felt to have a possible UTI but the culture is negative and she has not cleared with anitbiotics.  She has minimal dysuria and no pain or nausea.   She had a CT with contrast today that shows right hydro with high density material in the collecting system and proximal ureter as well as the mid and distal ureter most consistent with clot.  She was seen in 9/18 with left flank pain and had a CT without contrast that showed a 75mm distal stone on the left which she has subsequently passed.   She had a ureteroscopic stone extraction about 20 years ago but has no other GU history.   She had a anaphylactic reaction to Ceftriaxone in 9/18 when she was seen for the stone and had an MI that eventually resulted in the placement of a drug eluting stent on 12/02/17 and she has been on plavix and ASA.  That was held 5 days ago but I don't think Cardiology is aware.   She was seen in consultation by Dr. Nicolette Bang for the stone in 9/18.   She a mild anemia but  has no other associated signs or symptoms.   She is a long term diabetic and smoked for many years.    ROS:  Review of Systems  Genitourinary: Positive for hematuria.  All other systems reviewed and are negative.   Allergies  Allergen Reactions  . Ceftriaxone     Anaphylaxis, cardiac arrest  . Penicillins Anaphylaxis  . Rocephin [Ceftriaxone Sodium In Dextrose] Anaphylaxis  . Albuterol Other (See Comments)    Tachycardia-->Afib. Tolerates Xopenex    Past Medical History:  Diagnosis Date  . CAD (coronary artery disease)    a. 1999 PTCA @ [facility]; b. NSTEMI/Cath 10/11/2016: LM 20%, oLAD 40%, p-mLAD 40%, dLAD 40%,  p-mLCx 95%, pRCA 30%, EF 55-65%; c. 10/2016 CT Surgery->rec PCI due to poor lung fxn. d. 12/02/16 DES to mid RCA.  . Groin hematoma    a. 10/2016 Spont groin hematoma w/ anemia req 1u prbcs.  . High cholesterol   . History of kidney stones   . Mucus plugging of bronchi    a. 10/2016 RLL Bronchus obstruction->bronchus vs mass;  b. 10/2016 CT Chest: resolution of RLL mucous plug.  . NSTEMI (non-ST elevated myocardial infarction) ([place]) 10/11/2016  . PAF (paroxysmal atrial fibrillation) ([place])    a. 12/2016 Noted during diag cath--CHA2DS2VASc = 3. No OAC in setting of need for DAPT and h/o spont Thigh Hematoma in 10/2016.  Marland Kitchen Pericardial effusion    a. 10/2016 Echo: EF 60-65%, no rwma, Gr2 DD, small to mod pericardial eff. No tamponade; b. 12/2016 Echo: EF 60-65%, no rwma, Gr1 DD, 1.2 to 1.4 cm mod to large circumferential pericardial effusion w/o tamponade.  . Pneumonia 02/2016   "couple times before 02/2016 too" (12/02/2016)  . PVD (peripheral vascular disease) ([place])    a. Abdominal aortogram 10/11/16: Infrarenal abdominal aorta heavily calcified & ectatic with approximately 40-50% stenosis at the bifurcation. Mild diffuse disease w/ heavy calcification noted in the common & external iliac arteries bilaterally  . Type I diabetes mellitus (  [place])    a. On insulin pump.(12/02/2016)  . Ventricular fibrillation ([place])    a. 10/11/2016: Felt to be secondary to demand ischemia in the setting of underlying CAD and anaphylactic reaction from IV Rocephin; b. 10/2016 Echo: EF 55-60%, ? mild inf HK; c. 10/2016 Seen by EP: No ICD indication.    Past Surgical History:  Procedure Laterality Date  . ABDOMINAL AORTOGRAM N/A 10/11/2016   Procedure: ABDOMINAL AORTOGRAM;  Surgeon: Nelva Bush, MD;  Location: [facility];  Service: Cardiovascular;  Laterality: N/A;  . CARDIAC CATHETERIZATION  10/2016  . CERVICAL BIOPSY  W/ LOOP ELECTRODE EXCISION  03/05/2012  . COLONOSCOPY    . COLPOSCOPY  01/17/2011  . CORONARY  ANGIOPLASTY WITH STENT PLACEMENT  1999; 12/02/2016   "1 stent  + 1 stent"  . CORONARY ATHERECTOMY N/A 12/02/2016   Procedure: CORONARY ATHERECTOMY - CSI;  Surgeon: Nelva Bush, MD;  Location: [facility];  Service: Cardiovascular;  Laterality: N/A;  . CORONARY STENT INTERVENTION N/A 12/02/2016   Procedure: CORONARY STENT INTERVENTION;  Surgeon: Nelva Bush, MD;  Location: [facility];  Service: Cardiovascular;  Laterality: N/A;  . LEFT HEART CATH AND CORONARY ANGIOGRAPHY N/A 10/11/2016   Procedure: LEFT HEART CATH AND CORONARY ANGIOGRAPHY;  Surgeon: Nelva Bush, MD;  Location: [facility];  Service: Cardiovascular;  Laterality: N/A;  . NASAL SINUS SURGERY  ~ 2009  . TEMPORARY PACEMAKER N/A 12/02/2016   Procedure: TEMPORARY PACEMAKER;  Surgeon: Nelva Bush, MD;  Location: [facility];  Service: Cardiovascular;  Laterality: N/A;  . TONSILLECTOMY      Social History   Socioeconomic History  . Marital status: Married    Spouse name: Not on file  . Number of children: Not on file  . Years of education: Not on file  . Highest education level: Not on file  Occupational History  . Not on file  Social Needs  . Financial resource strain: Not on file  . Food insecurity:    Worry: Not on file    Inability: Not on file  . Transportation needs:    Medical: Not on file    Non-medical: Not on file  Tobacco Use  . Smoking status: Former Smoker    Packs/day: 0.25    Years: 47.00    Pack years: 11.75    Types: Cigarettes    Start date: 01/25/1969    Last attempt to quit: 10/11/2016    Years since quitting: 0.5  . Smokeless tobacco: Never Used  . Tobacco comment: 12/16/2016 Wants to quit, has not set quit date.  Has been informed about resources available for smoking cessation  Substance and Sexual Activity  . Alcohol use: No    Alcohol/week: 0.0 oz    Frequency: Never  . Drug use: No  . Sexual activity: Yes    Birth control/protection: None   Lifestyle  . Physical activity:    Days per week: 3 days    Minutes per session: 40 min  . Stress: Not at all  Relationships  . Social connections:    Talks on phone: More than three times a week    Gets together: Twice a week    Attends religious service: More than 4 times per year    Active member of club or organization: Yes    Attends meetings of clubs or organizations: More than 4 times per year    Relationship status: Married  . Intimate partner violence:    Fear  of current or ex partner: No    Emotionally abused: No    Physically abused: No    Forced sexual activity: No  Other Topics Concern  . Not on file  Social History Narrative   [facility] (10/14/16):   Patient has primarily done office work. Married for approximately 1 year. Has a dog at home but no bird exposure. No mold exposure.    Family History  Problem Relation Age of Onset  . Hypertension Mother   . Aortic aneurysm Father   . Hypertension Brother   . Lung disease Neg Hx   . Rheumatologic disease Neg Hx     Anti-infectives: Anti-infectives (From admission, onward)   None      No current facility-administered medications for this encounter.    Current Outpatient Medications  Medication Sig Dispense Refill  . ALPRAZolam (XANAX) 0.25 MG tablet Take 0.25 mg by mouth 2 (two) times daily as needed (for anxiety.).     Marland Kitchen atorvastatin (LIPITOR) 40 MG tablet Take 40 mg by mouth at bedtime.     . ciprofloxacin (CIPRO) 250 MG tablet Take 250 mg by mouth 2 (two) times daily.  0  . diltiazem (CARDIZEM CD) 240 MG 24 hr capsule Take 1 capsule (240 mg total) by mouth daily. 90 capsule 3  . Insulin Human (INSULIN PUMP) SOLN Inject into the skin. HUMALOG 100 UNITS/ML WITH INSULIN PUMP    . nitroGLYCERIN (NITROSTAT) 0.4 MG SL tablet Place 1 tablet (0.4 mg total) under the tongue every 5 (five) minutes x 3 doses as needed for chest pain. 25 tablet 12  . sulfamethoxazole-trimethoprim (BACTRIM DS,SEPTRA DS)  800-160 MG tablet Take 1 tablet by mouth 2 (two) times daily for 7 days. 14 tablet 0  . Vitamin D, Ergocalciferol, (DRISDOL) 50000 units CAPS capsule Take 50,000 Units by mouth every 30 (thirty) days.     Marland Kitchen aspirin EC 81 MG tablet Take 81 mg by mouth daily.    . clopidogrel (PLAVIX) 75 MG tablet Take 1 tablet (75 mg total) by mouth daily. (Patient not taking: Reported on 05/13/2017) 90 tablet 3  . diltiazem (CARDIZEM) 30 MG tablet One tablet every 6 hours as needed for heart rate >100 (Patient not taking: Reported on 05/13/2017) 30 tablet 5  . terconazole (TERAZOL 3) 0.8 % vaginal cream Place 1 applicator vaginally at bedtime. (Patient not taking: Reported on 05/13/2017) 20 g 1   I have reviewed her past medical, surgical and social history as well as her meds.   Objective: Vital signs in last 24 hours: Temp:  [97.4 F (36.3 C)] 97.4 F (36.3 C) (04/13 0855) Pulse Rate:  [71-86] 71 (04/13 1048) Resp:  [17-18] 18 (04/13 1048) BP: (127-140)/(73-74) 140/73 (04/13 1048) SpO2:  [100 %] 100 % (04/13 1048) Weight:  [56.7 kg (125 lb)] 56.7 kg (125 lb) (04/13 0855)  Intake/Output from previous day: No intake/output data recorded. Intake/Output this shift: No intake/output data recorded.   Physical Exam  Constitutional: She is oriented to person, place, and time. She appears well-developed and well-nourished.  HENT:  Head: Normocephalic and atraumatic.  Neck: Normal range of motion. Neck supple. No thyromegaly present.  Cardiovascular: Normal rate, regular rhythm and normal heart sounds.  Pulmonary/Chest: Effort normal and breath sounds normal. No respiratory distress.  Abdominal: Soft. She exhibits no mass. There is no tenderness.  Musculoskeletal: Normal range of motion. She exhibits no edema or tenderness.  Lymphadenopathy:    She has no cervical adenopathy.  Neurological: She is alert  and oriented to person, place, and time. Coordination normal.  Skin: Skin is warm and dry.   Psychiatric: She has a normal mood and affect. Judgment normal.  Vitals reviewed.   Lab Results:  Recent Labs    05/13/17 1034  WBC 6.9  HGB 12.1  HCT 36.8  PLT 265   BMET Recent Labs    05/13/17 1034  NA 131*  K 4.2  CL 100*  CO2 26  GLUCOSE 94  BUN 17  CREATININE 0.85  CALCIUM 9.1   PT/INR No results for input(s): LABPROT, INR in the last 72 hours. ABG No results for input(s): PHART, HCO3 in the last 72 hours.  Invalid input(s): PCO2, PO2  Studies/Results: Ct Abdomen Pelvis W Contrast  Result Date: 05/13/2017 CLINICAL DATA:  Hematuria. EXAM: CT ABDOMEN AND PELVIS WITH CONTRAST TECHNIQUE: Multidetector CT imaging of the abdomen and pelvis was performed using the standard protocol following bolus administration of intravenous contrast. CONTRAST:  163mL ISOVUE-300 IOPAMIDOL (ISOVUE-300) INJECTION 61% COMPARISON:  CT abdomen pelvis dated October 11, 2016. FINDINGS: Lower chest: No acute abnormality. Hepatobiliary: No focal liver abnormality is seen. No gallstones, gallbladder wall thickening, or biliary dilatation. Pancreas: Unchanged truncated appearance of the tail. No ductal dilatation or surrounding inflammatory changes. Spleen: Normal in size without focal abnormality. Adrenals/Urinary Tract: The adrenal glands are unremarkable. Punctate nonobstructive left renal calculus. New mild right hydronephrosis and proximal hydroureter without evidence of obstructing calculus or focal lesion. Delayed excretion of contrast from the right kidney. No focal renal mass. The bladder is unremarkable for the degree of distention. Stomach/Bowel: Stomach is within normal limits. Appendix appears normal. No evidence of bowel wall thickening, distention, or inflammatory changes. Vascular/Lymphatic: Extensive aortic atherosclerosis with slight interval increase in size of infrarenal abdominal aorta, now measuring 3.0 cm, previously 2.8 cm. 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 No lymphadenopathy. Reproductive: Calcified uterine fibroid again noted. No adnexal mass. Other: Unchanged small fat containing umbilical hernia. No free fluid or pneumoperitoneum. Musculoskeletal: No acute or significant osseous findings. Stable mild chronic compression fractures of L4 and L5. IMPRESSION: 1. New mild right hydronephrosis and proximal hydroureter without definite obstructing calculus or focal lesion. Delayed excretion of contrast from the right kidney. Consider ureteroscopy for further evaluation. 2. Punctate nonobstructive left nephrolithiasis. 3. Interval increase in size of the infrarenal abdominal aorta, now measuring 3.0 cm, consistent with aneurysm. Recommend followup by ultrasound in 3 years. This recommendation follows ACR consensus guidelines: White Paper of the ACR Incidental Findings Committee II on Vascular Findings. J Am Coll Radiol 2013; 41:740-814 4. Aortic atherosclerosis (ICD10-I70.0). Short segment non flow limiting dissection in the distal infrarenal aorta, unchanged. Electronically Signed   By: Titus Dubin M.D.   On: 05/13/2017 11:36   I have reviewed her current and prior labs and her current and prior CT films and reports,  Her records from 9/18 and 11/18 encounters and discussed her case with Dr. Kerman Passey.   Assessment: Gross hematuria with right hydronephrosis.  She has painless gross hematuria with a negative culture on 4/7.  She has been off of plavix and ASA for 5 days without resolution.  There are hyperdense filling defects in the proximal ureter with some hyperdense material in the collecting system that could be clots.   The source could be a urothelial neoplasm or possibly papillary necrosis with her history of DM.   She will need cystoscopy with retrograde pyelography and ureteroscopy with possible stent.   I will  try to get that arranged for next week on an elective basis.   I have reviewed the  Risks of bleeding, infection,  ureteral injury, need for a stent and secondary procedures, thrombotic events and anesthetic complications.   History of nephrolithiasis.   She has passed the left distal stone that was present in 9/18 and there are no new stones noted.  Anemia.  Her hemaglobin is down from 13 to 12.1 over the past week but the bleeding is associated with only a small amount of clot.   CAD with recent drug eluting stent placement on 12/02/17.  She has been off of the plavix and ASA for 5 days without resolution of the bleeding.  Cardiology needs to notified to get clearance for surgery.   It will probably be best to maintain the plavix and ASA pending evaluation since the bleeding has persisted despite cessation of antiplatelet therapy and her recent drug eluting stent placement on 12/02/16.   Cardiology recommends resumption of ASA so that will be conferred to the patient.    CC: Dr. Harvest Dark, Dr. Belinda Fisher and Dr. Harrell Gave End.       Irine Seal 05/13/2017 [phone]

## 2017-05-13 NOTE — ED Triage Notes (Signed)
Pt reports she began having hematuria last Saturday, was seen here Sunday and put on abx. Pt reports that the hematuria has continued, seen here pcp and placed on another abx without relief. Pt denies any pain. Denies any other sx's. NAD noted.

## 2017-05-13 NOTE — ED Provider Notes (Signed)
[facility] Emergency Department Provider Note  Time seen: 10:31 AM  I have reviewed the triage vital signs and the nursing notes.   HISTORY  Chief Complaint Hematuria    HPI Lisa [name] is a 67 y.o. female with a past medical history of CAD status post stents on aspirin, Plavix, hyperlipidemia, kidney stones, DM, presents to the emergency department with hematuria.  According to the patient for the past 1 week she has been experiencing hematuria.  Denies any dysuria or back pain.  Patient stopped her aspirin/Plavix 5 days ago but continues to have hematuria.  Patient has seen her doctor as well as been here, she is on 2 different antibiotics but continues to have no improvement in hematuria.  Denies any abdominal pain or fever, nausea or vomiting.  Largely negative review of systems.   Past Medical History:  Diagnosis Date  . CAD (coronary artery disease)    a. 1999 PTCA @ [facility]; b. NSTEMI/Cath 10/11/2016: LM 20%, oLAD 40%, p-mLAD 40%, dLAD 40%, p-mLCx 95%, pRCA 30%, EF 55-65%; c. 10/2016 CT Surgery->rec PCI due to poor lung fxn. d. 12/02/16 DES to mid RCA.  . Groin hematoma    a. 10/2016 Spont groin hematoma w/ anemia req 1u prbcs.  . High cholesterol   . History of kidney stones   . Mucus plugging of bronchi    a. 10/2016 RLL Bronchus obstruction->bronchus vs mass;  b. 10/2016 CT Chest: resolution of RLL mucous plug.  . NSTEMI (non-ST elevated myocardial infarction) ([place]) 10/11/2016  . PAF (paroxysmal atrial fibrillation) ([place])    a. 12/2016 Noted during diag cath--CHA2DS2VASc = 3. No OAC in setting of need for DAPT and h/o spont Thigh Hematoma in 10/2016.  Marland Kitchen Pericardial effusion    a. 10/2016 Echo: EF 60-65%, no rwma, Gr2 DD, small to mod pericardial eff. No tamponade; b. 12/2016 Echo: EF 60-65%, no rwma, Gr1 DD, 1.2 to 1.4 cm mod to large circumferential pericardial effusion w/o tamponade.  . Pneumonia 02/2016   "couple times before 02/2016 too" (12/02/2016)  .  PVD (peripheral vascular disease) ([place])    a. Abdominal aortogram 10/11/16: Infrarenal abdominal aorta heavily calcified & ectatic with approximately 40-50% stenosis at the bifurcation. Mild diffuse disease w/ heavy calcification noted in the common & external iliac arteries bilaterally  . Type I diabetes mellitus ([place])    a. On insulin pump.(12/02/2016)  . Ventricular fibrillation ([place])    a. 10/11/2016: Felt to be secondary to demand ischemia in the setting of underlying CAD and anaphylactic reaction from IV Rocephin; b. 10/2016 Echo: EF 55-60%, ? mild inf HK; c. 10/2016 Seen by EP: No ICD indication.    Patient Active Problem List   Diagnosis Date Noted  . Pericardial effusion 01/20/2017  . Chest pain 12/08/2016  . Paroxysmal atrial fibrillation (HCC)   . Coronary artery disease involving native coronary artery of native heart without angina pectoris 12/02/2016  . Pneumonia due to infectious organism 11/25/2016  . Hyperlipidemia LDL goal <70 11/25/2016  . Peripheral vascular disease ([place]) 11/25/2016  . Atrial fibrillation with rapid ventricular response ([place])   . COPD exacerbation ([place]) 10/25/2016  . Acute respiratory failure ([place])   . Coronary artery disease   . Kidney stone   . Shock liver 10/13/2016  . Hypomagnesemia 10/13/2016  . Ventricular fibrillation ([place]) 10/13/2016  . Hyponatremia 10/13/2016  . Non-ST elevation (NSTEMI) myocardial infarction ([place]) 10/12/2016  . Renal stone 10/12/2016  . Leukocytosis 10/12/2016  . AKI (acute kidney injury) (  [place]) 10/12/2016  . Respiratory failure ([place]) 10/12/2016  . Cardiogenic shock ([place]) 10/12/2016  . Anaphylaxis 10/12/2016  . Left ureteral stone   . Cardiac arrest with ventricular fibrillation ([place]) 10/11/2016    Past Surgical History:  Procedure Laterality Date  . ABDOMINAL AORTOGRAM N/A 10/11/2016   Procedure: ABDOMINAL AORTOGRAM;  Surgeon: Nelva Bush, MD;  Location: [place] [facility];  Service: Cardiovascular;  Laterality:  N/A;  . CARDIAC CATHETERIZATION  10/2016  . CERVICAL BIOPSY  W/ LOOP ELECTRODE EXCISION  03/05/2012  . COLONOSCOPY    . COLPOSCOPY  01/17/2011  . CORONARY ANGIOPLASTY WITH STENT PLACEMENT  1999; 12/02/2016   "1 stent  + 1 stent"  . CORONARY ATHERECTOMY N/A 12/02/2016   Procedure: CORONARY ATHERECTOMY - CSI;  Surgeon: Nelva Bush, MD;  Location: [place] [facility];  Service: Cardiovascular;  Laterality: N/A;  . CORONARY STENT INTERVENTION N/A 12/02/2016   Procedure: CORONARY STENT INTERVENTION;  Surgeon: Nelva Bush, MD;  Location: [place] [facility];  Service: Cardiovascular;  Laterality: N/A;  . LEFT HEART CATH AND CORONARY ANGIOGRAPHY N/A 10/11/2016   Procedure: LEFT HEART CATH AND CORONARY ANGIOGRAPHY;  Surgeon: Nelva Bush, MD;  Location: [place] [facility];  Service: Cardiovascular;  Laterality: N/A;  . NASAL SINUS SURGERY  ~ 2009  . TEMPORARY PACEMAKER N/A 12/02/2016   Procedure: TEMPORARY PACEMAKER;  Surgeon: Nelva Bush, MD;  Location: [place] [facility];  Service: Cardiovascular;  Laterality: N/A;  . TONSILLECTOMY      Prior to Admission medications   Medication Sig Start Date End Date Taking? Authorizing Provider  ALPRAZolam (XANAX) 0.25 MG tablet Take 0.25 mg by mouth 2 (two) times daily as needed (for anxiety.).     [provider]  aspirin EC 81 MG tablet Take 81 mg by mouth daily.    [provider]  atorvastatin (LIPITOR) 40 MG tablet Take 20 mg by mouth at bedtime.    [provider]  clopidogrel (PLAVIX) 75 MG tablet Take 1 tablet (75 mg total) by mouth daily. 11/23/16   End, Harrell Gave, MD  diltiazem (CARDIZEM CD) 240 MG 24 hr capsule Take 1 capsule (240 mg total) by mouth daily. 12/27/16 03/27/17  End, Harrell Gave, MD  diltiazem (CARDIZEM) 30 MG tablet One tablet every 6 hours as needed for heart rate >100 Patient taking differently: Take 30 mg by mouth every 6 (six) hours as needed (for HR>100). One tablet every 6 hours as  needed for heart rate >100 11/02/16   Theora Gianotti, NP  ibuprofen (ADVIL,MOTRIN) 200 MG tablet Take 400 mg by mouth daily.    [provider]  Insulin Human (INSULIN PUMP) SOLN Inject into the skin. HUMALOG 100 UNITS/ML WITH INSULIN PUMP    [provider]  insulin lispro (HUMALOG) 100 UNIT/ML cartridge Inject into the skin 3 (three) times daily with meals. INSULIN PUMP    [provider]  nitroGLYCERIN (NITROSTAT) 0.4 MG SL tablet Place 1 tablet (0.4 mg total) under the tongue every 5 (five) minutes x 3 doses as needed for chest pain. 10/21/16   Bhagat, Crista Luria, PA  sulfamethoxazole-trimethoprim (BACTRIM DS,SEPTRA DS) 800-160 MG tablet Take 1 tablet by mouth 2 (two) times daily for 7 days. 05/07/17 05/14/17  Rudene Re, MD  terconazole (TERAZOL 3) 0.8 % vaginal cream Place 1 applicator vaginally at bedtime. 03/30/17   Malachy Mood, MD  Vitamin D, Ergocalciferol, (DRISDOL) 50000 units CAPS capsule Take 50,000 Units once a week by mouth.    [provider]  Allergies  Allergen Reactions  . Ceftriaxone     Anaphylaxis, cardiac arrest  . Penicillins Anaphylaxis  . Rocephin [Ceftriaxone Sodium In Dextrose] Anaphylaxis  . Albuterol Other (See Comments)    Tachycardia-->Afib. Tolerates Xopenex    Family History  Problem Relation Age of Onset  . Hypertension Mother   . Aortic aneurysm Father   . Hypertension Brother   . Lung disease Neg Hx   . Rheumatologic disease Neg Hx     Social History Social History   Tobacco Use  . Smoking status: Former Smoker    Packs/day: 0.25    Years: 47.00    Pack years: 11.75    Types: Cigarettes    Start date: 01/25/1969    Last attempt to quit: 10/11/2016    Years since quitting: 0.5  . Smokeless tobacco: Never Used  . Tobacco comment: 12/16/2016 Wants to quit, has not set quit date.  Has been informed about resources available for smoking cessation  Substance Use Topics  . Alcohol use:  No    Alcohol/week: 0.0 oz    Frequency: Never  . Drug use: No    Review of Systems Constitutional: Negative for fever. Eyes: Negative for visual complaints ENT: Negative for recent illness Cardiovascular: Negative for chest pain. Respiratory: Negative for shortness of breath. Gastrointestinal: Negative for abdominal pain, vomiting  Genitourinary: Positive for hematuria.  Negative for dysuria. Musculoskeletal: Negative for musculoskeletal complaints Skin: Negative for skin complaints  Neurological: Negative for headache All other ROS negative  ____________________________________________   PHYSICAL EXAM:  VITAL SIGNS: ED Triage Vitals [05/13/17 0855]  Enc Vitals Group     BP 127/74     Pulse Rate 86     Resp 17     Temp (!) 97.4 F (36.3 C)     Temp Source Oral     SpO2 100 %     Weight 125 lb (56.7 kg)     Height 5\' 3"  (1.6 m)     Head Circumference      Peak Flow      Pain Score 0     Pain Loc      Pain Edu?      Excl. in [place]?    Constitutional: Alert and oriented. Well appearing and in no distress. Eyes: Normal exam ENT   Head: Normocephalic and atraumatic.   Mouth/Throat: Mucous membranes are moist. Cardiovascular: Normal rate, regular rhythm. No murmur Respiratory: Normal respiratory effort without tachypnea nor retractions. Breath sounds are clear Gastrointestinal: Soft and nontender. No distention.  There is no CVA tenderness. Musculoskeletal: Nontender with normal range of motion in all extremities.  Neurologic:  Normal speech and language. No gross focal neurologic deficits  Skin:  Skin is warm, dry and intact.  Psychiatric: Mood and affect are normal.   ____________________________________________   RADIOLOGY  CT shows mild right hydronephrosis with hydroureter but no definitive obstructing lesion or calculi.  Slightly increased abdominal aneurysm discussed this with the patient.  ____________________________________________   INITIAL  IMPRESSION / ASSESSMENT AND PLAN / ED COURSE  Pertinent labs & imaging results that were available during my care of the patient were reviewed by me and considered in my medical decision making (see chart for details).  Patient presents to the emergency department for painless hematuria times 1 week.  Differential includes urinary tract infection, ureterolithiasis, renal/bladder carcinoma, AVM.  Patient has been on antibiotics with no improvement making urinary tract infection less likely.  Patient has a history of kidney stones but  denies any back or abdominal pain.  Completely nontender abdomen without CVA tenderness.  Given the painless hematuria we will obtain a CT scan of the abdomen/pelvis with contrast as well as basic lab work.  Patient agreeable to this plan of care.  Patient states she has not yet been referred to urology, patient will require urology referral for likely cystoscopy regardless of findings given 7 days of hematuria without improvement after stopping blood thinners or starting antibiotics.  CT shows no definitive findings, does show slight right hydronephrosis and hydroureter.  Given the patient continues to have hematuria I discussed the patient with Dr. Jeffie Pollock of urology, who is actually coming in to see a different patient but will see this patient as well to discuss further workup including likely cystoscopy/ureteroscopy.  I discussed with the patient she is agreeable to stay to hear from urology.  Dr. Jeffie Pollock is in the emergency department seeing the patient.  As the patient appears so well he believes the patient would be safe for follow-up for a cystoscopy early this week.  I discussed patient with cardiology on-call for Dr. Saunders Revel.  They state they would like the patient to restart the aspirin, we will hold off on Plavix until the patient has obtained her cystoscopy which should be early this coming week.  Patient agreeable to this plan of care.  We will discharge the patient home  with urology and cardiology follow-up.  ____________________________________________   FINAL CLINICAL IMPRESSION(S) / ED DIAGNOSES  Painless hematuria    Harvest Dark, MD 05/13/17 1359

## 2017-05-13 NOTE — ED Notes (Signed)
Informed RN that patient has been roomed and is ready for evaluation.  Patient in NAD at this time and call bell placed within reach.   

## 2017-05-16 ENCOUNTER — Encounter: Payer: Self-pay | Admitting: Nurse Practitioner

## 2017-05-16 ENCOUNTER — Ambulatory Visit: Payer: Medicare HMO | Admitting: Nurse Practitioner

## 2017-05-16 ENCOUNTER — Ambulatory Visit: Payer: Medicare HMO | Admitting: Urology

## 2017-05-16 ENCOUNTER — Encounter: Payer: Self-pay | Admitting: Urology

## 2017-05-16 VITALS — BP 137/66 | HR 109 | Ht 63.0 in | Wt 125.0 lb

## 2017-05-16 VITALS — BP 120/54 | HR 70 | Ht 63.0 in | Wt 129.5 lb

## 2017-05-16 DIAGNOSIS — R310 Gross hematuria: Secondary | ICD-10-CM | POA: Diagnosis not present

## 2017-05-16 DIAGNOSIS — I3139 Other pericardial effusion (noninflammatory): Secondary | ICD-10-CM

## 2017-05-16 DIAGNOSIS — I2510 Atherosclerotic heart disease of native coronary artery without angina pectoris: Secondary | ICD-10-CM | POA: Diagnosis not present

## 2017-05-16 DIAGNOSIS — I10 Essential (primary) hypertension: Secondary | ICD-10-CM | POA: Diagnosis not present

## 2017-05-16 DIAGNOSIS — Z87442 Personal history of urinary calculi: Secondary | ICD-10-CM

## 2017-05-16 DIAGNOSIS — I313 Pericardial effusion (noninflammatory): Secondary | ICD-10-CM

## 2017-05-16 DIAGNOSIS — N1330 Unspecified hydronephrosis: Secondary | ICD-10-CM

## 2017-05-16 DIAGNOSIS — I480 Paroxysmal atrial fibrillation: Secondary | ICD-10-CM

## 2017-05-16 DIAGNOSIS — E785 Hyperlipidemia, unspecified: Secondary | ICD-10-CM | POA: Diagnosis not present

## 2017-05-16 NOTE — Patient Instructions (Signed)
Medication Instructions:  Your physician recommends that you continue on your current medications as directed. Please refer to the Current Medication list given to you today.   Labwork: none  Testing/Procedures: none  Follow-Up: Your physician recommends that you schedule a follow-up appointment in: as scheduled.   If you need a refill on your cardiac medications before your next appointment, please call your pharmacy.

## 2017-05-16 NOTE — Progress Notes (Signed)
Office Visit    Patient Name: Lisa [name] Date of Encounter: 05/16/2017  Primary Care Provider:  Lenard Simmer, MD Primary Cardiologist:  Nelva Bush, MD  Chief Complaint    67 y/o ? with a history of CAD status post VF arrest in the setting of anaphylaxis (Rocephin for UTI), type 1 diabetes mellitus on insulin pump, peripheral vascular disease, abdominal aortic aneurysm, spontaneous groin hematoma with anemia requiring transfusion in September 2018, right lower lobe bronchus obstruction/mucous plugging in September 2018, nephrolithiasis, pericardial effusion, paroxysmal atrial fibrillation, and hyperlipidemia, who presents for follow-up related to recent development of hematuria.  Past Medical History    Past Medical History:  Diagnosis Date  . CAD (coronary artery disease)    a. 1999 PTCA @ [facility]; b. NSTEMI/Cath 10/11/2016: LM 20%, oLAD 40%, p-mLAD 40%, dLAD 40%, p-mLCx 95%, pRCA 30%, 95/87m, EF 55-65%; c. 10/2016 CT Surgery->rec PCI due to poor lung fxn. d. 12/02/16 DES to mid RCA.  . Groin hematoma    a. 10/2016 Spont groin hematoma w/ anemia req 1u prbcs.  . High cholesterol   . History of kidney stones   . Mucus plugging of bronchi    a. 10/2016 RLL Bronchus obstruction->bronchus vs mass;  b. 10/2016 CT Chest: resolution of RLL mucous plug.  . NSTEMI (non-ST elevated myocardial infarction) ([place]) 10/11/2016  . PAF (paroxysmal atrial fibrillation) ([place])    a. 12/2016 Noted during diag cath--CHA2DS2VASc = 3. No OAC in setting of need for DAPT and h/o spont Thigh Hematoma in 10/2016.  Marland Kitchen Pericardial effusion    a. 10/2016 Echo: EF 60-65%, no rwma, Gr2 DD, small to mod pericardial eff. No tamponade; b. 12/2016 Echo: EF 60-65%, no rwma, Gr1 DD, 1.2 to 1.4 cm mod to large circumferential pericardial effusion w/o tamponade; c. 12/2016 Echo: EF 55-60%, no rwma, triv effusion.  . Pneumonia 02/2016   "couple times before 02/2016 too" (12/02/2016)  . PVD (peripheral vascular disease)  ([place])    a. Abdominal aortogram 10/11/16: Infrarenal abdominal aorta heavily calcified & ectatic with approximately 40-50% stenosis at the bifurcation. Mild diffuse disease w/ heavy calcification noted in the common & external iliac arteries bilaterally  . Type I diabetes mellitus ([place])    a. On insulin pump.(12/02/2016)  . Ventricular fibrillation ([place])    a. 10/11/2016: Felt to be secondary to demand ischemia in the setting of underlying CAD and anaphylactic reaction from IV Rocephin; b. 10/2016 Echo: EF 55-60%, ? mild inf HK; c. 10/2016 Seen by EP: No ICD indication.   Past Surgical History:  Procedure Laterality Date  . ABDOMINAL AORTOGRAM N/A 10/11/2016   Procedure: ABDOMINAL AORTOGRAM;  Surgeon: Nelva Bush, MD;  Location: [place] [facility];  Service: Cardiovascular;  Laterality: N/A;  . CARDIAC CATHETERIZATION  10/2016  . CERVICAL BIOPSY  W/ LOOP ELECTRODE EXCISION  03/05/2012  . COLONOSCOPY    . COLPOSCOPY  01/17/2011  . CORONARY ANGIOPLASTY WITH STENT PLACEMENT  1999; 12/02/2016   "1 stent  + 1 stent"  . CORONARY ATHERECTOMY N/A 12/02/2016   Procedure: CORONARY ATHERECTOMY - CSI;  Surgeon: Nelva Bush, MD;  Location: [place] [facility];  Service: Cardiovascular;  Laterality: N/A;  . CORONARY STENT INTERVENTION N/A 12/02/2016   Procedure: CORONARY STENT INTERVENTION;  Surgeon: Nelva Bush, MD;  Location: [place] [facility];  Service: Cardiovascular;  Laterality: N/A;  . LEFT HEART CATH AND CORONARY ANGIOGRAPHY N/A 10/11/2016   Procedure: LEFT HEART CATH AND CORONARY ANGIOGRAPHY;  Surgeon: Nelva Bush, MD;  Location: [place] [facility];  Service: Cardiovascular;  Laterality: N/A;  . NASAL SINUS SURGERY  ~ 2009  . TEMPORARY PACEMAKER N/A 12/02/2016   Procedure: TEMPORARY PACEMAKER;  Surgeon: Nelva Bush, MD;  Location: [place] [facility];  Service: Cardiovascular;  Laterality: N/A;  . TONSILLECTOMY     Allergies  Allergies  Allergen Reactions  . Ceftriaxone       Anaphylaxis, cardiac arrest  . Penicillins Anaphylaxis  . Rocephin [Ceftriaxone Sodium In Dextrose] Anaphylaxis  . Albuterol Other (See Comments)    Tachycardia-->Afib. Tolerates Xopenex    History of Present Illness    67 year old female with the above complex past medical history.  In September 2018, she presented to [facility] with flank pain and nausea.  She was diagnosed with urinary tract infection and treated with intravenous Rocephin in the emergency department.  She subsequently developed bradycardia and ventricular fibrillation.  She required intubation and CPR times 45 minutes with eventual ROSC.  She was admitted to the intensive care unit and ruled in for non-STEMI, peaking her troponin at 7.22.  Echocardiogram showed normal LV function with possible inferior hypokinesis.  Catheterization was performed and showed severe left circumflex and RCA disease along with an infrarenal abdominal aortic aneurysm and aortic atherosclerosis.  She had paroxysmal atrial fibrillation during the case.  She was transferred to [facility] and underwent CT surgical evaluation was felt to be a poor candidate due to poor lung function.  High risk PCI was felt to be more appropriate though it was deferred in the setting of development of right groin hematoma and anemia requiring 1 unit transfusion.  Following stabilization, she was discharged with plan for outpatient follow-up and eventual PCI.  She was readmitted to [place] [facility] due to progressive dyspnea and was found to have new right lower lobe bronchus obstruction with question of mucous plugging versus mass.  Right lower lobe opacities and pericardial effusion concerning for hemopericardium were also noted.  Echocardiogram showed mild to moderate pericardial effusion without tamponade.  She was seen by pulmonology with initiation of antibiotics and incentive spirometry with flutter valve, with subsequent improvement in breathing and hypoxia.  She  was again noted to have intermittent atrial fibrillation and was placed on short acting diltiazem.  Given recent spontaneous groin hematoma, she was not felt to be a candidate for oral anticoagulation.  She followed up with pulmonology and cardiology in the outpatient setting.  Follow-up CT showed resolution of endobronchial lesion and postobstructive pneumonia.  In that setting, she was felt to be stable for PCI.  She was placed on aspirin and Plavix prior to PCI to assess for any recurrent bleeding or anemia.  Blood counts were stable and she subsequently underwent PCI and drug-eluting stent placement to the mid RCA in early November.  LCX dzs has been medically managed.  She was subsequently readmitted to [place] in November 2018 with left-sided chest pain.  CT of the chest showed worsening pericardial effusion.  Echo showed a moderate to large circumferential pericardial effusion without tamponade.  She had serial follow-up limited echos following discharge, with almost complete resolution by echo on January 19, 2017.  She was last seen in clinic in Feb and was stable.  From a cardiac standpoint, she has been doing exceptionally well.  She is going to a gym 3 times a week and walking without chest pain or dyspnea.  She has not smoked in many months.  About 10 days ago, she started to experience hematuria.  She was seen in the emergency department on April 7 and diagnosed with a urinary tract infection.  She was prescribed Bactrim and advised to follow-up with her primary care provider.  She followed up with primary care and continues to have hematuria and as a result, she was told to discontinue aspirin and Plavix.  She was also prescribed Cipro to take in conjunction with Bactrim.  By Saturday, April 13, hematuria worsened and she presented back to the emergency department.  A CT was performed and showed slight right hydronephrosis and hydroureter.  Her case was discussed with urology and  outpatient urology follow-up was recommended.  Cardiology was also curb sided with recommendation to resume aspirin.  It was felt she could remain off of Plavix.  The following day, April 14, she noted no further hematuria.  She followed up with urology this morning and will tentatively have a repeat CT within the next week or so with urology follow-up on May 7.  It is possible she will require cystoscopy/ureteroscopy following CT.  She has had no further hematuria and again, denies any chest pain, dyspnea, PND, orthopnea, dizziness, syncope, edema, or early satiety.  Home Medications    Prior to Admission medications   Medication Sig Start Date End Date Taking? Authorizing Provider  ALPRAZolam (XANAX) 0.25 MG tablet Take 0.25 mg by mouth 2 (two) times daily as needed (for anxiety.).     [provider]  aspirin EC 81 MG tablet Take 81 mg by mouth daily.    [provider]  atorvastatin (LIPITOR) 40 MG tablet Take 40 mg by mouth at bedtime.     [provider]  ciprofloxacin (CIPRO) 250 MG tablet Take 250 mg by mouth 2 (two) times daily. 05/09/17 05/24/17  [provider]  clopidogrel (PLAVIX) 75 MG tablet Take 1 tablet (75 mg total) by mouth daily. 11/23/16   End, Harrell Gave, MD  diltiazem (CARDIZEM CD) 240 MG 24 hr capsule Take 1 capsule (240 mg total) by mouth daily. 12/27/16 05/13/17  End, Harrell Gave, MD  diltiazem (CARDIZEM) 30 MG tablet One tablet every 6 hours as needed for heart rate >100 11/02/16   Theora Gianotti, NP  Insulin Human (INSULIN PUMP) SOLN Inject into the skin. HUMALOG 100 UNITS/ML WITH INSULIN PUMP    [provider]  nitroGLYCERIN (NITROSTAT) 0.4 MG SL tablet Place 1 tablet (0.4 mg total) under the tongue every 5 (five) minutes x 3 doses as needed for chest pain. 10/21/16   Bhagat, Crista Luria, PA  terconazole (TERAZOL 3) 0.8 % vaginal cream Place 1 applicator vaginally at bedtime. 03/30/17   Malachy Mood, MD  Vitamin D,  Ergocalciferol, (DRISDOL) 50000 units CAPS capsule Take 50,000 Units by mouth every 30 (thirty) days.     [provider]    Review of Systems    She denies chest pain, palpitations, dyspnea, pnd, orthopnea, n, v, dizziness, syncope, edema, weight gain, or early satiety.  As noted above, she had hematuria for nearly a week which has subsided.  All other systems reviewed and are otherwise negative except as noted above.  Physical Exam    VS:  BP (!) 120/54 (BP Location: Left Arm, Patient Position: Sitting, Cuff Size: Normal)   Pulse 70   Ht 5\' 3"  (1.6 m)   Wt 129 lb 8 oz (58.7 kg)   BMI 22.94 kg/m  , BMI Body mass index is 22.94 kg/m. GEN: Well nourished, well developed, in no acute distress.  HEENT: normal.  Neck: Supple,  no JVD, carotid bruits, or masses. Cardiac: RRR, no murmurs, rubs, or gallops. No clubbing, cyanosis, edema.  Radials/DP/PT 2+ and equal bilaterally.  Respiratory:  Respirations regular and unlabored, clear to auscultation bilaterally. GI: Soft, nontender, nondistended, BS + x 4. MS: no deformity or atrophy. Skin: warm and dry, no rash. Neuro:  Strength and sensation are intact. Psych: Normal affect.  Accessory Clinical Findings    ECG -regular sinus rhythm, 70, no acute ST or T changes.  Assessment & Plan    1.  Hematuria/urinary tract infection: Patient developed hematuria just over a week ago and was found to have a urinary tract infection.  She is currently being treated with both Septra and Cipro by primary care.  Aspirin and Plavix were held beginning early last week and then aspirin was resumed this past Saturday.  CT in the emergency department showed slight right hydronephrosis and hydroureter.  Hematuria resolved this past Sunday-2 days ago.  She saw urology this morning with plan for follow-up CT and possible cystoscopy/ureteroscopy in the coming weeks.  In the setting of prior VF arrest with known severe coronary disease and RCA stenting in  November, we would like for her to remain on aspirin.  She may continue to hold the Plavix during urologic workup with a plan to resume once cleared from urology standpoint.  She has been doing exceptionally well from a cardiac standpoint and walking regularly without symptoms or limitations.  Should she require cystoscopy or additional instrumentation/surgery, she would not require additional ischemic evaluation at this time.  She should remain on diltiazem and statin therapy throughout peri-procedural periods.  2.  Coronary artery disease: Status post VF arrest in the setting of anaphylaxis and Rocephin treatment in September 2018 with subsequent finding of severe left circumflex and RCA disease.  She is status post RCA stenting in November.  Circumflex has been medically managed.  She has been doing well without chest pain or dyspnea and has been exercising regularly.  She remains on aspirin  and statin therapy.  She is not on a beta-blocker and instead has been taking diltiazem in the setting of paroxysmal atrial fibrillation.  3.  Paroxysmal atrial fibrillation: This was only ever noted during hospitalization either during catheterization or during albuterol inhalations.  She has not any palpitations at home and is well controlled on diltiazem therapy.  She is not on oral anticoagulation in the setting of dual antiplatelet therapy previously.  Certainly in the setting of hematuria, she is not a candidate for oral anticoagulation at this time.  4.  Essential hypertension: Stable on diltiazem.  5.  Hyperlipidemia: LDL was 36 in September 2018.  Continue statin therapy.  6.  History of pericardial effusion: This was minimal on follow-up echo in December.  As above, she has been active without chest pain or dyspnea.  No rub on exam.  7.  Type 1 diabetes mellitus: Uses insulin pump.  Followed by primary care.  8.  Disposition: Patient will follow up with urology on May 7 following a CT.  She currently  has an appointment scheduled Dr. Saunders Revel on May 8.  I will leave this in place in case she requires any additional cardiac clearance or develops cardiac symptoms.  Murray Hodgkins, NP 05/16/2017, 5:08 PM

## 2017-05-16 NOTE — Progress Notes (Signed)
05/16/2017 4:56 PM   Lisa [name] 1950/08/24 [id number]  Referring provider: Lenard Simmer, MD [address] [place], [place] [address]  Chief Complaint  Patient presents with  . Follow-up    ED visit    HPI: 67 year old female with personal history of nephrolithiasis who presented over the weekend to the emergency room with gross hematuria.  She underwent CT abdomen pelvis with contrast which revealed new evidence of right-sided hydronephrosis without any obvious obstructing stone other than high density material in the collecting system and proximal ureter thought to be possibly related to clot.  She reports that she was in the emergency room 1 week earlier with gross hematuria as well.  At that time, she was treated for presumed cystitis of the urine culture was consistent with mixed flora.  She denies any flank pain or any other symptoms.  She does have a personal history of nephrolithiasis requiring surgical intervention 30 years ago.  She also has a personal history of nephrolithiasis in 10/2016 presented with an obstructing stone/UTI and had anaphylactic shock in the emergency room requiring cardiac resuscitation and a prolonged recovery.  She now has a drug-eluting stent placed.  Since her emergency room visit, she is resumed aspirin but continues to hold off on Plavix.  She sees Dr. Saunders Revel later today.  She does have a personal history of smoking and smoked variable amounts for many years.  She has since quit.   PMH: Past Medical History:  Diagnosis Date  . CAD (coronary artery disease)    a. 1999 PTCA @ [facility]; b. NSTEMI/Cath 10/11/2016: LM 20%, oLAD 40%, p-mLAD 40%, dLAD 40%, p-mLCx 95%, pRCA 30%, 95/77m, EF 55-65%; c. 10/2016 CT Surgery->rec PCI due to poor lung fxn. d. 12/02/16 DES to mid RCA.  . Groin hematoma    a. 10/2016 Spont groin hematoma w/ anemia req 1u prbcs.  . High cholesterol   . History of kidney stones   . Mucus plugging of bronchi    a. 10/2016 RLL  Bronchus obstruction->bronchus vs mass;  b. 10/2016 CT Chest: resolution of RLL mucous plug.  . NSTEMI (non-ST elevated myocardial infarction) ([place]) 10/11/2016  . PAF (paroxysmal atrial fibrillation) ([facility])    a. 12/2016 Noted during diag cath--CHA2DS2VASc = 3. No OAC in setting of need for DAPT and h/o spont Thigh Hematoma in 10/2016.  Marland Kitchen Pericardial effusion    a. 10/2016 Echo: EF 60-65%, no rwma, Gr2 DD, small to mod pericardial eff. No tamponade; b. 12/2016 Echo: EF 60-65%, no rwma, Gr1 DD, 1.2 to 1.4 cm mod to large circumferential pericardial effusion w/o tamponade; c. 12/2016 Echo: EF 55-60%, no rwma, triv effusion.  . Pneumonia 02/2016   "couple times before 02/2016 too" (12/02/2016)  . PVD (peripheral vascular disease) ([place])    a. Abdominal aortogram 10/11/16: Infrarenal abdominal aorta heavily calcified & ectatic with approximately 40-50% stenosis at the bifurcation. Mild diffuse disease w/ heavy calcification noted in the common & external iliac arteries bilaterally  . Type I diabetes mellitus ([facility])    a. On insulin pump.(12/02/2016)  . Ventricular fibrillation ([place])    a. 10/11/2016: Felt to be secondary to demand ischemia in the setting of underlying CAD and anaphylactic reaction from IV Rocephin; b. 10/2016 Echo: EF 55-60%, ? mild inf HK; c. 10/2016 Seen by EP: No ICD indication.    Surgical History: Past Surgical History:  Procedure Laterality Date  . ABDOMINAL AORTOGRAM N/A 10/11/2016   Procedure: ABDOMINAL AORTOGRAM;  Surgeon: Nelva Bush, MD;  Location:  [facility];  Service: Cardiovascular;  Laterality: N/A;  . CARDIAC CATHETERIZATION  10/2016  . CERVICAL BIOPSY  W/ LOOP ELECTRODE EXCISION  03/05/2012  . COLONOSCOPY    . COLPOSCOPY  01/17/2011  . CORONARY ANGIOPLASTY WITH STENT PLACEMENT  1999; 12/02/2016   "1 stent  + 1 stent"  . CORONARY ATHERECTOMY N/A 12/02/2016   Procedure: CORONARY ATHERECTOMY - CSI;  Surgeon: Nelva Bush, MD;  Location: [facility];   Service: Cardiovascular;  Laterality: N/A;  . CORONARY STENT INTERVENTION N/A 12/02/2016   Procedure: CORONARY STENT INTERVENTION;  Surgeon: Nelva Bush, MD;  Location: [facility];  Service: Cardiovascular;  Laterality: N/A;  . LEFT HEART CATH AND CORONARY ANGIOGRAPHY N/A 10/11/2016   Procedure: LEFT HEART CATH AND CORONARY ANGIOGRAPHY;  Surgeon: Nelva Bush, MD;  Location: [facility];  Service: Cardiovascular;  Laterality: N/A;  . NASAL SINUS SURGERY  ~ 2009  . TEMPORARY PACEMAKER N/A 12/02/2016   Procedure: TEMPORARY PACEMAKER;  Surgeon: Nelva Bush, MD;  Location: [facility];  Service: Cardiovascular;  Laterality: N/A;  . TONSILLECTOMY      Home Medications:  Allergies as of 05/16/2017      Reactions   Ceftriaxone    Anaphylaxis, cardiac arrest   Penicillins Anaphylaxis   Rocephin [ceftriaxone Sodium In Dextrose] Anaphylaxis   Albuterol Other (See Comments)   Tachycardia-->Afib. Tolerates Xopenex      Medication List        Accurate as of 05/16/17  4:56 PM. Always use your most recent med list.          ALPRAZolam 0.25 MG tablet Commonly known as:  XANAX Take 0.25 mg by mouth 2 (two) times daily as needed (for anxiety.).   aspirin EC 81 MG tablet Take 81 mg by mouth daily.   atorvastatin 40 MG tablet Commonly known as:  LIPITOR Take 40 mg by mouth at bedtime.   ciprofloxacin 250 MG tablet Commonly known as:  CIPRO Take 250 mg by mouth 2 (two) times daily.   clopidogrel 75 MG tablet Commonly known as:  PLAVIX Take 1 tablet (75 mg total) by mouth daily.   diltiazem 240 MG 24 hr capsule Commonly known as:  CARDIZEM CD Take 1 capsule (240 mg total) by mouth daily.   diltiazem 30 MG tablet Commonly known as:  CARDIZEM One tablet every 6 hours as needed for heart rate >100   insulin pump Soln Inject into the skin. HUMALOG 100 UNITS/ML WITH INSULIN PUMP   nitroGLYCERIN 0.4 MG SL tablet Commonly known as:  NITROSTAT Place 1  tablet (0.4 mg total) under the tongue every 5 (five) minutes x 3 doses as needed for chest pain.   terconazole 0.8 % vaginal cream Commonly known as:  TERAZOL 3 Place 1 applicator vaginally at bedtime.   Vitamin D (Ergocalciferol) 50000 units Caps capsule Commonly known as:  DRISDOL Take 50,000 Units by mouth every 30 (thirty) days.       Allergies:  Allergies  Allergen Reactions  . Ceftriaxone     Anaphylaxis, cardiac arrest  . Penicillins Anaphylaxis  . Rocephin [Ceftriaxone Sodium In Dextrose] Anaphylaxis  . Albuterol Other (See Comments)    Tachycardia-->Afib. Tolerates Xopenex    Family History: Family History  Problem Relation Age of Onset  . Hypertension Mother   . Aortic aneurysm Father   . Hypertension Brother   . Lung disease Neg Hx   . Rheumatologic disease Neg Hx     Social  History:  reports that she quit smoking about 7 months ago. Her smoking use included cigarettes. She started smoking about 48 years ago. She has a 11.75 pack-year smoking history. She has never used smokeless tobacco. She reports that she does not drink alcohol or use drugs.  ROS: UROLOGY Frequent Urination?: No Hard to postpone urination?: No Burning/pain with urination?: No Get up at night to urinate?: Yes Leakage of urine?: No Urine stream starts and stops?: No Trouble starting stream?: No Do you have to strain to urinate?: No Blood in urine?: Yes Urinary tract infection?: No Sexually transmitted disease?: No Injury to kidneys or bladder?: No Painful intercourse?: No Weak stream?: No Currently pregnant?: No Vaginal bleeding?: No Last menstrual period?: n  Gastrointestinal Nausea?: No Vomiting?: No Indigestion/heartburn?: No Diarrhea?: No Constipation?: No  Constitutional Fever: No Night sweats?: No Weight loss?: No Fatigue?: No  Skin Skin rash/lesions?: No Itching?: No  Eyes Blurred vision?: No Double vision?: No  Ears/Nose/Throat Sore throat?: No Sinus  problems?: No  Hematologic/Lymphatic Swollen glands?: No Easy bruising?: No  Cardiovascular Leg swelling?: No Chest pain?: No  Respiratory Cough?: No Shortness of breath?: No  Endocrine Excessive thirst?: No  Musculoskeletal Back pain?: No Joint pain?: No  Neurological Headaches?: No Dizziness?: No  Psychologic Depression?: No Anxiety?: No  Physical Exam: BP 137/66   Pulse (!) 109   Ht 5\' 3"  (1.6 m)   Wt 125 lb (56.7 kg)   BMI 22.14 kg/m   Constitutional:  Alert and oriented, No acute distress.  Accompanied by daughter. HEENT: [place] AT, moist mucus membranes.  Trachea midline, no masses. Cardiovascular: No clubbing, cyanosis, or edema. Respiratory: Normal respiratory effort, no increased work of breathing. GI: Abdomen is soft, nontender, nondistended, no abdominal masses GU: No CVA tenderness. Skin: No rashes, bruises or suspicious lesions. Neurologic: Grossly intact, no focal deficits, moving all 4 extremities. Psychiatric: Normal mood and affect.  Laboratory Data: Lab Results  Component Value Date   WBC 6.9 05/13/2017   HGB 12.1 05/13/2017   HCT 36.8 05/13/2017   MCV 82.2 05/13/2017   PLT 265 05/13/2017    Lab Results  Component Value Date   CREATININE 0.85 05/13/2017    Lab Results  Component Value Date   HGBA1C 7.0 (H) 10/15/2016    Urinalysis Component     Latest Ref Rng & Units 05/13/2017  Color, Urine     YELLOW RED (A)  Appearance     CLEAR CLOUDY (A)  Specific Gravity, Urine     1.005 - 1.030 1.014  pH     5.0 - 8.0 TEST NOT REPORTED DUE TO COLOR INTERFERENCE OF URINE PIGMENT  Glucose     NEGATIVE mg/dL TEST NOT REPORTED DUE TO COLOR INTERFERENCE OF URINE PIGMENT (A)  Hgb urine dipstick     NEGATIVE TEST NOT REPORTED DUE TO COLOR INTERFERENCE OF URINE PIGMENT (A)  Bilirubin Urine     NEGATIVE TEST NOT REPORTED DUE TO COLOR INTERFERENCE OF URINE PIGMENT (A)  Ketones, ur     NEGATIVE mg/dL TEST NOT REPORTED DUE TO COLOR  INTERFERENCE OF URINE PIGMENT (A)  Protein     NEGATIVE mg/dL TEST NOT REPORTED DUE TO COLOR INTERFERENCE OF URINE PIGMENT (A)  Nitrite     NEGATIVE TEST NOT REPORTED DUE TO COLOR INTERFERENCE OF URINE PIGMENT (A)  Leukocytes, UA     NEGATIVE TEST NOT REPORTED DUE TO COLOR INTERFERENCE OF URINE PIGMENT (A)  RBC / HPF     0 - 5  RBC/hpf TOO NUMEROUS TO COUNT  WBC, UA     0 - 5 WBC/hpf FEW  Bacteria, UA     NONE SEEN NONE SEEN  Squamous Epithelial / LPF     NONE SEEN NONE SEEN    Component     Latest Ref Rng & Units 05/07/2017          Specimen Description      URINE, RANDOM . Marland Kitchen Marland Kitchen  Special Requests      NONE . Marland Kitchen .  Culture      MULTIPLE SPECIES PRESENT, SUGGEST RECOLLECTION (A)  Report Status      05/09/2017 FINAL    Pertinent Imaging: CLINICAL DATA:  Hematuria.  EXAM: CT ABDOMEN AND PELVIS WITH CONTRAST  TECHNIQUE: Multidetector CT imaging of the abdomen and pelvis was performed using the standard protocol following bolus administration of intravenous contrast.  CONTRAST:  151mL ISOVUE-300 IOPAMIDOL (ISOVUE-300) INJECTION 61%  COMPARISON:  CT abdomen pelvis dated October 11, 2016.  FINDINGS: Lower chest: No acute abnormality.  Hepatobiliary: No focal liver abnormality is seen. No gallstones, gallbladder wall thickening, or biliary dilatation.  Pancreas: Unchanged truncated appearance of the tail. No ductal dilatation or surrounding inflammatory changes.  Spleen: Normal in size without focal abnormality.  Adrenals/Urinary Tract: The adrenal glands are unremarkable. Punctate nonobstructive left renal calculus. New mild right hydronephrosis and proximal hydroureter without evidence of obstructing calculus or focal lesion. Delayed excretion of contrast from the right kidney. No focal renal mass. The bladder is unremarkable for the degree of distention.  Stomach/Bowel: Stomach is within normal limits. Appendix appears normal. No evidence of bowel  wall thickening, distention, or inflammatory changes.  Vascular/Lymphatic: Extensive aortic atherosclerosis with slight interval increase in size of infrarenal abdominal aorta, now measuring 3.0 cm, previously 2.8 cm. 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 No lymphadenopathy.  Reproductive: Calcified uterine fibroid again noted. No adnexal mass.  Other: Unchanged small fat containing umbilical hernia. No free fluid or pneumoperitoneum.  Musculoskeletal: No acute or significant osseous findings. Stable mild chronic compression fractures of L4 and L5.  IMPRESSION: 1. New mild right hydronephrosis and proximal hydroureter without definite obstructing calculus or focal lesion. Delayed excretion of contrast from the right kidney. Consider ureteroscopy for further evaluation. 2. Punctate nonobstructive left nephrolithiasis. 3. Interval increase in size of the infrarenal abdominal aorta, now measuring 3.0 cm, consistent with aneurysm. Recommend followup by ultrasound in 3 years. This recommendation follows ACR consensus guidelines: White Paper of the ACR Incidental Findings Committee II on Vascular Findings. J Am Coll Radiol 2013; 83:382-505 4. Aortic atherosclerosis (ICD10-I70.0). Short segment non flow limiting dissection in the distal infrarenal aorta, unchanged.   Electronically Signed   By: Titus Dubin M.D.   On: 05/13/2017 11:36  CT scan personally reviewed today with the patient and her daughter.  Assessment & Plan:    1. Gross hematuria We discussed the differential diagnosis for gross hematuria At this point time, we will plan to proceed as per #2 We will schedule cystoscopy in the office if her CT urogram is normal, otherwise will perform at the time of surgery as per #2 - Urinalysis, Complete - CT HEMATURIA WORKUP; Future  2. Hydronephrosis of right kidney Right hydronephrosis of unclear etiology Discussed  differential diagnosis which includes papillary necrosis with sloughing of tissue, blood clots, intraluminal lesions, amongst others. Given her extensive cardiac history and multiple medical comorbidities, she is very anxious to see back to the operating room.  We discussed options including diagnostic ureteroscopy versus repeat follow-up imaging in 2-3 weeks to see if her hydronephrosis has completely resolved.  She is more interested in repeating imaging which seems reasonable.  We will plan for CT urogram in about 2 weeks.  If her hydronephrosis resolves and there is no filling defects, will plan for office cystoscopy as per #1  If her hydronephrosis fails to resolve, we will plan to proceed to the operating room for diagnostic ureteroscopy.  She may continue aspirin and Plavix at this time.  She is seeing Dr. Saunders Revel later today.  If she ultimately does proceed with surgery, will likely hold her Plavix  3. History of kidney stones No calculi on CT scan Interval passage of left distal ureteral calculus   Return in about 3 weeks (around 06/06/2017) for cystoscopy (if CT urogram negative), CT in 2 weeks.  Hollice Espy, MD  [facility] [address] [place], [place] [address] [phone]  I spent 25 min with this patient of which greater than 50% was spent in counseling and coordination of care with the patient.

## 2017-05-17 ENCOUNTER — Encounter: Payer: Self-pay | Admitting: Urology

## 2017-05-23 DIAGNOSIS — Z9641 Presence of insulin pump (external) (internal): Secondary | ICD-10-CM | POA: Diagnosis not present

## 2017-05-23 DIAGNOSIS — I2510 Atherosclerotic heart disease of native coronary artery without angina pectoris: Secondary | ICD-10-CM | POA: Diagnosis not present

## 2017-05-23 DIAGNOSIS — N3091 Cystitis, unspecified with hematuria: Secondary | ICD-10-CM | POA: Diagnosis not present

## 2017-05-23 DIAGNOSIS — M810 Age-related osteoporosis without current pathological fracture: Secondary | ICD-10-CM | POA: Diagnosis not present

## 2017-05-23 DIAGNOSIS — I10 Essential (primary) hypertension: Secondary | ICD-10-CM | POA: Diagnosis not present

## 2017-05-23 DIAGNOSIS — I469 Cardiac arrest, cause unspecified: Secondary | ICD-10-CM | POA: Diagnosis not present

## 2017-05-23 DIAGNOSIS — E7800 Pure hypercholesterolemia, unspecified: Secondary | ICD-10-CM | POA: Diagnosis not present

## 2017-05-23 DIAGNOSIS — I340 Nonrheumatic mitral (valve) insufficiency: Secondary | ICD-10-CM | POA: Diagnosis not present

## 2017-05-23 DIAGNOSIS — E1165 Type 2 diabetes mellitus with hyperglycemia: Secondary | ICD-10-CM | POA: Diagnosis not present

## 2017-05-23 DIAGNOSIS — Z4681 Encounter for fitting and adjustment of insulin pump: Secondary | ICD-10-CM | POA: Diagnosis not present

## 2017-05-23 DIAGNOSIS — E118 Type 2 diabetes mellitus with unspecified complications: Secondary | ICD-10-CM | POA: Diagnosis not present

## 2017-05-25 ENCOUNTER — Ambulatory Visit: Payer: Medicare HMO | Admitting: General Surgery

## 2017-05-31 ENCOUNTER — Ambulatory Visit
Admission: RE | Admit: 2017-05-31 | Discharge: 2017-05-31 | Disposition: A | Discharge status: Home / Self Care | Payer: Medicare HMO | Source: Ambulatory Visit | Attending: Urology | Admitting: Urology

## 2017-05-31 DIAGNOSIS — R310 Gross hematuria: Secondary | ICD-10-CM

## 2017-05-31 DIAGNOSIS — N200 Calculus of kidney: Secondary | ICD-10-CM | POA: Insufficient documentation

## 2017-05-31 DIAGNOSIS — I700 Atherosclerosis of aorta: Secondary | ICD-10-CM | POA: Insufficient documentation

## 2017-05-31 DIAGNOSIS — N132 Hydronephrosis with renal and ureteral calculous obstruction: Secondary | ICD-10-CM | POA: Diagnosis not present

## 2017-05-31 MED ORDER — IOPAMIDOL (ISOVUE-300) INJECTION 61%
100.0000 mL | Freq: Once | INTRAVENOUS | Status: AC | PRN
  Administered 2017-05-31: 100 mL via INTRAVENOUS

## 2017-06-06 ENCOUNTER — Other Ambulatory Visit: Payer: Self-pay

## 2017-06-06 ENCOUNTER — Ambulatory Visit (INDEPENDENT_AMBULATORY_CARE_PROVIDER_SITE_OTHER): Payer: Medicare HMO | Admitting: Urology

## 2017-06-06 DIAGNOSIS — I10 Essential (primary) hypertension: Secondary | ICD-10-CM | POA: Diagnosis not present

## 2017-06-06 DIAGNOSIS — E118 Type 2 diabetes mellitus with unspecified complications: Secondary | ICD-10-CM | POA: Diagnosis not present

## 2017-06-06 DIAGNOSIS — N3001 Acute cystitis with hematuria: Secondary | ICD-10-CM | POA: Diagnosis not present

## 2017-06-06 DIAGNOSIS — R3129 Other microscopic hematuria: Secondary | ICD-10-CM | POA: Diagnosis not present

## 2017-06-06 DIAGNOSIS — I2510 Atherosclerotic heart disease of native coronary artery without angina pectoris: Secondary | ICD-10-CM | POA: Diagnosis not present

## 2017-06-06 DIAGNOSIS — E1165 Type 2 diabetes mellitus with hyperglycemia: Secondary | ICD-10-CM | POA: Diagnosis not present

## 2017-06-06 DIAGNOSIS — I469 Cardiac arrest, cause unspecified: Secondary | ICD-10-CM | POA: Diagnosis not present

## 2017-06-06 DIAGNOSIS — R69 Illness, unspecified: Secondary | ICD-10-CM | POA: Diagnosis not present

## 2017-06-06 DIAGNOSIS — Z4681 Encounter for fitting and adjustment of insulin pump: Secondary | ICD-10-CM | POA: Diagnosis not present

## 2017-06-06 DIAGNOSIS — E559 Vitamin D deficiency, unspecified: Secondary | ICD-10-CM | POA: Diagnosis not present

## 2017-06-06 DIAGNOSIS — E7800 Pure hypercholesterolemia, unspecified: Secondary | ICD-10-CM | POA: Diagnosis not present

## 2017-06-06 LAB — MICROSCOPIC EXAMINATION
Bacteria, UA: NONE SEEN
WBC, UA: NONE SEEN /hpf (ref 0–5)

## 2017-06-06 LAB — URINALYSIS, COMPLETE
BILIRUBIN UA: NEGATIVE
GLUCOSE, UA: NEGATIVE
KETONES UA: NEGATIVE
Leukocytes, UA: NEGATIVE
NITRITE UA: NEGATIVE
Protein, UA: NEGATIVE
SPEC GRAV UA: 1.01 (ref 1.005–1.030)
Urobilinogen, Ur: 0.2 mg/dL (ref 0.2–1.0)
pH, UA: 7 (ref 5.0–7.5)

## 2017-06-06 NOTE — Progress Notes (Signed)
Due to an emergency in the operating room today, cystoscopy today was rescheduled.  Hollice Espy, MD

## 2017-06-07 ENCOUNTER — Ambulatory Visit: Payer: Self-pay | Admitting: Internal Medicine

## 2017-06-07 ENCOUNTER — Encounter: Payer: Self-pay | Admitting: Urology

## 2017-06-07 ENCOUNTER — Ambulatory Visit: Payer: Medicare HMO | Admitting: Urology

## 2017-06-07 VITALS — BP 116/67 | HR 78 | Ht 63.0 in | Wt 125.0 lb

## 2017-06-07 DIAGNOSIS — R310 Gross hematuria: Secondary | ICD-10-CM

## 2017-06-07 DIAGNOSIS — N952 Postmenopausal atrophic vaginitis: Secondary | ICD-10-CM

## 2017-06-07 DIAGNOSIS — N1330 Unspecified hydronephrosis: Secondary | ICD-10-CM

## 2017-06-07 MED ORDER — CIPROFLOXACIN HCL 500 MG PO TABS
500.0000 mg | ORAL_TABLET | Freq: Once | ORAL | Status: AC
Start: 2017-06-07 — End: 2017-06-07
  Administered 2017-06-07: 500 mg via ORAL

## 2017-06-07 MED ORDER — LIDOCAINE HCL URETHRAL/MUCOSAL 2 % EX GEL
1.0000 "application " | Freq: Once | CUTANEOUS | Status: AC
  Administered 2017-06-07: 1 via URETHRAL

## 2017-06-07 NOTE — Progress Notes (Signed)
   06/27/17  CC:  Chief Complaint  Patient presents with  . Cysto    HPI: 67 year old female with a personal history of nephrolithiasis and recent episode of gross hematuria with associated right-sided hydronephrosis who returns today with repeat her gram and office cystoscopy to complete her work-up.  She has a personal history of cardiac arrest secondary to anaphylactic shock with a prolonged recovery on anticoagulation.  More recently, she had an episode of gross hematuria and underwent CT abdomen pelvis with new evidence of right-sided hydronephrosis which is not previously appreciated without a clear obstructing source other than high density material representing either tumor versus clot.  She elected conservative management with repeat follow-up scan.  She underwent repeat CT urogram on 05/31/2017 now with resolution of her hydronephrosis and no clear filling defect.  2 incidental tiny left nonobstructing stones are appreciated.  No further gross hematuria.  She does have occasional extra burning following urination.  Blood pressure 116/67, pulse 78, height 5\' 3"  (1.6 m), weight 125 lb (56.7 kg). NED. A&Ox3.   No respiratory distress   Abd soft, NT, ND Normal external genitalia with patent urethral meatus  Cystoscopy Procedure Note  Patient identification was confirmed, informed consent was obtained, and patient was prepped using Betadine solution.  Lidocaine jelly was administered per urethral meatus.    Preoperative abx where received prior to procedure.    Procedure: - Flexible cystoscope introduced, without any difficulty.   - Thorough search of the bladder revealed:    normal urethral meatus    normal urothelium    no stones    no ulcers     no tumors    no urethral polyps    no trabeculation  - Ureteral orifices were normal in position and appearance.  Post-Procedure: - Patient tolerated the procedure well  Assessment/ Plan:  1. Gross hematuria Suspect  episode of papillary necrosis with clot in the setting of antibiotic therapy as cause of hydronephrosis and bleeding Bleeding is completely resolved, okay to resume antiplatelet therapy Complete resolution of hydronephrosis without any further filling defects appreciated is reassuring for no underlying malignancy or lesions Cystoscopy today unremarkable - ciprofloxacin (CIPRO) tablet 500 mg - lidocaine (XYLOCAINE) 2 % jelly 1 application  2. Hydronephrosis of right kidney Resolved as above  3. Atrophic vaginitis Symptomatic with post voiding burning Sample of premarin cream M-W-Fr, discussed using a pea sized amount of cream per urethra  Discussed risk and benefits   Return if symptoms worsen or fail to improve.    Hollice Espy, MD

## 2017-07-24 DIAGNOSIS — E109 Type 1 diabetes mellitus without complications: Secondary | ICD-10-CM | POA: Diagnosis not present

## 2017-08-15 DIAGNOSIS — E559 Vitamin D deficiency, unspecified: Secondary | ICD-10-CM | POA: Diagnosis not present

## 2017-08-15 DIAGNOSIS — M810 Age-related osteoporosis without current pathological fracture: Secondary | ICD-10-CM | POA: Diagnosis not present

## 2017-08-15 DIAGNOSIS — E1165 Type 2 diabetes mellitus with hyperglycemia: Secondary | ICD-10-CM | POA: Diagnosis not present

## 2017-08-15 DIAGNOSIS — I2510 Atherosclerotic heart disease of native coronary artery without angina pectoris: Secondary | ICD-10-CM | POA: Diagnosis not present

## 2017-08-19 DIAGNOSIS — R69 Illness, unspecified: Secondary | ICD-10-CM | POA: Diagnosis not present

## 2017-08-29 DIAGNOSIS — E7800 Pure hypercholesterolemia, unspecified: Secondary | ICD-10-CM | POA: Diagnosis not present

## 2017-08-29 DIAGNOSIS — I10 Essential (primary) hypertension: Secondary | ICD-10-CM | POA: Diagnosis not present

## 2017-08-29 DIAGNOSIS — E118 Type 2 diabetes mellitus with unspecified complications: Secondary | ICD-10-CM | POA: Diagnosis not present

## 2017-08-29 DIAGNOSIS — M810 Age-related osteoporosis without current pathological fracture: Secondary | ICD-10-CM | POA: Diagnosis not present

## 2017-08-29 DIAGNOSIS — I2510 Atherosclerotic heart disease of native coronary artery without angina pectoris: Secondary | ICD-10-CM | POA: Diagnosis not present

## 2017-08-29 DIAGNOSIS — Z4681 Encounter for fitting and adjustment of insulin pump: Secondary | ICD-10-CM | POA: Diagnosis not present

## 2017-08-29 DIAGNOSIS — K229 Disease of esophagus, unspecified: Secondary | ICD-10-CM | POA: Diagnosis not present

## 2017-08-29 DIAGNOSIS — Z9641 Presence of insulin pump (external) (internal): Secondary | ICD-10-CM | POA: Diagnosis not present

## 2017-08-29 DIAGNOSIS — E559 Vitamin D deficiency, unspecified: Secondary | ICD-10-CM | POA: Diagnosis not present

## 2017-08-29 DIAGNOSIS — I340 Nonrheumatic mitral (valve) insufficiency: Secondary | ICD-10-CM | POA: Diagnosis not present

## 2017-08-30 DIAGNOSIS — E7800 Pure hypercholesterolemia, unspecified: Secondary | ICD-10-CM | POA: Diagnosis not present

## 2017-08-30 DIAGNOSIS — R69 Illness, unspecified: Secondary | ICD-10-CM | POA: Diagnosis not present

## 2017-08-30 DIAGNOSIS — F418 Other specified anxiety disorders: Secondary | ICD-10-CM | POA: Diagnosis not present

## 2017-08-30 DIAGNOSIS — J45998 Other asthma: Secondary | ICD-10-CM | POA: Diagnosis not present

## 2017-08-30 DIAGNOSIS — I10 Essential (primary) hypertension: Secondary | ICD-10-CM | POA: Diagnosis not present

## 2017-08-30 DIAGNOSIS — M810 Age-related osteoporosis without current pathological fracture: Secondary | ICD-10-CM | POA: Diagnosis not present

## 2017-08-30 DIAGNOSIS — I340 Nonrheumatic mitral (valve) insufficiency: Secondary | ICD-10-CM | POA: Diagnosis not present

## 2017-08-30 DIAGNOSIS — E1165 Type 2 diabetes mellitus with hyperglycemia: Secondary | ICD-10-CM | POA: Diagnosis not present

## 2017-08-30 DIAGNOSIS — I469 Cardiac arrest, cause unspecified: Secondary | ICD-10-CM | POA: Diagnosis not present

## 2017-08-30 DIAGNOSIS — Z4681 Encounter for fitting and adjustment of insulin pump: Secondary | ICD-10-CM | POA: Diagnosis not present

## 2017-08-30 DIAGNOSIS — I2510 Atherosclerotic heart disease of native coronary artery without angina pectoris: Secondary | ICD-10-CM | POA: Diagnosis not present

## 2017-09-18 DIAGNOSIS — E559 Vitamin D deficiency, unspecified: Secondary | ICD-10-CM | POA: Diagnosis not present

## 2017-09-18 DIAGNOSIS — I10 Essential (primary) hypertension: Secondary | ICD-10-CM | POA: Diagnosis not present

## 2017-09-18 DIAGNOSIS — I340 Nonrheumatic mitral (valve) insufficiency: Secondary | ICD-10-CM | POA: Diagnosis not present

## 2017-09-18 DIAGNOSIS — I469 Cardiac arrest, cause unspecified: Secondary | ICD-10-CM | POA: Diagnosis not present

## 2017-09-18 DIAGNOSIS — I2510 Atherosclerotic heart disease of native coronary artery without angina pectoris: Secondary | ICD-10-CM | POA: Diagnosis not present

## 2017-09-18 DIAGNOSIS — E1165 Type 2 diabetes mellitus with hyperglycemia: Secondary | ICD-10-CM | POA: Diagnosis not present

## 2017-09-18 DIAGNOSIS — M810 Age-related osteoporosis without current pathological fracture: Secondary | ICD-10-CM | POA: Diagnosis not present

## 2017-09-18 DIAGNOSIS — N3001 Acute cystitis with hematuria: Secondary | ICD-10-CM | POA: Diagnosis not present

## 2017-09-18 DIAGNOSIS — N1330 Unspecified hydronephrosis: Secondary | ICD-10-CM | POA: Diagnosis not present

## 2017-09-18 DIAGNOSIS — R69 Illness, unspecified: Secondary | ICD-10-CM | POA: Diagnosis not present

## 2017-09-20 ENCOUNTER — Encounter: Payer: Self-pay | Admitting: Internal Medicine

## 2017-09-20 ENCOUNTER — Ambulatory Visit: Payer: Medicare HMO | Admitting: Internal Medicine

## 2017-09-20 VITALS — BP 110/60 | HR 71 | Ht 62.0 in | Wt 136.2 lb

## 2017-09-20 DIAGNOSIS — I3139 Other pericardial effusion (noninflammatory): Secondary | ICD-10-CM

## 2017-09-20 DIAGNOSIS — I313 Pericardial effusion (noninflammatory): Secondary | ICD-10-CM | POA: Diagnosis not present

## 2017-09-20 DIAGNOSIS — I480 Paroxysmal atrial fibrillation: Secondary | ICD-10-CM | POA: Diagnosis not present

## 2017-09-20 DIAGNOSIS — I2510 Atherosclerotic heart disease of native coronary artery without angina pectoris: Secondary | ICD-10-CM | POA: Diagnosis not present

## 2017-09-20 DIAGNOSIS — E785 Hyperlipidemia, unspecified: Secondary | ICD-10-CM | POA: Diagnosis not present

## 2017-09-20 DIAGNOSIS — E1059 Type 1 diabetes mellitus with other circulatory complications: Secondary | ICD-10-CM | POA: Diagnosis not present

## 2017-09-20 NOTE — Patient Instructions (Signed)
Medication Instructions:  Your physician has recommended you make the following change in your medication:  REMAIN off Plavix.   Labwork: none  Testing/Procedures: none  Follow-Up: Your physician recommends that you schedule a follow-up appointment in: [place].  If you need a refill on your cardiac medications before your next appointment, please call your pharmacy.

## 2017-09-20 NOTE — Progress Notes (Signed)
Follow-up Outpatient Visit Date: 09/20/2017  Primary Care Provider: Lenard Simmer, MD [place] [id number]  Chief Complaint: Follow-up CAD.  HPI:  Lisa [name] is a 67 y.o. year-old female with history of coronary artery disease status post remote coronary intervention at [place] (the late 90s) and cardiac arrest in the setting of anaphylaxis with catheterization demonstrating severe 2 vessel CAD(subsequent atherectomy and PCI to RCA),recurrent pericardial effusion, type1 diabetes mellitus, peripheral vascular disease with AAA, right lower lobe pneumonia with mucous plugging, and spontaneous right thigh hematoma in the setting of anticoagulation.  She was last seen in our office in April, at which time she was doing well other than having had an episode of hematuria.  She was worked up by urology without significant abnormalities other than mild right hydronephrosis.  Clopidogrel and aspirin were temporarily held, though she subsequently restarted both.  However, she developed significant epistaxis short time later and has not been taking clopidogrel for the last couple of months.  She has not had any further significant bleeding.  Lisa [name] denies chest pain as well as shortness of breath, palpitations, lightheadedness, and edema.  She reports that her hemoglobin A1c was actually somewhat low recently and she is now no longer using an insulin pump.  She does not need to use any of her as needed diltiazem for palpitations.  --------------------------------------------------------------------------------------------------  Cardiovascular History & Procedures: Cardiovascular Problems:  Coronary artery disease with VF arrest and transient complete heart block in the setting of anaphylactic shock  Recurrent pericardial effusion  Paroxysmal atrial fibrillation  Risk Factors:  Known coronary artery disease, peripheral vascular disease, and type 1 diabetes  mellitus  Cath/PCI:  PCI (12/02/16): Successful orbital atherectomy and PCI to heavily calcified mid RCA disease with placement of a Xience Alpine 3.0 x 33 mm drug-eluting stent.  LHC (10/11/16): Severe two-vessel coronary artery disease including heavily calcified 95% proximal/mid LCx and sequential 90-99% mid RCA stenoses. Mild to moderate, nonobstructive disease involving the LMCA and LAD. Basal inferior hypokinesis with overall preserved LV contraction. Mildly elevated LVEDP. Peripheral vascular disease with ectasia and calcification of the infrarenal abdominal aorta and iliac arteries.  CV Surgery:  None  EP Procedures and Devices:  None  Non-Invasive Evaluation(s):  Limited TTE (01/19/17): Trivial pericardial effusion, significantly decreased in size from prior study. LVEF 55-60%.  Limited TTE (12/13/16): Normal LV size with LVEF of 65-70%. Normal RV size and function. Moderate to large circumferential pericardial effusion, stable to slightly decreased in size compared with 12/09/16. No evidence of tamponadephysiology.  TTE (10/25/16): Normal LV size with mild to moderate LVH. LVEF 60-65% with grade 2 diastolic dysfunction. RV size and function. Normal PA pressure. Small-to-moderate pericardial effusion.  TTE (10/12/16): Normal LV size. LVEF 55-60%. Normal diastolic function. Normal RV size and function. Normal PA pressure.  Recent CV Pertinent Labs: Lab Results  Component Value Date   CHOL 81 10/13/2016   HDL 34 (L) 10/13/2016   LDLCALC 36 10/13/2016   TRIG 56 10/13/2016   CHOLHDL 2.4 10/13/2016   INR 0.94 11/23/2016   BNP 69.0 10/25/2016   K 4.2 05/13/2017   MG 1.6 (L) 12/08/2016   BUN 17 05/13/2017   CREATININE 0.85 05/13/2017    Past medical and surgical history were reviewed and updated in EPIC.  Current Meds  Medication Sig  . ALPRAZolam (XANAX) 0.25 MG tablet Take 0.25 mg by mouth 2 (two) times daily as needed (for anxiety.).   Marland Kitchen aspirin EC 81 MG tablet  Take 81 mg by mouth daily.  Marland Kitchen atorvastatin (LIPITOR) 40 MG tablet Take 40 mg by mouth at bedtime.   Marland Kitchen diltiazem (CARDIZEM CD) 240 MG 24 hr capsule Take 1 capsule (240 mg total) by mouth daily.  Marland Kitchen diltiazem (CARDIZEM) 30 MG tablet One tablet every 6 hours as needed for heart rate >100  . HUMALOG MIX 75/25 KWIKPEN (75-25) 100 UNIT/ML Kwikpen INJECT 10 UNITS EVERY MORNING AND 15 UNITS EVERY EVENING BEFORE MEALS  . insulin lispro (HUMALOG) 100 UNIT/ML injection Inject into the skin.  Marland Kitchen nitroGLYCERIN (NITROSTAT) 0.4 MG SL tablet Place 1 tablet (0.4 mg total) under the tongue every 5 (five) minutes x 3 doses as needed for chest pain.  Marland Kitchen PROLIA 60 MG/ML SOSY injection every 6 (six) months.   . terconazole (TERAZOL 3) 0.8 % vaginal cream Place 1 applicator vaginally at bedtime.  . Vitamin D, Ergocalciferol, (DRISDOL) 50000 units CAPS capsule Take 50,000 Units by mouth every 30 (thirty) days.     Allergies: Ceftriaxone; Penicillins; Rocephin [ceftriaxone sodium in dextrose]; and Albuterol  Social History   Tobacco Use  . Smoking status: Former Smoker    Packs/day: 0.25    Years: 47.00    Pack years: 11.75    Types: Cigarettes    Start date: 01/25/1969    Last attempt to quit: 10/11/2016    Years since quitting: 0.9  . Smokeless tobacco: Never Used  . Tobacco comment: 12/16/2016 Wants to quit, has not set quit date.  Has been informed about resources available for smoking cessation  Substance Use Topics  . Alcohol use: No    Alcohol/week: 0.0 standard drinks    Frequency: Never  . Drug use: No    Family History  Problem Relation Age of Onset  . Hypertension Mother   . Aortic aneurysm Father   . Hypertension Brother   . Lung disease Neg Hx   . Rheumatologic disease Neg Hx     Review of Systems: A 12-system review of systems was performed and was negative except as noted in the  HPI.  --------------------------------------------------------------------------------------------------  Physical Exam: BP 110/60 (BP Location: Left Arm, Patient Position: Sitting, Cuff Size: Normal)   Pulse 71   Ht 5\' 2"  (1.575 m)   Wt 136 lb 4 oz (61.8 kg)   BMI 24.92 kg/m   General: NAD. HEENT: No conjunctival pallor or scleral icterus. Moist mucous membranes.  OP clear. Neck: Supple without lymphadenopathy, thyromegaly, JVD, or HJR. Lungs: Normal work of breathing. Clear to auscultation bilaterally without wheezes or crackles. Heart: Regular rate and rhythm without murmurs, rubs, or gallops. Non-displaced PMI. Abd: Bowel sounds present. Soft, NT/ND without hepatosplenomegaly Ext: No lower extremity edema. Skin: Warm and dry without rash.  EKG: NSR without significant abnormalities.  Lab Results  Component Value Date   WBC 6.9 05/13/2017   HGB 12.1 05/13/2017   HCT 36.8 05/13/2017   MCV 82.2 05/13/2017   PLT 265 05/13/2017    Lab Results  Component Value Date   NA 131 (L) 05/13/2017   K 4.2 05/13/2017   CL 100 (L) 05/13/2017   CO2 26 05/13/2017   BUN 17 05/13/2017   CREATININE 0.85 05/13/2017   GLUCOSE 94 05/13/2017   ALT 20 05/07/2017    Lab Results  Component Value Date   CHOL 81 10/13/2016   HDL 34 (L) 10/13/2016   LDLCALC 36 10/13/2016   TRIG 56 10/13/2016   CHOLHDL 2.4 10/13/2016    --------------------------------------------------------------------------------------------------  ASSESSMENT AND PLAN: Coronary artery  disease without angina Patient continues to do well.  She did not tolerate extended DAPT due to hematuria and epistaxis.  She is greater than 6 months out from PCI to the RCA.  While I would like to continue long-term DAPT in the setting of her multivessel CAD and initial presentation with cardiac arrest, I worry that the risks of bleeding outweigh the benefits.  We will therefore continue with aspirin alone.  Lisa [name] should continue  her current medications for secondary prevention.  Outside labs by her PCP were notable for LDL at goal.  Paroxysmal atrial fibrillation This occurred in the setting of acute illness when hospitalized last year.  No recurrence or evidence of atrial fibrillation on today's EKG.  We will not initiate anticoagulation, given history of bleeding issues.  Continue diltiazem.  Pericardial effusion No symptoms to suggest recurrent effusion.  Effusion had essentially resolved on most recent echo in December.  No further work-up.  Hyperlipidemia LDL at goal.  Continue atorvastatin 40 mg daily.  Type 1 diabetes mellitus Very good control of hemoglobin A1c.  Further management per Dr. Francoise Schaumann.  Follow-up: Return to clinic in 6 months.  Nelva Bush, MD 09/20/2017 1:43 PM

## 2017-09-21 ENCOUNTER — Encounter: Payer: Self-pay | Admitting: Internal Medicine

## 2017-09-21 DIAGNOSIS — N1330 Unspecified hydronephrosis: Secondary | ICD-10-CM | POA: Diagnosis not present

## 2017-09-21 DIAGNOSIS — M810 Age-related osteoporosis without current pathological fracture: Secondary | ICD-10-CM | POA: Diagnosis not present

## 2017-09-21 DIAGNOSIS — E118 Type 2 diabetes mellitus with unspecified complications: Secondary | ICD-10-CM | POA: Diagnosis not present

## 2017-09-21 DIAGNOSIS — E559 Vitamin D deficiency, unspecified: Secondary | ICD-10-CM | POA: Diagnosis not present

## 2017-09-21 DIAGNOSIS — I10 Essential (primary) hypertension: Secondary | ICD-10-CM | POA: Diagnosis not present

## 2017-09-21 DIAGNOSIS — E1165 Type 2 diabetes mellitus with hyperglycemia: Secondary | ICD-10-CM | POA: Diagnosis not present

## 2017-09-21 DIAGNOSIS — E109 Type 1 diabetes mellitus without complications: Secondary | ICD-10-CM | POA: Insufficient documentation

## 2017-09-21 DIAGNOSIS — N200 Calculus of kidney: Secondary | ICD-10-CM | POA: Diagnosis not present

## 2017-09-21 DIAGNOSIS — R69 Illness, unspecified: Secondary | ICD-10-CM | POA: Diagnosis not present

## 2017-09-21 DIAGNOSIS — I469 Cardiac arrest, cause unspecified: Secondary | ICD-10-CM | POA: Diagnosis not present

## 2017-09-21 DIAGNOSIS — I340 Nonrheumatic mitral (valve) insufficiency: Secondary | ICD-10-CM | POA: Diagnosis not present

## 2017-09-21 DIAGNOSIS — E108 Type 1 diabetes mellitus with unspecified complications: Secondary | ICD-10-CM | POA: Insufficient documentation

## 2017-09-22 DIAGNOSIS — E1165 Type 2 diabetes mellitus with hyperglycemia: Secondary | ICD-10-CM | POA: Diagnosis not present

## 2017-09-22 DIAGNOSIS — E559 Vitamin D deficiency, unspecified: Secondary | ICD-10-CM | POA: Diagnosis not present

## 2017-09-22 DIAGNOSIS — N200 Calculus of kidney: Secondary | ICD-10-CM | POA: Diagnosis not present

## 2017-09-22 DIAGNOSIS — N1330 Unspecified hydronephrosis: Secondary | ICD-10-CM | POA: Diagnosis not present

## 2017-09-22 DIAGNOSIS — M810 Age-related osteoporosis without current pathological fracture: Secondary | ICD-10-CM | POA: Diagnosis not present

## 2017-09-22 DIAGNOSIS — F418 Other specified anxiety disorders: Secondary | ICD-10-CM | POA: Diagnosis not present

## 2017-09-22 DIAGNOSIS — N209 Urinary calculus, unspecified: Secondary | ICD-10-CM | POA: Diagnosis not present

## 2017-09-22 DIAGNOSIS — R69 Illness, unspecified: Secondary | ICD-10-CM | POA: Diagnosis not present

## 2017-09-22 DIAGNOSIS — I10 Essential (primary) hypertension: Secondary | ICD-10-CM | POA: Diagnosis not present

## 2017-09-22 DIAGNOSIS — I340 Nonrheumatic mitral (valve) insufficiency: Secondary | ICD-10-CM | POA: Diagnosis not present

## 2017-09-22 DIAGNOSIS — I469 Cardiac arrest, cause unspecified: Secondary | ICD-10-CM | POA: Diagnosis not present

## 2017-09-26 DIAGNOSIS — I469 Cardiac arrest, cause unspecified: Secondary | ICD-10-CM | POA: Diagnosis not present

## 2017-09-26 DIAGNOSIS — E559 Vitamin D deficiency, unspecified: Secondary | ICD-10-CM | POA: Diagnosis not present

## 2017-09-26 DIAGNOSIS — I340 Nonrheumatic mitral (valve) insufficiency: Secondary | ICD-10-CM | POA: Diagnosis not present

## 2017-09-26 DIAGNOSIS — M810 Age-related osteoporosis without current pathological fracture: Secondary | ICD-10-CM | POA: Diagnosis not present

## 2017-09-26 DIAGNOSIS — E1165 Type 2 diabetes mellitus with hyperglycemia: Secondary | ICD-10-CM | POA: Diagnosis not present

## 2017-09-26 DIAGNOSIS — N200 Calculus of kidney: Secondary | ICD-10-CM | POA: Diagnosis not present

## 2017-09-26 DIAGNOSIS — N3001 Acute cystitis with hematuria: Secondary | ICD-10-CM | POA: Diagnosis not present

## 2017-09-26 DIAGNOSIS — N1330 Unspecified hydronephrosis: Secondary | ICD-10-CM | POA: Diagnosis not present

## 2017-09-26 DIAGNOSIS — I10 Essential (primary) hypertension: Secondary | ICD-10-CM | POA: Diagnosis not present

## 2017-09-26 DIAGNOSIS — R69 Illness, unspecified: Secondary | ICD-10-CM | POA: Diagnosis not present

## 2017-10-01 DIAGNOSIS — R69 Illness, unspecified: Secondary | ICD-10-CM | POA: Diagnosis not present

## 2017-10-06 DIAGNOSIS — J45998 Other asthma: Secondary | ICD-10-CM | POA: Diagnosis not present

## 2017-10-06 DIAGNOSIS — E1165 Type 2 diabetes mellitus with hyperglycemia: Secondary | ICD-10-CM | POA: Diagnosis not present

## 2017-10-06 DIAGNOSIS — R69 Illness, unspecified: Secondary | ICD-10-CM | POA: Diagnosis not present

## 2017-10-06 DIAGNOSIS — E118 Type 2 diabetes mellitus with unspecified complications: Secondary | ICD-10-CM | POA: Diagnosis not present

## 2017-10-06 DIAGNOSIS — M810 Age-related osteoporosis without current pathological fracture: Secondary | ICD-10-CM | POA: Diagnosis not present

## 2017-10-06 DIAGNOSIS — I469 Cardiac arrest, cause unspecified: Secondary | ICD-10-CM | POA: Diagnosis not present

## 2017-10-06 DIAGNOSIS — E559 Vitamin D deficiency, unspecified: Secondary | ICD-10-CM | POA: Diagnosis not present

## 2017-10-06 DIAGNOSIS — E7800 Pure hypercholesterolemia, unspecified: Secondary | ICD-10-CM | POA: Diagnosis not present

## 2017-10-06 DIAGNOSIS — I10 Essential (primary) hypertension: Secondary | ICD-10-CM | POA: Diagnosis not present

## 2017-10-06 DIAGNOSIS — I340 Nonrheumatic mitral (valve) insufficiency: Secondary | ICD-10-CM | POA: Diagnosis not present

## 2017-10-23 DIAGNOSIS — Z23 Encounter for immunization: Secondary | ICD-10-CM | POA: Diagnosis not present

## 2017-10-23 DIAGNOSIS — Z136 Encounter for screening for cardiovascular disorders: Secondary | ICD-10-CM | POA: Diagnosis not present

## 2017-10-23 DIAGNOSIS — Z1331 Encounter for screening for depression: Secondary | ICD-10-CM | POA: Diagnosis not present

## 2017-10-23 DIAGNOSIS — Z0000 Encounter for general adult medical examination without abnormal findings: Secondary | ICD-10-CM | POA: Diagnosis not present

## 2017-10-23 DIAGNOSIS — Z1211 Encounter for screening for malignant neoplasm of colon: Secondary | ICD-10-CM | POA: Diagnosis not present

## 2017-11-02 DIAGNOSIS — R0989 Other specified symptoms and signs involving the circulatory and respiratory systems: Secondary | ICD-10-CM | POA: Diagnosis not present

## 2017-11-20 ENCOUNTER — Other Ambulatory Visit: Payer: Self-pay | Admitting: Internal Medicine

## 2017-12-08 DIAGNOSIS — E109 Type 1 diabetes mellitus without complications: Secondary | ICD-10-CM | POA: Diagnosis not present

## 2017-12-20 DIAGNOSIS — R69 Illness, unspecified: Secondary | ICD-10-CM | POA: Diagnosis not present

## 2017-12-26 ENCOUNTER — Ambulatory Visit: Payer: Medicare HMO | Admitting: General Surgery

## 2018-01-15 DIAGNOSIS — E7800 Pure hypercholesterolemia, unspecified: Secondary | ICD-10-CM | POA: Diagnosis not present

## 2018-01-15 DIAGNOSIS — E1165 Type 2 diabetes mellitus with hyperglycemia: Secondary | ICD-10-CM | POA: Diagnosis not present

## 2018-01-22 DIAGNOSIS — E118 Type 2 diabetes mellitus with unspecified complications: Secondary | ICD-10-CM | POA: Diagnosis not present

## 2018-01-22 DIAGNOSIS — Z4681 Encounter for fitting and adjustment of insulin pump: Secondary | ICD-10-CM | POA: Diagnosis not present

## 2018-01-22 DIAGNOSIS — E559 Vitamin D deficiency, unspecified: Secondary | ICD-10-CM | POA: Diagnosis not present

## 2018-01-22 DIAGNOSIS — I10 Essential (primary) hypertension: Secondary | ICD-10-CM | POA: Diagnosis not present

## 2018-01-22 DIAGNOSIS — E7800 Pure hypercholesterolemia, unspecified: Secondary | ICD-10-CM | POA: Diagnosis not present

## 2018-01-22 DIAGNOSIS — R69 Illness, unspecified: Secondary | ICD-10-CM | POA: Diagnosis not present

## 2018-01-22 DIAGNOSIS — J0100 Acute maxillary sinusitis, unspecified: Secondary | ICD-10-CM | POA: Diagnosis not present

## 2018-01-22 DIAGNOSIS — I2510 Atherosclerotic heart disease of native coronary artery without angina pectoris: Secondary | ICD-10-CM | POA: Diagnosis not present

## 2018-01-22 DIAGNOSIS — I340 Nonrheumatic mitral (valve) insufficiency: Secondary | ICD-10-CM | POA: Diagnosis not present

## 2018-01-22 DIAGNOSIS — E1165 Type 2 diabetes mellitus with hyperglycemia: Secondary | ICD-10-CM | POA: Diagnosis not present

## 2018-01-22 DIAGNOSIS — I469 Cardiac arrest, cause unspecified: Secondary | ICD-10-CM | POA: Diagnosis not present

## 2018-02-04 DIAGNOSIS — J209 Acute bronchitis, unspecified: Secondary | ICD-10-CM | POA: Diagnosis not present

## 2018-02-06 DIAGNOSIS — Z9641 Presence of insulin pump (external) (internal): Secondary | ICD-10-CM | POA: Diagnosis not present

## 2018-02-06 DIAGNOSIS — E118 Type 2 diabetes mellitus with unspecified complications: Secondary | ICD-10-CM | POA: Diagnosis not present

## 2018-02-06 DIAGNOSIS — I10 Essential (primary) hypertension: Secondary | ICD-10-CM | POA: Diagnosis not present

## 2018-02-06 DIAGNOSIS — R69 Illness, unspecified: Secondary | ICD-10-CM | POA: Diagnosis not present

## 2018-02-06 DIAGNOSIS — E559 Vitamin D deficiency, unspecified: Secondary | ICD-10-CM | POA: Diagnosis not present

## 2018-02-06 DIAGNOSIS — N209 Urinary calculus, unspecified: Secondary | ICD-10-CM | POA: Diagnosis not present

## 2018-02-06 DIAGNOSIS — J45909 Unspecified asthma, uncomplicated: Secondary | ICD-10-CM | POA: Diagnosis not present

## 2018-02-06 DIAGNOSIS — J84114 Acute interstitial pneumonitis: Secondary | ICD-10-CM | POA: Diagnosis not present

## 2018-02-06 DIAGNOSIS — I469 Cardiac arrest, cause unspecified: Secondary | ICD-10-CM | POA: Diagnosis not present

## 2018-02-06 DIAGNOSIS — I340 Nonrheumatic mitral (valve) insufficiency: Secondary | ICD-10-CM | POA: Diagnosis not present

## 2018-02-27 DIAGNOSIS — E118 Type 2 diabetes mellitus with unspecified complications: Secondary | ICD-10-CM | POA: Diagnosis not present

## 2018-02-27 DIAGNOSIS — I340 Nonrheumatic mitral (valve) insufficiency: Secondary | ICD-10-CM | POA: Diagnosis not present

## 2018-02-27 DIAGNOSIS — J09X1 Influenza due to identified novel influenza A virus with pneumonia: Secondary | ICD-10-CM | POA: Diagnosis not present

## 2018-02-27 DIAGNOSIS — I469 Cardiac arrest, cause unspecified: Secondary | ICD-10-CM | POA: Diagnosis not present

## 2018-02-27 DIAGNOSIS — J158 Pneumonia due to other specified bacteria: Secondary | ICD-10-CM | POA: Diagnosis not present

## 2018-02-27 DIAGNOSIS — I10 Essential (primary) hypertension: Secondary | ICD-10-CM | POA: Diagnosis not present

## 2018-02-27 DIAGNOSIS — J45909 Unspecified asthma, uncomplicated: Secondary | ICD-10-CM | POA: Diagnosis not present

## 2018-02-27 DIAGNOSIS — Z9641 Presence of insulin pump (external) (internal): Secondary | ICD-10-CM | POA: Diagnosis not present

## 2018-02-27 DIAGNOSIS — I2510 Atherosclerotic heart disease of native coronary artery without angina pectoris: Secondary | ICD-10-CM | POA: Diagnosis not present

## 2018-03-07 DIAGNOSIS — I2510 Atherosclerotic heart disease of native coronary artery without angina pectoris: Secondary | ICD-10-CM | POA: Diagnosis not present

## 2018-03-07 DIAGNOSIS — J09X1 Influenza due to identified novel influenza A virus with pneumonia: Secondary | ICD-10-CM | POA: Diagnosis not present

## 2018-03-07 DIAGNOSIS — N209 Urinary calculus, unspecified: Secondary | ICD-10-CM | POA: Diagnosis not present

## 2018-03-07 DIAGNOSIS — E118 Type 2 diabetes mellitus with unspecified complications: Secondary | ICD-10-CM | POA: Diagnosis not present

## 2018-03-07 DIAGNOSIS — I469 Cardiac arrest, cause unspecified: Secondary | ICD-10-CM | POA: Diagnosis not present

## 2018-03-07 DIAGNOSIS — J45909 Unspecified asthma, uncomplicated: Secondary | ICD-10-CM | POA: Diagnosis not present

## 2018-03-07 DIAGNOSIS — I10 Essential (primary) hypertension: Secondary | ICD-10-CM | POA: Diagnosis not present

## 2018-03-07 DIAGNOSIS — J158 Pneumonia due to other specified bacteria: Secondary | ICD-10-CM | POA: Diagnosis not present

## 2018-03-07 DIAGNOSIS — Z9641 Presence of insulin pump (external) (internal): Secondary | ICD-10-CM | POA: Diagnosis not present

## 2018-03-07 DIAGNOSIS — I340 Nonrheumatic mitral (valve) insufficiency: Secondary | ICD-10-CM | POA: Diagnosis not present

## 2018-03-20 DIAGNOSIS — D2261 Melanocytic nevi of right upper limb, including shoulder: Secondary | ICD-10-CM | POA: Diagnosis not present

## 2018-03-20 DIAGNOSIS — D2272 Melanocytic nevi of left lower limb, including hip: Secondary | ICD-10-CM | POA: Diagnosis not present

## 2018-03-20 DIAGNOSIS — D485 Neoplasm of uncertain behavior of skin: Secondary | ICD-10-CM | POA: Diagnosis not present

## 2018-03-20 DIAGNOSIS — C44519 Basal cell carcinoma of skin of other part of trunk: Secondary | ICD-10-CM | POA: Diagnosis not present

## 2018-03-20 DIAGNOSIS — D2271 Melanocytic nevi of right lower limb, including hip: Secondary | ICD-10-CM | POA: Diagnosis not present

## 2018-03-20 DIAGNOSIS — X32XXXA Exposure to sunlight, initial encounter: Secondary | ICD-10-CM | POA: Diagnosis not present

## 2018-03-20 DIAGNOSIS — L570 Actinic keratosis: Secondary | ICD-10-CM | POA: Diagnosis not present

## 2018-03-20 DIAGNOSIS — C44319 Basal cell carcinoma of skin of other parts of face: Secondary | ICD-10-CM | POA: Diagnosis not present

## 2018-03-20 DIAGNOSIS — D2262 Melanocytic nevi of left upper limb, including shoulder: Secondary | ICD-10-CM | POA: Diagnosis not present

## 2018-03-20 DIAGNOSIS — D0462 Carcinoma in situ of skin of left upper limb, including shoulder: Secondary | ICD-10-CM | POA: Diagnosis not present

## 2018-03-20 DIAGNOSIS — D225 Melanocytic nevi of trunk: Secondary | ICD-10-CM | POA: Diagnosis not present

## 2018-03-28 DIAGNOSIS — E118 Type 2 diabetes mellitus with unspecified complications: Secondary | ICD-10-CM | POA: Diagnosis not present

## 2018-03-28 DIAGNOSIS — K229 Disease of esophagus, unspecified: Secondary | ICD-10-CM | POA: Diagnosis not present

## 2018-03-28 DIAGNOSIS — I10 Essential (primary) hypertension: Secondary | ICD-10-CM | POA: Diagnosis not present

## 2018-03-28 DIAGNOSIS — E559 Vitamin D deficiency, unspecified: Secondary | ICD-10-CM | POA: Diagnosis not present

## 2018-03-28 DIAGNOSIS — Z9641 Presence of insulin pump (external) (internal): Secondary | ICD-10-CM | POA: Diagnosis not present

## 2018-03-28 DIAGNOSIS — Z4681 Encounter for fitting and adjustment of insulin pump: Secondary | ICD-10-CM | POA: Diagnosis not present

## 2018-03-28 DIAGNOSIS — I2510 Atherosclerotic heart disease of native coronary artery without angina pectoris: Secondary | ICD-10-CM | POA: Diagnosis not present

## 2018-03-28 DIAGNOSIS — I340 Nonrheumatic mitral (valve) insufficiency: Secondary | ICD-10-CM | POA: Diagnosis not present

## 2018-03-28 DIAGNOSIS — M810 Age-related osteoporosis without current pathological fracture: Secondary | ICD-10-CM | POA: Diagnosis not present

## 2018-04-02 NOTE — Progress Notes (Signed)
Follow-up Outpatient Visit Date: 04/04/2018  Primary Care Provider: Lenard Simmer, Lisa [name] [place] [id number]  Chief Complaint: Follow-up CAD  HPI:  Lisa [name] is a 68 y.o. year-old female with history of coronary artery disease status post remote coronary intervention at [place] (the late 90s) and cardiac arrest in the setting of anaphylaxis with catheterization demonstrating severe 2 vessel CAD(subsequent atherectomy and PCI to RCA),recurrent pericardial effusion, type1 diabetes mellitus, peripheral vascular disease with AAA, right lower lobe pneumonia with mucous plugging, and spontaneous right thigh hematoma in the setting of anticoagulation, who presents for follow-up of coronary artery disease and recurrent pericardial effusion.  I last saw Lisa [name] in August, 2019, at which time she was doing very well.  We agreed to discontinue clopidogrel at that time due to concern for bleeding.  Today, Lisa [name] reports that she has been feeling well without chest pain, shortness of breath, palpitations, lightheadedness, and edema.  She has not needed to use PRN diltiazem or NTG.  She continues to exercise regularly and reports good BP control at home.  --------------------------------------------------------------------------------------------------  Cardiovascular History & Procedures: Cardiovascular Problems:  Coronary artery disease with VF arrest and transient complete heart block in the setting of anaphylactic shock  Recurrent pericardial effusion  Paroxysmal atrial fibrillation  Risk Factors:  Known coronary artery disease, peripheral vascular disease, and type 1 diabetes mellitus  Cath/PCI:  PCI (12/02/16): Successful orbital atherectomy and PCI to heavily calcified mid RCA disease with placement of a Xience Alpine 3.0 x 33 mm drug-eluting stent.  LHC (10/11/16): Severe two-vessel coronary artery disease including heavily calcified 95% proximal/mid LCx and  sequential 90-99% mid RCA stenoses. Mild to moderate, nonobstructive disease involving the LMCA and LAD. Basal inferior hypokinesis with overall preserved LV contraction. Mildly elevated LVEDP. Peripheral vascular disease with ectasia and calcification of the infrarenal abdominal aorta and iliac arteries.  CV Surgery:  None  EP Procedures and Devices:  None  Non-Invasive Evaluation(s):  Limited TTE (01/19/17): Trivial pericardial effusion, significantly decreased in size from prior study. LVEF 55-60%.  Limited TTE (12/13/16): Normal LV size with LVEF of 65-70%. Normal RV size and function. Moderate to large circumferential pericardial effusion, stable to slightly decreased in size compared with 12/09/16. No evidence of tamponadephysiology.  TTE (10/25/16): Normal LV size with mild to moderate LVH. LVEF 60-65% with grade 2 diastolic dysfunction. RV size and function. Normal PA pressure. Small-to-moderate pericardial effusion.  TTE (10/12/16): Normal LV size. LVEF 55-60%. Normal diastolic function. Normal RV size and function. Normal PA pressure.  Recent CV Pertinent Labs: Lab Results  Component Value Date   CHOL 81 10/13/2016   HDL 34 (L) 10/13/2016   LDLCALC 36 10/13/2016   TRIG 56 10/13/2016   CHOLHDL 2.4 10/13/2016   INR 0.94 11/23/2016   BNP 69.0 10/25/2016   K 4.2 05/13/2017   MG 1.6 (L) 12/08/2016   BUN 17 05/13/2017   CREATININE 0.85 05/13/2017    Past medical and surgical history were reviewed and updated in EPIC.  No outpatient medications have been marked as taking for the 04/04/18 encounter (Appointment) with Lisa [name], Lisa Gave, Lisa [name].    Allergies: Ceftriaxone; Penicillins; Rocephin [ceftriaxone sodium in dextrose]; and Albuterol  Social History   Tobacco Use  . Smoking status: Former Smoker    Packs/day: 0.25    Years: 47.00    Pack years: 11.75    Types: Cigarettes    Start date: 01/25/1969    Last attempt to quit: 10/11/2016  Years since quitting: 1.4    . Smokeless tobacco: Never Used  . Tobacco comment: 12/16/2016 Wants to quit, has not set quit date.  Has been informed about resources available for smoking cessation  Substance Use Topics  . Alcohol use: No    Alcohol/week: 0.0 standard drinks    Frequency: Never  . Drug use: No    Family History  Problem Relation Age of Onset  . Hypertension Mother   . Aortic aneurysm Father   . Hypertension Brother   . Lung disease Neg Hx   . Rheumatologic disease Neg Hx     Review of Systems: A 12-system review of systems was performed and was negative except as noted in the HPI.  --------------------------------------------------------------------------------------------------  Physical Exam: There were no vitals taken for this visit.  General:  NAD. HEENT: No conjunctival pallor or scleral icterus. Moist mucous membranes.  OP clear. Neck: Supple without lymphadenopathy, thyromegaly, JVD, or HJR. Lungs: Normal work of breathing. Clear to auscultation bilaterally without wheezes or crackles. Heart: Regular rate and rhythm without murmurs, rubs, or gallops. Non-displaced PMI. Abd: Bowel sounds present. Soft, NT/ND without hepatosplenomegaly Ext: No lower extremity edema. Radial, PT, and DP pulses are 2+ bilaterally. Skin: Warm and dry without rash.  EKG:  NSR without abnormalities.  Lab Results  Component Value Date   WBC 6.9 05/13/2017   HGB 12.1 05/13/2017   HCT 36.8 05/13/2017   MCV 82.2 05/13/2017   PLT 265 05/13/2017    Lab Results  Component Value Date   NA 131 (L) 05/13/2017   K 4.2 05/13/2017   CL 100 (L) 05/13/2017   CO2 26 05/13/2017   BUN 17 05/13/2017   CREATININE 0.85 05/13/2017   GLUCOSE 94 05/13/2017   ALT 20 05/07/2017    Lab Results  Component Value Date   CHOL 81 10/13/2016   HDL 34 (L) 10/13/2016   LDLCALC 36 10/13/2016   TRIG 56 10/13/2016   CHOLHDL 2.4 10/13/2016     --------------------------------------------------------------------------------------------------  ASSESSMENT AND PLAN: CAD No symptoms to suggest worsening coronary insufficiency.  Though she has known severe proximal LCx and moderate LAD disease, we have agreed to defer intervention in light of her challenging coronary anatomy and absent symptoms (CCS class I).  We will continue aggressive secondary prevention with atorvastatin and aspirin.  Clopidogrel previously discontinued due to elevated bleeding risk.  History of Pericardial effusion No symptoms to suggest recurrence.  No further work up at this time.  Paroxysmal atrial fibrillation Noted in the setting of acute illness without subsequent recurrence.  We will continue to defer anticoagulation given no evidence of recurrence and elevated bleeding risk.  Hyperlipidemia Ms. Abundis reports good control per labs followed by Dr. Ronnald Collum.  I will continue to defer lipid management to him; goal LDL < 70.  Follow-up: Return to clinic in 6 months.    Nelva Bush, Lisa [name] 04/02/2018 3:31 PM

## 2018-04-04 ENCOUNTER — Encounter: Payer: Self-pay | Admitting: Internal Medicine

## 2018-04-04 ENCOUNTER — Ambulatory Visit: Payer: Medicare HMO | Admitting: Internal Medicine

## 2018-04-04 VITALS — BP 120/78 | HR 83 | Ht 63.0 in | Wt 136.8 lb

## 2018-04-04 DIAGNOSIS — E785 Hyperlipidemia, unspecified: Secondary | ICD-10-CM | POA: Diagnosis not present

## 2018-04-04 DIAGNOSIS — I313 Pericardial effusion (noninflammatory): Secondary | ICD-10-CM

## 2018-04-04 DIAGNOSIS — I480 Paroxysmal atrial fibrillation: Secondary | ICD-10-CM

## 2018-04-04 DIAGNOSIS — I2510 Atherosclerotic heart disease of native coronary artery without angina pectoris: Secondary | ICD-10-CM

## 2018-04-04 DIAGNOSIS — I3139 Other pericardial effusion (noninflammatory): Secondary | ICD-10-CM

## 2018-04-04 MED ORDER — DILTIAZEM HCL 30 MG PO TABS: ORAL_TABLET | ORAL | 5 refills | Status: DC

## 2018-04-04 MED ORDER — NITROGLYCERIN 0.4 MG SL SUBL: 0.4000 mg | SUBLINGUAL_TABLET | SUBLINGUAL | 6 refills | Status: DC | PRN

## 2018-04-04 NOTE — Patient Instructions (Signed)
Medication Instructions:  Your physician recommends that you continue on your current medications as directed. Please refer to the Current Medication list given to you today.  If you need a refill on your cardiac medications before your next appointment, please call your pharmacy.   Lab work: none If you have labs (blood work) drawn today and your tests are completely normal, you will receive your results only by: . MyChart Message (if you have MyChart) OR . A paper copy in the mail If you have any lab test that is abnormal or we need to change your treatment, we will call you to review the results.  Testing/Procedures: none  Follow-Up: At [facility], you and your health needs are our priority.  As part of our continuing mission to provide you with exceptional heart care, we have created designated Provider Care Teams.  These Care Teams include your primary Cardiologist (physician) and Advanced Practice Providers (APPs -  Physician Assistants and Nurse Practitioners) who all work together to provide you with the care you need, when you need it. You will need a follow up appointment in 6 months.  Please call our office 2 months in advance to schedule this appointment.  You may see Christopher End, MD or one of the following Advanced Practice Providers on your designated Care Team:   Christopher Berge, NP Ryan Dunn, PA-C . Jacquelyn Visser, PA-C     

## 2018-04-10 ENCOUNTER — Encounter: Payer: Self-pay | Admitting: General Surgery

## 2018-04-10 ENCOUNTER — Ambulatory Visit: Payer: Medicare HMO | Admitting: General Surgery

## 2018-04-10 ENCOUNTER — Other Ambulatory Visit: Payer: Self-pay

## 2018-04-10 VITALS — BP 120/70 | HR 72 | Temp 97.8°F | Ht 64.0 in | Wt 136.0 lb

## 2018-04-10 DIAGNOSIS — Z1211 Encounter for screening for malignant neoplasm of colon: Secondary | ICD-10-CM | POA: Diagnosis not present

## 2018-04-10 DIAGNOSIS — R151 Fecal smearing: Secondary | ICD-10-CM

## 2018-04-10 NOTE — Patient Instructions (Signed)
Colonoscopy, Adult A colonoscopy is an exam to look at the entire large intestine. During the exam, a lubricated, flexible tube that has a camera on the end of it is inserted into the anus and then passed into the rectum, colon, and other parts of the large intestine. You may have a colonoscopy as a part of normal colorectal screening or if you have certain symptoms, such as:  Lack of red blood cells (anemia).  Diarrhea that does not go away.  Abdominal pain.  Blood in your stool (feces). A colonoscopy can help screen for and diagnose medical problems, including:  Tumors.  Polyps.  Inflammation.  Areas of bleeding. Tell a health care provider about:  Any allergies you have.  All medicines you are taking, including vitamins, herbs, eye drops, creams, and over-the-counter medicines.  Any problems you or family members have had with anesthetic medicines.  Any blood disorders you have.  Any surgeries you have had.  Any medical conditions you have.  Any problems you have had passing stool. What are the risks? Generally, this is a safe procedure. However, problems may occur, including:  Bleeding.  A tear in the intestine.  A reaction to medicines given during the exam.  Infection (rare). What happens before the procedure? Eating and drinking restrictions Follow instructions from your health care provider about eating and drinking, which may include:  A few days before the procedure - follow a low-fiber diet. Avoid nuts, seeds, dried fruit, raw fruits, and vegetables.  1-3 days before the procedure - follow a clear liquid diet. Drink only clear liquids, such as clear broth or bouillon, black coffee or tea, clear juice, clear soft drinks or sports drinks, gelatin dessert, and popsicles. Avoid any liquids that contain red or purple dye.  On the day of the procedure - do not eat or drink anything starting 2 hours before the procedure, or within the time period that your  health care provider recommends. Up to 2 hours before the procedure, you may continue to drink clear liquids, such as water or clear fruit juice. Bowel prep If you were prescribed an oral bowel prep to clean out your colon:  Take it as told by your health care provider. Starting the day before your procedure, you will need to drink a large amount of medicated liquid. The liquid will cause you to have multiple loose stools until your stool is almost clear or light green.  If your skin or anus gets irritated from diarrhea, you may use these to relieve the irritation: ? Medicated wipes, such as adult wet wipes with aloe and vitamin E. ? A skin-soothing product like petroleum jelly.  If you vomit while drinking the bowel prep, take a break for up to 60 minutes and then begin the bowel prep again. If vomiting continues and you cannot take the bowel prep without vomiting, call your health care provider.  To clean out your colon, you may also be given: ? Laxative medicines. ? Instructions about how to use an enema. General instructions  Ask your health care provider about: ? Changing or stopping your regular medicines or supplements. This is especially important if you are taking iron supplements, diabetes medicines, or blood thinners. ? Taking medicines such as aspirin and ibuprofen. These medicines can thin your blood. Do not take these medicines before the procedure if your health care provider tells you not to.  Plan to have someone take you home from the hospital or clinic. What happens during the procedure?     An IV may be inserted into one of your veins.  You will be given medicine to help you relax (sedative).  To reduce your risk of infection: ? Your health care team will wash or sanitize their hands. ? Your anal area will be washed with soap.  You will be asked to lie on your side with your knees bent.  Your health care provider will lubricate a long, thin, flexible tube. The  tube will have a camera and a light on the end.  The tube will be inserted into your anus.  The tube will be gently eased through your rectum and colon.  Air will be delivered into your colon to keep it open. You may feel some pressure or cramping.  The camera will be used to take images during the procedure.  A small tissue sample may be removed to be examined under a microscope (biopsy).  If small polyps are found, your health care provider may remove them and have them checked for cancer cells.  When the exam is done, the tube will be removed. The procedure may vary among health care providers and hospitals. What happens after the procedure?  Your blood pressure, heart rate, breathing rate, and blood oxygen level will be monitored until the medicines you were given have worn off.  Do not drive for 24 hours after the exam.  You may have a small amount of blood in your stool.  You may pass gas and have mild abdominal cramping or bloating due to the air that was used to inflate your colon during the exam.  It is up to you to get the results of your procedure. Ask your health care provider, or the department performing the procedure, when your results will be ready. Summary  A colonoscopy is an exam to look at the entire large intestine.  During a colonoscopy, a lubricated, flexible tube with a camera on the end of it is inserted into the anus and then passed into the colon and other parts of the large intestine.  Follow instructions from your health care provider about eating and drinking before the procedure.  If you were prescribed an oral bowel prep to clean out your colon, take it as told by your health care provider.  After your procedure, your blood pressure, heart rate, breathing rate, and blood oxygen level will be monitored until the medicines you were given have worn off. This information is not intended to replace advice given to you by your health care provider. Make  sure you discuss any questions you have with your health care provider. Document Released: 01/15/2000 Document Revised: 11/09/2016 Document Reviewed: 03/31/2015 [employer]vier Interactive Patient Education  2019 [employer]  

## 2018-04-10 NOTE — Progress Notes (Signed)
Patient ID: Lisa [name], female   DOB: 04/03/1950, 68 y.o.   MRN: [id number]  Chief Complaint  Patient presents with  . Colonoscopy    HPI LELAND STASZEWSKI is a 68 y.o. female Here today for evaluation of a screening colonoscopy. Patient states she has started having some discharge rectal discharge after bowel movements over the past 4 months.  This is necessitated wearing a pad at all times and occasionally requiring a change in clothing.  She reports no urinary leakage.  Bowels move regular and no bleeding.  Last colonoscopy was 02/16/2006. HPI  Past Medical History:  Diagnosis Date  . CAD (coronary artery disease)    a. 1999 PTCA @ [facility]; b. NSTEMI/Cath 10/11/2016: LM 20%, oLAD 40%, p-mLAD 40%, dLAD 40%, p-mLCx 95%, pRCA 30%, 95/39m, EF 55-65%; c. 10/2016 CT Surgery->rec PCI due to poor lung fxn. d. 12/02/16 DES to mid RCA.  . Groin hematoma    a. 10/2016 Spont groin hematoma w/ anemia req 1u prbcs.  . High cholesterol   . History of kidney stones   . Mucus plugging of bronchi    a. 10/2016 RLL Bronchus obstruction->bronchus vs mass;  b. 10/2016 CT Chest: resolution of RLL mucous plug.  . NSTEMI (non-ST elevated myocardial infarction) ([place]) 10/11/2016  . PAF (paroxysmal atrial fibrillation) ([place])    a. 12/2016 Noted during diag cath--CHA2DS2VASc = 3. No OAC in setting of need for DAPT and h/o spont Thigh Hematoma in 10/2016.  Marland Kitchen Pericardial effusion    a. 10/2016 Echo: EF 60-65%, no rwma, Gr2 DD, small to mod pericardial eff. No tamponade; b. 12/2016 Echo: EF 60-65%, no rwma, Gr1 DD, 1.2 to 1.4 cm mod to large circumferential pericardial effusion w/o tamponade; c. 12/2016 Echo: EF 55-60%, no rwma, triv effusion.  . Pneumonia 02/2016   "couple times before 02/2016 too" (12/02/2016)  . PVD (peripheral vascular disease) ([place])    a. Abdominal aortogram 10/11/16: Infrarenal abdominal aorta heavily calcified & ectatic with approximately 40-50% stenosis at the bifurcation. Mild diffuse disease w/ heavy  calcification noted in the common & external iliac arteries bilaterally  . Type I diabetes mellitus ([place])    a. On insulin pump.(12/02/2016)  . Ventricular fibrillation ([place])    a. 10/11/2016: Felt to be secondary to demand ischemia in the setting of underlying CAD and anaphylactic reaction from IV Rocephin; b. 10/2016 Echo: EF 55-60%, ? mild inf HK; c. 10/2016 Seen by EP: No ICD indication.    Past Surgical History:  Procedure Laterality Date  . ABDOMINAL AORTOGRAM N/A 10/11/2016   Procedure: ABDOMINAL AORTOGRAM;  Surgeon: Nelva Bush, MD;  Location: [place] [facility];  Service: Cardiovascular;  Laterality: N/A;  . CARDIAC CATHETERIZATION  10/2016  . CERVICAL BIOPSY  W/ LOOP ELECTRODE EXCISION  03/05/2012  . COLONOSCOPY    . COLPOSCOPY  01/17/2011  . CORONARY ANGIOPLASTY WITH STENT PLACEMENT  1999; 12/02/2016   "1 stent  + 1 stent"  . CORONARY ATHERECTOMY N/A 12/02/2016   Procedure: CORONARY ATHERECTOMY - CSI;  Surgeon: Nelva Bush, MD;  Location: [place] [facility];  Service: Cardiovascular;  Laterality: N/A;  . CORONARY STENT INTERVENTION N/A 12/02/2016   Procedure: CORONARY STENT INTERVENTION;  Surgeon: Nelva Bush, MD;  Location: [place] [facility];  Service: Cardiovascular;  Laterality: N/A;  . LEFT HEART CATH AND CORONARY ANGIOGRAPHY N/A 10/11/2016   Procedure: LEFT HEART CATH AND CORONARY ANGIOGRAPHY;  Surgeon: Nelva Bush, MD;  Location: [place] [facility];  Service: Cardiovascular;  Laterality: N/A;  .  NASAL SINUS SURGERY  ~ 2009  . TEMPORARY PACEMAKER N/A 12/02/2016   Procedure: TEMPORARY PACEMAKER;  Surgeon: Nelva Bush, MD;  Location: [place] [facility];  Service: Cardiovascular;  Laterality: N/A;  . TONSILLECTOMY      Family History  Problem Relation Age of Onset  . Hypertension Mother   . Aortic aneurysm Father   . Hypertension Brother   . Lung disease Neg Hx   . Rheumatologic disease Neg Hx     Social History Social History   Tobacco Use   . Smoking status: Former Smoker    Packs/day: 0.25    Years: 47.00    Pack years: 11.75    Types: Cigarettes    Start date: 01/25/1969    Last attempt to quit: 10/11/2016    Years since quitting: 1.4  . Smokeless tobacco: Never Used  . Tobacco comment: 12/16/2016 Wants to quit, has not set quit date.  Has been informed about resources available for smoking cessation  Substance Use Topics  . Alcohol use: No    Alcohol/week: 0.0 standard drinks    Frequency: Never  . Drug use: No    Allergies  Allergen Reactions  . Ceftriaxone     Anaphylaxis, cardiac arrest  . Penicillins Anaphylaxis  . Rocephin [Ceftriaxone Sodium In Dextrose] Anaphylaxis  . Albuterol Other (See Comments)    Tachycardia-->Afib. Tolerates Xopenex    Current Outpatient Medications  Medication Sig Dispense Refill  . ALPRAZolam (XANAX) 0.25 MG tablet Take 0.25 mg by mouth 2 (two) times daily as needed (for anxiety.).     Marland Kitchen aspirin EC 81 MG tablet Take 81 mg by mouth daily.    Marland Kitchen atorvastatin (LIPITOR) 40 MG tablet Take 40 mg by mouth at bedtime.     Marland Kitchen diltiazem (CARDIZEM CD) 240 MG 24 hr capsule TAKE 1 CAPSULE BY MOUTH EVERY DAY 30 capsule 6  . insulin aspart (NOVOLOG) cartridge Inject into the skin 3 (three) times daily with meals.    . nitroGLYCERIN (NITROSTAT) 0.4 MG SL tablet Place 1 tablet (0.4 mg total) under the tongue every 5 (five) minutes x 3 doses as needed for chest pain. 25 tablet 6  . PROLIA 60 MG/ML SOSY injection every 6 (six) months.     . Vitamin D, Ergocalciferol, (DRISDOL) 50000 units CAPS capsule Take 50,000 Units by mouth every 30 (thirty) days.      No current facility-administered medications for this visit.     Review of Systems Review of Systems  Constitutional: Negative.   Respiratory: Negative.   Cardiovascular: Negative.  Negative for chest pain and leg swelling.  Endocrine: Negative.     Blood pressure 120/70, pulse 72, temperature 97.8 F (36.6 C), temperature source Skin,  height 5\' 4"  (1.626 m), weight 136 lb (61.7 kg), SpO2 99 %.  Physical Exam Physical Exam Constitutional:      Appearance: Normal appearance.  Eyes:     General: No scleral icterus. Neck:     Musculoskeletal: Normal range of motion and neck supple.  Cardiovascular:     Rate and Rhythm: Normal rate and regular rhythm.     Pulses: Normal pulses.     Heart sounds: Normal heart sounds.  Pulmonary:     Effort: Pulmonary effort is normal.     Breath sounds: Normal breath sounds.  Skin:    General: Skin is warm and dry.  Neurological:     Mental Status: She is alert and oriented to person, place, and time.  Data Reviewed Colonoscopy completed on December 18, 2006 showed 2 small hyperplastic polyps in the rectum.  Assessment Candidate for screening colonoscopy.  Change in stool habits with fecal soiling after bowel movements.  Plan  Colonoscopy with possible biopsy/polypectomy prn: Information regarding the procedure, including its potential risks and complications (including but not limited to perforation of the bowel, which may require emergency surgery to repair, and bleeding) was verbally given to the patient. Educational information regarding lower intestinal endoscopy was given to the patient. Written instructions for how to complete the bowel prep using Miralax were provided. The importance of drinking ample fluids to avoid dehydration as a result of the prep emphasized.  HPI, Physical Exam, Assessment and Plan have been scribed under the direction and in the presence of Hervey Ard, MD.  Gaspar Cola, CMA  I have completed the exam and reviewed the above documentation for accuracy and completeness.  I agree with the above.  Haematologist has been used and any errors in dictation or transcription are unintentional.  Hervey Ard, M.D., F.A.C.S.  Forest Gleason Brandie Lopes 04/11/2018, 11:38 AM

## 2018-04-11 DIAGNOSIS — R151 Fecal smearing: Secondary | ICD-10-CM | POA: Insufficient documentation

## 2018-04-11 DIAGNOSIS — Z1211 Encounter for screening for malignant neoplasm of colon: Secondary | ICD-10-CM | POA: Insufficient documentation

## 2018-04-12 ENCOUNTER — Telehealth: Payer: Self-pay

## 2018-04-12 DIAGNOSIS — R69 Illness, unspecified: Secondary | ICD-10-CM | POA: Diagnosis not present

## 2018-04-12 NOTE — Telephone Encounter (Signed)
Call to patient to schedule colonoscopy.  The patient is scheduled for a Colonoscopy at [facility] on 04/25/18. They are aware to call the day before to get their arrival time. She may continue her 81 mg Aspirin. She will only take her Diltiazem at 6 am with water. Miralax prescription has been sent into the patient's pharmacy. The patient is aware of date and instructions.

## 2018-04-13 ENCOUNTER — Other Ambulatory Visit: Payer: Self-pay | Admitting: General Surgery

## 2018-04-13 DIAGNOSIS — Z1211 Encounter for screening for malignant neoplasm of colon: Secondary | ICD-10-CM

## 2018-04-19 ENCOUNTER — Telehealth: Payer: Self-pay

## 2018-04-19 NOTE — Telephone Encounter (Signed)
Spoke with the patient and notified that we are canceling her Colonoscopy scheduled for 3/252/20 with Dr Bary Castilla for now due to the COVID virus. We will contact her to reschedule once we have a start date for elective procedures. She is aware to call with any questions.

## 2018-04-23 DIAGNOSIS — I2510 Atherosclerotic heart disease of native coronary artery without angina pectoris: Secondary | ICD-10-CM | POA: Diagnosis not present

## 2018-04-23 DIAGNOSIS — F418 Other specified anxiety disorders: Secondary | ICD-10-CM | POA: Diagnosis not present

## 2018-04-23 DIAGNOSIS — E7800 Pure hypercholesterolemia, unspecified: Secondary | ICD-10-CM | POA: Diagnosis not present

## 2018-04-23 DIAGNOSIS — J45998 Other asthma: Secondary | ICD-10-CM | POA: Diagnosis not present

## 2018-04-23 DIAGNOSIS — R69 Illness, unspecified: Secondary | ICD-10-CM | POA: Diagnosis not present

## 2018-04-23 DIAGNOSIS — I469 Cardiac arrest, cause unspecified: Secondary | ICD-10-CM | POA: Diagnosis not present

## 2018-04-23 DIAGNOSIS — E1165 Type 2 diabetes mellitus with hyperglycemia: Secondary | ICD-10-CM | POA: Diagnosis not present

## 2018-04-23 DIAGNOSIS — I340 Nonrheumatic mitral (valve) insufficiency: Secondary | ICD-10-CM | POA: Diagnosis not present

## 2018-04-23 DIAGNOSIS — I10 Essential (primary) hypertension: Secondary | ICD-10-CM | POA: Diagnosis not present

## 2018-04-23 DIAGNOSIS — M810 Age-related osteoporosis without current pathological fracture: Secondary | ICD-10-CM | POA: Diagnosis not present

## 2018-04-23 DIAGNOSIS — E559 Vitamin D deficiency, unspecified: Secondary | ICD-10-CM | POA: Diagnosis not present

## 2018-04-24 DIAGNOSIS — E109 Type 1 diabetes mellitus without complications: Secondary | ICD-10-CM | POA: Diagnosis not present

## 2018-04-25 ENCOUNTER — Ambulatory Visit: Admit: 2018-04-25 | Payer: Medicare HMO | Admitting: General Surgery

## 2018-04-25 DIAGNOSIS — M810 Age-related osteoporosis without current pathological fracture: Secondary | ICD-10-CM | POA: Diagnosis not present

## 2018-04-25 DIAGNOSIS — Z4681 Encounter for fitting and adjustment of insulin pump: Secondary | ICD-10-CM | POA: Diagnosis not present

## 2018-04-25 DIAGNOSIS — E118 Type 2 diabetes mellitus with unspecified complications: Secondary | ICD-10-CM | POA: Diagnosis not present

## 2018-04-25 DIAGNOSIS — N3001 Acute cystitis with hematuria: Secondary | ICD-10-CM | POA: Diagnosis not present

## 2018-04-25 DIAGNOSIS — E1165 Type 2 diabetes mellitus with hyperglycemia: Secondary | ICD-10-CM | POA: Diagnosis not present

## 2018-04-25 DIAGNOSIS — I2510 Atherosclerotic heart disease of native coronary artery without angina pectoris: Secondary | ICD-10-CM | POA: Diagnosis not present

## 2018-04-25 DIAGNOSIS — I10 Essential (primary) hypertension: Secondary | ICD-10-CM | POA: Diagnosis not present

## 2018-04-25 DIAGNOSIS — E559 Vitamin D deficiency, unspecified: Secondary | ICD-10-CM | POA: Diagnosis not present

## 2018-04-25 DIAGNOSIS — Z9641 Presence of insulin pump (external) (internal): Secondary | ICD-10-CM | POA: Diagnosis not present

## 2018-04-25 DIAGNOSIS — I469 Cardiac arrest, cause unspecified: Secondary | ICD-10-CM | POA: Diagnosis not present

## 2018-04-25 SURGERY — COLONOSCOPY WITH PROPOFOL
Anesthesia: General

## 2018-04-28 IMAGING — DX DG CHEST 1V PORT
1 series · 1 of 1 positions shown · non-contrast
Comparison: CT 10/25/2016.  Chest x-ray 10/25/2016 .

CLINICAL DATA: Shortness of breath.

EXAM:
PORTABLE CHEST 1 VIEW

[chest ap]
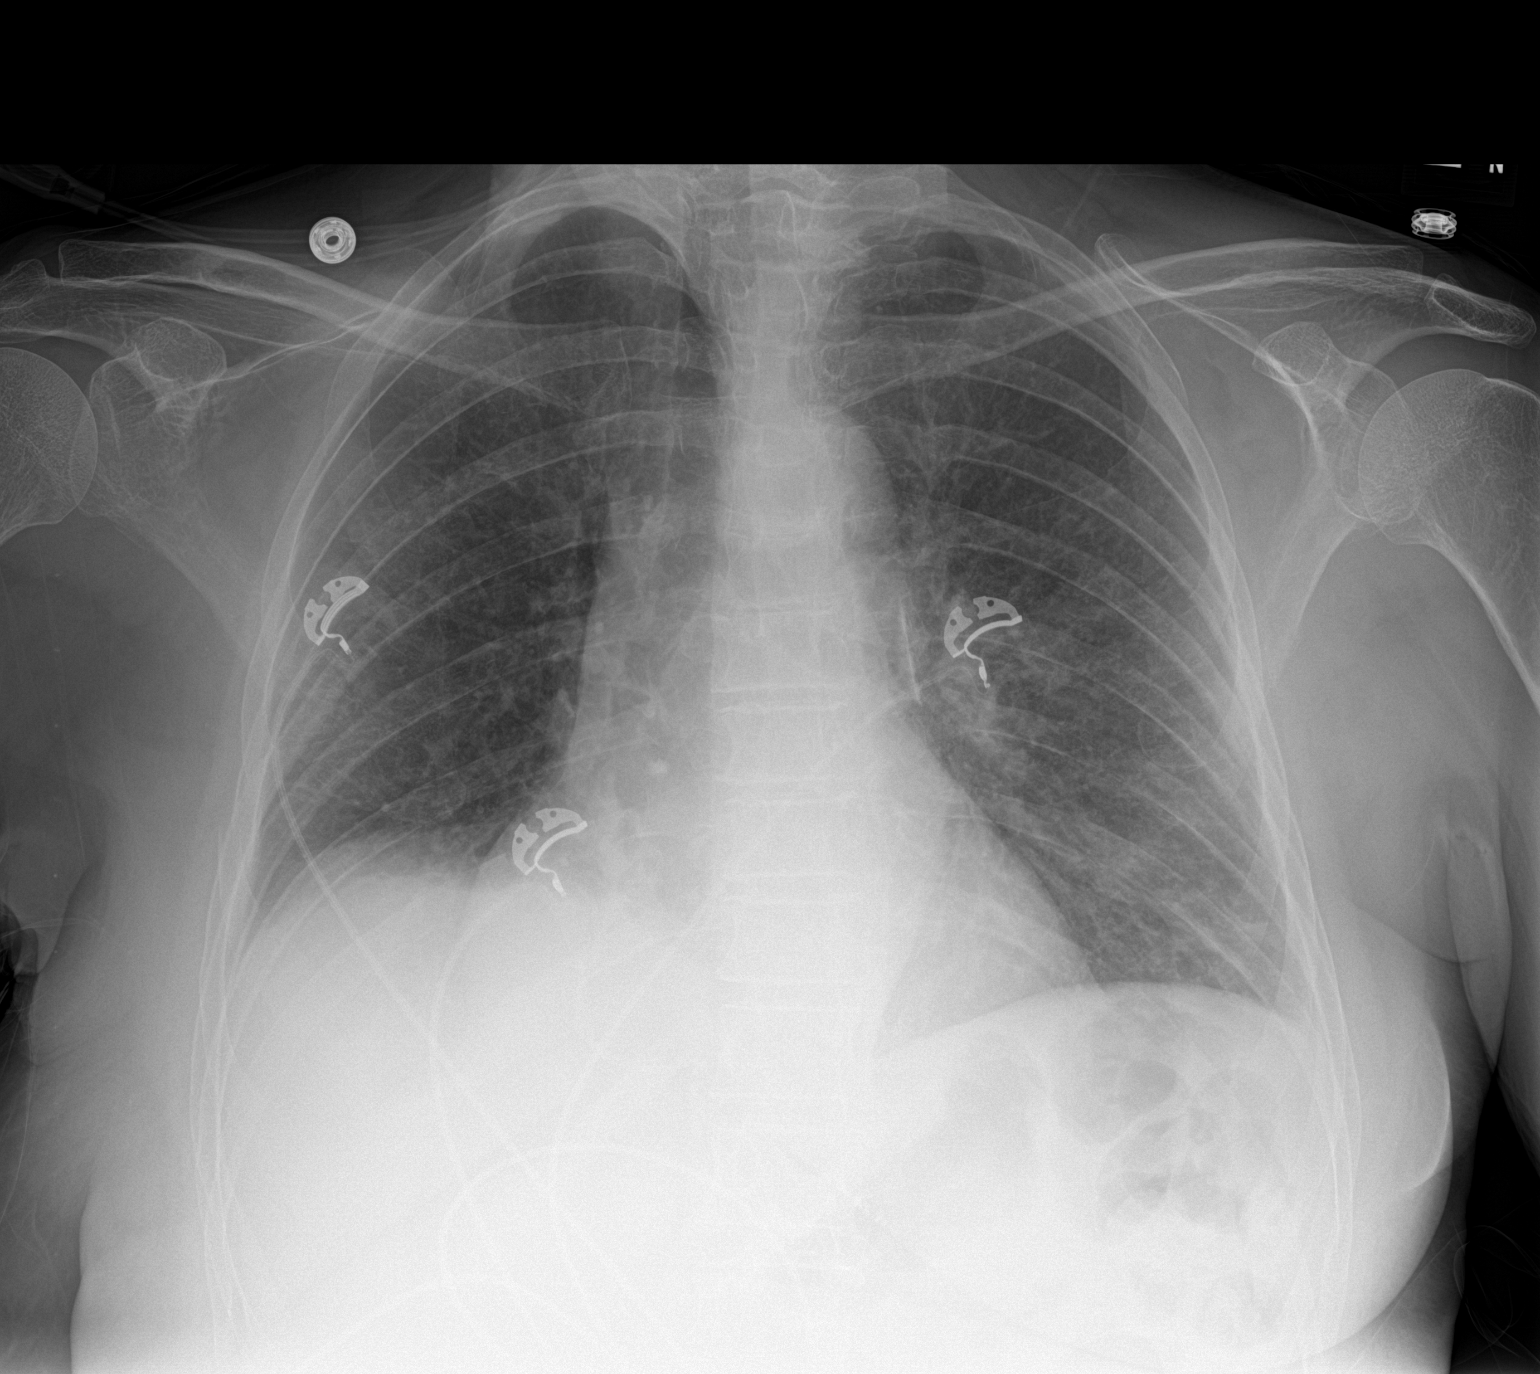

[1 of 1 positions shown; findings below may reference images not displayed]

FINDINGS: Mediastinum and hilar structures normal. Stable cardiomegaly.
Multifocal bilateral pulmonary infiltrates, improved from prior
exam. Small bilateral pleural effusions. No pneumothorax.
IMPRESSION: 1. Multifocal bilateral pulmonary infiltrates, slightly improved
from prior CT and chest x-ray of 10/25/2016. Continued follow-up
exams can be obtained. Small bilateral pleural effusions.

2. Stable cardiomegaly .

## 2018-05-09 DIAGNOSIS — F418 Other specified anxiety disorders: Secondary | ICD-10-CM | POA: Diagnosis not present

## 2018-05-09 DIAGNOSIS — Z9641 Presence of insulin pump (external) (internal): Secondary | ICD-10-CM | POA: Diagnosis not present

## 2018-05-09 DIAGNOSIS — E1165 Type 2 diabetes mellitus with hyperglycemia: Secondary | ICD-10-CM | POA: Diagnosis not present

## 2018-05-09 DIAGNOSIS — M810 Age-related osteoporosis without current pathological fracture: Secondary | ICD-10-CM | POA: Diagnosis not present

## 2018-05-09 DIAGNOSIS — N3001 Acute cystitis with hematuria: Secondary | ICD-10-CM | POA: Diagnosis not present

## 2018-05-09 DIAGNOSIS — I469 Cardiac arrest, cause unspecified: Secondary | ICD-10-CM | POA: Diagnosis not present

## 2018-05-09 DIAGNOSIS — I10 Essential (primary) hypertension: Secondary | ICD-10-CM | POA: Diagnosis not present

## 2018-05-09 DIAGNOSIS — Z4681 Encounter for fitting and adjustment of insulin pump: Secondary | ICD-10-CM | POA: Diagnosis not present

## 2018-05-09 DIAGNOSIS — R69 Illness, unspecified: Secondary | ICD-10-CM | POA: Diagnosis not present

## 2018-05-09 DIAGNOSIS — N209 Urinary calculus, unspecified: Secondary | ICD-10-CM | POA: Diagnosis not present

## 2018-05-09 DIAGNOSIS — I2510 Atherosclerotic heart disease of native coronary artery without angina pectoris: Secondary | ICD-10-CM | POA: Diagnosis not present

## 2018-05-16 DIAGNOSIS — R69 Illness, unspecified: Secondary | ICD-10-CM | POA: Diagnosis not present

## 2018-05-23 DIAGNOSIS — I2510 Atherosclerotic heart disease of native coronary artery without angina pectoris: Secondary | ICD-10-CM | POA: Diagnosis not present

## 2018-05-23 DIAGNOSIS — E1165 Type 2 diabetes mellitus with hyperglycemia: Secondary | ICD-10-CM | POA: Diagnosis not present

## 2018-05-23 DIAGNOSIS — N209 Urinary calculus, unspecified: Secondary | ICD-10-CM | POA: Diagnosis not present

## 2018-05-23 DIAGNOSIS — I469 Cardiac arrest, cause unspecified: Secondary | ICD-10-CM | POA: Diagnosis not present

## 2018-05-23 DIAGNOSIS — E559 Vitamin D deficiency, unspecified: Secondary | ICD-10-CM | POA: Diagnosis not present

## 2018-05-23 DIAGNOSIS — R69 Illness, unspecified: Secondary | ICD-10-CM | POA: Diagnosis not present

## 2018-05-23 DIAGNOSIS — I10 Essential (primary) hypertension: Secondary | ICD-10-CM | POA: Diagnosis not present

## 2018-05-23 DIAGNOSIS — M810 Age-related osteoporosis without current pathological fracture: Secondary | ICD-10-CM | POA: Diagnosis not present

## 2018-05-23 DIAGNOSIS — N3001 Acute cystitis with hematuria: Secondary | ICD-10-CM | POA: Diagnosis not present

## 2018-05-23 DIAGNOSIS — Z9641 Presence of insulin pump (external) (internal): Secondary | ICD-10-CM | POA: Diagnosis not present

## 2018-06-12 ENCOUNTER — Telehealth: Payer: Self-pay | Admitting: *Deleted

## 2018-06-12 MED ORDER — POLYETHYLENE GLYCOL 3350 17 GM/SCOOP PO POWD: ORAL | 0 refills | Status: DC

## 2018-06-12 NOTE — Telephone Encounter (Signed)
Patient called the office back and wishes to schedule colonoscopy for 06-27-18 at [facility] with Dr. Bary Castilla.   She is aware to have COVID-19 testing on 06-21-18 and isolate after.   Patient aware colonoscopy instructions remain the same.   Medication list reviewed with the patient today.   The patient states she needs another prescription for Miralax sent in to [facility]. This was done as requested.   Patient aware to call the office if she has further questions.

## 2018-06-12 NOTE — Telephone Encounter (Signed)
I did speak with the patient today.   She was notified that we can get colonoscopy rescheduled with Dr. Bary Castilla at [facility].   Patient states she is not at home near her calendar and is requesting to call the office back to arrange a date.

## 2018-06-14 DIAGNOSIS — E119 Type 2 diabetes mellitus without complications: Secondary | ICD-10-CM | POA: Diagnosis not present

## 2018-06-21 ENCOUNTER — Other Ambulatory Visit: Payer: Self-pay

## 2018-06-21 ENCOUNTER — Other Ambulatory Visit
Admission: RE | Admit: 2018-06-21 | Discharge: 2018-06-21 | Disposition: A | Discharge status: Home / Self Care | Payer: Medicare HMO | Source: Ambulatory Visit | Attending: General Surgery | Admitting: General Surgery

## 2018-06-21 DIAGNOSIS — Z1159 Encounter for screening for other viral diseases: Secondary | ICD-10-CM | POA: Diagnosis not present

## 2018-06-22 LAB — NOVEL CORONAVIRUS, NAA (HOSP ORDER, SEND-OUT TO REF LAB; TAT 18-24 HRS): SARS-CoV-2, NAA: NOT DETECTED

## 2018-06-27 ENCOUNTER — Ambulatory Visit
Admission: RE | Admit: 2018-06-27 | Discharge: 2018-06-27 | Disposition: A | Discharge status: Home / Self Care | Payer: Medicare HMO | Attending: General Surgery | Admitting: General Surgery

## 2018-06-27 ENCOUNTER — Encounter
Admission: RE | Disposition: A | Discharge status: Home / Self Care | Payer: Self-pay | Source: Home / Self Care | Attending: General Surgery

## 2018-06-27 ENCOUNTER — Ambulatory Visit: Payer: Medicare HMO | Admitting: Certified Registered"

## 2018-06-27 ENCOUNTER — Other Ambulatory Visit: Payer: Self-pay

## 2018-06-27 ENCOUNTER — Encounter: Payer: Self-pay | Admitting: *Deleted

## 2018-06-27 DIAGNOSIS — Z87442 Personal history of urinary calculi: Secondary | ICD-10-CM | POA: Insufficient documentation

## 2018-06-27 DIAGNOSIS — K635 Polyp of colon: Secondary | ICD-10-CM | POA: Diagnosis not present

## 2018-06-27 DIAGNOSIS — I739 Peripheral vascular disease, unspecified: Secondary | ICD-10-CM | POA: Insufficient documentation

## 2018-06-27 DIAGNOSIS — Z1211 Encounter for screening for malignant neoplasm of colon: Secondary | ICD-10-CM | POA: Diagnosis not present

## 2018-06-27 DIAGNOSIS — J441 Chronic obstructive pulmonary disease with (acute) exacerbation: Secondary | ICD-10-CM | POA: Diagnosis not present

## 2018-06-27 DIAGNOSIS — E7800 Pure hypercholesterolemia, unspecified: Secondary | ICD-10-CM | POA: Diagnosis not present

## 2018-06-27 DIAGNOSIS — Z7982 Long term (current) use of aspirin: Secondary | ICD-10-CM | POA: Diagnosis not present

## 2018-06-27 DIAGNOSIS — Z87891 Personal history of nicotine dependence: Secondary | ICD-10-CM | POA: Diagnosis not present

## 2018-06-27 DIAGNOSIS — I2510 Atherosclerotic heart disease of native coronary artery without angina pectoris: Secondary | ICD-10-CM | POA: Insufficient documentation

## 2018-06-27 DIAGNOSIS — Z79899 Other long term (current) drug therapy: Secondary | ICD-10-CM | POA: Insufficient documentation

## 2018-06-27 DIAGNOSIS — I480 Paroxysmal atrial fibrillation: Secondary | ICD-10-CM | POA: Insufficient documentation

## 2018-06-27 DIAGNOSIS — Z955 Presence of coronary angioplasty implant and graft: Secondary | ICD-10-CM | POA: Diagnosis not present

## 2018-06-27 DIAGNOSIS — K5730 Diverticulosis of large intestine without perforation or abscess without bleeding: Secondary | ICD-10-CM | POA: Diagnosis not present

## 2018-06-27 DIAGNOSIS — K5790 Diverticulosis of intestine, part unspecified, without perforation or abscess without bleeding: Secondary | ICD-10-CM | POA: Diagnosis not present

## 2018-06-27 DIAGNOSIS — I252 Old myocardial infarction: Secondary | ICD-10-CM | POA: Insufficient documentation

## 2018-06-27 DIAGNOSIS — Z794 Long term (current) use of insulin: Secondary | ICD-10-CM | POA: Diagnosis not present

## 2018-06-27 DIAGNOSIS — K621 Rectal polyp: Secondary | ICD-10-CM | POA: Diagnosis not present

## 2018-06-27 DIAGNOSIS — D128 Benign neoplasm of rectum: Secondary | ICD-10-CM | POA: Diagnosis not present

## 2018-06-27 DIAGNOSIS — E119 Type 2 diabetes mellitus without complications: Secondary | ICD-10-CM | POA: Diagnosis not present

## 2018-06-27 HISTORY — PX: COLONOSCOPY WITH PROPOFOL: SHX5780

## 2018-06-27 LAB — GLUCOSE, CAPILLARY: Glucose-Capillary: 83 mg/dL (ref 70–99)

## 2018-06-27 SURGERY — COLONOSCOPY WITH PROPOFOL
Anesthesia: General

## 2018-06-27 MED ORDER — LIDOCAINE HCL (PF) 2 % IJ SOLN
INTRAMUSCULAR | Status: AC
  Filled 2018-06-27: qty 10

## 2018-06-27 MED ORDER — SODIUM CHLORIDE 0.9 % IV SOLN
INTRAVENOUS | Status: DC
  Administered 2018-06-27: 08:00:00 via INTRAVENOUS

## 2018-06-27 MED ORDER — MIDAZOLAM HCL 2 MG/2ML IJ SOLN
INTRAMUSCULAR | Status: AC
  Filled 2018-06-27: qty 2

## 2018-06-27 MED ORDER — PROPOFOL 500 MG/50ML IV EMUL
INTRAVENOUS | Status: DC | PRN
  Administered 2018-06-27: 120 ug/kg/min via INTRAVENOUS

## 2018-06-27 MED ORDER — PROPOFOL 10 MG/ML IV BOLUS
INTRAVENOUS | Status: DC | PRN
  Administered 2018-06-27: 30 mg via INTRAVENOUS
  Administered 2018-06-27: 70 mg via INTRAVENOUS

## 2018-06-27 MED ORDER — PROPOFOL 500 MG/50ML IV EMUL
INTRAVENOUS | Status: AC
  Filled 2018-06-27: qty 100

## 2018-06-27 MED ORDER — MIDAZOLAM HCL 5 MG/5ML IJ SOLN
INTRAMUSCULAR | Status: DC | PRN
  Administered 2018-06-27: 2 mg via INTRAVENOUS

## 2018-06-27 MED ORDER — LIDOCAINE 2% (20 MG/ML) 5 ML SYRINGE
INTRAMUSCULAR | Status: DC | PRN
  Administered 2018-06-27: 25 mg via INTRAVENOUS

## 2018-06-27 MED ORDER — GLYCOPYRROLATE 0.2 MG/ML IJ SOLN
INTRAMUSCULAR | Status: AC
  Filled 2018-06-27: qty 1

## 2018-06-27 NOTE — Op Note (Signed)
[facility] Patient Name: Lisa [name] Procedure Date: 06/27/2018 7:16 AM MRN: [id number] Account #: [id number] Date of Birth: 04-Sep-1950 Admit Type: Outpatient Age: 68 Room: [facility] ENDO ROOM 4 Gender: Female Note Status: Finalized Procedure:            Colonoscopy Indications:          Screening for colorectal malignant neoplasm Providers:            Robert Bellow, MD Referring MD:         Lenard Simmer, MD (Referring MD) Medicines:            Monitored Anesthesia Care Complications:        No immediate complications. Procedure:            Pre-Anesthesia Assessment:                       - Prior to the procedure, a History and Physical was                        performed, and patient medications, allergies and                        sensitivities were reviewed. The patient's tolerance of                        previous anesthesia was reviewed.                       - The risks and benefits of the procedure and the                        sedation options and risks were discussed with the                        patient. All questions were answered and informed                        consent was obtained.                       After obtaining informed consent, the colonoscope was                        passed under direct vision. Throughout the procedure,                        the patient's blood pressure, pulse, and oxygen                        saturations were monitored continuously. The                        Colonoscope was introduced through the anus and                        advanced to the the terminal ileum. The colonoscopy was                        performed without difficulty. The colonoscopy was  somewhat difficult due to a tortuous colon. Successful                        completion of the procedure was aided by using manual                        pressure. The patient tolerated the procedure well. The                         quality of the bowel preparation was good. Anal                        sphincter is somewhat lax. Findings:      A few large-mouthed diverticula were found in the sigmoid colon and       hepatic flexure.      A 7 mm polyp was found in the recto-sigmoid colon. The polyp was       sessile. Biopsies were taken with a cold forceps for histology.      A 8 mm polyp was found in the rectum. The polyp was sessile. Biopsies       were taken with a cold forceps for histology.      The retroflexed view of the distal rectum and anal verge was normal and       showed no anal or rectal abnormalities. Impression:           - Diverticulosis in the sigmoid colon and at the                        hepatic flexure.                       - One 7 mm polyp at the recto-sigmoid colon. Biopsied.                       - One 8 mm polyp in the rectum. Biopsied.                       - The distal rectum and anal verge are normal on                        retroflexion view. Recommendation:       - Telephone endoscopist for pathology results in 1 week. Procedure Code(s):    --- Professional ---                       [phone], Colonoscopy, flexible; with biopsy, single or                        multiple Diagnosis Code(s):    --- Professional ---                       Z12.11, Encounter for screening for malignant neoplasm                        of colon                       K63.5, Polyp of colon  K62.1, Rectal polyp                       K57.30, Diverticulosis of large intestine without                        perforation or abscess without bleeding CPT copyright 2019 American Medical Association. All rights reserved. The codes documented in this report are preliminary and upon coder review may  be revised to meet current compliance requirements. Robert Bellow, MD 06/27/2018 8:53:10 AM This report has been signed electronically. Number of Addenda: 0 Note Initiated On:  06/27/2018 7:16 AM Scope Withdrawal Time: 0 hours 18 minutes 36 seconds  Total Procedure Duration: 0 hours 38 minutes 35 seconds       [facility]

## 2018-06-27 NOTE — Anesthesia Post-op Follow-up Note (Signed)
Anesthesia QCDR form completed.        

## 2018-06-27 NOTE — Anesthesia Postprocedure Evaluation (Signed)
Anesthesia Post Note  Patient: Lisa [name]  Procedure(s) Performed: COLONOSCOPY WITH PROPOFOL (N/A )  Patient location during evaluation: Endoscopy Anesthesia Type: General Level of consciousness: awake and alert and oriented Pain management: pain level controlled Vital Signs Assessment: post-procedure vital signs reviewed and stable Respiratory status: spontaneous breathing, nonlabored ventilation and respiratory function stable Cardiovascular status: blood pressure returned to baseline and stable Postop Assessment: no signs of nausea or vomiting Anesthetic complications: no     Last Vitals:  Vitals:   06/27/18 0913 06/27/18 0923  BP: 125/72 116/69  Pulse:  63  Resp: 16 14  Temp: (!) 36.3 C   SpO2: 99% 100%    Last Pain:  Vitals:   06/27/18 0923  TempSrc:   PainSc: 0-No pain                 Elvan Ebron

## 2018-06-27 NOTE — Transfer of Care (Signed)
Immediate Anesthesia Transfer of Care Note  Patient: Lisa [name]  Procedure(s) Performed: COLONOSCOPY WITH PROPOFOL (N/A )  Patient Location: Endoscopy Unit  Anesthesia Type:General  Level of Consciousness: awake  Airway & Oxygen Therapy: Patient Spontanous Breathing and Patient connected to nasal cannula oxygen  Post-op Assessment: Report given to RN and Post -op Vital signs reviewed and stable  Post vital signs: Reviewed  Last Vitals:  Vitals Value Taken Time  BP 90/53 06/27/2018  8:53 AM  Temp 36.2 C 06/27/2018  8:53 AM  Pulse 63 06/27/2018  8:53 AM  Resp 15 06/27/2018  8:53 AM  SpO2 100 % 06/27/2018  8:53 AM  Vitals shown include unvalidated device data.  Last Pain:  Vitals:   06/27/18 0730  TempSrc: Tympanic         Complications: No apparent anesthesia complications

## 2018-06-27 NOTE — H&P (Signed)
Lisa [name] [id number] [birth year]     HPI:  68 y/o female for screening colonoscopy.  Has had less rectal seepage since March exam. Tolerated prep well.   Medications Prior to Admission  Medication Sig Dispense Refill Last Dose  . ALPRAZolam (XANAX) 0.25 MG tablet Take 0.25 mg by mouth 2 (two) times daily as needed (for anxiety.).    06/26/2018 at Unknown time  . aspirin EC 81 MG tablet Take 81 mg by mouth daily.   06/26/2018 at Unknown time  . atorvastatin (LIPITOR) 40 MG tablet Take 40 mg by mouth at bedtime.    Past Week at Unknown time  . diltiazem (CARDIZEM CD) 240 MG 24 hr capsule TAKE 1 CAPSULE BY MOUTH EVERY DAY 30 capsule 6 06/27/2018 at Unknown time  . insulin aspart (NOVOLOG) cartridge Inject into the skin 3 (three) times daily with meals.   06/27/2018 at Unknown time  . nitroGLYCERIN (NITROSTAT) 0.4 MG SL tablet Place 1 tablet (0.4 mg total) under the tongue every 5 (five) minutes x 3 doses as needed for chest pain. 25 tablet 6 Taking  . polyethylene glycol powder (GLYCOLAX/MIRALAX) 17 GM/SCOOP powder 255 grams one bottle for colonoscopy prep 255 g 0   . PROLIA 60 MG/ML SOSY injection every 6 (six) months.    Taking  . Vitamin D, Ergocalciferol, (DRISDOL) 50000 units CAPS capsule Take 50,000 Units by mouth every 30 (thirty) days.    Taking   Allergies  Allergen Reactions  . Ceftriaxone     Anaphylaxis, cardiac arrest  . Penicillins Anaphylaxis  . Rocephin [Ceftriaxone Sodium In Dextrose] Anaphylaxis  . Albuterol Other (See Comments)    Tachycardia-->Afib. Tolerates Xopenex   Past Medical History:  Diagnosis Date  . CAD (coronary artery disease)    a. 1999 PTCA @ [facility]; b. NSTEMI/Cath 10/11/2016: LM 20%, oLAD 40%, p-mLAD 40%, dLAD 40%, p-mLCx 95%, pRCA 30%, 95/77m, EF 55-65%; c. 10/2016 CT Surgery->rec PCI due to poor lung fxn. d. 12/02/16 DES to mid RCA.  . Groin hematoma    a. 10/2016 Spont groin hematoma w/ anemia req 1u prbcs.  . High cholesterol   . History of kidney  stones   . Mucus plugging of bronchi    a. 10/2016 RLL Bronchus obstruction->bronchus vs mass;  b. 10/2016 CT Chest: resolution of RLL mucous plug.  . NSTEMI (non-ST elevated myocardial infarction) ([place]) 10/11/2016  . PAF (paroxysmal atrial fibrillation) ([place])    a. 12/2016 Noted during diag cath--CHA2DS2VASc = 3. No OAC in setting of need for DAPT and h/o spont Thigh Hematoma in 10/2016.  Marland Kitchen Pericardial effusion    a. 10/2016 Echo: EF 60-65%, no rwma, Gr2 DD, small to mod pericardial eff. No tamponade; b. 12/2016 Echo: EF 60-65%, no rwma, Gr1 DD, 1.2 to 1.4 cm mod to large circumferential pericardial effusion w/o tamponade; c. 12/2016 Echo: EF 55-60%, no rwma, triv effusion.  . Pneumonia 02/2016   "couple times before 02/2016 too" (12/02/2016)  . PVD (peripheral vascular disease) ([place])    a. Abdominal aortogram 10/11/16: Infrarenal abdominal aorta heavily calcified & ectatic with approximately 40-50% stenosis at the bifurcation. Mild diffuse disease w/ heavy calcification noted in the common & external iliac arteries bilaterally  . Type I diabetes mellitus ([place])    a. On insulin pump.(12/02/2016)  . Ventricular fibrillation ([place])    a. 10/11/2016: Felt to be secondary to demand ischemia in the setting of underlying CAD and anaphylactic reaction from IV Rocephin; b. 10/2016 Echo: EF 55-60%, ? mild inf  HK; c. 10/2016 Seen by EP: No ICD indication.   Past Surgical History:  Procedure Laterality Date  . ABDOMINAL AORTOGRAM N/A 10/11/2016   Procedure: ABDOMINAL AORTOGRAM;  Surgeon: Nelva Bush, MD;  Location: [place] [facility];  Service: Cardiovascular;  Laterality: N/A;  . CARDIAC CATHETERIZATION  10/2016  . CERVICAL BIOPSY  W/ LOOP ELECTRODE EXCISION  03/05/2012  . COLONOSCOPY    . COLPOSCOPY  01/17/2011  . CORONARY ANGIOPLASTY WITH STENT PLACEMENT  1999; 12/02/2016   "1 stent  + 1 stent"  . CORONARY ATHERECTOMY N/A 12/02/2016   Procedure: CORONARY ATHERECTOMY - CSI;  Surgeon: Nelva Bush,  MD;  Location: [place] [facility];  Service: Cardiovascular;  Laterality: N/A;  . CORONARY STENT INTERVENTION N/A 12/02/2016   Procedure: CORONARY STENT INTERVENTION;  Surgeon: Nelva Bush, MD;  Location: [place] [facility];  Service: Cardiovascular;  Laterality: N/A;  . LEFT HEART CATH AND CORONARY ANGIOGRAPHY N/A 10/11/2016   Procedure: LEFT HEART CATH AND CORONARY ANGIOGRAPHY;  Surgeon: Nelva Bush, MD;  Location: [place] [facility];  Service: Cardiovascular;  Laterality: N/A;  . NASAL SINUS SURGERY  ~ 2009  . TEMPORARY PACEMAKER N/A 12/02/2016   Procedure: TEMPORARY PACEMAKER;  Surgeon: Nelva Bush, MD;  Location: [place] [facility];  Service: Cardiovascular;  Laterality: N/A;  . TONSILLECTOMY     Social History   Socioeconomic History  . Marital status: Married    Spouse name: Not on file  . Number of children: Not on file  . Years of education: Not on file  . Highest education level: Not on file  Occupational History  . Not on file  Social Needs  . Financial resource strain: Not on file  . Food insecurity:    Worry: Not on file    Inability: Not on file  . Transportation needs:    Medical: Not on file    Non-medical: Not on file  Tobacco Use  . Smoking status: Former Smoker    Packs/day: 0.25    Years: 47.00    Pack years: 11.75    Types: Cigarettes    Start date: 01/25/1969    Last attempt to quit: 10/11/2016    Years since quitting: 1.7  . Smokeless tobacco: Never Used  . Tobacco comment: 12/16/2016 Wants to quit, has not set quit date.  Has been informed about resources available for smoking cessation  Substance and Sexual Activity  . Alcohol use: No    Alcohol/week: 0.0 standard drinks    Frequency: Never  . Drug use: No  . Sexual activity: Yes    Birth control/protection: None  Lifestyle  . Physical activity:    Days per week: 3 days    Minutes per session: 40 min  . Stress: Not at all  Relationships  . Social connections:    Talks on  phone: More than three times a week    Gets together: Twice a week    Attends religious service: More than 4 times per year    Active member of club or organization: Yes    Attends meetings of clubs or organizations: More than 4 times per year    Relationship status: Married  . Intimate partner violence:    Fear of current or ex partner: No    Emotionally abused: No    Physically abused: No    Forced sexual activity: No  Other Topics Concern  . Not on file  Social History Narrative   [facility] (10/14/16):   Patient  has primarily done office work. Married for approximately 1 year. Has a dog at home but no bird exposure. No mold exposure.   Social History   Social History Narrative   [facility] (10/14/16):   Patient has primarily done office work. Married for approximately 1 year. Has a dog at home but no bird exposure. No mold exposure.     ROS: Negative.     PE: HEENT: Negative. Lungs: Clear. Cardio: RR.  Assessment/Plan:  Proceed with planned endoscopy.  Forest Gleason [facility] 06/27/2018

## 2018-06-27 NOTE — Anesthesia Preprocedure Evaluation (Signed)
Anesthesia Evaluation  Patient identified by MRN, date of birth, ID band Patient awake    Reviewed: Allergy & Precautions, NPO status , Patient's Chart, lab work & pertinent test results  History of Anesthesia Complications Negative for: history of anesthetic complications  Airway Mallampati: II  TM Distance: <3 FB Neck ROM: Full    Dental no notable dental hx.    Pulmonary neg sleep apnea, COPD, former smoker,    breath sounds clear to auscultation- rhonchi (-) wheezing      Cardiovascular + CAD, + Past MI and + Cardiac Stents (2018)   Rhythm:Regular Rate:Normal - Systolic murmurs and - Diastolic murmurs    Neuro/Psych neg Seizures negative neurological ROS  negative psych ROS   GI/Hepatic negative GI ROS, Neg liver ROS,   Endo/Other  diabetes, Insulin Dependent  Renal/GU Renal disease: hx of nephrolithiasis.     Musculoskeletal negative musculoskeletal ROS (+)   Abdominal (+) - obese,   Peds  Hematology negative hematology ROS (+)   Anesthesia Other Findings Past Medical History: No date: CAD (coronary artery disease)     Comment:  a. 1999 PTCA @ [facility]; b. NSTEMI/Cath 10/11/2016: LM 20%,               oLAD 40%, p-mLAD 40%, dLAD 40%, p-mLCx 95%, pRCA 30%,               95/37m, EF 55-65%; c. 10/2016 CT Surgery->rec PCI due to               poor lung fxn. d. 12/02/16 DES to mid RCA. No date: Groin hematoma     Comment:  a. 10/2016 Spont groin hematoma w/ anemia req 1u prbcs. No date: High cholesterol No date: History of kidney stones No date: Mucus plugging of bronchi     Comment:  a. 10/2016 RLL Bronchus obstruction->bronchus vs mass;                b. 10/2016 CT Chest: resolution of RLL mucous plug. 10/11/2016: NSTEMI (non-ST elevated myocardial infarction) ([place]) No date: PAF (paroxysmal atrial fibrillation) ([place])     Comment:  a. 12/2016 Noted during diag cath--CHA2DS2VASc = 3. No               OAC in setting of  need for DAPT and h/o spont Thigh               Hematoma in 10/2016. No date: Pericardial effusion     Comment:  a. 10/2016 Echo: EF 60-65%, no rwma, Gr2 DD, small to mod              pericardial eff. No tamponade; b. 12/2016 Echo: EF               60-65%, no rwma, Gr1 DD, 1.2 to 1.4 cm mod to large               circumferential pericardial effusion w/o tamponade; c.               12/2016 Echo: EF 55-60%, no rwma, triv effusion. 02/2016: Pneumonia     Comment:  "couple times before 02/2016 too" (12/02/2016) No date: PVD (peripheral vascular disease) ([place])     Comment:  a. Abdominal aortogram 10/11/16: Infrarenal abdominal               aorta heavily calcified & ectatic with approximately               40-50% stenosis at the  bifurcation. Mild diffuse disease               w/ heavy calcification noted in the common & external               iliac arteries bilaterally No date: Type I diabetes mellitus ([place])     Comment:  a. On insulin pump.(12/02/2016) No date: Ventricular fibrillation [facility])     Comment:  a. 10/11/2016: Felt to be secondary to demand ischemia in              the setting of underlying CAD and anaphylactic reaction               from IV Rocephin; b. 10/2016 Echo: EF 55-60%, ? mild inf               HK; c. 10/2016 Seen by EP: No ICD indication.   Reproductive/Obstetrics                             Anesthesia Physical Anesthesia Plan  ASA: III  Anesthesia Plan: General   Post-op Pain Management:    Induction: Intravenous  PONV Risk Score and Plan: 2 and Propofol infusion  Airway Management Planned: Natural Airway  Additional Equipment:   Intra-op Plan:   Post-operative Plan:   Informed Consent: I have reviewed the patients History and Physical, chart, labs and discussed the procedure including the risks, benefits and alternatives for the proposed anesthesia with the patient or authorized representative who has indicated his/her understanding and  acceptance.     Dental advisory given  Plan Discussed with: CRNA and Anesthesiologist  Anesthesia Plan Comments:         Anesthesia Quick Evaluation

## 2018-06-28 LAB — SURGICAL PATHOLOGY

## 2018-07-02 ENCOUNTER — Telehealth: Payer: Self-pay | Admitting: *Deleted

## 2018-07-02 NOTE — Telephone Encounter (Signed)
Patient called and stated that she talked with Dr.Byrnett on Friday and he told her to call here at the office, Dr.Byrnett needs remove more on her polyp. Please call and advise.

## 2018-07-05 ENCOUNTER — Ambulatory Visit (INDEPENDENT_AMBULATORY_CARE_PROVIDER_SITE_OTHER): Payer: Medicare HMO | Admitting: General Surgery

## 2018-07-05 ENCOUNTER — Other Ambulatory Visit: Payer: Self-pay

## 2018-07-05 ENCOUNTER — Encounter: Payer: Self-pay | Admitting: General Surgery

## 2018-07-05 VITALS — BP 128/67 | HR 94 | Temp 97.5°F | Resp 16 | Ht 63.0 in | Wt 137.4 lb

## 2018-07-05 DIAGNOSIS — K621 Rectal polyp: Secondary | ICD-10-CM

## 2018-07-05 NOTE — Patient Instructions (Signed)
The patient is aware to call back for any questions or new concerns.  

## 2018-07-05 NOTE — Progress Notes (Signed)
Patient ID: Lisa [name], female   DOB: Aug 15, 1950, 68 y.o.   MRN: [id number]  Chief Complaint  Patient presents with  . Follow-up    anoscopy-removal of rectal polyp    HPI Lisa [name] is a 68 y.o. female.  Here today for removal of rectal polyp.  The patient had undergone a colonoscopy on Jun 27, 2018 was found to have a small tubulovillous adenoma in the lower rectum.  This was only biopsied.  She returns today for complete excision.  The patient reports tolerating the colonoscopy well. HPI  Past Medical History:  Diagnosis Date  . CAD (coronary artery disease)    a. 1999 PTCA @ [facility]; b. NSTEMI/Cath 10/11/2016: LM 20%, oLAD 40%, p-mLAD 40%, dLAD 40%, p-mLCx 95%, pRCA 30%, 95/5m, EF 55-65%; c. 10/2016 CT Surgery->rec PCI due to poor lung fxn. d. 12/02/16 DES to mid RCA.  . Groin hematoma    a. 10/2016 Spont groin hematoma w/ anemia req 1u prbcs.  . High cholesterol   . History of kidney stones   . Mucus plugging of bronchi    a. 10/2016 RLL Bronchus obstruction->bronchus vs mass;  b. 10/2016 CT Chest: resolution of RLL mucous plug.  . NSTEMI (non-ST elevated myocardial infarction) ([place]) 10/11/2016  . PAF (paroxysmal atrial fibrillation) ([place])    a. 12/2016 Noted during diag cath--CHA2DS2VASc = 3. No OAC in setting of need for DAPT and h/o spont Thigh Hematoma in 10/2016.  Marland Kitchen Pericardial effusion    a. 10/2016 Echo: EF 60-65%, no rwma, Gr2 DD, small to mod pericardial eff. No tamponade; b. 12/2016 Echo: EF 60-65%, no rwma, Gr1 DD, 1.2 to 1.4 cm mod to large circumferential pericardial effusion w/o tamponade; c. 12/2016 Echo: EF 55-60%, no rwma, triv effusion.  . Pneumonia 02/2016   "couple times before 02/2016 too" (12/02/2016)  . PVD (peripheral vascular disease) ([place])    a. Abdominal aortogram 10/11/16: Infrarenal abdominal aorta heavily calcified & ectatic with approximately 40-50% stenosis at the bifurcation. Mild diffuse disease w/ heavy calcification noted in the common & external  iliac arteries bilaterally  . Type I diabetes mellitus ([place])    a. On insulin pump.(12/02/2016)  . Ventricular fibrillation ([place])    a. 10/11/2016: Felt to be secondary to demand ischemia in the setting of underlying CAD and anaphylactic reaction from IV Rocephin; b. 10/2016 Echo: EF 55-60%, ? mild inf HK; c. 10/2016 Seen by EP: No ICD indication.    Past Surgical History:  Procedure Laterality Date  . ABDOMINAL AORTOGRAM N/A 10/11/2016   Procedure: ABDOMINAL AORTOGRAM;  Surgeon: Nelva Bush, MD;  Location: [facility];  Service: Cardiovascular;  Laterality: N/A;  . CARDIAC CATHETERIZATION  10/2016  . CERVICAL BIOPSY  W/ LOOP ELECTRODE EXCISION  03/05/2012  . COLONOSCOPY    . COLONOSCOPY WITH PROPOFOL N/A 06/27/2018   Procedure: COLONOSCOPY WITH PROPOFOL;  Surgeon: Robert Bellow, MD;  Location: [facility];  Service: Endoscopy;  Laterality: N/A;  . COLPOSCOPY  01/17/2011  . CORONARY ANGIOPLASTY WITH STENT PLACEMENT  1999; 12/02/2016   "1 stent  + 1 stent"  . CORONARY ATHERECTOMY N/A 12/02/2016   Procedure: CORONARY ATHERECTOMY - CSI;  Surgeon: Nelva Bush, MD;  Location: [facility];  Service: Cardiovascular;  Laterality: N/A;  . CORONARY STENT INTERVENTION N/A 12/02/2016   Procedure: CORONARY STENT INTERVENTION;  Surgeon: Nelva Bush, MD;  Location: [facility];  Service: Cardiovascular;  Laterality: N/A;  . LEFT HEART CATH AND CORONARY ANGIOGRAPHY N/A 10/11/2016  Procedure: LEFT HEART CATH AND CORONARY ANGIOGRAPHY;  Surgeon: Nelva Bush, MD;  Location: [facility];  Service: Cardiovascular;  Laterality: N/A;  . NASAL SINUS SURGERY  ~ 2009  . TEMPORARY PACEMAKER N/A 12/02/2016   Procedure: TEMPORARY PACEMAKER;  Surgeon: Nelva Bush, MD;  Location: [facility];  Service: Cardiovascular;  Laterality: N/A;  . TONSILLECTOMY      Family History  Problem Relation Age of Onset  . Hypertension Mother   . Aortic aneurysm Father    . Hypertension Brother   . Lung disease Neg Hx   . Rheumatologic disease Neg Hx     Social History Social History   Tobacco Use  . Smoking status: Former Smoker    Packs/day: 0.25    Years: 47.00    Pack years: 11.75    Types: Cigarettes    Start date: 01/25/1969    Last attempt to quit: 10/11/2016    Years since quitting: 1.7  . Smokeless tobacco: Never Used  . Tobacco comment: 12/16/2016 Wants to quit, has not set quit date.  Has been informed about resources available for smoking cessation  Substance Use Topics  . Alcohol use: No    Alcohol/week: 0.0 standard drinks    Frequency: Never  . Drug use: No    Allergies  Allergen Reactions  . Ceftriaxone     Anaphylaxis, cardiac arrest  . Penicillins Anaphylaxis  . Rocephin [Ceftriaxone Sodium In Dextrose] Anaphylaxis  . Albuterol Other (See Comments)    Tachycardia-->Afib. Tolerates Xopenex    Current Outpatient Medications  Medication Sig Dispense Refill  . ALPRAZolam (XANAX) 0.25 MG tablet Take 0.25 mg by mouth 2 (two) times daily as needed (for anxiety.).     Marland Kitchen aspirin EC 81 MG tablet Take 81 mg by mouth daily.    Marland Kitchen atorvastatin (LIPITOR) 40 MG tablet Take 40 mg by mouth at bedtime.     Marland Kitchen diltiazem (CARDIZEM CD) 240 MG 24 hr capsule TAKE 1 CAPSULE BY MOUTH EVERY DAY 30 capsule 6  . insulin aspart (NOVOLOG) cartridge Inject into the skin 3 (three) times daily with meals.    . nitroGLYCERIN (NITROSTAT) 0.4 MG SL tablet Place 1 tablet (0.4 mg total) under the tongue every 5 (five) minutes x 3 doses as needed for chest pain. 25 tablet 6  . PROLIA 60 MG/ML SOSY injection every 6 (six) months.     . Vitamin D, Ergocalciferol, (DRISDOL) 50000 units CAPS capsule Take 50,000 Units by mouth every 30 (thirty) days.      No current facility-administered medications for this visit.     Review of Systems Review of Systems  Constitutional: Negative.   Respiratory: Negative.   Cardiovascular: Negative.     Blood pressure  128/67, pulse 94, temperature (!) 97.5 F (36.4 C), temperature source Temporal, resp. rate 16, height 5\' 3"  (1.6 m), weight 137 lb 6.4 oz (62.3 kg), SpO2 98 %.  Physical Exam Physical Exam Constitutional:      Appearance: Normal appearance.  Eyes:     General: No scleral icterus. Skin:    General: Skin is warm and dry.  Neurological:     Mental Status: She is alert and oriented to person, place, and time.  Psychiatric:        Behavior: Behavior normal.   External anal exam was unremarkable.  Digital exam was negative.  Anoscopy showed the area of the previously biopsied polyp on the right lateral wall.  This was removed with a single rasp  of the biopsy forcep and sent in formalin for routine histology.  About 5 cc of bleeding stopped spontaneously.  Data Reviewed Colonoscopy results:  A. RECTAL POLYP, UPPER, 20 CM; COLD BIOPSY:  - HYPERPLASTIC POLYP.  - NEGATIVE FOR DYSPLASIA AND MALIGNANCY.   B. RECTAL POLYP LOWER; COLD BIOPSY:  - FRAGMENTS OF TUBULOVILLOUS ADENOMA.  - NEGATIVE FOR HIGH-GRADE DYSPLASIA AND MALIGNANCY.   Assessment Tubulovillous adenoma of the rectum.  Plan  Follow-up based on final pathology.  Likely will plan for a follow-up anoscopy in 1 year.  HPI, assessment, plan and physical exam has been scribed under the direction and in the presence of Robert Bellow, MD. Karie Fetch, RN  HPI, Physical Exam, Assessment and Plan have been scribed under the direction and in the presence of Robert Bellow, MD. Jonnie Finner, CMA  I have completed the exam and reviewed the above documentation for accuracy and completeness.  I agree with the above.  Haematologist has been used and any errors in dictation or transcription are unintentional.  Hervey Ard, M.D., F.A.C.S.  Forest Gleason Kelvon Giannini 07/06/2018, 6:02 AM

## 2018-07-06 ENCOUNTER — Encounter: Payer: Self-pay | Admitting: General Surgery

## 2018-07-06 DIAGNOSIS — K621 Rectal polyp: Secondary | ICD-10-CM | POA: Insufficient documentation

## 2018-07-09 ENCOUNTER — Telehealth: Payer: Self-pay

## 2018-07-09 NOTE — Telephone Encounter (Signed)
Notified patient as instructed, patient pleased. Discussed follow-up appointments, patient agrees     Please let the patient know the additional polyp tissue was fine. Would like a repeat exam (anoscopy) in one year. Thanks.

## 2018-07-10 DIAGNOSIS — C44319 Basal cell carcinoma of skin of other parts of face: Secondary | ICD-10-CM | POA: Diagnosis not present

## 2018-07-10 DIAGNOSIS — D485 Neoplasm of uncertain behavior of skin: Secondary | ICD-10-CM | POA: Diagnosis not present

## 2018-07-10 DIAGNOSIS — R208 Other disturbances of skin sensation: Secondary | ICD-10-CM | POA: Diagnosis not present

## 2018-07-10 DIAGNOSIS — C44629 Squamous cell carcinoma of skin of left upper limb, including shoulder: Secondary | ICD-10-CM | POA: Diagnosis not present

## 2018-07-11 DIAGNOSIS — R69 Illness, unspecified: Secondary | ICD-10-CM | POA: Diagnosis not present

## 2018-07-16 ENCOUNTER — Telehealth: Payer: Self-pay | Admitting: Internal Medicine

## 2018-07-16 ENCOUNTER — Other Ambulatory Visit: Payer: Self-pay

## 2018-07-16 MED ORDER — DILTIAZEM HCL ER COATED BEADS 240 MG PO CP24: ORAL_CAPSULE | ORAL | 0 refills | Status: DC

## 2018-07-16 NOTE — Telephone Encounter (Signed)
°*  STAT* If patient is at the pharmacy, call can be transferred to refill team.   1. Which medications need to be refilled? (please list name of each medication and dose if known)     diltiazem 240 mg po q d   2. Which pharmacy/location (including street and city if local pharmacy) is medication to be sent to?   [facility] [id number] [place]   3. Do they need a 30 day or 90 day supply? [place]

## 2018-07-16 NOTE — Telephone Encounter (Signed)
diltiazem (CARDIZEM CD) 240 MG 24 hr capsule 90 capsule 0 07/16/2018    Sig: TAKE 1 CAPSULE BY MOUTH EVERY DAY   Sent to pharmacy as: diltiazem (CARDIZEM CD) 240 MG 24 hr capsule   Notes to Pharmacy: Patient needs an appointment for further refills, Thanks ! [phone]   E-Prescribing Status: Receipt confirmed by pharmacy (07/16/2018 10:10 AM EDT)   Pharmacy  [facility], [place]

## 2018-07-17 DIAGNOSIS — D0462 Carcinoma in situ of skin of left upper limb, including shoulder: Secondary | ICD-10-CM | POA: Diagnosis not present

## 2018-07-17 DIAGNOSIS — C44629 Squamous cell carcinoma of skin of left upper limb, including shoulder: Secondary | ICD-10-CM | POA: Diagnosis not present

## 2018-08-01 DIAGNOSIS — C44519 Basal cell carcinoma of skin of other part of trunk: Secondary | ICD-10-CM | POA: Diagnosis not present

## 2018-08-23 DIAGNOSIS — I2510 Atherosclerotic heart disease of native coronary artery without angina pectoris: Secondary | ICD-10-CM | POA: Diagnosis not present

## 2018-08-23 DIAGNOSIS — N209 Urinary calculus, unspecified: Secondary | ICD-10-CM | POA: Diagnosis not present

## 2018-08-23 DIAGNOSIS — I340 Nonrheumatic mitral (valve) insufficiency: Secondary | ICD-10-CM | POA: Diagnosis not present

## 2018-08-23 DIAGNOSIS — E1165 Type 2 diabetes mellitus with hyperglycemia: Secondary | ICD-10-CM | POA: Diagnosis not present

## 2018-08-23 DIAGNOSIS — E559 Vitamin D deficiency, unspecified: Secondary | ICD-10-CM | POA: Diagnosis not present

## 2018-08-23 DIAGNOSIS — E118 Type 2 diabetes mellitus with unspecified complications: Secondary | ICD-10-CM | POA: Diagnosis not present

## 2018-08-23 DIAGNOSIS — I10 Essential (primary) hypertension: Secondary | ICD-10-CM | POA: Diagnosis not present

## 2018-08-23 DIAGNOSIS — K219 Gastro-esophageal reflux disease without esophagitis: Secondary | ICD-10-CM | POA: Diagnosis not present

## 2018-08-29 DIAGNOSIS — E1165 Type 2 diabetes mellitus with hyperglycemia: Secondary | ICD-10-CM | POA: Diagnosis not present

## 2018-08-29 DIAGNOSIS — R69 Illness, unspecified: Secondary | ICD-10-CM | POA: Diagnosis not present

## 2018-08-29 DIAGNOSIS — M810 Age-related osteoporosis without current pathological fracture: Secondary | ICD-10-CM | POA: Diagnosis not present

## 2018-08-29 DIAGNOSIS — F418 Other specified anxiety disorders: Secondary | ICD-10-CM | POA: Diagnosis not present

## 2018-08-29 DIAGNOSIS — I10 Essential (primary) hypertension: Secondary | ICD-10-CM | POA: Diagnosis not present

## 2018-08-29 DIAGNOSIS — E7800 Pure hypercholesterolemia, unspecified: Secondary | ICD-10-CM | POA: Diagnosis not present

## 2018-08-29 DIAGNOSIS — E559 Vitamin D deficiency, unspecified: Secondary | ICD-10-CM | POA: Diagnosis not present

## 2018-08-29 DIAGNOSIS — I340 Nonrheumatic mitral (valve) insufficiency: Secondary | ICD-10-CM | POA: Diagnosis not present

## 2018-08-29 DIAGNOSIS — I2510 Atherosclerotic heart disease of native coronary artery without angina pectoris: Secondary | ICD-10-CM | POA: Diagnosis not present

## 2018-08-29 DIAGNOSIS — Z4681 Encounter for fitting and adjustment of insulin pump: Secondary | ICD-10-CM | POA: Diagnosis not present

## 2018-08-29 DIAGNOSIS — I469 Cardiac arrest, cause unspecified: Secondary | ICD-10-CM | POA: Diagnosis not present

## 2018-08-31 ENCOUNTER — Encounter: Payer: Self-pay | Admitting: General Surgery

## 2018-09-05 DIAGNOSIS — J45998 Other asthma: Secondary | ICD-10-CM | POA: Diagnosis not present

## 2018-09-05 DIAGNOSIS — I469 Cardiac arrest, cause unspecified: Secondary | ICD-10-CM | POA: Diagnosis not present

## 2018-09-05 DIAGNOSIS — E1165 Type 2 diabetes mellitus with hyperglycemia: Secondary | ICD-10-CM | POA: Diagnosis not present

## 2018-09-05 DIAGNOSIS — N209 Urinary calculus, unspecified: Secondary | ICD-10-CM | POA: Diagnosis not present

## 2018-09-05 DIAGNOSIS — I10 Essential (primary) hypertension: Secondary | ICD-10-CM | POA: Diagnosis not present

## 2018-09-05 DIAGNOSIS — I2510 Atherosclerotic heart disease of native coronary artery without angina pectoris: Secondary | ICD-10-CM | POA: Diagnosis not present

## 2018-09-05 DIAGNOSIS — E559 Vitamin D deficiency, unspecified: Secondary | ICD-10-CM | POA: Diagnosis not present

## 2018-09-05 DIAGNOSIS — I340 Nonrheumatic mitral (valve) insufficiency: Secondary | ICD-10-CM | POA: Diagnosis not present

## 2018-09-05 DIAGNOSIS — R69 Illness, unspecified: Secondary | ICD-10-CM | POA: Diagnosis not present

## 2018-09-05 DIAGNOSIS — E7800 Pure hypercholesterolemia, unspecified: Secondary | ICD-10-CM | POA: Diagnosis not present

## 2018-09-10 DIAGNOSIS — E109 Type 1 diabetes mellitus without complications: Secondary | ICD-10-CM | POA: Diagnosis not present

## 2018-10-02 DIAGNOSIS — D2261 Melanocytic nevi of right upper limb, including shoulder: Secondary | ICD-10-CM | POA: Diagnosis not present

## 2018-10-02 DIAGNOSIS — D2262 Melanocytic nevi of left upper limb, including shoulder: Secondary | ICD-10-CM | POA: Diagnosis not present

## 2018-10-02 DIAGNOSIS — D2272 Melanocytic nevi of left lower limb, including hip: Secondary | ICD-10-CM | POA: Diagnosis not present

## 2018-10-02 DIAGNOSIS — X32XXXA Exposure to sunlight, initial encounter: Secondary | ICD-10-CM | POA: Diagnosis not present

## 2018-10-02 DIAGNOSIS — Z85828 Personal history of other malignant neoplasm of skin: Secondary | ICD-10-CM | POA: Diagnosis not present

## 2018-10-02 DIAGNOSIS — C44519 Basal cell carcinoma of skin of other part of trunk: Secondary | ICD-10-CM | POA: Diagnosis not present

## 2018-10-02 DIAGNOSIS — D225 Melanocytic nevi of trunk: Secondary | ICD-10-CM | POA: Diagnosis not present

## 2018-10-02 DIAGNOSIS — D485 Neoplasm of uncertain behavior of skin: Secondary | ICD-10-CM | POA: Diagnosis not present

## 2018-10-02 DIAGNOSIS — L570 Actinic keratosis: Secondary | ICD-10-CM | POA: Diagnosis not present

## 2018-10-09 DIAGNOSIS — C44519 Basal cell carcinoma of skin of other part of trunk: Secondary | ICD-10-CM | POA: Diagnosis not present

## 2018-10-15 ENCOUNTER — Other Ambulatory Visit: Payer: Self-pay | Admitting: Internal Medicine

## 2018-10-15 MED ORDER — DILTIAZEM HCL ER COATED BEADS 240 MG PO CP24: ORAL_CAPSULE | ORAL | 0 refills | Status: DC

## 2018-10-15 NOTE — Telephone Encounter (Signed)
°*  STAT* If patient is at the pharmacy, call can be transferred to refill team.   1. Which medications need to be refilled? (please list name of each medication and dose if known) diltiazem 240 MG 1 capsule daily   2. Which pharmacy/location (including street and city if local pharmacy) is medication to be sent to? [facility] on [address]  3. Do they need a 30 day or 90 day supply? 90 day

## 2018-10-17 ENCOUNTER — Other Ambulatory Visit: Payer: Self-pay

## 2018-10-17 ENCOUNTER — Encounter: Payer: Self-pay | Admitting: Internal Medicine

## 2018-10-17 ENCOUNTER — Ambulatory Visit: Payer: Medicare HMO | Admitting: Internal Medicine

## 2018-10-17 VITALS — BP 128/70 | HR 82 | Ht 63.0 in | Wt 140.0 lb

## 2018-10-17 DIAGNOSIS — I3139 Other pericardial effusion (noninflammatory): Secondary | ICD-10-CM

## 2018-10-17 DIAGNOSIS — I480 Paroxysmal atrial fibrillation: Secondary | ICD-10-CM

## 2018-10-17 DIAGNOSIS — I2510 Atherosclerotic heart disease of native coronary artery without angina pectoris: Secondary | ICD-10-CM

## 2018-10-17 DIAGNOSIS — I313 Pericardial effusion (noninflammatory): Secondary | ICD-10-CM

## 2018-10-17 DIAGNOSIS — E1059 Type 1 diabetes mellitus with other circulatory complications: Secondary | ICD-10-CM

## 2018-10-17 DIAGNOSIS — E785 Hyperlipidemia, unspecified: Secondary | ICD-10-CM | POA: Diagnosis not present

## 2018-10-17 NOTE — Patient Instructions (Signed)
Medication Instructions:  Your physician recommends that you continue on your current medications as directed. Please refer to the Current Medication list given to you today.  If you need a refill on your cardiac medications before your next appointment, please call your pharmacy.   Lab work: None ordered If you have labs (blood work) drawn today and your tests are completely normal, you will receive your results only by: Marland Kitchen MyChart Message (if you have MyChart) OR . A paper copy in the mail If you have any lab test that is abnormal or we need to change your treatment, we will call you to review the results.  Testing/Procedures: None ordered  Follow-Up: At [facility], you and your health needs are our priority.  As part of our continuing mission to provide you with exceptional heart care, we have created designated Provider Care Teams.  These Care Teams include your primary Cardiologist (physician) and Advanced Practice Providers (APPs -  Physician Assistants and Nurse Practitioners) who all work together to provide you with the care you need, when you need it. You will need a follow up appointment in 6 months.  Please call our office 2 months in advance to schedule this appointment.  You may see Nelva Bush, MD or one of the following Advanced Practice Providers on your designated Care Team:   Murray Hodgkins, NP Christell Faith, PA-C . Marrianne Mood, PA-C  Any Other Special Instructions Will Be Listed Below (If Applicable). N/A

## 2018-10-17 NOTE — Progress Notes (Signed)
Follow-up Outpatient Visit Date: 10/17/2018  Primary Care Provider: Lenard Simmer, MD [place] [id number]  Chief Complaint: Follow-up coronary artery disease  HPI:  Lisa [name] is a 68 y.o. year-old female with history of coronary artery disease status post remote coronary intervention at [place] (the late 90s) and cardiac arrest in the setting of anaphylaxis with catheterization demonstrating severe 2 vessel CAD(subsequent atherectomy and PCI to RCA),recurrent pericardial effusion, type1 diabetes mellitus, peripheral vascular disease with AAA, right lower lobe pneumonia with mucous plugging, and spontaneous right thigh hematoma in the setting of anticoagulation, who presents for follow-up of coronary artery disease.  I last saw Lisa [name] in March, at which time she was feeling well without chest pain or shortness of breath.  She was exercising regularly at home without limitations.    Today, Lisa [name] reports that she is feeling well.  She notes decreased activity during the COVID-19 pandemic and associated 10 pound weight gain.  She is trying to walk some.  She denies chest pain, shortness of breath, palpitations, lightheadedness, and edema.  Home blood pressures are typically 120-130/60-70.  She remains on single antiplatelet therapy with aspirin due to history of hematuria in the setting of ureteral stone.  She has not had any further hematuria or flank pain.  --------------------------------------------------------------------------------------------------  Cardiovascular History & Procedures: Cardiovascular Problems:  Coronary artery disease with VF arrest and transient complete heart block in the setting of anaphylactic shock  Recurrent pericardial effusion  Paroxysmal atrial fibrillation  Risk Factors:  Known coronary artery disease, peripheral vascular disease, and type 1 diabetes mellitus  Cath/PCI:  PCI (12/02/16): Successful orbital atherectomy and  PCI to heavily calcified mid RCA disease with placement of a Xience Alpine 3.0 x 33 mm drug-eluting stent.  LHC (10/11/16): Severe two-vessel coronary artery disease including heavily calcified 95% proximal/mid LCx and sequential 90-99% mid RCA stenoses. Mild to moderate, nonobstructive disease involving the LMCA and LAD. Basal inferior hypokinesis with overall preserved LV contraction. Mildly elevated LVEDP. Peripheral vascular disease with ectasia and calcification of the infrarenal abdominal aorta and iliac arteries.  CV Surgery:  None  EP Procedures and Devices:  None  Non-Invasive Evaluation(s):  Limited TTE (01/19/17): Trivial pericardial effusion, significantly decreased in size from prior study. LVEF 55-60%.  Limited TTE (12/13/16): Normal LV size with LVEF of 65-70%. Normal RV size and function. Moderate to large circumferential pericardial effusion, stable to slightly decreased in size compared with 12/09/16. No evidence of tamponadephysiology.  TTE (10/25/16): Normal LV size with mild to moderate LVH. LVEF 60-65% with grade 2 diastolic dysfunction. RV size and function. Normal PA pressure. Small-to-moderate pericardial effusion.  TTE (10/12/16): Normal LV size. LVEF 55-60%. Normal diastolic function. Normal RV size and function. Normal PA pressure.  Recent CV Pertinent Labs: Lab Results  Component Value Date   CHOL 81 10/13/2016   HDL 34 (L) 10/13/2016   LDLCALC 36 10/13/2016   TRIG 56 10/13/2016   CHOLHDL 2.4 10/13/2016   INR 0.94 11/23/2016   BNP 69.0 10/25/2016   K 4.2 05/13/2017   MG 1.6 (L) 12/08/2016   BUN 17 05/13/2017   CREATININE 0.85 05/13/2017    Past medical and surgical history were reviewed and updated in EPIC.  Current Meds  Medication Sig  . ALPRAZolam (XANAX) 0.25 MG tablet Take 0.25 mg by mouth 2 (two) times daily as needed (for anxiety.).   Marland Kitchen aspirin EC 81 MG tablet Take 81 mg by mouth daily.  Marland Kitchen atorvastatin (LIPITOR) 40  MG tablet Take 40  mg by mouth at bedtime.   Marland Kitchen diltiazem (CARDIZEM CD) 240 MG 24 hr capsule TAKE 1 CAPSULE BY MOUTH EVERY DAY  . insulin aspart (NOVOLOG) cartridge Inject into the skin 3 (three) times daily with meals.  . nitroGLYCERIN (NITROSTAT) 0.4 MG SL tablet Place 1 tablet (0.4 mg total) under the tongue every 5 (five) minutes x 3 doses as needed for chest pain.  Marland Kitchen PROLIA 60 MG/ML SOSY injection every 6 (six) months.   . Vitamin D, Ergocalciferol, (DRISDOL) 50000 units CAPS capsule Take 50,000 Units by mouth every 30 (thirty) days.     Allergies: Ceftriaxone, Penicillins, Rocephin [ceftriaxone sodium in dextrose], and Albuterol  Social History   Tobacco Use  . Smoking status: Former Smoker    Packs/day: 0.25    Years: 47.00    Pack years: 11.75    Types: Cigarettes    Start date: 01/25/1969    Quit date: 10/11/2016    Years since quitting: 2.0  . Smokeless tobacco: Never Used  . Tobacco comment: 12/16/2016 Wants to quit, has not set quit date.  Has been informed about resources available for smoking cessation  Substance Use Topics  . Alcohol use: No    Alcohol/week: 0.0 standard drinks    Frequency: Never  . Drug use: No    Family History  Problem Relation Age of Onset  . Hypertension Mother   . Aortic aneurysm Father   . Hypertension Brother   . Lung disease Neg Hx   . Rheumatologic disease Neg Hx     Review of Systems: A 12-system review of systems was performed and was negative except as noted in the HPI.  --------------------------------------------------------------------------------------------------  Physical Exam: BP 128/70 (BP Location: Left Arm, Patient Position: Sitting, Cuff Size: Normal)   Pulse 82   Ht 5\' 3"  (1.6 m)   Wt 140 lb (63.5 kg)   SpO2 98%   BMI 24.80 kg/m   General: NAD. HEENT: No conjunctival pallor or scleral icterus.  Facemask in place. Neck: Supple without lymphadenopathy, thyromegaly, JVD, or HJR. Lungs: Normal work of breathing. Clear to  auscultation bilaterally without wheezes or crackles. Heart: Regular rate and rhythm without murmurs, rubs, or gallops. Non-displaced PMI. Abd: Bowel sounds present. Soft, NT/ND without hepatosplenomegaly Ext: No lower extremity edema. Radial, PT, and DP pulses are 2+ bilaterally. Skin: Warm and dry without rash.  EKG: Normal sinus rhythm without abnormality.  Lab Results  Component Value Date   WBC 6.9 05/13/2017   HGB 12.1 05/13/2017   HCT 36.8 05/13/2017   MCV 82.2 05/13/2017   PLT 265 05/13/2017    Lab Results  Component Value Date   NA 131 (L) 05/13/2017   K 4.2 05/13/2017   CL 100 (L) 05/13/2017   CO2 26 05/13/2017   BUN 17 05/13/2017   CREATININE 0.85 05/13/2017   GLUCOSE 94 05/13/2017   ALT 20 05/07/2017    Lab Results  Component Value Date   CHOL 81 10/13/2016   HDL 34 (L) 10/13/2016   LDLCALC 36 10/13/2016   TRIG 56 10/13/2016   CHOLHDL 2.4 10/13/2016    --------------------------------------------------------------------------------------------------  ASSESSMENT AND PLAN: Coronary artery disease: Lisa [name] continues to do well despite significant multivessel CAD.  We were able to successfully revascularize her heavily calcified RCA almost 2 years ago.  Given the lack of symptoms and unfavorable anatomy, we will continue with medical therapy of LCx disease.  We discussed the risks and benefits of a functional study but  have agreed to defer.  We will continue current medications for secondary prevention, including aspirin and rosuvastatin.  History of pericardial effusion: No signs or symptoms to suggest recurrence, which was likely due to microperforation at the time of PCI.  No further work-up at this time.  Paroxysmal atrial fibrillation: No signs or symptoms to suggest recurrence; only documented episode occurred in the setting of acute illness and was complicated by spontaneous thigh hematoma while on heparin.  We will defer anticoagulation.   Hyperlipidemia: LDL at goal on last check by Dr. Ronnald Collum.  Continue atorvastatin 40 mg daily.  Type 1 diabetes mellitus: Lisa [name] continues to use her insulin pump with close follow-up by Dr. Ronnald Collum.  No intervention at this time.  Follow-up: Return to clinic in 6 months.  Nelva Bush, MD 10/17/2018 12:02 PM

## 2018-10-26 DIAGNOSIS — I469 Cardiac arrest, cause unspecified: Secondary | ICD-10-CM | POA: Diagnosis not present

## 2018-10-26 DIAGNOSIS — J45998 Other asthma: Secondary | ICD-10-CM | POA: Diagnosis not present

## 2018-10-26 DIAGNOSIS — Z1211 Encounter for screening for malignant neoplasm of colon: Secondary | ICD-10-CM | POA: Diagnosis not present

## 2018-10-26 DIAGNOSIS — E1165 Type 2 diabetes mellitus with hyperglycemia: Secondary | ICD-10-CM | POA: Diagnosis not present

## 2018-10-26 DIAGNOSIS — M810 Age-related osteoporosis without current pathological fracture: Secondary | ICD-10-CM | POA: Diagnosis not present

## 2018-10-26 DIAGNOSIS — Z4681 Encounter for fitting and adjustment of insulin pump: Secondary | ICD-10-CM | POA: Diagnosis not present

## 2018-10-26 DIAGNOSIS — E7800 Pure hypercholesterolemia, unspecified: Secondary | ICD-10-CM | POA: Diagnosis not present

## 2018-10-26 DIAGNOSIS — I10 Essential (primary) hypertension: Secondary | ICD-10-CM | POA: Diagnosis not present

## 2018-10-26 DIAGNOSIS — I340 Nonrheumatic mitral (valve) insufficiency: Secondary | ICD-10-CM | POA: Diagnosis not present

## 2018-10-26 DIAGNOSIS — Z0001 Encounter for general adult medical examination with abnormal findings: Secondary | ICD-10-CM | POA: Diagnosis not present

## 2018-11-10 DIAGNOSIS — R69 Illness, unspecified: Secondary | ICD-10-CM | POA: Diagnosis not present

## 2018-11-29 DIAGNOSIS — I10 Essential (primary) hypertension: Secondary | ICD-10-CM | POA: Diagnosis not present

## 2018-11-29 DIAGNOSIS — E1165 Type 2 diabetes mellitus with hyperglycemia: Secondary | ICD-10-CM | POA: Diagnosis not present

## 2018-11-29 DIAGNOSIS — R69 Illness, unspecified: Secondary | ICD-10-CM | POA: Diagnosis not present

## 2018-11-29 DIAGNOSIS — M810 Age-related osteoporosis without current pathological fracture: Secondary | ICD-10-CM | POA: Diagnosis not present

## 2018-11-29 DIAGNOSIS — I469 Cardiac arrest, cause unspecified: Secondary | ICD-10-CM | POA: Diagnosis not present

## 2018-11-29 DIAGNOSIS — E559 Vitamin D deficiency, unspecified: Secondary | ICD-10-CM | POA: Diagnosis not present

## 2018-11-29 DIAGNOSIS — J45909 Unspecified asthma, uncomplicated: Secondary | ICD-10-CM | POA: Diagnosis not present

## 2018-11-29 DIAGNOSIS — I2510 Atherosclerotic heart disease of native coronary artery without angina pectoris: Secondary | ICD-10-CM | POA: Diagnosis not present

## 2018-11-29 DIAGNOSIS — I340 Nonrheumatic mitral (valve) insufficiency: Secondary | ICD-10-CM | POA: Diagnosis not present

## 2018-11-29 DIAGNOSIS — J0191 Acute recurrent sinusitis, unspecified: Secondary | ICD-10-CM | POA: Diagnosis not present

## 2018-11-29 DIAGNOSIS — E7800 Pure hypercholesterolemia, unspecified: Secondary | ICD-10-CM | POA: Diagnosis not present

## 2018-11-29 IMAGING — CT CT ABD-PEL WO/W CM
3 of 12 series · 11 of 46 positions shown, 17 images · IV contrast (iopamidol)
Comparison: CT scan 05/13/2017

CLINICAL DATA: Painless gross hematuria for 2-3 weeks

EXAM:
CT ABDOMEN AND PELVIS WITHOUT AND WITH CONTRAST
TECHNIQUE: Multidetector CT imaging of the abdomen and pelvis was performed
following the standard protocol before and following the bolus
administration of intravenous contrast.
CONTRAST:  100mL [id number]-IAA IOPAMIDOL ([id number]-IAA) INJECTION 61%

[Series 2: without pre · axial · non-contrast · 0.64mm/px · z∈[-1602,-1547]mm · 2 of 85 slices shown]
[im 11/85  soft-tissue]
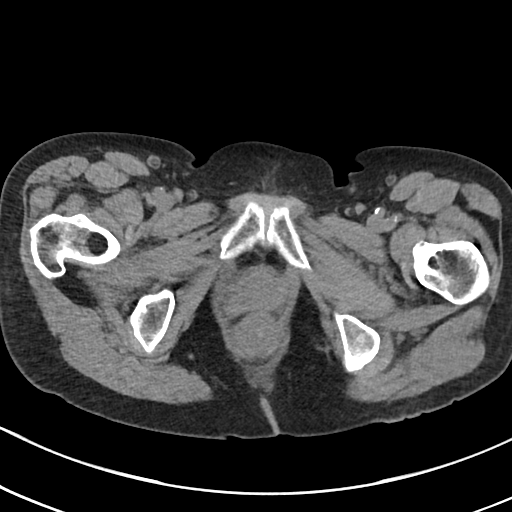
[im 22/85  soft-tissue]
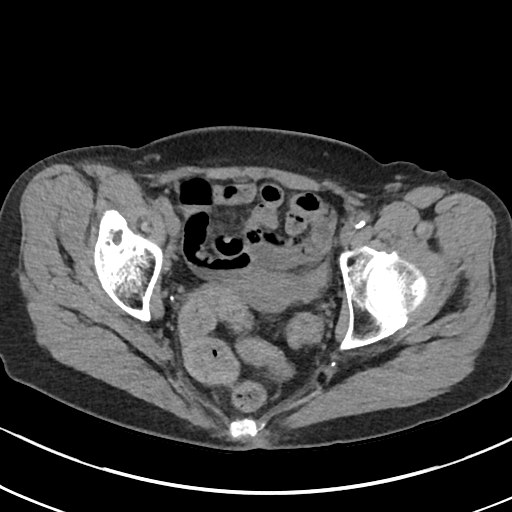

[Series 5: cor without without pre · coronal · non-contrast · 0.66mm/px · 2 of 119 slices shown, 3 images]
[im 40/119  soft-tissue]
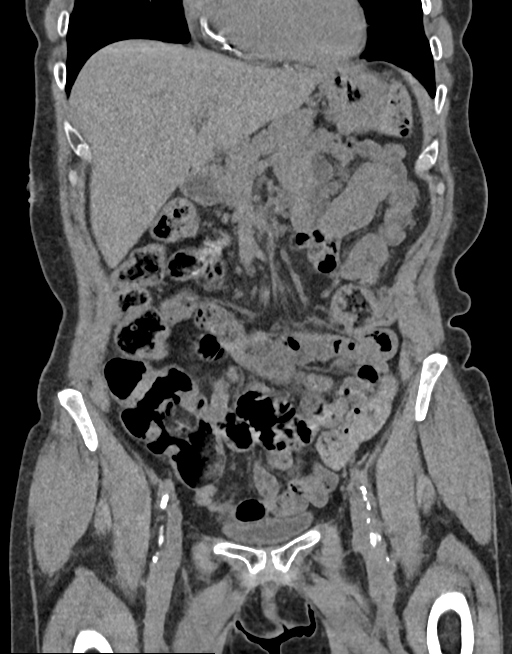
[im 40/119  bone]
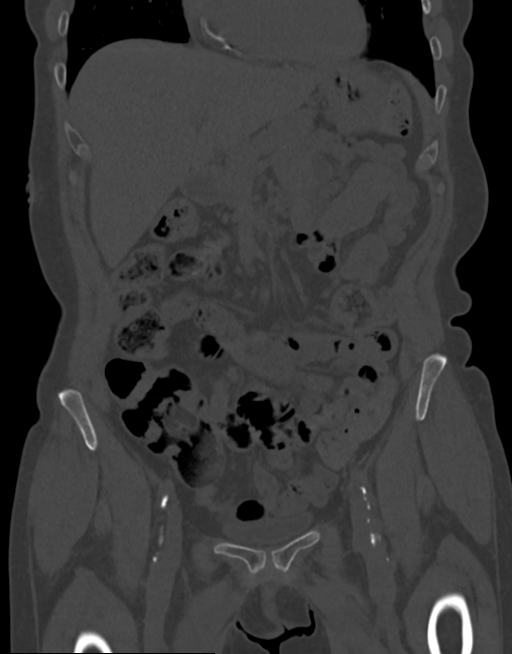
[im 79/119  soft-tissue]
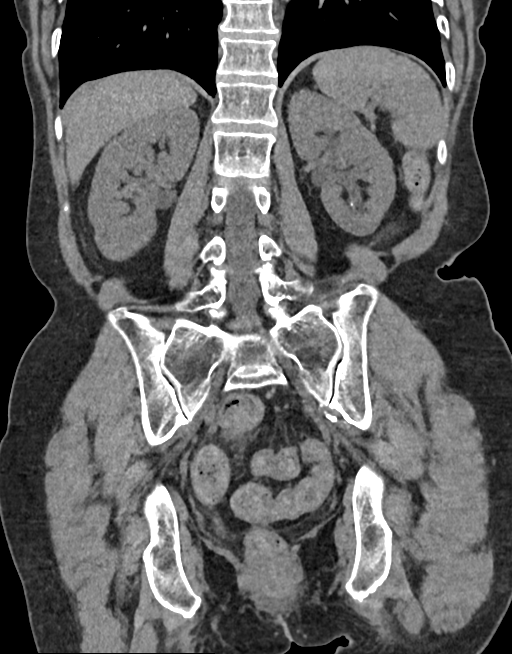

[Series 17: axial delay delay prone · axial · delayed · 0.64mm/px · z∈[-1557,-1227]mm · 7 of 88 slices shown, 12 images]
[im 11/88  soft-tissue]
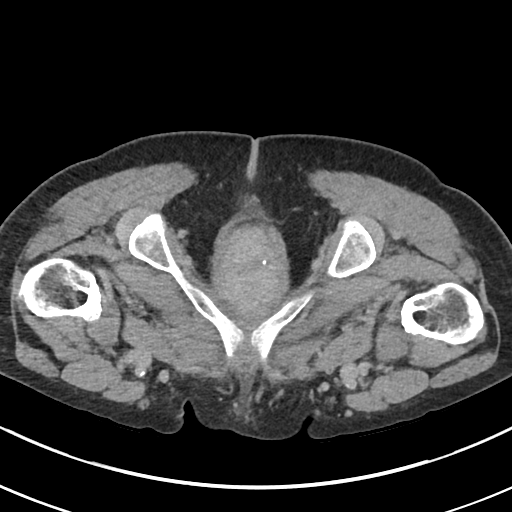
[im 11/88  bone]
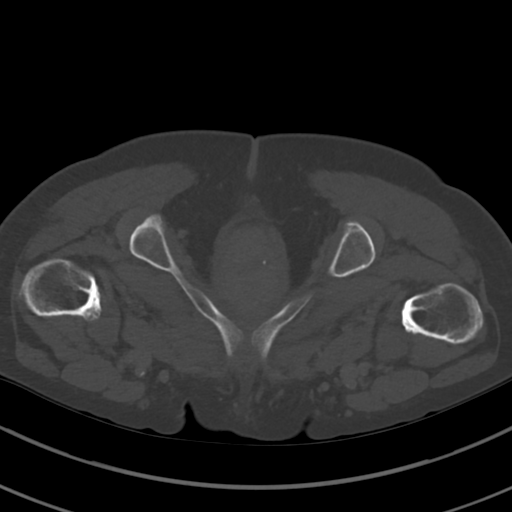
[im 22/88  soft-tissue]
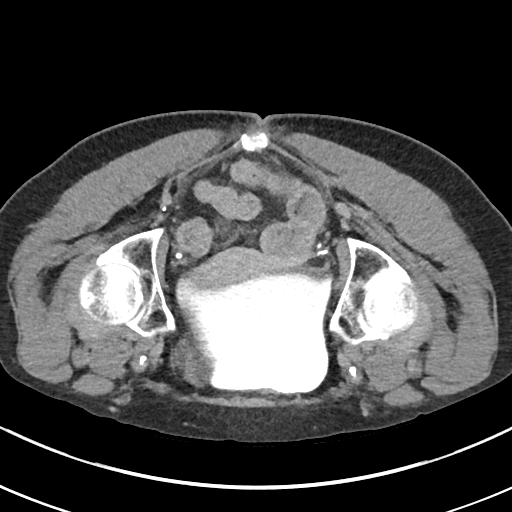
[im 33/88  soft-tissue]
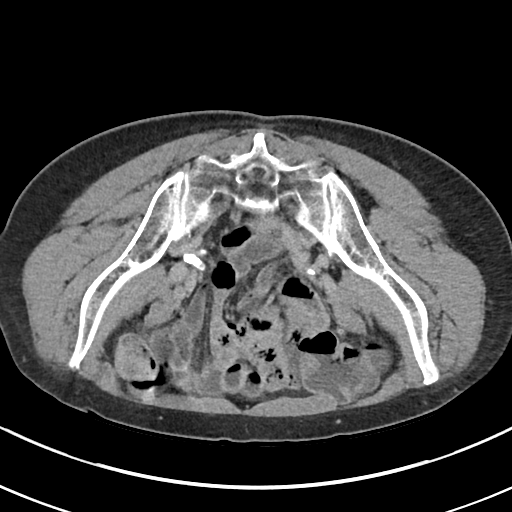
[im 44/88  soft-tissue]
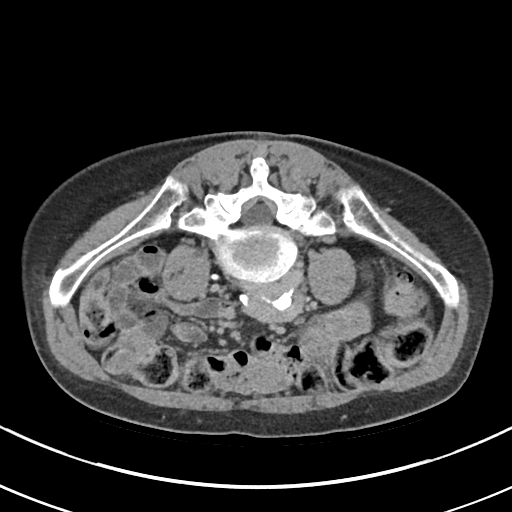
[im 44/88  lung]
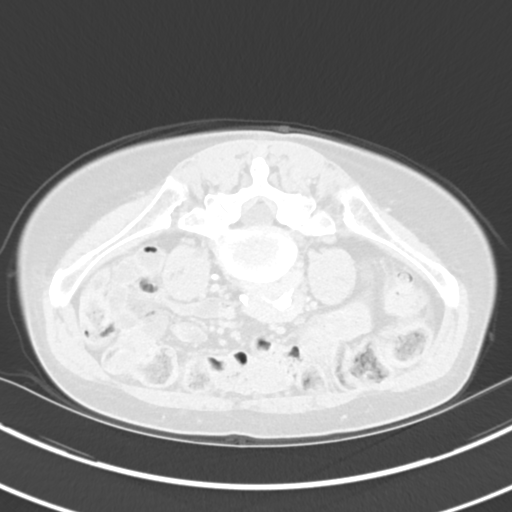
[im 55/88  soft-tissue]
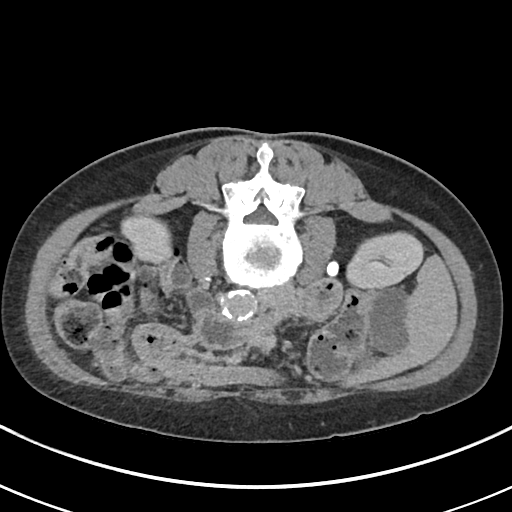
[im 55/88  lung]
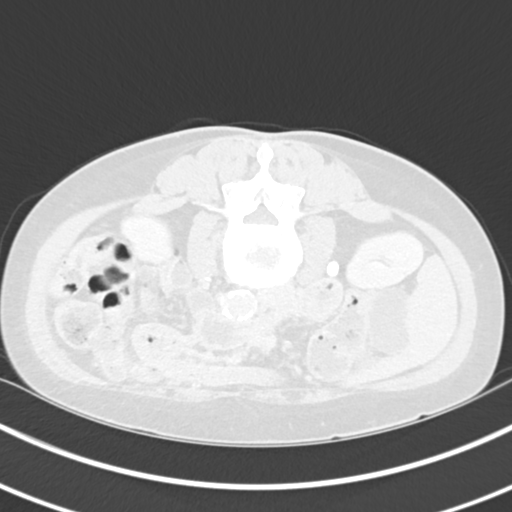
[im 66/88  soft-tissue]
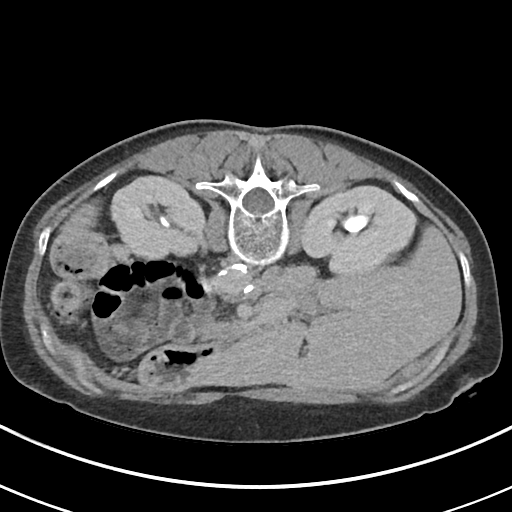
[im 66/88  lung]
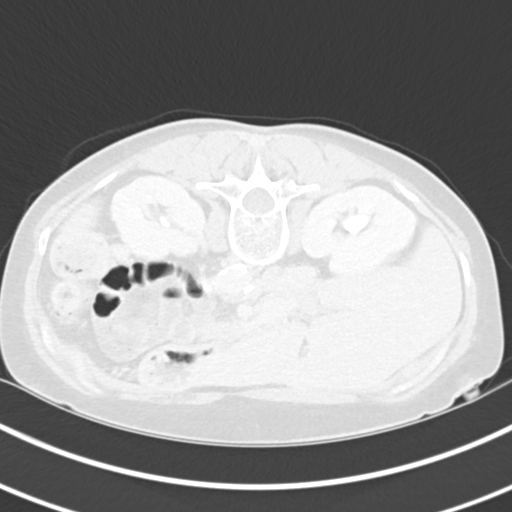
[im 77/88  soft-tissue]
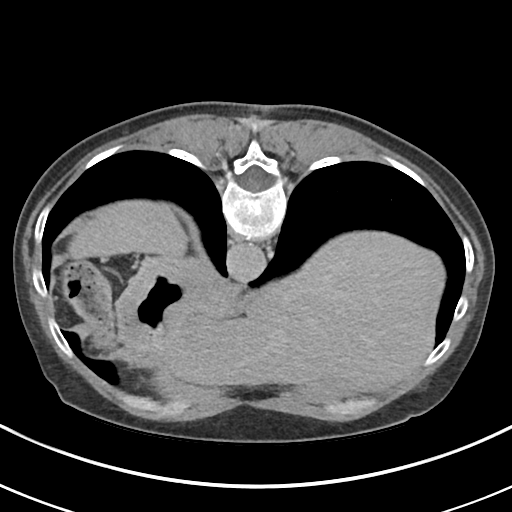
[im 77/88  lung]
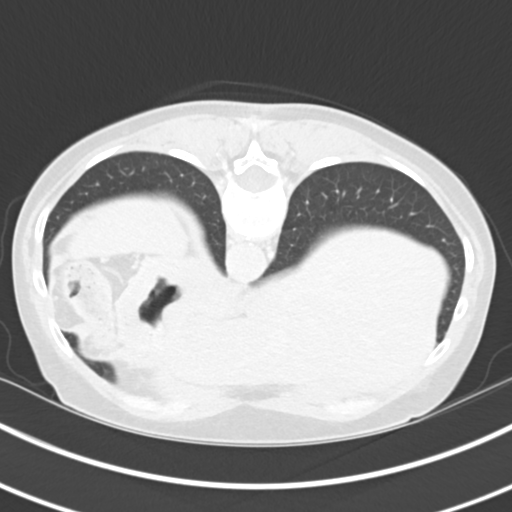

[11 of 46 positions shown; findings below may reference images not displayed]

FINDINGS: Lower chest: The lung bases are clear of an acute process. No
pleural effusion. Heart is normal in size. Coronary artery
calcifications and aortic calcifications are again noted.

Hepatobiliary: No focal hepatic lesions or intrahepatic biliary
dilatation. The gallbladder is normal. No common bile duct
dilatation.

Pancreas: No mass, inflammation or ductal dilatation.

Spleen: Normal size.  No focal lesions.

Adrenals/Urinary Tract: The adrenal glands are normal.

Small left renal calculi are noted. No obstructing ureteral calculi
or bladder calculi.

Both kidneys demonstrate normal enhancement/perfusion. No worrisome
renal lesions. The delayed images do not demonstrate any significant
collecting system abnormalities. Both ureters are normal. The
bladder is normal. No bladder mass or asymmetric bladder wall
thickening.

Stomach/Bowel: The stomach, duodenum, small bowel and colon are
grossly normal without oral contrast. No inflammatory changes, mass
lesions or obstructive findings. The terminal ileum and appendix are
normal.

Vascular/Lymphatic: Stable atherosclerotic calcifications and
chronic distal aortic dissection. The branch vessels are patent. The
major venous structures are patent.

No mesenteric or retroperitoneal mass or adenopathy.

Reproductive: Grossly normal uterus and ovaries. Calcified fundal
fibroid noted.

Other: No pelvic mass or adenopathy. No free pelvic fluid
collections. No inguinal mass or adenopathy. No abdominal wall
hernia or subcutaneous lesions.

Musculoskeletal: No significant bony findings. Mild compression
deformities of L4 and L5 are noted.
IMPRESSION: 1. Two small left renal calculi but no obstructing ureteral calculi
or bladder calculi.
2. Right-sided hydroureteronephrosis seen on the prior CT scan has
resolved.
3. No renal or bladder mass.
4. No acute abdominal/pelvic findings, mass lesions or adenopathy.
5. Stable advanced atherosclerotic disease involving the aorta and
iliac arteries.

## 2018-12-31 DIAGNOSIS — E109 Type 1 diabetes mellitus without complications: Secondary | ICD-10-CM | POA: Diagnosis not present

## 2019-01-01 DIAGNOSIS — I2510 Atherosclerotic heart disease of native coronary artery without angina pectoris: Secondary | ICD-10-CM | POA: Diagnosis not present

## 2019-01-01 DIAGNOSIS — J45909 Unspecified asthma, uncomplicated: Secondary | ICD-10-CM | POA: Diagnosis not present

## 2019-01-01 DIAGNOSIS — R69 Illness, unspecified: Secondary | ICD-10-CM | POA: Diagnosis not present

## 2019-01-01 DIAGNOSIS — M810 Age-related osteoporosis without current pathological fracture: Secondary | ICD-10-CM | POA: Diagnosis not present

## 2019-01-01 DIAGNOSIS — J0191 Acute recurrent sinusitis, unspecified: Secondary | ICD-10-CM | POA: Diagnosis not present

## 2019-01-01 DIAGNOSIS — E1165 Type 2 diabetes mellitus with hyperglycemia: Secondary | ICD-10-CM | POA: Diagnosis not present

## 2019-01-01 DIAGNOSIS — I340 Nonrheumatic mitral (valve) insufficiency: Secondary | ICD-10-CM | POA: Diagnosis not present

## 2019-01-01 DIAGNOSIS — E7800 Pure hypercholesterolemia, unspecified: Secondary | ICD-10-CM | POA: Diagnosis not present

## 2019-01-01 DIAGNOSIS — I10 Essential (primary) hypertension: Secondary | ICD-10-CM | POA: Diagnosis not present

## 2019-01-01 DIAGNOSIS — I469 Cardiac arrest, cause unspecified: Secondary | ICD-10-CM | POA: Diagnosis not present

## 2019-01-01 DIAGNOSIS — F418 Other specified anxiety disorders: Secondary | ICD-10-CM | POA: Diagnosis not present

## 2019-01-09 DIAGNOSIS — I10 Essential (primary) hypertension: Secondary | ICD-10-CM | POA: Diagnosis not present

## 2019-01-09 DIAGNOSIS — I340 Nonrheumatic mitral (valve) insufficiency: Secondary | ICD-10-CM | POA: Diagnosis not present

## 2019-01-09 DIAGNOSIS — E1165 Type 2 diabetes mellitus with hyperglycemia: Secondary | ICD-10-CM | POA: Diagnosis not present

## 2019-01-09 DIAGNOSIS — E559 Vitamin D deficiency, unspecified: Secondary | ICD-10-CM | POA: Diagnosis not present

## 2019-01-09 DIAGNOSIS — I469 Cardiac arrest, cause unspecified: Secondary | ICD-10-CM | POA: Diagnosis not present

## 2019-01-09 DIAGNOSIS — J13 Pneumonia due to Streptococcus pneumoniae: Secondary | ICD-10-CM | POA: Diagnosis not present

## 2019-01-09 DIAGNOSIS — E7800 Pure hypercholesterolemia, unspecified: Secondary | ICD-10-CM | POA: Diagnosis not present

## 2019-01-09 DIAGNOSIS — J45909 Unspecified asthma, uncomplicated: Secondary | ICD-10-CM | POA: Diagnosis not present

## 2019-01-09 DIAGNOSIS — M810 Age-related osteoporosis without current pathological fracture: Secondary | ICD-10-CM | POA: Diagnosis not present

## 2019-01-09 DIAGNOSIS — R69 Illness, unspecified: Secondary | ICD-10-CM | POA: Diagnosis not present

## 2019-01-09 DIAGNOSIS — I2510 Atherosclerotic heart disease of native coronary artery without angina pectoris: Secondary | ICD-10-CM | POA: Diagnosis not present

## 2019-01-10 ENCOUNTER — Other Ambulatory Visit: Payer: Self-pay

## 2019-01-10 MED ORDER — DILTIAZEM HCL ER COATED BEADS 240 MG PO CP24: ORAL_CAPSULE | ORAL | 0 refills | Status: DC

## 2019-01-10 NOTE — Telephone Encounter (Signed)
*  STAT* If patient is at the pharmacy, call can be transferred to refill team.   1. Which medications need to be refilled? (please list name of each medication and dose if known) Cardizem  2. Which pharmacy/location (including street and city if local pharmacy) is medication to be sent to? [place]  3. Do they need a 30 day or 90 day supply? [place]

## 2019-01-16 DIAGNOSIS — E1165 Type 2 diabetes mellitus with hyperglycemia: Secondary | ICD-10-CM | POA: Diagnosis not present

## 2019-01-16 DIAGNOSIS — I469 Cardiac arrest, cause unspecified: Secondary | ICD-10-CM | POA: Diagnosis not present

## 2019-01-16 DIAGNOSIS — M810 Age-related osteoporosis without current pathological fracture: Secondary | ICD-10-CM | POA: Diagnosis not present

## 2019-01-16 DIAGNOSIS — J45909 Unspecified asthma, uncomplicated: Secondary | ICD-10-CM | POA: Diagnosis not present

## 2019-01-16 DIAGNOSIS — E7800 Pure hypercholesterolemia, unspecified: Secondary | ICD-10-CM | POA: Diagnosis not present

## 2019-01-16 DIAGNOSIS — J13 Pneumonia due to Streptococcus pneumoniae: Secondary | ICD-10-CM | POA: Diagnosis not present

## 2019-01-16 DIAGNOSIS — I10 Essential (primary) hypertension: Secondary | ICD-10-CM | POA: Diagnosis not present

## 2019-01-16 DIAGNOSIS — R69 Illness, unspecified: Secondary | ICD-10-CM | POA: Diagnosis not present

## 2019-01-16 DIAGNOSIS — I2510 Atherosclerotic heart disease of native coronary artery without angina pectoris: Secondary | ICD-10-CM | POA: Diagnosis not present

## 2019-01-16 DIAGNOSIS — I340 Nonrheumatic mitral (valve) insufficiency: Secondary | ICD-10-CM | POA: Diagnosis not present

## 2019-01-17 ENCOUNTER — Other Ambulatory Visit: Payer: Self-pay | Admitting: Internal Medicine

## 2019-02-13 ENCOUNTER — Encounter: Payer: Self-pay | Admitting: Internal Medicine

## 2019-02-13 DIAGNOSIS — E559 Vitamin D deficiency, unspecified: Secondary | ICD-10-CM | POA: Diagnosis not present

## 2019-02-13 DIAGNOSIS — I2510 Atherosclerotic heart disease of native coronary artery without angina pectoris: Secondary | ICD-10-CM | POA: Diagnosis not present

## 2019-02-13 DIAGNOSIS — E1165 Type 2 diabetes mellitus with hyperglycemia: Secondary | ICD-10-CM | POA: Diagnosis not present

## 2019-02-25 DIAGNOSIS — R69 Illness, unspecified: Secondary | ICD-10-CM | POA: Diagnosis not present

## 2019-03-17 ENCOUNTER — Ambulatory Visit: Payer: Medicare HMO | Attending: Internal Medicine

## 2019-03-17 DIAGNOSIS — Z23 Encounter for immunization: Secondary | ICD-10-CM | POA: Insufficient documentation

## 2019-03-17 NOTE — Progress Notes (Signed)
   Covid-19 Vaccination Clinic  Name:  Lisa [name]    MRN: [id number] DOB: [birth year]  03/17/2019  Lisa [name] was observed post Covid-19 immunization for 30 minutes based on pre-vaccination screening without incidence. She was provided with Vaccine Information Sheet and instruction to access the V-Safe system.   Lisa [name] was instructed to call 911 with any severe reactions post vaccine: Marland Kitchen Difficulty breathing  . Swelling of your face and throat  . A fast heartbeat  . A bad rash all over your body  . Dizziness and weakness    Immunizations Administered    Name Date Dose VIS Date Route   Pfizer COVID-19 Vaccine 03/17/2019 10:04 AM 0.3 mL 01/11/2019 Intramuscular   Manufacturer: [place]   Lot: [id number]   [place]: [id number]

## 2019-04-10 ENCOUNTER — Ambulatory Visit: Payer: Medicare HMO | Attending: Internal Medicine

## 2019-04-10 DIAGNOSIS — Z23 Encounter for immunization: Secondary | ICD-10-CM

## 2019-04-10 NOTE — Progress Notes (Signed)
   Covid-19 Vaccination Clinic  Name:  Lisa [name]    MRN: [id number] DOB: [birth year]  04/10/2019  [name] was observed post Covid-19 immunization for 15 minutes without incident. She was provided with Vaccine Information Sheet and instruction to access the V-Safe system.   [name] was instructed to call 911 with any severe reactions post vaccine: Marland Kitchen Difficulty breathing  . Swelling of face and throat  . A fast heartbeat  . A bad rash all over body  . Dizziness and weakness

## 2019-04-11 ENCOUNTER — Other Ambulatory Visit: Payer: Self-pay

## 2019-04-11 MED ORDER — DILTIAZEM HCL ER COATED BEADS 240 MG PO CP24: ORAL_CAPSULE | ORAL | 0 refills | Status: DC

## 2019-04-24 ENCOUNTER — Encounter: Payer: Self-pay | Admitting: Internal Medicine

## 2019-04-24 ENCOUNTER — Other Ambulatory Visit: Payer: Self-pay

## 2019-04-24 ENCOUNTER — Ambulatory Visit: Payer: Medicare HMO | Admitting: Internal Medicine

## 2019-04-24 VITALS — BP 120/72 | HR 88 | Ht 63.0 in | Wt 140.0 lb

## 2019-04-24 DIAGNOSIS — I2510 Atherosclerotic heart disease of native coronary artery without angina pectoris: Secondary | ICD-10-CM

## 2019-04-24 DIAGNOSIS — E108 Type 1 diabetes mellitus with unspecified complications: Secondary | ICD-10-CM

## 2019-04-24 DIAGNOSIS — I739 Peripheral vascular disease, unspecified: Secondary | ICD-10-CM

## 2019-04-24 DIAGNOSIS — I480 Paroxysmal atrial fibrillation: Secondary | ICD-10-CM | POA: Diagnosis not present

## 2019-04-24 DIAGNOSIS — E785 Hyperlipidemia, unspecified: Secondary | ICD-10-CM | POA: Diagnosis not present

## 2019-04-24 NOTE — Progress Notes (Signed)
Follow-up Outpatient Visit Date: 04/24/2019  Primary Care Provider: Lenard Simmer, MD [address] [id number]  Chief Complaint: Follow-up coronary artery disease  HPI:  Lisa [name] is a 69 y.o. female with history of  coronary artery disease status post remote coronary intervention at [facility] (the late 90s) and cardiac arrest in the setting of anaphylaxis with catheterization demonstrating severe 2 vessel CAD(subsequent atherectomy and PCI to RCA),recurrent pericardial effusion, type1 diabetes mellitus, peripheral vascular disease with AAA, right lower lobe pneumonia with mucous plugging, and spontaneous right thigh hematoma in the setting of anticoagulation, who presents for follow-up of coronary artery disease.  I last saw her in 10/2018, which time she was feeling well.  We did not make any medication changes at that time.  Today, [name] reports that she continues to feel well.  She denies chest pain, shortness of breath, palpitations, lightheadedness, and edema.  She does not have any limitations, including when walking for extended periods.  She notes that she contracted bronchitis and pneumonia in January, necessitating steroids.  Her blood sugars were quite labile while taking steroids, though they are back under good control.  --------------------------------------------------------------------------------------------------  Cardiovascular History & Procedures: Cardiovascular Problems:  Coronary artery disease with VF arrest and transient complete heart block in the setting of anaphylactic shock  Recurrent pericardial effusion  Paroxysmal atrial fibrillation  Risk Factors:  Known coronary artery disease, peripheral vascular disease, and type 1 diabetes mellitus  Cath/PCI:  PCI (12/02/16): Successful orbital atherectomy and PCI to heavily calcified mid RCA disease with placement of a Xience Alpine 3.0 x 33 mm drug-eluting stent.  LHC (10/11/16): Severe  two-vessel coronary artery disease including heavily calcified 95% proximal/mid LCx and sequential 90-99% mid RCA stenoses. Mild to moderate, nonobstructive disease involving the LMCA and LAD. Basal inferior hypokinesis with overall preserved LV contraction. Mildly elevated LVEDP. Peripheral vascular disease with ectasia and calcification of the infrarenal abdominal aorta and iliac arteries.  CV Surgery:  None  EP Procedures and Devices:  None  Non-Invasive Evaluation(s):  Limited TTE (01/19/17): Trivial pericardial effusion, significantly decreased in size from prior study. LVEF 55-60%.  Limited TTE (12/13/16): Normal LV size with LVEF of 65-70%. Normal RV size and function. Moderate to large circumferential pericardial effusion, stable to slightly decreased in size compared with 12/09/16. No evidence of tamponadephysiology.  TTE (10/25/16): Normal LV size with mild to moderate LVH. LVEF 60-65% with grade 2 diastolic dysfunction. RV size and function. Normal PA pressure. Small-to-moderate pericardial effusion.  TTE (10/12/16): Normal LV size. LVEF 55-60%. Normal diastolic function. Normal RV size and function. Normal PA pressure.  Recent CV Pertinent Labs: Lab Results  Component Value Date   CHOL 81 10/13/2016   HDL 34 (L) 10/13/2016   LDLCALC 36 10/13/2016   TRIG 56 10/13/2016   CHOLHDL 2.4 10/13/2016   INR 0.94 11/23/2016   BNP 69.0 10/25/2016   K 4.2 05/13/2017   MG 1.6 (L) 12/08/2016   BUN 17 05/13/2017   CREATININE 0.85 05/13/2017    Past medical and surgical history were reviewed and updated in EPIC.  Current Meds  Medication Sig  . ALPRAZolam (XANAX) 0.25 MG tablet Take 0.25 mg by mouth 2 (two) times daily as needed (for anxiety.).   Marland Kitchen aspirin EC 81 MG tablet Take 81 mg by mouth daily.  Marland Kitchen atorvastatin (LIPITOR) 40 MG tablet Take 40 mg by mouth at bedtime.   Marland Kitchen diltiazem (CARDIZEM CD) 240 MG 24 hr capsule TAKE 1 CAPSULE BY MOUTH  EVERY DAY  . insulin aspart  (NOVOLOG) cartridge Inject into the skin 3 (three) times daily with meals.  . nitroGLYCERIN (NITROSTAT) 0.4 MG SL tablet Place 1 tablet (0.4 mg total) under the tongue every 5 (five) minutes x 3 doses as needed for chest pain.  Marland Kitchen PROLIA 60 MG/ML SOSY injection every 6 (six) months.   . Vitamin D, Ergocalciferol, (DRISDOL) 50000 units CAPS capsule Take 50,000 Units by mouth every 30 (thirty) days.     Allergies: Ceftriaxone, Penicillins, Rocephin [ceftriaxone sodium in dextrose], and Albuterol  Social History   Tobacco Use  . Smoking status: Former Smoker    Packs/day: 0.25    Years: 47.00    Pack years: 11.75    Types: Cigarettes    Start date: 01/25/1969    Quit date: 10/11/2016    Years since quitting: 2.5  . Smokeless tobacco: Never Used  . Tobacco comment: 12/16/2016 Wants to quit, has not set quit date.  Has been informed about resources available for smoking cessation  Substance Use Topics  . Alcohol use: No    Alcohol/week: 0.0 standard drinks  . Drug use: No    Family History  Problem Relation Age of Onset  . Hypertension Mother   . Aortic aneurysm Father   . Hypertension Brother   . Lung disease Neg Hx   . Rheumatologic disease Neg Hx     Review of Systems: A 12-system review of systems was performed and was negative except as noted in the HPI.  --------------------------------------------------------------------------------------------------  Physical Exam: BP 120/72 (BP Location: Left Arm, Patient Position: Sitting, Cuff Size: Normal)   Pulse 88   Ht 5\' 3"  (1.6 m)   Wt 140 lb (63.5 kg)   SpO2 99%   BMI 24.80 kg/m   General: NAD. Neck: No JVD or HJR. Lungs: Clear to auscultation bilaterally without wheezes or crackles. Heart: Regular rate and rhythm without murmurs, rubs, or gallops. Abdomen: Soft, nontender, nondistended. Extremities: No lower extremity edema.  1+ pedal pulses bilaterally.  EKG: Normal sinus rhythm with borderline significant  inferolateral Q waves, more pronounced than on prior tracing from 10/17/2018.  Otherwise, no significant interval change.  Lab Results  Component Value Date   WBC 6.9 05/13/2017   HGB 12.1 05/13/2017   HCT 36.8 05/13/2017   MCV 82.2 05/13/2017   PLT 265 05/13/2017    Lab Results  Component Value Date   NA 131 (L) 05/13/2017   K 4.2 05/13/2017   CL 100 (L) 05/13/2017   CO2 26 05/13/2017   BUN 17 05/13/2017   CREATININE 0.85 05/13/2017   GLUCOSE 94 05/13/2017   ALT 20 05/07/2017    Lab Results  Component Value Date   CHOL 81 10/13/2016   HDL 34 (L) 10/13/2016   LDLCALC 36 10/13/2016   TRIG 56 10/13/2016   CHOLHDL 2.4 10/13/2016    --------------------------------------------------------------------------------------------------  ASSESSMENT AND PLAN: Coronary artery disease without angina: Ms. Tainter continues to do well without angina.  We have agreed to continue with aggressive medical therapy.  EKG today shows more pronounced inferolateral Q waves, which could be related to known severe LCx disease.  However, given lack of symptoms and complicated history including issues with bleeding while on anticoagulation/antiplatelet therapy, I think it would be best to defer further intervention unless symptoms develop.  We will plan to continue secondary prevention with aspirin and atorvastatin 80 mg daily.  Paroxysmal atrial fibrillation: No symptoms to suggest recurrence.  EKG today demonstrates sinus rhythm.  We  will continue with diltiazem.  We will defer anticoagulation given history of bleeding issues, including spontaneous thigh hematoma while on IV heparin.  Hyperlipidemia: Continue atorvastatin 40 mg daily.  We will request a copy of most recent labs drawn by Dr. Ronnald Collum to ensure that LDL is at goal (less than 70).  Type 1 diabetes mellitus: Ms. Severin reports hyperglycemia earlier this year in the setting of steroid use for pulmonary infection.  She should continue close  follow-up with Dr. Ronnald Collum.  Peripheral vascular disease: Ectasia and possible aneurysmal dilation of the infrarenal abdominal aorta noted at the time of catheterization in 2018.  We will obtain a aortoiliac ultrasound to reevaluate this.  Ms. Makarewicz does not have any claudication.  Continue aspirin and statin therapy.  Follow-up: Return to clinic in 6 months.  Nelva Bush, MD 04/25/2019 10:47 AM

## 2019-04-24 NOTE — Patient Instructions (Signed)
Medication Instructions:  Your physician recommends that you continue on your current medications as directed. Please refer to the Current Medication list given to you today.  *If you need a refill on your cardiac medications before your next appointment, please call your pharmacy*   Lab Work: Worthy Keeler will be requested from your primary care physician.  If you have labs (blood work) drawn today and your tests are completely normal, you will receive your results only by: Marland Kitchen MyChart Message (if you have MyChart) OR . A paper copy in the mail If you have any lab test that is abnormal or we need to change your treatment, we will call you to review the results.   Testing/Procedures: Your physician has requested that you have an abdominal aorta duplex. During this test, an ultrasound is used to evaluate the aorta. Allow 30 minutes for this exam. Do not eat after midnight the day before and avoid carbonated beverages AORTA/IVC/ILIACS  No food after 11PM the night before.  Water is OK. (Don't drink liquids if you have been instructed not to for ANOTHER test).  Take two Extra-Strength Gas-X capsules at bedtime the night before test.   Take an additional two Extra-Strength Gas-X capsules three (3) hours before the test or first thing in the morning.    Avoid foods that produce bowel gas, for 24 hours prior to exam (see below).    No breakfast, no chewing gum, no smoking or carbonated beverages.  Patient may take morning medications with water.  Come in for test at least 15 minutes early to register.   Follow-Up: At [facility], you and your health needs are our priority.  As part of our continuing mission to provide you with exceptional heart care, we have created designated Provider Care Teams.  These Care Teams include your primary Cardiologist (physician) and Advanced Practice Providers (APPs -  Physician Assistants and Nurse Practitioners) who all work together to provide you with the  care you need, when you need it.  We recommend signing up for the patient portal called "MyChart".  Sign up information is provided on this After Visit Summary.  MyChart is used to connect with patients for Virtual Visits (Telemedicine).  Patients are able to view lab/test results, encounter notes, upcoming appointments, etc.  Non-urgent messages can be sent to your provider as well.   To learn more about what you can do with MyChart, go to [email or web address].    Your next appointment:   6 month(s)  The format for your next appointment:   In Person  Provider:    You may see Nelva Bush, MD or one of the following Advanced Practice Providers on your designated Care Team:    Murray Hodgkins, NP  Christell Faith, PA-C  Marrianne Mood, PA-C

## 2019-04-25 ENCOUNTER — Encounter: Payer: Self-pay | Admitting: Internal Medicine

## 2019-05-06 NOTE — Progress Notes (Signed)
05/07/19 11:04 AM   Lisa [name] October 02, 1950 [id number]  Referring provider: Lenard Simmer, MD [address] [place],  [place] [address]  Chief Complaint  Patient presents with  . Recurrent UTI    HPI: Lisa [name] is a 69 y.o. with a PMHx of gross hematuria and hydronephrosis presents today for the evaluation and management of rUTIs. She was last seen for a cystoscopy on 06/07/2017. She was referred to [place] by Lenard Simmer, MD.   She had an extensive evaluation in 2019 including CTU which showed 2 small renal calculi and resolution of right sided hydroureteronephrosis as well as unremarkable cysto.  Cath UA ordered by Dr. Ronnald Collum at [facility] positive x5 for microscopic hematuria, and pyuria ranging from 04/25/18 to 04/02/19. Associated positive urine cultures below.   She reports of bad odor and odd appearance of urine.  These of the only symptoms which are associated with her "infections".  In the past she was using topical estrogen however not currently. She reports of drinking cranberry juice.   She denies dysuria, urgency, frequency, leakage and incomplete bladder emptying. She also denies use of Pyridium.   PVR 259 mL. Unable to void on command today, however not worrisome given no prior issues before.   No hx of any cancer.   No flank pain or gross hematuria.  +Urine culture 04/02/19 indicative of Enter.faecalis resistant to tetracycline 05/15/18 indicative of Staph. Epidermidis resistant to cipro, levofloxacin, penicillin 04/25/18 indicative of Enter.faecalis   PMH: Past Medical History:  Diagnosis Date  . CAD (coronary artery disease)    a. 1999 PTCA @ [facility]; b. NSTEMI/Cath 10/11/2016: LM 20%, oLAD 40%, p-mLAD 40%, dLAD 40%, p-mLCx 95%, pRCA 30%, 95/81m, EF 55-65%; c. 10/2016 CT Surgery->rec PCI due to poor lung fxn. d. 12/02/16 DES to mid RCA.  . Groin hematoma    a. 10/2016 Spont groin hematoma w/ anemia req 1u prbcs.  . High cholesterol   .  History of kidney stones   . Mucus plugging of bronchi    a. 10/2016 RLL Bronchus obstruction->bronchus vs mass;  b. 10/2016 CT Chest: resolution of RLL mucous plug.  . NSTEMI (non-ST elevated myocardial infarction) ([place]) 10/11/2016  . PAF (paroxysmal atrial fibrillation) ([place])    a. 12/2016 Noted during diag cath--CHA2DS2VASc = 3. No OAC in setting of need for DAPT and h/o spont Thigh Hematoma in 10/2016.  Marland Kitchen Pericardial effusion    a. 10/2016 Echo: EF 60-65%, no rwma, Gr2 DD, small to mod pericardial eff. No tamponade; b. 12/2016 Echo: EF 60-65%, no rwma, Gr1 DD, 1.2 to 1.4 cm mod to large circumferential pericardial effusion w/o tamponade; c. 12/2016 Echo: EF 55-60%, no rwma, triv effusion.  . Pneumonia 02/2016   "couple times before 02/2016 too" (12/02/2016)  . PVD (peripheral vascular disease) ([place])    a. Abdominal aortogram 10/11/16: Infrarenal abdominal aorta heavily calcified & ectatic with approximately 40-50% stenosis at the bifurcation. Mild diffuse disease w/ heavy calcification noted in the common & external iliac arteries bilaterally  . Type I diabetes mellitus ([place])    a. On insulin pump.(12/02/2016)  . Ventricular fibrillation ([place])    a. 10/11/2016: Felt to be secondary to demand ischemia in the setting of underlying CAD and anaphylactic reaction from IV Rocephin; b. 10/2016 Echo: EF 55-60%, ? mild inf HK; c. 10/2016 Seen by EP: No ICD indication.    Surgical History: Past Surgical History:  Procedure Laterality Date  . ABDOMINAL AORTOGRAM N/A 10/11/2016  Procedure: ABDOMINAL AORTOGRAM;  Surgeon: Nelva Bush, MD;  Location: [facility];  Service: Cardiovascular;  Laterality: N/A;  . CARDIAC CATHETERIZATION  10/2016  . CERVICAL BIOPSY  W/ LOOP ELECTRODE EXCISION  03/05/2012  . COLONOSCOPY    . COLONOSCOPY WITH PROPOFOL N/A 06/27/2018   Procedure: COLONOSCOPY WITH PROPOFOL;  Surgeon: Robert Bellow, MD;  Location: [facility];  Service: Endoscopy;  Laterality: N/A;   . COLPOSCOPY  01/17/2011  . CORONARY ANGIOPLASTY WITH STENT PLACEMENT  1999; 12/02/2016   "1 stent  + 1 stent"  . CORONARY ATHERECTOMY N/A 12/02/2016   Procedure: CORONARY ATHERECTOMY - CSI;  Surgeon: Nelva Bush, MD;  Location: [facility];  Service: Cardiovascular;  Laterality: N/A;  . CORONARY STENT INTERVENTION N/A 12/02/2016   Procedure: CORONARY STENT INTERVENTION;  Surgeon: Nelva Bush, MD;  Location: [facility];  Service: Cardiovascular;  Laterality: N/A;  . LEFT HEART CATH AND CORONARY ANGIOGRAPHY N/A 10/11/2016   Procedure: LEFT HEART CATH AND CORONARY ANGIOGRAPHY;  Surgeon: Nelva Bush, MD;  Location: [facility];  Service: Cardiovascular;  Laterality: N/A;  . NASAL SINUS SURGERY  ~ 2009  . TEMPORARY PACEMAKER N/A 12/02/2016   Procedure: TEMPORARY PACEMAKER;  Surgeon: Nelva Bush, MD;  Location: [facility];  Service: Cardiovascular;  Laterality: N/A;  . TONSILLECTOMY      Home Medications:  Allergies as of 05/07/2019      Reactions   Ceftriaxone    Anaphylaxis, cardiac arrest   Penicillins Anaphylaxis   Rocephin [ceftriaxone Sodium In Dextrose] Anaphylaxis   Albuterol Other (See Comments)   Tachycardia-->Afib. Tolerates Xopenex      Medication List       Accurate as of May 07, 2019 11:04 AM. If you have any questions, ask your nurse or doctor.        ALPRAZolam 0.25 MG tablet Commonly known as: XANAX Take 0.25 mg by mouth 2 (two) times daily as needed (for anxiety.).   aspirin EC 81 MG tablet Take 81 mg by mouth daily.   atorvastatin 40 MG tablet Commonly known as: LIPITOR Take 40 mg by mouth at bedtime.   diltiazem 240 MG 24 hr capsule Commonly known as: CARDIZEM CD TAKE 1 CAPSULE BY MOUTH EVERY DAY   insulin aspart cartridge Commonly known as: NOVOLOG Inject into the skin 3 (three) times daily with meals.   nitroGLYCERIN 0.4 MG SL tablet Commonly known as: NITROSTAT Place 1 tablet (0.4 mg total) under  the tongue every 5 (five) minutes x 3 doses as needed for chest pain.   OneTouch Verio test strip Generic drug: glucose blood USE TO TEST FOUR TIMES DAILY   Premarin vaginal cream Generic drug: conjugated estrogens Place 1 Applicatorful vaginally daily. Use pea sized amount M-W-Fr before bedtime Started by: Hollice Espy, MD   Prolia 60 MG/ML Sosy injection Generic drug: denosumab every 6 (six) months.   Vitamin D (Ergocalciferol) 1.25 MG (50000 UNIT) Caps capsule Commonly known as: DRISDOL Take 50,000 Units by mouth every 30 (thirty) days.       Allergies:  Allergies  Allergen Reactions  . Ceftriaxone     Anaphylaxis, cardiac arrest  . Penicillins Anaphylaxis  . Rocephin [Ceftriaxone Sodium In Dextrose] Anaphylaxis  . Albuterol Other (See Comments)    Tachycardia-->Afib. Tolerates Xopenex    Family History: Family History  Problem Relation Age of Onset  . Hypertension Mother   . Aortic aneurysm Father   . Hypertension Brother   . Lung disease  Neg Hx   . Rheumatologic disease Neg Hx     Social History:  reports that she quit smoking about 2 years ago. Her smoking use included cigarettes. She started smoking about 50 years ago. She has a 11.75 pack-year smoking history. She has never used smokeless tobacco. She reports that she does not drink alcohol or use drugs.   Physical Exam: BP 139/82   Pulse 77   Ht 5\' 3"  (1.6 m)   Wt 136 lb (61.7 kg)   BMI 24.09 kg/m   Constitutional:  Alert and oriented, No acute distress. HEENT: [place] AT, moist mucus membranes.  Trachea midline, no masses. Cardiovascular: No clubbing, cyanosis, or edema. Respiratory: Normal respiratory effort, no increased work of breathing. Skin: No rashes, bruises or suspicious lesions. Neurologic: Grossly intact, no focal deficits, moving all 4 extremities. Psychiatric: Normal mood and affect.  Laboratory Data:  Urinalysis UA today nitrite positive otherwise unremarkable.    Pertinent  Imaging: Results for orders placed or performed in visit on 05/07/19  BLADDER SCAN AMB NON-IMAGING  Result Value Ref Range   Scan Result 259 ML    Assessment & Plan:    1. rUTI UA today nitrite positive otherwise negative. Consistent with chronic bacteruria.  No treatable UTI symptoms today -we will send culture but not plan shingle if she becomes truly symptomatic Pt was unable to void on command, PVR 259 mL (prevoid) Recommended topical estrogen cream and cranberry tablets  Rx of Premarin sent to pharmacy   2. Left kidney stones KUB today to rule out small non obstructing stones as a contributing factor to above   3. False smelling urine Pt counseled on odor and appearance alone not being an indication for treatment of UTI. More consistent with asymptomatic bacteruria. Only treating UTI symptoms of dysuria, urgency, frequency and fever.  Educated pt on antibiotic resistance/ [facility] [address], [place], [place] [address] [phone]  I, Lucas Mallow, am acting as a scribe for Dr. Hollice Espy,  I have reviewed the above documentation for accuracy and completeness, and I agree with the above.   Hollice Espy, MD

## 2019-05-07 ENCOUNTER — Ambulatory Visit
Admission: RE | Admit: 2019-05-07 | Discharge: 2019-05-07 | Disposition: A | Discharge status: Home / Self Care | Payer: Medicare HMO | Source: Ambulatory Visit | Attending: Urology | Admitting: Urology

## 2019-05-07 ENCOUNTER — Ambulatory Visit: Payer: Medicare HMO | Admitting: Urology

## 2019-05-07 ENCOUNTER — Other Ambulatory Visit: Payer: Self-pay

## 2019-05-07 VITALS — BP 139/82 | HR 77 | Ht 63.0 in | Wt 136.0 lb

## 2019-05-07 DIAGNOSIS — N200 Calculus of kidney: Secondary | ICD-10-CM

## 2019-05-07 DIAGNOSIS — R8290 Unspecified abnormal findings in urine: Secondary | ICD-10-CM | POA: Diagnosis not present

## 2019-05-07 DIAGNOSIS — N390 Urinary tract infection, site not specified: Secondary | ICD-10-CM

## 2019-05-07 LAB — BLADDER SCAN AMB NON-IMAGING: Scan Result: 259

## 2019-05-07 MED ORDER — PREMARIN 0.625 MG/GM VA CREA: 1.0000 | TOPICAL_CREAM | Freq: Every day | VAGINAL | 12 refills | Status: DC

## 2019-05-08 ENCOUNTER — Telehealth: Payer: Self-pay | Admitting: *Deleted

## 2019-05-08 LAB — MICROSCOPIC EXAMINATION

## 2019-05-08 LAB — URINALYSIS, COMPLETE
Bilirubin, UA: NEGATIVE
Glucose, UA: NEGATIVE
Ketones, UA: NEGATIVE
Leukocytes,UA: NEGATIVE
Nitrite, UA: POSITIVE — AB
Protein,UA: NEGATIVE
Specific Gravity, UA: 1.015 (ref 1.005–1.030)
Urobilinogen, Ur: 0.2 mg/dL (ref 0.2–1.0)
pH, UA: 7 (ref 5.0–7.5)

## 2019-05-08 NOTE — Telephone Encounter (Addendum)
Patient voiced understanding.   ----- Message from Hollice Espy, MD sent at 05/08/2019  8:11 AM EDT ----- Stones are not appreciated on this Xray.  Good news.   Hollice Espy, MD

## 2019-05-13 LAB — CULTURE, URINE COMPREHENSIVE

## 2019-05-20 ENCOUNTER — Telehealth: Payer: Self-pay

## 2019-05-20 NOTE — Telephone Encounter (Signed)
Patient is due for a one year follow up anoscopy exam. This was done last year with Dr Bary Castilla who no longer is at this office. Message left for the patient to call back to let us know where she would like to go for her follow up.

## 2019-05-24 ENCOUNTER — Other Ambulatory Visit: Payer: Self-pay

## 2019-05-24 ENCOUNTER — Ambulatory Visit (INDEPENDENT_AMBULATORY_CARE_PROVIDER_SITE_OTHER): Payer: Medicare HMO

## 2019-05-24 DIAGNOSIS — I739 Peripheral vascular disease, unspecified: Secondary | ICD-10-CM

## 2019-05-27 ENCOUNTER — Other Ambulatory Visit: Payer: Self-pay | Admitting: *Deleted

## 2019-05-27 DIAGNOSIS — I714 Abdominal aortic aneurysm, without rupture, unspecified: Secondary | ICD-10-CM

## 2019-07-11 ENCOUNTER — Other Ambulatory Visit: Payer: Self-pay

## 2019-07-11 MED ORDER — DILTIAZEM HCL ER COATED BEADS 240 MG PO CP24: ORAL_CAPSULE | ORAL | 0 refills | Status: DC

## 2019-08-09 ENCOUNTER — Ambulatory Visit: Payer: Medicare HMO | Admitting: Urology

## 2019-08-09 ENCOUNTER — Ambulatory Visit: Payer: Medicare HMO

## 2019-08-09 ENCOUNTER — Other Ambulatory Visit: Payer: Self-pay

## 2019-08-09 ENCOUNTER — Other Ambulatory Visit
Admission: RE | Admit: 2019-08-09 | Discharge: 2019-08-09 | Disposition: A | Discharge status: Home / Self Care | Payer: Medicare HMO | Attending: Urology | Admitting: Urology

## 2019-08-09 ENCOUNTER — Encounter: Payer: Self-pay | Admitting: Urology

## 2019-08-09 ENCOUNTER — Telehealth: Payer: Self-pay

## 2019-08-09 VITALS — BP 137/77 | HR 63 | Ht 63.0 in | Wt 139.0 lb

## 2019-08-09 DIAGNOSIS — R8290 Unspecified abnormal findings in urine: Secondary | ICD-10-CM | POA: Diagnosis not present

## 2019-08-09 DIAGNOSIS — R300 Dysuria: Secondary | ICD-10-CM

## 2019-08-09 LAB — BLADDER SCAN AMB NON-IMAGING

## 2019-08-09 LAB — URINALYSIS, COMPLETE (UACMP) WITH MICROSCOPIC
Bilirubin Urine: NEGATIVE
Glucose, UA: NEGATIVE mg/dL
Ketones, ur: NEGATIVE mg/dL
Nitrite: POSITIVE — AB
Protein, ur: 30 mg/dL — AB
Specific Gravity, Urine: 1.01 (ref 1.005–1.030)
Squamous Epithelial / HPF: NONE SEEN (ref 0–5)
WBC, UA: >50 WBC/hpf (ref 0–5)
pH: 6.5 (ref 5.0–8.0)

## 2019-08-09 MED ORDER — NITROFURANTOIN MONOHYD MACRO 100 MG PO CAPS
100.0000 mg | ORAL_CAPSULE | Freq: Two times a day (BID) | ORAL | 0 refills | Status: DC
Start: 2019-08-09 — End: 2020-03-16

## 2019-08-09 NOTE — Telephone Encounter (Signed)
Left message for patient.. Called patient to see if she can go to [facility] location this afternoon to see Dr Erlene Quan. Report to lab at 12:30 for UA, appt at 1:15 pm

## 2019-08-09 NOTE — Progress Notes (Signed)
08/09/2019 4:06 PM   Lisa [name] Apr 19, 1950 [id number]  Referring provider: Lenard Simmer, MD [address] [place],  [place] [address] Chief Complaint  Patient presents with  . Acute Visit    Dysuria    HPI: Lisa [name] is a 69 y.o. female with a PMHx of gross hematuria and hydronephrosis presents today for the evaluation and management of dysuria, lower abdomen pain, and feeling extreme fatigue.  Last cystoscopy on 06/07/2017.  She had an extensive evaluation in 2019 including CTU which showed 2 small renal calculi and resolution of right sided hydroureteronephrosis as well as unremarkable cysto.  +Urine culture 04/02/19 indicative of Enter.faecalis resistant to tetracycline 05/15/18 indicative of Staph. Epidermidis resistant to cipro, levofloxacin, penicillin 04/25/18 indicative of Enter.faecalis   PVR is 219 mL.  Today the patient is experiencing burning while she urinates, lower abdominal pain and feels extremely fatigued.   She has no flank pain.  She denies any fevers or chills.    PMH: Past Medical History:  Diagnosis Date  . CAD (coronary artery disease)    a. 1999 PTCA @ [facility]; b. NSTEMI/Cath 10/11/2016: LM 20%, oLAD 40%, p-mLAD 40%, dLAD 40%, p-mLCx 95%, pRCA 30%, 95/22m, EF 55-65%; c. 10/2016 CT Surgery->rec PCI due to poor lung fxn. d. 12/02/16 DES to mid RCA.  . Groin hematoma    a. 10/2016 Spont groin hematoma w/ anemia req 1u prbcs.  . High cholesterol   . History of kidney stones   . Mucus plugging of bronchi    a. 10/2016 RLL Bronchus obstruction->bronchus vs mass;  b. 10/2016 CT Chest: resolution of RLL mucous plug.  . NSTEMI (non-ST elevated myocardial infarction) ([place]) 10/11/2016  . PAF (paroxysmal atrial fibrillation) ([place])    a. 12/2016 Noted during diag cath--CHA2DS2VASc = 3. No OAC in setting of need for DAPT and h/o spont Thigh Hematoma in 10/2016.  Marland Kitchen Pericardial effusion    a. 10/2016 Echo: EF 60-65%, no rwma, Gr2 DD, small to mod  pericardial eff. No tamponade; b. 12/2016 Echo: EF 60-65%, no rwma, Gr1 DD, 1.2 to 1.4 cm mod to large circumferential pericardial effusion w/o tamponade; c. 12/2016 Echo: EF 55-60%, no rwma, triv effusion.  . Pneumonia 02/2016   "couple times before 02/2016 too" (12/02/2016)  . PVD (peripheral vascular disease) ([place])    a. Abdominal aortogram 10/11/16: Infrarenal abdominal aorta heavily calcified & ectatic with approximately 40-50% stenosis at the bifurcation. Mild diffuse disease w/ heavy calcification noted in the common & external iliac arteries bilaterally  . Type I diabetes mellitus ([place])    a. On insulin pump.(12/02/2016)  . Ventricular fibrillation ([place])    a. 10/11/2016: Felt to be secondary to demand ischemia in the setting of underlying CAD and anaphylactic reaction from IV Rocephin; b. 10/2016 Echo: EF 55-60%, ? mild inf HK; c. 10/2016 Seen by EP: No ICD indication.    Surgical History: Past Surgical History:  Procedure Laterality Date  . ABDOMINAL AORTOGRAM N/A 10/11/2016   Procedure: ABDOMINAL AORTOGRAM;  Surgeon: Nelva Bush, MD;  Location: [facility];  Service: Cardiovascular;  Laterality: N/A;  . CARDIAC CATHETERIZATION  10/2016  . CERVICAL BIOPSY  W/ LOOP ELECTRODE EXCISION  03/05/2012  . COLONOSCOPY    . COLONOSCOPY WITH PROPOFOL N/A 06/27/2018   Procedure: COLONOSCOPY WITH PROPOFOL;  Surgeon: Robert Bellow, MD;  Location: [facility];  Service: Endoscopy;  Laterality: N/A;  . COLPOSCOPY  01/17/2011  . CORONARY ANGIOPLASTY WITH STENT PLACEMENT  1999; 12/02/2016   "  1 stent  + 1 stent"  . CORONARY ATHERECTOMY N/A 12/02/2016   Procedure: CORONARY ATHERECTOMY - CSI;  Surgeon: Nelva Bush, MD;  Location: [facility];  Service: Cardiovascular;  Laterality: N/A;  . CORONARY STENT INTERVENTION N/A 12/02/2016   Procedure: CORONARY STENT INTERVENTION;  Surgeon: Nelva Bush, MD;  Location: [facility];  Service: Cardiovascular;  Laterality: N/A;    . LEFT HEART CATH AND CORONARY ANGIOGRAPHY N/A 10/11/2016   Procedure: LEFT HEART CATH AND CORONARY ANGIOGRAPHY;  Surgeon: Nelva Bush, MD;  Location: [facility];  Service: Cardiovascular;  Laterality: N/A;  . NASAL SINUS SURGERY  ~ 2009  . TEMPORARY PACEMAKER N/A 12/02/2016   Procedure: TEMPORARY PACEMAKER;  Surgeon: Nelva Bush, MD;  Location: [facility];  Service: Cardiovascular;  Laterality: N/A;  . TONSILLECTOMY      Home Medications:  Allergies as of 08/09/2019      Reactions   Ceftriaxone    Anaphylaxis, cardiac arrest   Penicillins Anaphylaxis   Rocephin [ceftriaxone Sodium In Dextrose] Anaphylaxis   Albuterol Other (See Comments)   Tachycardia-->Afib. Tolerates Xopenex      Medication List       Accurate as of August 09, 2019 11:59 PM. If you have any questions, ask your nurse or doctor.        ALPRAZolam 0.25 MG tablet Commonly known as: XANAX Take 0.25 mg by mouth 2 (two) times daily as needed (for anxiety.).   aspirin EC 81 MG tablet Take 81 mg by mouth daily.   atorvastatin 40 MG tablet Commonly known as: LIPITOR Take 40 mg by mouth at bedtime.   diltiazem 240 MG 24 hr capsule Commonly known as: CARDIZEM CD TAKE 1 CAPSULE BY MOUTH EVERY DAY   insulin aspart cartridge Commonly known as: NOVOLOG Inject into the skin 3 (three) times daily with meals.   nitrofurantoin (macrocrystal-monohydrate) 100 MG capsule Commonly known as: MACROBID Take 1 capsule (100 mg total) by mouth every 12 (twelve) hours. Started by: Hollice Espy, MD   nitroGLYCERIN 0.4 MG SL tablet Commonly known as: NITROSTAT Place 1 tablet (0.4 mg total) under the tongue every 5 (five) minutes x 3 doses as needed for chest pain.   OneTouch Verio test strip Generic drug: glucose blood USE TO TEST FOUR TIMES DAILY   Premarin vaginal cream Generic drug: conjugated estrogens Place 1 Applicatorful vaginally daily. Use pea sized amount M-W-Fr before bedtime    Prolia 60 MG/ML Sosy injection Generic drug: denosumab every 6 (six) months.   Vitamin D (Ergocalciferol) 1.25 MG (50000 UNIT) Caps capsule Commonly known as: DRISDOL Take 50,000 Units by mouth every 30 (thirty) days.       Allergies:  Allergies  Allergen Reactions  . Ceftriaxone     Anaphylaxis, cardiac arrest  . Penicillins Anaphylaxis  . Rocephin [Ceftriaxone Sodium In Dextrose] Anaphylaxis  . Albuterol Other (See Comments)    Tachycardia-->Afib. Tolerates Xopenex    Family History: Family History  Problem Relation Age of Onset  . Hypertension Mother   . Aortic aneurysm Father   . Hypertension Brother   . Lung disease Neg Hx   . Rheumatologic disease Neg Hx     Social History:  reports that she quit smoking about 2 years ago. Her smoking use included cigarettes. She started smoking about 50 years ago. She has a 11.75 pack-year smoking history. She has never used smokeless tobacco. She reports that she does not drink alcohol and does not use drugs.  Physical Exam: BP 137/77   Pulse 63   Ht 5\' 3"  (1.6 m)   Wt 139 lb (63 kg)   BMI 24.62 kg/m   Constitutional:  Alert and oriented, No acute distress. HEENT: [place] AT, moist mucus membranes.  Trachea midline, no masses. Cardiovascular: No clubbing, cyanosis, or edema. Respiratory: Normal respiratory effort, no increased work of breathing. Skin: No rashes, bruises or suspicious lesions. Neurologic: Grossly intact, no focal deficits, moving all 4 extremities. Psychiatric: Normal mood and affect.  Urinalysis Consistent with UTI infection.  Pertinent Imaging:  Results for orders placed during the hospital encounter of 05/07/19  Abdomen 1 view (KUB)  Narrative CLINICAL DATA:  Recurrent urinary tract infections. History of left renal stones.  EXAM: ABDOMEN - 1 VIEW  COMPARISON:  CT abdomen and pelvis 05/31/2017.  FINDINGS: The bowel gas pattern is normal. No radio-opaque calculi or other significant  radiographic abnormality are seen.  IMPRESSION: Negative exam.   Electronically Signed By: Inge Rise M.D. On: 05/07/2019 15:34   I have personally reviewed the images and agree with radiologist interpretation.     Assessment & Plan:    1. Recurrent UTI/ incomplete bladder emptying PVR  219 mL, may be contributing to recurrent infections, timed and double voiding Recommended topical estrogen cream and cranberry tablets    2. Acute cystitis Symtpomatic today + UA Macrobid 100 mg twice daily for 10 days, adjust based on culture and sensitivity data   [facility] [address] [place], [place] [address] [phone]  I, Selena Batten, am acting as a scribe for Dr. Hollice Espy.  I have reviewed the above documentation for accuracy and completeness, and I agree with the above.   Hollice Espy, MD

## 2019-08-12 ENCOUNTER — Telehealth: Payer: Self-pay | Admitting: *Deleted

## 2019-08-12 LAB — URINE CULTURE: Culture: >=100000 — AB

## 2019-08-12 NOTE — Telephone Encounter (Addendum)
Called [facility] lab-will add on sensitivities.   ----- Message from Hollice Espy, MD sent at 08/12/2019 12:16 PM EDT ----- Can you please call make sure that they do sensitivities on this?  It was done at: Lab, not [facility].  Hollice Espy, MD

## 2019-08-13 ENCOUNTER — Telehealth: Payer: Self-pay | Admitting: *Deleted

## 2019-08-13 NOTE — Telephone Encounter (Addendum)
Patient informed, voiced understanding.   ----- Message from Hollice Espy, MD sent at 08/13/2019  3:06 PM EDT ----- PRescribed abx appropriate.  Please let her know.    Hollice Espy, MD

## 2019-10-09 ENCOUNTER — Other Ambulatory Visit: Payer: Self-pay | Admitting: Internal Medicine

## 2019-10-09 MED ORDER — DILTIAZEM HCL ER COATED BEADS 240 MG PO CP24: ORAL_CAPSULE | ORAL | 0 refills | Status: DC

## 2019-10-09 NOTE — Telephone Encounter (Signed)
°*  STAT* If patient is at the pharmacy, call can be transferred to refill team.   1. Which medications need to be refilled? (please list name of each medication and dose if known) diltiazem 240 mg  2. Which pharmacy/location (including street and city if local pharmacy) is medication to be sent to? [facility] on the corner of [address]   3. Do they need a 30 day or 90 day supply? 90 day

## 2019-10-09 NOTE — Addendum Note (Signed)
Addended by: Raelene Bott, Tiki Tucciarone L on: 10/09/2019 03:29 PM   Modules accepted: Orders

## 2019-10-09 NOTE — Telephone Encounter (Signed)
Requested Prescriptions   Signed Prescriptions Disp Refills   diltiazem (CARDIZEM CD) 240 MG 24 hr capsule 90 capsule 0    Sig: TAKE 1 CAPSULE BY MOUTH EVERY DAY    Authorizing Provider: END, CHRISTOPHER    Ordering User: Britt Bottom

## 2019-10-22 NOTE — Progress Notes (Signed)
Follow-up Outpatient Visit Date: 10/23/2019  Primary Care Provider: Lenard Simmer, MD [address] [id number]  Chief Complaint: Follow-up coronary artery disease and PAD  HPI:  Lisa [name] is a 69 y.o. female with history of coronary artery disease status post remote coronary intervention at [place] (the late 90s) and cardiac arrest in the setting of anaphylaxis with catheterization demonstrating severe 2 vessel CAD(subsequent atherectomy and PCI to RCA),recurrent pericardial effusion, type1 diabetes mellitus, peripheral vascular disease with AAA, right lower lobe pneumonia with mucous plugging, and spontaneous right thigh hematoma in the setting of anticoagulation, who presents for follow-up of coronary artery disease.  I last saw her in March at which time she continued to feel well without chest pain, shortness of breath, and palpitations.  Today, Lisa [name] reports that she continues to do well.  She is walking regularly without any difficulty.  She has not had any chest pain, shortness of breath, palpitations, lightheadedness, edema, or claudication.  She notes that carotid Dopplers were recently performed through Dr. De Hollingshead office and is waiting on the results.  Blood sugar has been well controlled, with most recent hemoglobin A1c being 5.8%.  --------------------------------------------------------------------------------------------------  Cardiovascular History & Procedures: Cardiovascular Problems:  Coronary artery disease with VF arrest and transient complete heart block in the setting of anaphylactic shock  Recurrent pericardial effusion  Paroxysmal atrial fibrillation  Risk Factors:  Known coronary artery disease, peripheral vascular disease, and type 1 diabetes mellitus  Cath/PCI:  PCI (12/02/16): Successful orbital atherectomy and PCI to heavily calcified mid RCA disease with placement of a Xience Alpine 3.0 x 33 mm drug-eluting stent.  LHC  (10/11/16): Severe two-vessel coronary artery disease including heavily calcified 95% proximal/mid LCx and sequential 90-99% mid RCA stenoses. Mild to moderate, nonobstructive disease involving the LMCA and LAD. Basal inferior hypokinesis with overall preserved LV contraction. Mildly elevated LVEDP. Peripheral vascular disease with ectasia and calcification of the infrarenal abdominal aorta and iliac arteries.  Recent CV Pertinent Labs: Lab Results  Component Value Date   CHOL 81 10/13/2016   HDL 34 (L) 10/13/2016   LDLCALC 36 10/13/2016   TRIG 56 10/13/2016   CHOLHDL 2.4 10/13/2016   INR 0.94 11/23/2016   BNP 69.0 10/25/2016   K 4.2 05/13/2017   MG 1.6 (L) 12/08/2016   BUN 17 05/13/2017   CREATININE 0.85 05/13/2017    Past medical and surgical history were reviewed and updated in EPIC.  Current Meds  Medication Sig  . ALPRAZolam (XANAX) 0.25 MG tablet Take 0.25 mg by mouth 2 (two) times daily as needed (for anxiety.).   Marland Kitchen aspirin EC 81 MG tablet Take 81 mg by mouth daily.  Marland Kitchen atorvastatin (LIPITOR) 40 MG tablet Take 40 mg by mouth at bedtime.   . conjugated estrogens (PREMARIN) vaginal cream Place 1 Applicatorful vaginally daily. Use pea sized amount M-W-Fr before bedtime  . diltiazem (CARDIZEM CD) 240 MG 24 hr capsule TAKE 1 CAPSULE BY MOUTH EVERY DAY  . insulin aspart (NOVOLOG) cartridge Inject into the skin 3 (three) times daily with meals.  . nitrofurantoin, macrocrystal-monohydrate, (MACROBID) 100 MG capsule Take 1 capsule (100 mg total) by mouth every 12 (twelve) hours.  . nitroGLYCERIN (NITROSTAT) 0.4 MG SL tablet Place 1 tablet (0.4 mg total) under the tongue every 5 (five) minutes x 3 doses as needed for chest pain.  Glory Rosebush VERIO test strip USE TO TEST FOUR TIMES DAILY  . PROLIA 60 MG/ML SOSY injection every 6 (six) months.   Marland Kitchen  Vitamin D, Ergocalciferol, (DRISDOL) 50000 units CAPS capsule Take 50,000 Units by mouth every 30 (thirty) days.   . [DISCONTINUED] nitroGLYCERIN  (NITROSTAT) 0.4 MG SL tablet Place 1 tablet (0.4 mg total) under the tongue every 5 (five) minutes x 3 doses as needed for chest pain.    Allergies: Ceftriaxone, Penicillins, Rocephin [ceftriaxone sodium in dextrose], and Albuterol  Social History   Tobacco Use  . Smoking status: Former Smoker    Packs/day: 0.25    Years: 47.00    Pack years: 11.75    Types: Cigarettes    Start date: 01/25/1969    Quit date: 10/11/2016    Years since quitting: 3.0  . Smokeless tobacco: Never Used  . Tobacco comment: 12/16/2016 Wants to quit, has not set quit date.  Has been informed about resources available for smoking cessation  Vaping Use  . Vaping Use: Former  Substance Use Topics  . Alcohol use: No    Alcohol/week: 0.0 standard drinks  . Drug use: No    Family History  Problem Relation Age of Onset  . Hypertension Mother   . Aortic aneurysm Father   . Hypertension Brother   . Lung disease Neg Hx   . Rheumatologic disease Neg Hx     Review of Systems: A 12-system review of systems was performed and was negative except as noted in the HPI.  --------------------------------------------------------------------------------------------------  Physical Exam: BP 120/70 (BP Location: Left Arm, Patient Position: Sitting, Cuff Size: Normal)   Pulse 77   Ht 5\' 1"  (1.549 m)   Wt 138 lb 2 oz (62.7 kg)   BMI 26.10 kg/m   General: NAD. HEENT: No conjunctival pallor or scleral icterus. Facemask in place. Neck: No JVD or HJR. Lungs: Normal work of breathing. Clear to auscultation bilaterally without wheezes or crackles. Heart: Regular rate and rhythm without murmurs, rubs, or gallops. Abd: Bowel sounds present. Soft, NT/ND. Ext: No lower extremity edema.  EKG: Normal sinus rhythm without abnormality.  Lab Results  Component Value Date   WBC 6.9 05/13/2017   HGB 12.1 05/13/2017   HCT 36.8 05/13/2017   MCV 82.2 05/13/2017   PLT 265 05/13/2017    Lab Results  Component Value Date    NA 131 (L) 05/13/2017   K 4.2 05/13/2017   CL 100 (L) 05/13/2017   CO2 26 05/13/2017   BUN 17 05/13/2017   CREATININE 0.85 05/13/2017   GLUCOSE 94 05/13/2017   ALT 20 05/07/2017    Lab Results  Component Value Date   CHOL 81 10/13/2016   HDL 34 (L) 10/13/2016   LDLCALC 36 10/13/2016   TRIG 56 10/13/2016   CHOLHDL 2.4 10/13/2016    --------------------------------------------------------------------------------------------------  ASSESSMENT AND PLAN: Coronary artery disease: Lisa [name] continues to do well without angina despite having significant CAD with known chronic subtotal occlusion of the LCx.  We have discussed noninvasive ischemia testing for further evaluation, but given her lack of symptoms and likely abnormal stress test given her known coronary anatomy, we have agreed to defer this.  We will continue indefinite aspirin 81 mg daily as well as high intensity statin therapy.  Paroxysmal atrial fibrillation: No palpitations reported.  EKG again shows sinus rhythm.  Continue current dose of diltiazem.  Defer anticoagulation given isolated atrial fibrillation in the setting of anaphylaxis/cardiac arrest and spontaneous thigh hematoma while on heparin.  Hyperlipidemia: Continue atorvastatin 40 mg daily.  Ongoing lipid monitoring per Dr. Francoise Schaumann with target LDL less than 70.  Peripheral vascular disease: Mildly  dilated abdominal aorta noted by duplex scan earlier this year.  Plan for 1 year follow-up in the spring.  Continue aspirin and statin therapy for secondary prevention.  Defer follow-up/management of possible cerebrovascular disease to Dr. Ronnald Collum.  Follow-up: Return to clinic in 6 months.  Nelva Bush, MD 10/24/2019 7:35 AM

## 2019-10-23 ENCOUNTER — Encounter: Payer: Self-pay | Admitting: Internal Medicine

## 2019-10-23 ENCOUNTER — Ambulatory Visit: Payer: Medicare HMO | Admitting: Internal Medicine

## 2019-10-23 ENCOUNTER — Other Ambulatory Visit: Payer: Self-pay

## 2019-10-23 VITALS — BP 120/70 | HR 77 | Ht 61.0 in | Wt 138.1 lb

## 2019-10-23 DIAGNOSIS — I739 Peripheral vascular disease, unspecified: Secondary | ICD-10-CM | POA: Diagnosis not present

## 2019-10-23 DIAGNOSIS — I2510 Atherosclerotic heart disease of native coronary artery without angina pectoris: Secondary | ICD-10-CM | POA: Diagnosis not present

## 2019-10-23 DIAGNOSIS — E785 Hyperlipidemia, unspecified: Secondary | ICD-10-CM

## 2019-10-23 DIAGNOSIS — I480 Paroxysmal atrial fibrillation: Secondary | ICD-10-CM

## 2019-10-23 MED ORDER — NITROGLYCERIN 0.4 MG SL SUBL: 0.4000 mg | SUBLINGUAL_TABLET | SUBLINGUAL | 6 refills | Status: DC | PRN

## 2019-10-23 NOTE — Patient Instructions (Signed)
Medication Instructions:  Your physician recommends that you continue on your current medications as directed. Please refer to the Current Medication list given to you today.  *If you need a refill on your cardiac medications before your next appointment, please call your pharmacy*  Follow-Up: At [facility], you and your health needs are our priority.  As part of our continuing mission to provide you with exceptional heart care, we have created designated Provider Care Teams.  These Care Teams include your primary Cardiologist (physician) and Advanced Practice Providers (APPs -  Physician Assistants and Nurse Practitioners) who all work together to provide you with the care you need, when you need it.  We recommend signing up for the patient portal called "MyChart".  Sign up information is provided on this After Visit Summary.  MyChart is used to connect with patients for Virtual Visits (Telemedicine).  Patients are able to view lab/test results, encounter notes, upcoming appointments, etc.  Non-urgent messages can be sent to your provider as well.   To learn more about what you can do with MyChart, go to [email or web address].    Your next appointment:   6 month(s)  The format for your next appointment:   In Person  Provider:   You may see Christopher End, MD or one of the following Advanced Practice Providers on your designated Care Team:    Christopher Berge, NP  Ryan Dunn, PA-C  Jacquelyn Visser, PA-C  Cadence Furth, PA-C   

## 2019-10-24 ENCOUNTER — Encounter: Payer: Self-pay | Admitting: Internal Medicine

## 2020-03-16 ENCOUNTER — Encounter: Payer: Self-pay | Admitting: Gastroenterology

## 2020-03-16 ENCOUNTER — Ambulatory Visit: Payer: Medicare HMO | Admitting: Gastroenterology

## 2020-03-16 ENCOUNTER — Other Ambulatory Visit: Payer: Self-pay

## 2020-03-16 VITALS — BP 130/64 | HR 69 | Temp 97.3°F | Ht 61.0 in | Wt 137.6 lb

## 2020-03-16 DIAGNOSIS — R151 Fecal smearing: Secondary | ICD-10-CM | POA: Diagnosis not present

## 2020-03-16 DIAGNOSIS — K5900 Constipation, unspecified: Secondary | ICD-10-CM

## 2020-03-16 NOTE — Progress Notes (Signed)
Gastroenterology Consultation  Referring Provider:     Lenard Simmer, MD Primary Care Physician:  Lenard Simmer, MD Primary Gastroenterologist:  Dr. Allen Norris     Reason for Consultation:     Incontinence of stool        HPI:   Lisa [name] is a 70 y.o. y/o female referred for consultation & management of Incontinence of stool by Dr. Ronnald Collum, Lourdes Sledge, MD.  This patient comes in with a history of having a colonoscopy in 2020 by Dr. Bary Castilla with an adenomatous rectal polyp found.  The patient was sent by her primary care provider now for stool incontinence. After her subcentimeter polyp was removed from the rectum in May 2020 the patient was brought back 1 month later with repeat surgical pathology showing a hyperplastic polyp.  The patient reports that she will get constipated and will not move her bowels for 4-5 days and then she'll take a laxative and then she has diarrhea with incontinence.  She reports her diarrhea to be more of a paced the soft stool that it is watery.  She then stopped all medication and then starts to have constipation again.  She also reports that she has been constipated since she is a young child and reports that her mother will use to give her things to help with her constipation but now she is wearing protection due to her incontinence.  There is no report of any unexplained weight loss fevers chills nausea vomiting black stools or bloody stools.   Past Medical History:  Diagnosis Date  . CAD (coronary artery disease)    a. 1999 PTCA @ [facility]; b. NSTEMI/Cath 10/11/2016: LM 20%, oLAD 40%, p-mLAD 40%, dLAD 40%, p-mLCx 95%, pRCA 30%, 95/87m, EF 55-65%; c. 10/2016 CT Surgery->rec PCI due to poor lung fxn. d. 12/02/16 DES to mid RCA.  . Groin hematoma    a. 10/2016 Spont groin hematoma w/ anemia req 1u prbcs.  . High cholesterol   . History of kidney stones   . Mucus plugging of bronchi    a. 10/2016 RLL Bronchus obstruction->bronchus vs mass;  b. 10/2016 CT  Chest: resolution of RLL mucous plug.  . NSTEMI (non-ST elevated myocardial infarction) ([place]) 10/11/2016  . PAF (paroxysmal atrial fibrillation) ([place])    a. 12/2016 Noted during diag cath--CHA2DS2VASc = 3. No OAC in setting of need for DAPT and h/o spont Thigh Hematoma in 10/2016.  Marland Kitchen Pericardial effusion    a. 10/2016 Echo: EF 60-65%, no rwma, Gr2 DD, small to mod pericardial eff. No tamponade; b. 12/2016 Echo: EF 60-65%, no rwma, Gr1 DD, 1.2 to 1.4 cm mod to large circumferential pericardial effusion w/o tamponade; c. 12/2016 Echo: EF 55-60%, no rwma, triv effusion.  . Pneumonia 02/2016   "couple times before 02/2016 too" (12/02/2016)  . PVD (peripheral vascular disease) ([place])    a. Abdominal aortogram 10/11/16: Infrarenal abdominal aorta heavily calcified & ectatic with approximately 40-50% stenosis at the bifurcation. Mild diffuse disease w/ heavy calcification noted in the common & external iliac arteries bilaterally  . Type I diabetes mellitus ([place])    a. On insulin pump.(12/02/2016)  . Ventricular fibrillation ([place])    a. 10/11/2016: Felt to be secondary to demand ischemia in the setting of underlying CAD and anaphylactic reaction from IV Rocephin; b. 10/2016 Echo: EF 55-60%, ? mild inf HK; c. 10/2016 Seen by EP: No ICD indication.    Past Surgical History:  Procedure Laterality Date  . ABDOMINAL AORTOGRAM N/A  10/11/2016   Procedure: ABDOMINAL AORTOGRAM;  Surgeon: Nelva Bush, MD;  Location: [place];  Service: Cardiovascular;  Laterality: N/A;  . CARDIAC CATHETERIZATION  10/2016  . CERVICAL BIOPSY  W/ LOOP ELECTRODE EXCISION  03/05/2012  . COLONOSCOPY    . COLONOSCOPY WITH PROPOFOL N/A 06/27/2018   Procedure: COLONOSCOPY WITH PROPOFOL;  Surgeon: Robert Bellow, MD;  Location: [facility];  Service: Endoscopy;  Laterality: N/A;  . COLPOSCOPY  01/17/2011  . CORONARY ANGIOPLASTY WITH STENT PLACEMENT  1999; 12/02/2016   "1 stent  + 1 stent"  . CORONARY ATHERECTOMY N/A  12/02/2016   Procedure: CORONARY ATHERECTOMY - CSI;  Surgeon: Nelva Bush, MD;  Location: [facility];  Service: Cardiovascular;  Laterality: N/A;  . CORONARY STENT INTERVENTION N/A 12/02/2016   Procedure: CORONARY STENT INTERVENTION;  Surgeon: Nelva Bush, MD;  Location: [place] [facility];  Service: Cardiovascular;  Laterality: N/A;  . LEFT HEART CATH AND CORONARY ANGIOGRAPHY N/A 10/11/2016   Procedure: LEFT HEART CATH AND CORONARY ANGIOGRAPHY;  Surgeon: Nelva Bush, MD;  Location: [place] [facility];  Service: Cardiovascular;  Laterality: N/A;  . NASAL SINUS SURGERY  ~ 2009  . TEMPORARY PACEMAKER N/A 12/02/2016   Procedure: TEMPORARY PACEMAKER;  Surgeon: Nelva Bush, MD;  Location: [place] [facility];  Service: Cardiovascular;  Laterality: N/A;  . TONSILLECTOMY      Prior to Admission medications   Medication Sig Start Date End Date Taking? Authorizing Provider  ALPRAZolam (XANAX) 0.25 MG tablet Take 0.25 mg by mouth 2 (two) times daily as needed (for anxiety.).     [provider]  aspirin EC 81 MG tablet Take 81 mg by mouth daily.    [provider]  atorvastatin (LIPITOR) 40 MG tablet Take 40 mg by mouth at bedtime.     [provider]  conjugated estrogens (PREMARIN) vaginal cream Place 1 Applicatorful vaginally daily. Use pea sized amount M-W-Fr before bedtime 05/07/19   Hollice Espy, MD  diltiazem (CARDIZEM CD) 240 MG 24 hr capsule TAKE 1 CAPSULE BY MOUTH EVERY DAY 10/09/19   End, Harrell Gave, MD  insulin aspart (NOVOLOG) cartridge Inject into the skin 3 (three) times daily with meals.    [provider]  nitrofurantoin, macrocrystal-monohydrate, (MACROBID) 100 MG capsule Take 1 capsule (100 mg total) by mouth every 12 (twelve) hours. 08/09/19   Hollice Espy, MD  nitroGLYCERIN (NITROSTAT) 0.4 MG SL tablet Place 1 tablet (0.4 mg total) under the tongue every 5 (five) minutes x 3 doses as needed for chest pain. 10/23/19   End,  Harrell Gave, MD  [facility] VERIO test strip USE TO TEST FOUR TIMES DAILY 04/26/19   [provider]  PROLIA 60 MG/ML SOSY injection every 6 (six) months.  07/27/17   [provider]  Vitamin D, Ergocalciferol, (DRISDOL) 50000 units CAPS capsule Take 50,000 Units by mouth every 30 (thirty) days.     [provider]    Family History  Problem Relation Age of Onset  . Hypertension Mother   . Aortic aneurysm Father   . Hypertension Brother   . Lung disease Neg Hx   . Rheumatologic disease Neg Hx      Social History   Tobacco Use  . Smoking status: Former Smoker    Packs/day: 0.25    Years: 47.00    Pack years: 11.75    Types: Cigarettes    Start date: 01/25/1969    Quit date: 10/11/2016    Years since quitting: 3.4  .  Smokeless tobacco: Never Used  . Tobacco comment: 12/16/2016 Wants to quit, has not set quit date.  Has been informed about resources available for smoking cessation  Vaping Use  . Vaping Use: Former  Substance Use Topics  . Alcohol use: No    Alcohol/week: 0.0 standard drinks  . Drug use: No    Allergies as of 03/16/2020 - Review Complete 10/24/2019  Allergen Reaction Noted  . Ceftriaxone  10/11/2016  . Penicillins Anaphylaxis 05/07/2017  . Rocephin [ceftriaxone sodium in dextrose] Anaphylaxis 10/25/2016  . Albuterol Other (See Comments) 10/26/2016    Review of Systems:    All systems reviewed and negative except where noted in HPI.   Physical Exam:  There were no vitals taken for this visit. No LMP recorded. Patient is postmenopausal. General:   Alert,  Well-developed, well-nourished, pleasant and cooperative in NAD Head:  Normocephalic and atraumatic. Eyes:  Sclera clear, no icterus.   Conjunctiva pink. Ears:  Normal auditory acuity. Neck:  Supple; no masses or thyromegaly. Lungs:  Respirations even and unlabored.  Clear throughout to auscultation.   No wheezes, crackles, or rhonchi. No acute distress. Heart:  Regular rate  and rhythm; no murmurs, clicks, rubs, or gallops. Abdomen:  Normal bowel sounds.  No bruits.  Soft, non-tender and non-distended without masses, hepatosplenomegaly or hernias noted.  No guarding or rebound tenderness.  Negative Carnett sign.   Rectal:  Deferred.  Pulses:  Normal pulses noted. Extremities:  No clubbing or edema.  No cyanosis. Neurologic:  Alert and oriented x3;  grossly normal neurologically. Skin:  Intact without significant lesions or rashes.  No jaundice. Lymph Nodes:  No significant cervical adenopathy. Psych:  Alert and cooperative. Normal mood and affect.  Imaging Studies: No results found.  Assessment and Plan:   ALEENA KIRKEBY is a 70 y.o. y/o female who comes in today with a history of chronic constipation and she intermittently treats herself with stool softeners to have a bowel movement.  She then states that she has stool incontinence.  She also is concerned because she states she does not feel the soft stools being passed and does not know when she is having incontinence. The patient has been told to titrate her MiraLAX and to take it once a day so that she has a formed bowel movement every day to every other day. She has also been told that if this does not occur with the MiraLAX that she should add Citrucel or Metamucil fiber supplementation.  She has also been told that if this does not work for her that she should contact me through my chart and she may need to be started on Linzess.  The patient has been explained the plan and agrees with it.    Lucilla Lame, MD. Marval Regal    Note: This dictation was prepared with Dragon dictation along with smaller phrase technology. Any transcriptional errors that result from this process are unintentional.

## 2020-04-06 ENCOUNTER — Other Ambulatory Visit: Payer: Self-pay

## 2020-04-06 MED ORDER — DILTIAZEM HCL ER COATED BEADS 240 MG PO CP24: ORAL_CAPSULE | ORAL | 0 refills | Status: DC

## 2020-04-06 NOTE — Telephone Encounter (Signed)
*  STAT* If patient is at the pharmacy, call can be transferred to refill team.   1. Which medications need to be refilled? (please list name of each medication and dose if known) Diltiazem  2. Which pharmacy/location (including street and city if local pharmacy) is medication to be sent to? [facility] [address]  3. Do they need a 30 day or 90 day supply? 90  

## 2020-04-22 ENCOUNTER — Other Ambulatory Visit: Payer: Self-pay

## 2020-04-22 ENCOUNTER — Encounter: Payer: Self-pay | Admitting: Internal Medicine

## 2020-04-22 ENCOUNTER — Ambulatory Visit: Payer: Medicare HMO | Admitting: Internal Medicine

## 2020-04-22 VITALS — BP 112/60 | HR 63 | Ht 61.0 in | Wt 134.0 lb

## 2020-04-22 DIAGNOSIS — I714 Abdominal aortic aneurysm, without rupture, unspecified: Secondary | ICD-10-CM

## 2020-04-22 DIAGNOSIS — I480 Paroxysmal atrial fibrillation: Secondary | ICD-10-CM

## 2020-04-22 DIAGNOSIS — E785 Hyperlipidemia, unspecified: Secondary | ICD-10-CM

## 2020-04-22 DIAGNOSIS — I2510 Atherosclerotic heart disease of native coronary artery without angina pectoris: Secondary | ICD-10-CM

## 2020-04-22 NOTE — Patient Instructions (Signed)

## 2020-04-22 NOTE — Progress Notes (Signed)
Follow-up Outpatient Visit Date: 04/22/2020  Primary Care Provider: Lenard Simmer, MD [address] [id number]  Chief Complaint: Follow-up coronary artery disease  HPI:  Lisa [name] is a 70 y.o. female with history of coronary artery disease status post remote coronary intervention at [facility] (the late 90s) and cardiac arrest in the setting of anaphylaxis with catheterization demonstrating severe 2-vessel CAD(subsequent atherectomy and PCI to RCA),recurrent pericardial effusion, type1 diabetes mellitus, peripheral vascular disease with AAA, right lower lobe pneumonia with mucous plugging, and spontaneous right thigh hematoma in the setting of anticoagulation, who presents for follow-up of coronary artery disease.  I last saw her in 10/2019, at which time she was doing well without any dyspnea or chest pain.  We deferred medication changes and additional testing.  Ms. Winker is feeling well again today, denying chest pain, shortness of breath, palpitations, lightheadedness, and edema.  She is exercising at the gym at least 3 days a week.  She was recently started on metoprolol by her PCP for management of chronic tremor.  This has improved but not completely resolved.  --------------------------------------------------------------------------------------------------  Cardiovascular History & Procedures: Cardiovascular Problems:  Coronary artery disease with VF arrest and transient complete heart block in the setting of anaphylactic shock  Recurrent pericardial effusion  Paroxysmal atrial fibrillation  Risk Factors:  Known coronary artery disease, peripheral vascular disease, and type 1 diabetes mellitus  Cath/PCI:  PCI (12/02/16): Successful orbital atherectomy and PCI to heavily calcified mid RCA disease with placement of a Xience Alpine 3.0 x 33 mm drug-eluting stent.  LHC (10/11/16): Severe two-vessel coronary artery disease including heavily calcified 95%  proximal/mid LCx and sequential 90-99% mid RCA stenoses. Mild to moderate, nonobstructive disease involving the LMCA and LAD. Basal inferior hypokinesis with overall preserved LV contraction. Mildly elevated LVEDP. Peripheral vascular disease with ectasia and calcification of the infrarenal abdominal aorta and iliac arteries.  Recent CV Pertinent Labs: Lab Results  Component Value Date   CHOL 81 10/13/2016   HDL 34 (L) 10/13/2016   LDLCALC 36 10/13/2016   TRIG 56 10/13/2016   CHOLHDL 2.4 10/13/2016   INR 0.94 11/23/2016   BNP 69.0 10/25/2016   K 4.2 05/13/2017   MG 1.6 (L) 12/08/2016   BUN 17 05/13/2017   CREATININE 0.85 05/13/2017    Past medical and surgical history were reviewed and updated in EPIC.  Current Meds  Medication Sig  . ALPRAZolam (XANAX) 0.25 MG tablet Take 0.25 mg by mouth 2 (two) times daily as needed (for anxiety.).   Marland Kitchen aspirin EC 81 MG tablet Take 81 mg by mouth daily.  Marland Kitchen atorvastatin (LIPITOR) 40 MG tablet Take 40 mg by mouth at bedtime.   . conjugated estrogens (PREMARIN) vaginal cream Place 1 Applicatorful vaginally daily. Use pea sized amount M-W-Fr before bedtime  . diltiazem (CARDIZEM CD) 240 MG 24 hr capsule TAKE 1 CAPSULE BY MOUTH EVERY DAY  . insulin aspart (NOVOLOG) cartridge Inject into the skin 3 (three) times daily with meals.  . metoprolol succinate (TOPROL-XL) 25 MG 24 hr tablet Take 25 mg by mouth daily.  . nitroGLYCERIN (NITROSTAT) 0.4 MG SL tablet Place 1 tablet (0.4 mg total) under the tongue every 5 (five) minutes x 3 doses as needed for chest pain.  Glory Rosebush VERIO test strip USE TO TEST FOUR TIMES DAILY  . PROLIA 60 MG/ML SOSY injection every 6 (six) months.   . Vitamin D, Ergocalciferol, (DRISDOL) 50000 units CAPS capsule Take 50,000 Units by mouth every 30 (  thirty) days.     Allergies: Ceftriaxone, Penicillins, Rocephin [ceftriaxone sodium in dextrose], and Albuterol  Social History   Tobacco Use  . Smoking status: Former Smoker     Packs/day: 0.25    Years: 47.00    Pack years: 11.75    Types: Cigarettes    Start date: 01/25/1969    Quit date: 10/11/2016    Years since quitting: 3.5  . Smokeless tobacco: Never Used  . Tobacco comment: 12/16/2016 Wants to quit, has not set quit date.  Has been informed about resources available for smoking cessation  Vaping Use  . Vaping Use: Former  Substance Use Topics  . Alcohol use: No    Alcohol/week: 0.0 standard drinks  . Drug use: No    Family History  Problem Relation Age of Onset  . Hypertension Mother   . Aortic aneurysm Father   . Hypertension Brother   . Lung disease Neg Hx   . Rheumatologic disease Neg Hx     Review of Systems: A 12-system review of systems was performed and was negative except as noted in the HPI.  --------------------------------------------------------------------------------------------------  Physical Exam: BP 112/60 (BP Location: Left Arm, Patient Position: Sitting, Cuff Size: Normal)   Pulse 63   Ht 5\' 1"  (1.549 m)   Wt 134 lb (60.8 kg)   SpO2 98%   BMI 25.32 kg/m   General:  NAD. Neck: No JVD or HJR. Lungs: Clear to auscultation bilaterally without wheezes or crackles. Heart: Regular rate and rhythm without murmurs, rubs, or gallops. Abdomen: Soft, nontender, nondistended. Extremities: No lower extremity edema.  EKG: Normal sinus rhythm with early R wave transition and is more pronounced from prior tracing on 10/23/2019.  Otherwise, no significant interval change.  Lab Results  Component Value Date   WBC 6.9 05/13/2017   HGB 12.1 05/13/2017   HCT 36.8 05/13/2017   MCV 82.2 05/13/2017   PLT 265 05/13/2017    Lab Results  Component Value Date   NA 131 (L) 05/13/2017   K 4.2 05/13/2017   CL 100 (L) 05/13/2017   CO2 26 05/13/2017   BUN 17 05/13/2017   CREATININE 0.85 05/13/2017   GLUCOSE 94 05/13/2017   ALT 20 05/07/2017    Lab Results  Component Value Date   CHOL 81 10/13/2016   HDL 34 (L) 10/13/2016    LDLCALC 36 10/13/2016   TRIG 56 10/13/2016   CHOLHDL 2.4 10/13/2016    --------------------------------------------------------------------------------------------------  ASSESSMENT AND PLAN: Coronary artery disease: Ms. Pegues continues to do remarkably well in spite of her significant multivessel CAD status post PCI to the RCA in 2018.  Given her excellent functional status, we have agreed to defer additional testing and continue her current medications for secondary prevention.  AAA: Mildly dilated abdominal aorta noted on prior imaging, most recently ultrasound in 05/2019.  Annual duplex due next month.  Continue aspirin and statin therapy.  Paroxysmal atrial fibrillation: No symptoms to suggest recurrent atrial fibrillation reported.  EKG again shows sinus rhythm.  Given history of spontaneous thigh hematoma in the past and isolated atrial fibrillation in the setting of MI/cardiac arrest, we will continue to defer anticoagulation.  Hyperlipidemia: LDL at goal on last check by Dr. Ronnald Collum in 01/2020 (LDL 66, triglycerides 148).  Continue grams daily.  Follow-up: Return to clinic in 6 months.  Nelva Bush, MD 04/23/2020 7:46 AM

## 2020-04-23 ENCOUNTER — Telehealth: Payer: Self-pay | Admitting: *Deleted

## 2020-04-23 ENCOUNTER — Encounter: Payer: Self-pay | Admitting: Internal Medicine

## 2020-04-23 DIAGNOSIS — I714 Abdominal aortic aneurysm, without rupture, unspecified: Secondary | ICD-10-CM | POA: Insufficient documentation

## 2020-04-23 NOTE — Telephone Encounter (Signed)
Patient does not have schedule book available at this time Will call back to schedule

## 2020-04-23 NOTE — Telephone Encounter (Signed)
-----   Message from Nelva Bush, MD sent at 04/23/2020  7:53 AM EDT ----- Claris Gladden,  I noticed that Ms. Schuman is due for her annual AAA ultrasound, which was ordered last year but does not appear to have been scheduled yet.  Would you mind having her schedule this at her convenience?  Thanks.  Gerald Stabs

## 2020-04-23 NOTE — Telephone Encounter (Signed)
Scheduling,  Do you mind reaching out to have this scheduled for patient? The order should still be good.  Thanks!

## 2020-05-08 NOTE — Telephone Encounter (Signed)
Order in [place] .  Closing this encounter.

## 2020-06-25 ENCOUNTER — Ambulatory Visit (INDEPENDENT_AMBULATORY_CARE_PROVIDER_SITE_OTHER): Payer: Medicare HMO

## 2020-06-25 ENCOUNTER — Other Ambulatory Visit: Payer: Self-pay

## 2020-06-25 DIAGNOSIS — I714 Abdominal aortic aneurysm, without rupture, unspecified: Secondary | ICD-10-CM

## 2020-07-02 ENCOUNTER — Other Ambulatory Visit: Payer: Self-pay | Admitting: Internal Medicine

## 2020-09-27 ENCOUNTER — Other Ambulatory Visit: Payer: Self-pay | Admitting: Internal Medicine

## 2020-10-26 NOTE — Progress Notes (Signed)
Follow-up Outpatient Visit Date: 10/28/2020  Primary Care Provider: Lenard Simmer, MD [address] [id number]  Chief Complaint: Follow-up coronary artery disease  HPI:  Lisa [name] is a 70 y.o. female with history of  coronary artery disease status post remote coronary intervention at [place] (the late 90s) and cardiac arrest in the setting of anaphylaxis with catheterization demonstrating severe 2-vessel CAD (subsequent atherectomy and PCI to RCA), recurrent pericardial effusion, type 1 diabetes mellitus, peripheral vascular disease with AAA, right lower lobe pneumonia with mucous plugging, and spontaneous right thigh hematoma in the setting of anticoagulation with heparin, who presents for follow-up of coronary artery disease.  I last saw Lisa [name] in March, at which time she continued to do well.  Subsequent AAA duplex showed stable mild enlargement of the abdominal aorta (2.7 cm).  No other testing or intervention was undertaken.  Lisa [name] continues to do well, denying chest pain, shortness of breath, palpitations, lightheadedness, edema, and claudication.  She does both cardio and resistance exercise several days a week without any limitations.  Home blood pressures are typically normal, similar to today's reading in the office.  She happily reports that her blood sugars have also been well controlled with a recent hemoglobin A1c of 5.9.  --------------------------------------------------------------------------------------------------  Cardiovascular History & Procedures: Cardiovascular Problems: Coronary artery disease with VF arrest and transient complete heart block in the setting of anaphylactic shock Recurrent pericardial effusion Paroxysmal atrial fibrillation   Risk Factors: Known coronary artery disease, peripheral vascular disease, and type 1 diabetes mellitus   Cath/PCI: PCI (12/02/16): Successful orbital atherectomy and PCI to heavily calcified mid RCA  disease with placement of a Xience Alpine 3.0 x 33 mm drug-eluting stent. LHC (10/11/16): Severe two-vessel coronary artery disease including heavily calcified 95% proximal/mid LCx and sequential 90-99% mid RCA stenoses. Mild to moderate, nonobstructive disease involving the LMCA and LAD. Basal inferior hypokinesis with overall preserved LV contraction. Mildly elevated LVEDP. Peripheral vascular disease with ectasia and calcification of the infrarenal abdominal aorta and iliac arteries.   CV Surgery: None   EP Procedures and Devices: None   Non-Invasive Evaluation(s): AAA duplex (06/25/2020): Stable mild dilation of proximal and mid abdominal aorta, measuring up to 2.7 cm. Limited TTE (01/19/17): Trivial pericardial effusion, significantly decreased in size from prior study. LVEF 55-60%. Limited TTE (12/13/16): Normal LV size with LVEF of 65-70%.  Normal RV size and function.  Moderate to large circumferential pericardial effusion, stable to slightly decreased in size compared with 12/09/16.  No evidence of tamponade physiology. TTE (10/25/16): Normal LV size with mild to moderate LVH. LVEF 60-65% with grade 2 diastolic dysfunction. RV size and function. Normal PA pressure. Small-to-moderate pericardial effusion. TTE (10/12/16): Normal LV size. LVEF 55-60%. Normal diastolic function. Normal RV size and function. Normal PA pressure.  Recent CV Pertinent Labs: Lab Results  Component Value Date   CHOL 81 10/13/2016   HDL 34 (L) 10/13/2016   LDLCALC 36 10/13/2016   TRIG 56 10/13/2016   CHOLHDL 2.4 10/13/2016   INR 0.94 11/23/2016   BNP 69.0 10/25/2016   K 4.2 05/13/2017   MG 1.6 (L) 12/08/2016   BUN 17 05/13/2017   CREATININE 0.85 05/13/2017    Past medical and surgical history were reviewed and updated in EPIC.  Current Meds  Medication Sig   ALPRAZolam (XANAX) 0.25 MG tablet Take 0.25 mg by mouth 2 (two) times daily as needed (for anxiety.).    aspirin EC 81 MG tablet Take 81 mg  by  mouth daily.   atorvastatin (LIPITOR) 40 MG tablet Take 40 mg by mouth at bedtime.    conjugated estrogens (PREMARIN) vaginal cream Place 1 Applicatorful vaginally daily. Use pea sized amount M-W-Fr before bedtime   diltiazem (CARDIZEM CD) 240 MG 24 hr capsule TAKE 1 CAPSULE BY MOUTH EVERY DAY   insulin aspart (NOVOLOG) cartridge Inject into the skin 3 (three) times daily with meals.   nitroGLYCERIN (NITROSTAT) 0.4 MG SL tablet Place 1 tablet (0.4 mg total) under the tongue every 5 (five) minutes x 3 doses as needed for chest pain.   ONETOUCH VERIO test strip USE TO TEST FOUR TIMES DAILY   PROLIA 60 MG/ML SOSY injection every 6 (six) months.    Vitamin D, Ergocalciferol, (DRISDOL) 50000 units CAPS capsule Take 50,000 Units by mouth every 30 (thirty) days.     Allergies: Ceftriaxone, Penicillins, Rocephin [ceftriaxone sodium in dextrose], and Albuterol  Social History   Tobacco Use   Smoking status: Former    Packs/day: 0.25    Years: 47.00    Pack years: 11.75    Types: Cigarettes    Start date: 01/25/1969    Quit date: 10/11/2016    Years since quitting: 4.0   Smokeless tobacco: Never  Vaping Use   Vaping Use: Former  Substance Use Topics   Alcohol use: No    Alcohol/week: 0.0 standard drinks   Drug use: No    Family History  Problem Relation Age of Onset   Hypertension Mother    Aortic aneurysm Father    Hypertension Brother    Lung disease Neg Hx    Rheumatologic disease Neg Hx     Review of Systems: A 12-system review of systems was performed and was negative except as noted in the HPI.  --------------------------------------------------------------------------------------------------  Physical Exam: BP 122/76 (BP Location: Left Arm, Patient Position: Sitting, Cuff Size: Normal)   Pulse 80   Ht 5\' 1"  (1.549 m)   Wt 134 lb (60.8 kg)   SpO2 97%   BMI 25.32 kg/m   General:  NAD. Neck: No JVD or HJR. Lungs: Clear to auscultation bilaterally without wheezes or  crackles. Heart: Regular rate and rhythm without murmurs, rubs, or gallops. Abdomen: Soft, nontender, nondistended. Extremities: No lower extremity edema.  EKG: Normal sinus rhythm without abnormality.  Lab Results  Component Value Date   WBC 6.9 05/13/2017   HGB 12.1 05/13/2017   HCT 36.8 05/13/2017   MCV 82.2 05/13/2017   PLT 265 05/13/2017    Lab Results  Component Value Date   NA 131 (L) 05/13/2017   K 4.2 05/13/2017   CL 100 (L) 05/13/2017   CO2 26 05/13/2017   BUN 17 05/13/2017   CREATININE 0.85 05/13/2017   GLUCOSE 94 05/13/2017   ALT 20 05/07/2017    Lab Results  Component Value Date   CHOL 81 10/13/2016   HDL 34 (L) 10/13/2016   LDLCALC 36 10/13/2016   TRIG 56 10/13/2016   CHOLHDL 2.4 10/13/2016    --------------------------------------------------------------------------------------------------  ASSESSMENT AND PLAN: Coronary artery disease: Lisa [name] does not have any symptoms to suggest worsening coronary insufficiency though she has known residual moderate LAD and severe LCx disease.  We discussed role for noninvasive ischemia testing but have agreed to defer this given that she is asymptomatic with good functional capacity.  I advised her to alert [place] if she has any decline in her functional capacity or new symptoms that could suggest progression of her CAD.  We will  continue secondary prevention with aspirin and atorvastatin.  AAA: Minimal enlargement of abdominal aorta again noted on most recent duplex, stable.  Continue secondary prevention and periodic ultrasound follow-up.  Paroxysmal atrial fibrillation: No evidence of recurrence, with only episode occurring in the setting of cardiac arrest in 2018.  Defer anticoagulation in the setting of self-limited atrial fibrillation and history of spontaneous thigh hematoma while on heparin with continuation of low-dose diltiazem.  Hyperlipidemia associated with type 1 diabetes mellitus: Continue atorvastatin  for target LDL less than 70.  We will request most recent lipid panel from Dr. De Hollingshead office.  Follow-up: Return to clinic in 6 months.  Nelva Bush, MD 10/28/2020 9:01 AM

## 2020-10-28 ENCOUNTER — Ambulatory Visit: Payer: Medicare HMO | Admitting: Internal Medicine

## 2020-10-28 ENCOUNTER — Other Ambulatory Visit: Payer: Self-pay

## 2020-10-28 ENCOUNTER — Encounter: Payer: Self-pay | Admitting: Internal Medicine

## 2020-10-28 VITALS — BP 122/76 | HR 80 | Ht 61.0 in | Wt 134.0 lb

## 2020-10-28 DIAGNOSIS — I2510 Atherosclerotic heart disease of native coronary artery without angina pectoris: Secondary | ICD-10-CM | POA: Diagnosis not present

## 2020-10-28 DIAGNOSIS — E782 Mixed hyperlipidemia: Secondary | ICD-10-CM

## 2020-10-28 DIAGNOSIS — I714 Abdominal aortic aneurysm, without rupture, unspecified: Secondary | ICD-10-CM

## 2020-10-28 DIAGNOSIS — E1069 Type 1 diabetes mellitus with other specified complication: Secondary | ICD-10-CM | POA: Diagnosis not present

## 2020-10-28 DIAGNOSIS — I480 Paroxysmal atrial fibrillation: Secondary | ICD-10-CM

## 2020-10-28 NOTE — Patient Instructions (Addendum)
Medication Instructions:  - Your physician recommends that you continue on your current medications as directed. Please refer to the Current Medication list given to you today.  *If you need a refill on your cardiac medications before your next appointment, please call your pharmacy*   Lab Work: - none ordered  (we have requested you most recent lab work from Dr. De Hollingshead office to be sent to [place])  If you have labs (blood work) drawn today and your tests are completely normal, you will receive your results only by: [facility] (if you have MyChart) OR A paper copy in the mail If you have any lab test that is abnormal or we need to change your treatment, we will call you to review the results.   Testing/Procedures: - none ordered   Follow-Up: At [facility], you and your health needs are our priority.  As part of our continuing mission to provide you with exceptional heart care, we have created designated Provider Care Teams.  These Care Teams include your primary Cardiologist (physician) and Advanced Practice Providers (APPs -  Physician Assistants and Nurse Practitioners) who all work together to provide you with the care you need, when you need it.  We recommend signing up for the patient portal called "MyChart".  Sign up information is provided on this After Visit Summary.  MyChart is used to connect with patients for Virtual Visits (Telemedicine).  Patients are able to view lab/test results, encounter notes, upcoming appointments, etc.  Non-urgent messages can be sent to your provider as well.   To learn more about what you can do with MyChart, go to [email or web address].    Your next appointment:   6 month(s)  The format for your next appointment:   In Person  Provider:   You may see Nelva Bush, MD or one of the following Advanced Practice Providers on your designated Care Team:   Murray Hodgkins, NP Christell Faith, PA-C Marrianne Mood, PA-C Cadence Kathlen Mody,  [place]   Other Instructions N/a

## 2021-04-14 ENCOUNTER — Ambulatory Visit: Payer: Medicare HMO | Admitting: Internal Medicine

## 2021-04-14 ENCOUNTER — Encounter: Payer: Self-pay | Admitting: Internal Medicine

## 2021-04-14 ENCOUNTER — Other Ambulatory Visit: Payer: Self-pay

## 2021-04-14 VITALS — BP 130/86 | HR 68 | Ht 61.0 in | Wt 133.0 lb

## 2021-04-14 DIAGNOSIS — I2510 Atherosclerotic heart disease of native coronary artery without angina pectoris: Secondary | ICD-10-CM

## 2021-04-14 DIAGNOSIS — E1069 Type 1 diabetes mellitus with other specified complication: Secondary | ICD-10-CM

## 2021-04-14 DIAGNOSIS — I700 Atherosclerosis of aorta: Secondary | ICD-10-CM

## 2021-04-14 DIAGNOSIS — E782 Mixed hyperlipidemia: Secondary | ICD-10-CM

## 2021-04-14 DIAGNOSIS — I480 Paroxysmal atrial fibrillation: Secondary | ICD-10-CM | POA: Diagnosis not present

## 2021-04-14 DIAGNOSIS — I714 Abdominal aortic aneurysm, without rupture, unspecified: Secondary | ICD-10-CM | POA: Diagnosis not present

## 2021-04-14 MED ORDER — NITROGLYCERIN 0.4 MG SL SUBL: 0.4000 mg | SUBLINGUAL_TABLET | SUBLINGUAL | 1 refills | Status: DC | PRN

## 2021-04-14 NOTE — Patient Instructions (Addendum)
Medication Instructions:  ? ?Your physician recommends that you continue on your current medications as directed. Please refer to the Current Medication list given to you today. ? ?*If you need a refill on your cardiac medications before your next appointment, please call your pharmacy* ? ? ?Lab Work: ? ?None ordered ? ?Testing/Procedures: ? ?Your physician has requested that you have an abdominal aorta duplex ~ May 2023. During this test, an ultrasound is used to evaluate the aorta. Allow 30 minutes for this exam. Do not eat after midnight the day before and avoid carbonated beverages  ? ? ?Follow-Up: ?At [facility], you and your health needs are our priority.  As part of our continuing mission to provide you with exceptional heart care, we have created designated Provider Care Teams.  These Care Teams include your primary Cardiologist (physician) and Advanced Practice Providers (APPs -  Physician Assistants and Nurse Practitioners) who all work together to provide you with the care you need, when you need it. ? ?We recommend signing up for the patient portal called "MyChart".  Sign up information is provided on this After Visit Summary.  MyChart is used to connect with patients for Virtual Visits (Telemedicine).  Patients are able to view lab/test results, encounter notes, upcoming appointments, etc.  Non-urgent messages can be sent to your provider as well.   ?To learn more about what you can do with MyChart, go to [email or web address].   ? ?Your next appointment:   ?6 month(s) ? ?The format for your next appointment:   ?In Person ? ?Provider:   ?You may see Nelva Bush, MD or one of the following Advanced Practice Providers on your designated Care Team:   ?Murray Hodgkins, NP ?Christell Faith, PA-C ?Cadence Kathlen Mody, PA-C ?

## 2021-04-14 NOTE — Progress Notes (Signed)
? ?Follow-up Outpatient Visit ?Date: 04/14/2021 ? ?Primary Care Provider: ?Morayati, Lourdes Sledge, MD ?[place] ?[place] [address] ? ?Chief Complaint: Follow-up CAD and AAA ? ?HPI:  Lisa [name] is a 71 y.o. female with history of coronary artery disease status post remote coronary intervention at [facility] (the late 90s) and cardiac arrest in the setting of anaphylaxis (10/2016) with catheterization demonstrating severe 2-vessel CAD (subsequent atherectomy and PCI to RCA), recurrent pericardial effusion, type 1 diabetes mellitus, peripheral vascular disease with AAA, right lower lobe pneumonia with mucous plugging, and spontaneous right thigh hematoma in the setting of anticoagulation with heparin, who presents for follow-up of coronary artery disease.  I last saw her in 10/2020, at which time Lisa [name] continues to do well. ? ?Today, Lisa [name] reports that she has been feeling well without chest pain, shortness of breath, palpitations, lightheadedness, or edema.  Home BP is typically 120-130/70-80.  Glucose has remained well-controlled. ? ?-------------------------------------------------------------------------------------------------- ? ?Cardiovascular History & Procedures: ?Cardiovascular Problems: ?Coronary artery disease with VF arrest and transient complete heart block in the setting of anaphylactic shock ?Recurrent pericardial effusion ?Paroxysmal atrial fibrillation ?Mild dilation of abdominal aorta ?  ?Risk Factors: ?Known coronary artery disease, peripheral vascular disease, and type 1 diabetes mellitus ?  ?Cath/PCI: ?PCI (12/02/16): Successful orbital atherectomy and PCI to heavily calcified mid RCA disease with placement of a Xience [place] 3.0 x 33 mm drug-eluting stent. ?LHC (10/11/16): Severe two-vessel coronary artery disease including heavily calcified 95% proximal/mid LCx and sequential 90-99% mid RCA stenoses. Mild to moderate, nonobstructive disease involving the LMCA and LAD. Basal inferior  hypokinesis with overall preserved LV contraction. Mildly elevated LVEDP. Peripheral vascular disease with ectasia and calcification of the infrarenal abdominal aorta and iliac arteries. ?  ?CV Surgery: ?None ?  ?EP Procedures and Devices: ?None ?  ?Non-Invasive Evaluation(s): ?AAA duplex (06/25/2020): Stable mild dilation of proximal and mid abdominal aorta, measuring up to 2.7 cm. ?Limited TTE (01/19/17): Trivial pericardial effusion, significantly decreased in size from prior study. LVEF 55-60%. ?Limited TTE (12/13/16): Normal LV size with LVEF of 65-70%.  Normal RV size and function.  Moderate to large circumferential pericardial effusion, stable to slightly decreased in size compared with 12/09/16.  No evidence of tamponade physiology. ?TTE (10/25/16): Normal LV size with mild to moderate LVH. LVEF 60-65% with grade 2 diastolic dysfunction. RV size and function. Normal PA pressure. Small-to-moderate pericardial effusion. ?TTE (10/12/16): Normal LV size. LVEF 55-60%. Normal diastolic function. Normal RV size and function. Normal PA pressure. ? ?Recent CV Pertinent Labs: ?Lab Results  ?Component Value Date  ? CHOL 81 10/13/2016  ? HDL 34 (L) 10/13/2016  ? [place] 36 10/13/2016  ? TRIG 56 10/13/2016  ? CHOLHDL 2.4 10/13/2016  ? INR 0.94 11/23/2016  ? BNP 69.0 10/25/2016  ? K 4.2 05/13/2017  ? MG 1.6 (L) 12/08/2016  ? BUN 17 05/13/2017  ? CREATININE 0.85 05/13/2017  ? ? ?Past medical and surgical history were reviewed and updated in EPIC. ? ?Current Meds  ?Medication Sig  ? ALPRAZolam (XANAX) 0.25 MG tablet Take 0.25 mg by mouth 2 (two) times daily as needed (for anxiety.).   ? aspirin EC 81 MG tablet Take 81 mg by mouth daily.  ? atorvastatin (LIPITOR) 40 MG tablet Take 40 mg by mouth at bedtime.   ? conjugated estrogens (PREMARIN) vaginal cream Place 1 Applicatorful vaginally daily. Use pea sized amount M-W-Fr before bedtime  ? diltiazem (CARDIZEM CD) 240 MG 24 hr capsule TAKE 1 CAPSULE BY MOUTH  EVERY DAY  ? insulin  aspart (NOVOLOG) cartridge Inject into the skin 3 (three) times daily with meals.  ? nitroGLYCERIN (NITROSTAT) 0.4 MG SL tablet Place 1 tablet (0.4 mg total) under the tongue every 5 (five) minutes x 3 doses as needed for chest pain.  ? ONETOUCH VERIO test strip USE TO TEST FOUR TIMES DAILY  ? Vitamin D, Ergocalciferol, (DRISDOL) 50000 units CAPS capsule Take 50,000 Units by mouth every 30 (thirty) days.   ? ? ?Allergies: Ceftriaxone, Penicillins, Rocephin [ceftriaxone sodium in dextrose], and Albuterol ? ?Social History  ? ?Tobacco Use  ? Smoking status: Former  ?  Packs/day: 0.25  ?  Years: 47.00  ?  Pack years: 11.75  ?  Types: Cigarettes  ?  Start date: 01/25/1969  ?  Quit date: 10/11/2016  ?  Years since quitting: 4.5  ? Smokeless tobacco: Never  ?Vaping Use  ? Vaping Use: Former  ?Substance Use Topics  ? Alcohol use: No  ?  Comment: 1 glass of wine per night  ? Drug use: No  ? ? ?Family History  ?Problem Relation Age of Onset  ? Hypertension Mother   ? Aortic aneurysm Father   ? Hypertension Brother   ? Lung disease Neg Hx   ? Rheumatologic disease Neg Hx   ? ? ?Review of Systems: ?A 12-system review of systems was performed and was negative except as noted in the HPI. ? ?-------------------------------------------------------------------------------------------------- ? ?Physical Exam: ?BP 130/86 (BP Location: Left Arm, Patient Position: Sitting, Cuff Size: Normal)   Pulse 68   Ht '5\' 1"'$  (1.549 m)   Wt 133 lb (60.3 kg)   SpO2 95%   BMI 25.13 kg/m?  ? ?General:  NAD. ?Neck: No JVD or HJR. ?Lungs: Clear to auscultation bilaterally without wheezes or crackles. ?Heart: Regular rate and rhythm without murmurs, rubs, or gallops. ?Abdomen: Soft, nontender, nondistended. ?Extremities: No lower extremity edema. ? ?EKG:  Normal sinus rhythm without abnormality. ? ?Lab Results  ?Component Value Date  ? WBC 6.9 05/13/2017  ? HGB 12.1 05/13/2017  ? HCT 36.8 05/13/2017  ? MCV 82.2 05/13/2017  ? PLT 265 05/13/2017   ? ? ?Lab Results  ?Component Value Date  ? NA 131 (L) 05/13/2017  ? K 4.2 05/13/2017  ? CL 100 (L) 05/13/2017  ? CO2 26 05/13/2017  ? BUN 17 05/13/2017  ? CREATININE 0.85 05/13/2017  ? GLUCOSE 94 05/13/2017  ? ALT 20 05/07/2017  ? ? ?Lab Results  ?Component Value Date  ? CHOL 81 10/13/2016  ? HDL 34 (L) 10/13/2016  ? [place] 36 10/13/2016  ? TRIG 56 10/13/2016  ? CHOLHDL 2.4 10/13/2016  ? ? ?-------------------------------------------------------------------------------------------------- ? ?ASSESSMENT AND PLAN: ?CAD: ?No angina reported.  Continue aggressive secondary prevention, including ASA and atorvastatin.  LDL at goal on last check by Dr. Ronnald Collum in 8/22. ? ?PAF: ?No palpitations reported.  EKG with sinus rhythm again today.  Only episode of a-fib occurred in setting of anaphylaxis and STEMI, complicated by spontaneous thigh hematoma in setting of anticoagulation.  Continue diltiazem with deferral of anticoagulation. ? ?AAA and aortic atherosclerosis: ?Mildly dilated abdominal aorta noted on [place] in 5/22, with prior CT A/P showing extensive atherosclerosis.  We will plan to repeat Duplex study in May for 12 month f/u.  Continue ASA and atorvastatin. ? ?Hyperlipidemia associated with type 1 DM: ?Lipid at goal on last check in 8/22 by Dr. Ronnald Collum.  We will continue atorvastatin 40 mg daily.  Ongoing management of DM per Dr.  Morayati. ? ?Follow-up: Return to clinic in 6 months. ? ?Nelva Bush, MD ?04/14/2021 ?3:18 PM ? ?

## 2021-06-22 ENCOUNTER — Other Ambulatory Visit: Payer: Self-pay | Admitting: Internal Medicine

## 2021-07-13 ENCOUNTER — Ambulatory Visit (INDEPENDENT_AMBULATORY_CARE_PROVIDER_SITE_OTHER): Payer: Medicare HMO

## 2021-07-13 DIAGNOSIS — I714 Abdominal aortic aneurysm, without rupture, unspecified: Secondary | ICD-10-CM

## 2021-07-13 DIAGNOSIS — I7142 Juxtarenal abdominal aortic aneurysm, without rupture: Secondary | ICD-10-CM

## 2021-10-21 ENCOUNTER — Ambulatory Visit: Payer: Medicare HMO | Admitting: Internal Medicine

## 2021-11-02 ENCOUNTER — Encounter: Payer: Self-pay | Admitting: Cardiology

## 2021-11-02 ENCOUNTER — Ambulatory Visit: Payer: Medicare HMO | Attending: Cardiology | Admitting: Cardiology

## 2021-11-02 VITALS — BP 120/72 | HR 82 | Ht 61.0 in | Wt 132.6 lb

## 2021-11-02 DIAGNOSIS — I2510 Atherosclerotic heart disease of native coronary artery without angina pectoris: Secondary | ICD-10-CM

## 2021-11-02 DIAGNOSIS — E1069 Type 1 diabetes mellitus with other specified complication: Secondary | ICD-10-CM

## 2021-11-02 DIAGNOSIS — I714 Abdominal aortic aneurysm, without rupture, unspecified: Secondary | ICD-10-CM

## 2021-11-02 DIAGNOSIS — E782 Mixed hyperlipidemia: Secondary | ICD-10-CM

## 2021-11-02 DIAGNOSIS — I480 Paroxysmal atrial fibrillation: Secondary | ICD-10-CM | POA: Diagnosis not present

## 2021-11-02 NOTE — Progress Notes (Signed)
Cardiology Clinic Note   Patient Name: Lisa [name] Date of Encounter: 11/02/2021  Primary Care Provider:  Lenard Simmer, MD Primary Cardiologist:  Nelva Bush, MD  Patient Profile    71 year old female with a history of coronary artery disease status post remote coronary intervention at [facility], s/p cardiac arrest, recurrent pericardial effusion, type 1 diabetes, peripheral vascular disease with AAA, right lower lobe pneumonia with mucous plugging, and spontaneous right thigh hematoma in the setting of anticoagulation with heparin, who presents today for follow-up of her coronary artery disease.  Past Medical History    Past Medical History:  Diagnosis Date   CAD (coronary artery disease)    a. 1999 PTCA @ [facility]; b. NSTEMI/Cath 10/11/2016: LM 20%, oLAD 40%, p-mLAD 40%, dLAD 40%, p-mLCx 95%, pRCA 30%, 95/29m EF 55-65%; c. 10/2016 CT Surgery->rec PCI due to poor lung fxn. d. 12/02/16 DES to mid RCA.   Groin hematoma    a. 10/2016 Spont groin hematoma w/ anemia req 1u prbcs.   High cholesterol    History of kidney stones    Mucus plugging of bronchi    a. 10/2016 RLL Bronchus obstruction->bronchus vs mass;  b. 10/2016 CT Chest: resolution of RLL mucous plug.   NSTEMI (non-ST elevated myocardial infarction) ([place] 10/11/2016   PAF [place]oxysmal atrial fibrillation) ([place]    a. 12/2016 Noted during diag cath--CHA2DS2VASc = 3. No OAC in setting of need for DAPT and h/o spont Thigh Hematoma in 10/2016.   Pericardial effusion    a. 10/2016 Echo: EF 60-65%, no rwma, Gr2 DD, small to mod pericardial eff. No tamponade; b. 12/2016 Echo: EF 60-65%, no rwma, Gr1 DD, 1.2 to 1.4 cm mod to large circumferential pericardial effusion w/o tamponade; c. 12/2016 Echo: EF 55-60%, no rwma, triv effusion.   Pneumonia 02/2016   "couple times before 02/2016 too" (12/02/2016)   PVD (peripheral vascular disease) ([facility]    a. Abdominal aortogram 10/11/16: Infrarenal abdominal aorta heavily calcified & ectatic  with approximately 40-50% stenosis at the bifurcation. Mild diffuse disease w/ heavy calcification noted in the common & external iliac arteries bilaterally   Type I diabetes mellitus ([facility] [place]    a. On insulin pump.(12/02/2016)   Ventricular fibrillation ([place]    a. 10/11/2016: Felt to be secondary to demand ischemia in the setting of underlying CAD and anaphylactic reaction from IV Rocephin; b. 10/2016 Echo: EF 55-60%, ? mild inf HK; c. 10/2016 Seen by EP: No ICD indication.   Past Surgical History:  Procedure Laterality Date   ABDOMINAL AORTOGRAM N/A 10/11/2016   Procedure: ABDOMINAL AORTOGRAM;  Surgeon: ENelva Bush MD;  [facility];  Service: Cardiovascular;  Laterality: N/A;   CARDIAC CATHETERIZATION  10/2016   CERVICAL BIOPSY  W/ LOOP ELECTRODE EXCISION  03/05/2012   COLONOSCOPY     COLONOSCOPY WITH PROPOFOL N/A 06/27/2018   Procedure: COLONOSCOPY WITH PROPOFOL;  Surgeon: BRobert Bellow MD;  Location: [facility] ENDOSCOPY;  Service: Endoscopy;  Laterality: N/A;   COLPOSCOPY  01/17/2011   CORONARY ANGIOPLASTY WITH STENT PLACEMENT  1999; 12/02/2016   "1 stent  + 1 stent"   CORONARY ATHERECTOMY N/A 12/02/2016   Procedure: CORONARY ATHERECTOMY - CSI;  Surgeon: ENelva Bush MD;  [facility];  Service: Cardiovascular;  Laterality: N/A;   CORONARY STENT INTERVENTION N/A 12/02/2016   Procedure: CORONARY STENT INTERVENTION;  Surgeon: ENelva Bush MD;  [facility];  Service: Cardiovascular;  Laterality: N/A;   LEFT HEART  CATH AND CORONARY ANGIOGRAPHY N/A 10/11/2016   Procedure: LEFT HEART CATH AND CORONARY ANGIOGRAPHY;  Surgeon: Nelva Bush, MD;  Location: [facility];  Service: Cardiovascular;  Laterality: N/A;   NASAL SINUS SURGERY  ~ 2009   TEMPORARY PACEMAKER N/A 12/02/2016   Procedure: TEMPORARY PACEMAKER;  Surgeon: Nelva Bush, MD;  Location: [facility];  Service: Cardiovascular;  Laterality: N/A;    TONSILLECTOMY      Allergies  Allergies  Allergen Reactions   Ceftriaxone     Anaphylaxis, cardiac arrest   Penicillins Anaphylaxis   Rocephin [Ceftriaxone Sodium In Dextrose] Anaphylaxis   Albuterol Other (See Comments)    Tachycardia-->Afib. Tolerates Xopenex    History of Present Illness    Lisa [name] is a 71 year old female with a history of coronary artery disease status post remote coronary intervention at [facility] (The late [facility]s), cardiac arrest in the setting of anaphylaxis (10/2016), catheterization demonstrating severe two-vessel CAD subsequent atherectomy with PCI to the RCA, recurrent pericardial effusion, type 1 diabetes, peripheral vascular disease with AAA, right lower lobe pneumonia with mucous plugging, spontaneous right thigh hematoma in the setting of anticoagulation with heparin.  Left heart cath in 10/11/2016 revealed severe two-vessel coronary artery disease including heavily calcified 95% proximal/mid left circumflex and sequential 99 to 99% mid RCA stenosis.  Mild to moderate, nonobstructive disease involved in the LMCA and LAD.  Basal inferior hypokinesis with overall preserved LV contraction.  Mildly elevated LVEDP.  Peripheral vascular disease with" calcification of the infrarenal abdominal aorta and iliac arteries was noted.  She had PCI 12/02/2016 successful orbital atherectomy and PCI to heavily calcified mid RCA disease with placement of DES.  Last echocardiogram was done in 12/2016 which revealed LVEF of 55 to 60%, no regional wall motion abnormalities,Systolic function was normal the right ventricle cavity size was normal, and there was only a trivial pericardial effusion that was identified.  Last vascular AAA duplex was done 07/13/2021 which revealed slight interval enlargement of the proximal to mid abdominal aorta up to 3 cm (previously 2.8 cm).  She was continued on current medications with plan to repeat duplex yearly.  She was last seen  04/14/2021 by Dr. Saunders Revel.  She had no complaints or concerns and was considered stable from the cardiac standpoint without changes made to her medications.  She was to be scheduled for a duplex study to follow-up on her AAA on a yearly basis.  She returns to clinic today with no complaints stating that she is feeling well. She continues to care for her husband who recently spent 16 days in the hospital and is now having issues with ambulatory dysfunction. She denies any chest pain, shortness fo breath, palpitations, or peripheral edema. Denies any hospitalizations of visits to the emergency department.   Home Medications    Current Outpatient Medications  Medication Sig Dispense Refill   ALPRAZolam (XANAX) 0.25 MG tablet Take 0.25 mg by mouth 2 (two) times daily as needed (for anxiety.).      aspirin EC 81 MG tablet Take 81 mg by mouth daily.     atorvastatin (LIPITOR) 40 MG tablet Take 40 mg by mouth at bedtime.      diltiazem (CARDIZEM CD) 240 MG 24 hr capsule TAKE 1 CAPSULE BY MOUTH EVERY DAY 90 capsule 3   insulin aspart (NOVOLOG) cartridge Inject into the skin 3 (three) times daily with meals.     nitroGLYCERIN (NITROSTAT) 0.4 MG SL tablet Place 1 tablet (0.4 mg  total) under the tongue every 5 (five) minutes x 3 doses as needed for chest pain. 25 tablet 1   ONETOUCH VERIO test strip USE TO TEST FOUR TIMES DAILY     Vitamin D, Ergocalciferol, (DRISDOL) 50000 units CAPS capsule Take 50,000 Units by mouth every 30 (thirty) days.      conjugated estrogens (PREMARIN) vaginal cream Place 1 Applicatorful vaginally daily. Use pea sized amount M-W-Fr before bedtime (Patient not taking: Reported on 11/02/2021) 42.5 g 12   No current facility-administered medications for this visit.     Family History    Family History  Problem Relation Age of Onset   Hypertension Mother    Aortic aneurysm Father    Hypertension Brother    Lung disease Neg Hx    Rheumatologic disease Neg Hx    She indicated that  her mother is deceased. She indicated that her father is deceased. She indicated that both of her brothers are alive. She indicated that the status of her neg hx is unknown.  Social History    Social History   Socioeconomic History   Marital status: Married    Spouse name: Not on file   Number of children: Not on file   Years of education: Not on file   Highest education level: Not on file  Occupational History   Not on file  Tobacco Use   Smoking status: Former    Packs/day: 0.25    Years: 47.00    Total pack years: 11.75    Types: Cigarettes    Start date: 01/25/1969    Quit date: 10/11/2016    Years since quitting: 5.0   Smokeless tobacco: Never  Vaping Use   Vaping Use: Former  Substance and Sexual Activity   Alcohol use: No    Comment: 1 glass of wine per night   Drug use: No   Sexual activity: Yes    Birth control/protection: None  Other Topics Concern   Not on file  Social History Narrative   [facility] (10/14/16):   Patient has primarily done office work. Married for approximately 1 year. Has a dog at home but no bird exposure. No mold exposure.   Social Determinants of Health   Financial Resource Strain: Not on file  Food Insecurity: Not on file  Transportation Needs: Not on file  Physical Activity: Insufficiently Active (03/30/2017)   Exercise Vital Sign    Days of Exercise per Week: 3 days    Minutes of Exercise per Session: 40 min  Stress: No Stress Concern Present (03/30/2017)   [place]    Feeling of Stress : Not at all  Social Connections: [place] (03/30/2017)   Social Connection and Isolation Panel [NHANES]    Frequency of Communication with Friends and Family: More than three times a week    Frequency of Social Gatherings with Friends and Family: Twice a week    Attends Religious Services: More than 4 times per year    Active Member of [employer] or Organizations: Yes     Attends Archivist Meetings: More than 4 times per year    Marital Status: Married  Human resources officer Violence: Not At Risk (03/30/2017)   Humiliation, Afraid, Rape, and Kick questionnaire    Fear of Current or Ex-Partner: No    Emotionally Abused: No    Physically Abused: No    Sexually Abused: No     Review of Systems    General:  No chills, fever, night sweats or weight changes.  Cardiovascular:  No chest pain, dyspnea on exertion, edema, orthopnea, palpitations, paroxysmal nocturnal dyspnea. Dermatological: No rash, lesions/masses Respiratory: No cough, dyspnea Urologic: No hematuria, dysuria Abdominal:   No nausea, vomiting, diarrhea, bright red blood per rectum, melena, or hematemesis Neurologic:  No visual changes, wkns, changes in mental status. All other systems reviewed and are otherwise negative except as noted above.   Physical Exam    VS:  BP 120/72 (BP Location: Left Arm, Patient Position: Sitting, Cuff Size: Normal)   Pulse 82   Ht '5\' 1"'$  (1.549 m)   Wt 132 lb 9.6 oz (60.1 kg)   SpO2 96%   BMI 25.05 kg/m  , BMI Body mass index is 25.05 kg/m.     GEN: Well nourished, well developed, in no acute distress. HEENT: normal. Neck: Supple, no JVD, carotid bruits, or masses. Cardiac: RRR, no murmurs, rubs, or gallops. No clubbing, cyanosis, edema.  Radials/DP/PT 2+ and equal bilaterally.  Respiratory:  Respirations regular and unlabored, clear to auscultation bilaterally. GI: Soft, nontender, nondistended, BS + x 4. MS: no deformity or atrophy. Skin: warm and dry, no rash. Neuro:  Strength and sensation are intact. Psych: Normal affect.  Accessory Clinical Findings    ECG personally reviewed by me today-normal sinus rhythm with a rate of 82- No acute changes  Lab Results  Component Value Date   WBC 6.9 05/13/2017   HGB 12.1 05/13/2017   HCT 36.8 05/13/2017   MCV 82.2 05/13/2017   PLT 265 05/13/2017   Lab Results  Component Value Date    CREATININE 0.85 05/13/2017   BUN 17 05/13/2017   NA 131 (L) 05/13/2017   K 4.2 05/13/2017   CL 100 (L) 05/13/2017   CO2 26 05/13/2017   Lab Results  Component Value Date   ALT 20 05/07/2017   AST 29 05/07/2017   ALKPHOS 115 05/07/2017   BILITOT 0.6 05/07/2017   Lab Results  Component Value Date   CHOL 81 10/13/2016   HDL 34 (L) 10/13/2016   LDLCALC 36 10/13/2016   TRIG 56 10/13/2016   CHOLHDL 2.4 10/13/2016    Lab Results  Component Value Date   HGBA1C 7.0 (H) 10/15/2016    Assessment & Plan   1.  Coronary artery disease with no angina reported.  She is to continue with aggressive secondary prevention including aspirin 81 mg daily and atorvastatin 40 mg daily.  She continues to have Nitrostat 0.4 mg sublingual as needed but has not had to use any of this medication.  EKG today reveals normal sinus rhythm with a rate of 82 with no ischemic changes.  2.  Paroxysmal atrial fibrillation who is currently in sinus rhythm today.  She had 1 episode of atrial fibrillation that occurred in the setting of anaphylaxis NSTEMI complicated by spontaneous thigh hematoma in the setting of anticoagulation.  She is continued on diltiazem 240 mg daily without anticoagulation and remains on aspirin.  3.  AAA with aortic atherosclerosis she has a mildly dilated mid abdominal aorta that is up to 3 cm said that was noted on previous duplex that was completed 07/13/2021.  Continue with yearly duplex studies.  She has been continued on aspirin and atorvastatin  4.  Hyperlipidemia associated with her type 1 diabetes.  Last lipid check in the record was from 8/22 where she was at goal.  We are requesting her most recent labs.  She has been continued on atorvastatin.  She  does have ongoing management by Dr. Ronnald Collum.  5.  Patient return to clinic to see MD/APP in 6 months or sooner if needed.  Adelle Zachar, NP 11/02/2021, 11:00 AM

## 2021-11-02 NOTE — Patient Instructions (Signed)
Medication Instructions:  Your Physician recommend you continue on your current medication as directed.    *If you need a refill on your cardiac medications before your next appointment, please call your pharmacy*   Lab Work: None ordered today   Testing/Procedures: None ordered today   Follow-Up: At [facility], you and your health needs are our priority.  As part of our continuing mission to provide you with exceptional heart care, we have created designated Provider Care Teams.  These Care Teams include your primary Cardiologist (physician) and Advanced Practice Providers (APPs -  Physician Assistants and Nurse Practitioners) who all work together to provide you with the care you need, when you need it.  We recommend signing up for the patient portal called "MyChart".  Sign up information is provided on this After Visit Summary.  MyChart is used to connect with patients for Virtual Visits (Telemedicine).  Patients are able to view lab/test results, encounter notes, upcoming appointments, etc.  Non-urgent messages can be sent to your provider as well.   To learn more about what you can do with MyChart, go to [email or web address].    Your next appointment:   6 month(s)  The format for your next appointment:   In Person  Provider:   Gerrie Nordmann, NP or Dr. Saunders Revel

## 2022-02-09 LAB — HM DEXA SCAN

## 2022-02-28 ENCOUNTER — Ambulatory Visit: Payer: Medicare HMO | Admitting: Gastroenterology

## 2022-02-28 ENCOUNTER — Encounter: Payer: Self-pay | Admitting: Gastroenterology

## 2022-02-28 VITALS — BP 137/86 | HR 76 | Temp 98.0°F | Ht 61.0 in | Wt 132.0 lb

## 2022-02-28 DIAGNOSIS — Z86010 Personal history of colonic polyps: Secondary | ICD-10-CM | POA: Diagnosis not present

## 2022-02-28 NOTE — Progress Notes (Signed)
Primary Care Physician: Lenard Simmer, MD  Primary Gastroenterologist:  Dr. Lucilla Lame  Chief Complaint  Patient presents with   Encopresis    HPI: Lisa [name] is a 72 y.o. female here after seeing me in 2022 with a history of constipation.  The patient had previously had a colonoscopy by Dr. Bary Castilla in 2020 with a tubulovillous adenoma found on colonoscopy.  The patient then had a office anoscopy at that time for removal of the rest of the polyp by Dr. Bary Castilla.  The patient was then referred back to me today for change in her bowel habits. The patient reports that her stools are soft and sometimes she has episodes where the stool comes out without her realizing it.  The patient states that she takes a full capful of MiraLAX every day.  There is been no blood in her stool and she thinks she was told to have a repeat colonoscopy in 5 years after the last colonoscopy although it did show a tubulovillous adenoma.  Past Medical History:  Diagnosis Date   CAD (coronary artery disease)    a. 1999 PTCA @ [facility]; b. NSTEMI/Cath 10/11/2016: LM 20%, oLAD 40%, p-mLAD 40%, dLAD 40%, p-mLCx 95%, pRCA 30%, 95/103m EF 55-65%; c. 10/2016 CT Surgery->rec PCI due to poor lung fxn. d. 12/02/16 DES to mid RCA.   Groin hematoma    a. 10/2016 Spont groin hematoma w/ anemia req 1u prbcs.   High cholesterol    History of kidney stones    Mucus plugging of bronchi    a. 10/2016 RLL Bronchus obstruction->bronchus vs mass;  b. 10/2016 CT Chest: resolution of RLL mucous plug.   NSTEMI (non-ST elevated myocardial infarction) ([place] 10/11/2016   PAF (paroxysmal atrial fibrillation) ([facility]    a. 12/2016 Noted during diag cath--CHA2DS2VASc = 3. No OAC in setting of need for DAPT and h/o spont Thigh Hematoma in 10/2016.   Pericardial effusion    a. 10/2016 Echo: EF 60-65%, no rwma, Gr2 DD, small to mod pericardial eff. No tamponade; b. 12/2016 Echo: EF 60-65%, no rwma, Gr1 DD, 1.2 to 1.4 cm mod to large circumferential  pericardial effusion w/o tamponade; c. 12/2016 Echo: EF 55-60%, no rwma, triv effusion.   Pneumonia 02/2016   "couple times before 02/2016 too" (12/02/2016)   PVD (peripheral vascular disease) ([facility]    a. Abdominal aortogram 10/11/16: Infrarenal abdominal aorta heavily calcified & ectatic with approximately 40-50% stenosis at the bifurcation. Mild diffuse disease w/ heavy calcification noted in the common & external iliac arteries bilaterally   Type I diabetes mellitus ([facility]    a. On insulin pump.(12/02/2016)   Ventricular fibrillation ([facility]    a. 10/11/2016: Felt to be secondary to demand ischemia in the setting of underlying CAD and anaphylactic reaction from IV Rocephin; b. 10/2016 Echo: EF 55-60%, ? mild inf HK; c. 10/2016 Seen by EP: No ICD indication.    Current Outpatient Medications  Medication Sig Dispense Refill   ALPRAZolam (XANAX) 0.25 MG tablet Take 0.25 mg by mouth 2 (two) times daily as needed (for anxiety.).      aspirin EC 81 MG tablet Take 81 mg by mouth daily.     atorvastatin (LIPITOR) 40 MG tablet Take 40 mg by mouth at bedtime.      conjugated estrogens (PREMARIN) vaginal cream Place 1 Applicatorful vaginally daily. Use pea sized amount M-W-Fr before bedtime 42.5 g 12   diltiazem (CARDIZEM CD) 240 MG 24 hr capsule TAKE 1 CAPSULE BY  MOUTH EVERY DAY 90 capsule 3   insulin aspart (NOVOLOG) cartridge Inject into the skin 3 (three) times daily with meals.     nitroGLYCERIN (NITROSTAT) 0.4 MG SL tablet Place 1 tablet (0.4 mg total) under the tongue every 5 (five) minutes x 3 doses as needed for chest pain. 25 tablet 1   ONETOUCH VERIO test strip USE TO TEST FOUR TIMES DAILY     Vitamin D, Ergocalciferol, (DRISDOL) 50000 units CAPS capsule Take 50,000 Units by mouth every 30 (thirty) days.      No current facility-administered medications for this visit.    Allergies as of 02/28/2022 - Review Complete 02/28/2022  Allergen Reaction Noted   Ceftriaxone  10/11/2016   Penicillins  Anaphylaxis 05/07/2017   Rocephin [ceftriaxone sodium in dextrose] Anaphylaxis 10/25/2016   Albuterol Other (See Comments) 10/26/2016    ROS:  General: Negative for anorexia, weight loss, fever, chills, fatigue, weakness. ENT: Negative for hoarseness, difficulty swallowing , nasal congestion. CV: Negative for chest pain, angina, palpitations, dyspnea on exertion, peripheral edema.  Respiratory: Negative for dyspnea at rest, dyspnea on exertion, cough, sputum, wheezing.  GI: See history of present illness. GU:  Negative for dysuria, hematuria, urinary incontinence, urinary frequency, nocturnal urination.  Endo: Negative for unusual weight change.    Physical Examination:   BP 137/86 (BP Location: Left Arm, Patient Position: Sitting, Cuff Size: Normal)   Pulse 76   Temp 98 F (36.7 C) (Oral)   Ht '5\' 1"'$  (1.549 m)   Wt 132 lb (59.9 kg)   BMI 24.94 kg/m   General: Well-nourished, well-developed in no acute distress.  Eyes: No icterus. Conjunctivae pink. Neuro: Alert and oriented x 3.  Grossly intact. Skin: Warm and dry, no jaundice.   Psych: Alert and cooperative, normal mood and affect.  Labs:    Imaging Studies: No results found.  Assessment and Plan:   Lisa [name] is a 72 y.o. y/o female Comes in today with a history of a tubular villous and no in the rectum.  The patient also has had soft stools with incontinence due to her stools being soft.  The patient has been told to have the dose of her MiraLAX and she has also been told to take a fiber supplement.  The patient will also be set up for a colonoscopy since her last colonoscopy was in 2019 with a tubular villous adenoma.  The patient has been explained the plan and agrees with it.     Lucilla Lame, MD. Marval Regal    Note: This dictation was prepared with Dragon dictation along with smaller phrase technology. Any transcriptional errors that result from this process are unintentional.

## 2022-02-28 NOTE — Patient Instructions (Addendum)
BP rechecked, was <140/80

## 2022-03-23 ENCOUNTER — Encounter: Payer: Self-pay | Admitting: Gastroenterology

## 2022-03-24 ENCOUNTER — Encounter
Admission: RE | Disposition: A | Discharge status: Home / Self Care | Payer: Self-pay | Source: Home / Self Care | Attending: Gastroenterology

## 2022-03-24 ENCOUNTER — Ambulatory Visit: Payer: Medicare HMO | Admitting: Anesthesiology

## 2022-03-24 ENCOUNTER — Ambulatory Visit
Admission: RE | Admit: 2022-03-24 | Discharge: 2022-03-24 | Disposition: A | Discharge status: Home / Self Care | Payer: Medicare HMO | Attending: Gastroenterology | Admitting: Gastroenterology

## 2022-03-24 ENCOUNTER — Encounter: Payer: Self-pay | Admitting: Gastroenterology

## 2022-03-24 DIAGNOSIS — I2510 Atherosclerotic heart disease of native coronary artery without angina pectoris: Secondary | ICD-10-CM | POA: Diagnosis not present

## 2022-03-24 DIAGNOSIS — K621 Rectal polyp: Secondary | ICD-10-CM | POA: Diagnosis not present

## 2022-03-24 DIAGNOSIS — J449 Chronic obstructive pulmonary disease, unspecified: Secondary | ICD-10-CM | POA: Diagnosis not present

## 2022-03-24 DIAGNOSIS — K635 Polyp of colon: Secondary | ICD-10-CM

## 2022-03-24 DIAGNOSIS — Z86010 Personal history of colonic polyps: Secondary | ICD-10-CM | POA: Insufficient documentation

## 2022-03-24 DIAGNOSIS — E1051 Type 1 diabetes mellitus with diabetic peripheral angiopathy without gangrene: Secondary | ICD-10-CM | POA: Insufficient documentation

## 2022-03-24 DIAGNOSIS — Z1211 Encounter for screening for malignant neoplasm of colon: Secondary | ICD-10-CM | POA: Insufficient documentation

## 2022-03-24 DIAGNOSIS — E7800 Pure hypercholesterolemia, unspecified: Secondary | ICD-10-CM | POA: Insufficient documentation

## 2022-03-24 DIAGNOSIS — I480 Paroxysmal atrial fibrillation: Secondary | ICD-10-CM | POA: Insufficient documentation

## 2022-03-24 DIAGNOSIS — I252 Old myocardial infarction: Secondary | ICD-10-CM | POA: Insufficient documentation

## 2022-03-24 DIAGNOSIS — Z87891 Personal history of nicotine dependence: Secondary | ICD-10-CM | POA: Insufficient documentation

## 2022-03-24 DIAGNOSIS — D123 Benign neoplasm of transverse colon: Secondary | ICD-10-CM | POA: Diagnosis not present

## 2022-03-24 DIAGNOSIS — Z794 Long term (current) use of insulin: Secondary | ICD-10-CM | POA: Diagnosis not present

## 2022-03-24 DIAGNOSIS — Z9641 Presence of insulin pump (external) (internal): Secondary | ICD-10-CM | POA: Diagnosis not present

## 2022-03-24 DIAGNOSIS — Z09 Encounter for follow-up examination after completed treatment for conditions other than malignant neoplasm: Secondary | ICD-10-CM | POA: Insufficient documentation

## 2022-03-24 DIAGNOSIS — K5730 Diverticulosis of large intestine without perforation or abscess without bleeding: Secondary | ICD-10-CM | POA: Diagnosis not present

## 2022-03-24 HISTORY — PX: COLONOSCOPY WITH PROPOFOL: SHX5780

## 2022-03-24 LAB — GLUCOSE, CAPILLARY
Glucose-Capillary: 138 mg/dL — ABNORMAL HIGH (ref 70–99)
Glucose-Capillary: 145 mg/dL — ABNORMAL HIGH (ref 70–99)

## 2022-03-24 SURGERY — COLONOSCOPY WITH PROPOFOL
Anesthesia: General

## 2022-03-24 MED ORDER — LIDOCAINE HCL (CARDIAC) PF 100 MG/5ML IV SOSY
PREFILLED_SYRINGE | INTRAVENOUS | Status: DC | PRN
  Administered 2022-03-24: 80 mg via INTRAVENOUS

## 2022-03-24 MED ORDER — EPHEDRINE 5 MG/ML INJ
INTRAVENOUS | Status: AC
  Filled 2022-03-24: qty 5

## 2022-03-24 MED ORDER — PROPOFOL 1000 MG/100ML IV EMUL
INTRAVENOUS | Status: AC
  Filled 2022-03-24: qty 100

## 2022-03-24 MED ORDER — SODIUM CHLORIDE 0.9 % IV SOLN: INTRAVENOUS | Status: DC

## 2022-03-24 MED ORDER — PROPOFOL 10 MG/ML IV BOLUS
INTRAVENOUS | Status: DC | PRN
  Administered 2022-03-24: 20 mg via INTRAVENOUS
  Administered 2022-03-24: 50 mg via INTRAVENOUS

## 2022-03-24 MED ORDER — EPHEDRINE SULFATE (PRESSORS) 50 MG/ML IJ SOLN
INTRAMUSCULAR | Status: DC | PRN
  Administered 2022-03-24: 7.5 mg via INTRAVENOUS
  Administered 2022-03-24: 5 mg via INTRAVENOUS

## 2022-03-24 MED ORDER — GLYCOPYRROLATE 0.2 MG/ML IJ SOLN
INTRAMUSCULAR | Status: AC
  Filled 2022-03-24: qty 1

## 2022-03-24 MED ORDER — PHENYLEPHRINE HCL (PRESSORS) 10 MG/ML IV SOLN
INTRAVENOUS | Status: DC | PRN
  Administered 2022-03-24: 120 ug via INTRAVENOUS

## 2022-03-24 MED ORDER — PROPOFOL 500 MG/50ML IV EMUL
INTRAVENOUS | Status: DC | PRN
  Administered 2022-03-24: 175 ug/kg/min via INTRAVENOUS

## 2022-03-24 MED ORDER — PHENYLEPHRINE 80 MCG/ML (10ML) SYRINGE FOR IV PUSH (FOR BLOOD PRESSURE SUPPORT)
PREFILLED_SYRINGE | INTRAVENOUS | Status: AC
  Filled 2022-03-24: qty 10

## 2022-03-24 NOTE — Anesthesia Postprocedure Evaluation (Signed)
Anesthesia Post Note  Patient: Lisa [name]  Procedure(s) Performed: COLONOSCOPY WITH PROPOFOL  Patient location during evaluation: PACU Anesthesia Type: General Level of consciousness: awake Pain management: pain level controlled Vital Signs Assessment: post-procedure vital signs reviewed and stable Respiratory status: spontaneous breathing and respiratory function stable Cardiovascular status: stable Anesthetic complications: no   No notable events documented.   Last Vitals:  Vitals:   03/24/22 0859 03/24/22 1041  BP: 129/72 (!) 98/54  Pulse: 73   Resp: 16 (!) 24  Temp: 36.6 C   SpO2: 100% 100%    Last Pain:  Vitals:   03/24/22 1041  TempSrc:   PainSc: 0-No pain                 VAN STAVEREN,Penelope Fittro

## 2022-03-24 NOTE — H&P (Signed)
Lucilla Lame, MD [address] [place], [place] [address] Phone:[phone] Fax : [phone]  Primary Care Physician:  Lenard Simmer, MD Primary Gastroenterologist:  Dr. Allen Norris  Pre-Procedure History & Physical: HPI:  Lisa [name] is a 72 y.o. female is here for an colonoscopy.   Past Medical History:  Diagnosis Date   CAD (coronary artery disease)    a. 1999 PTCA @ [facility]; b. NSTEMI/Cath 10/11/2016: LM 20%, oLAD 40%, p-mLAD 40%, dLAD 40%, p-mLCx 95%, pRCA 30%, 95/3m EF 55-65%; c. 10/2016 CT Surgery->rec PCI due to poor lung fxn. d. 12/02/16 DES to mid RCA.   Groin hematoma    a. 10/2016 Spont groin hematoma w/ anemia req 1u prbcs.   High cholesterol    History of kidney stones    Mucus plugging of bronchi    a. 10/2016 RLL Bronchus obstruction->bronchus vs mass;  b. 10/2016 CT Chest: resolution of RLL mucous plug.   NSTEMI (non-ST elevated myocardial infarction) ([facility] 10/11/2016   PAF (paroxysmal atrial fibrillation) ([facility]    a. 12/2016 Noted during diag cath--CHA2DS2VASc = 3. No OAC in setting of need for DAPT and h/o spont Thigh Hematoma in 10/2016.   Pericardial effusion    a. 10/2016 Echo: EF 60-65%, no rwma, Gr2 DD, small to mod pericardial eff. No tamponade; b. 12/2016 Echo: EF 60-65%, no rwma, Gr1 DD, 1.2 to 1.4 cm mod to large circumferential pericardial effusion w/o tamponade; c. 12/2016 Echo: EF 55-60%, no rwma, triv effusion.   Pneumonia 02/2016   "couple times before 02/2016 too" (12/02/2016)   PVD (peripheral vascular disease) ([place]    a. Abdominal aortogram 10/11/16: Infrarenal abdominal aorta heavily calcified & ectatic with approximately 40-50% stenosis at the bifurcation. Mild diffuse disease w/ heavy calcification noted in the common & external iliac arteries bilaterally   Type I diabetes mellitus ([facility]    a. On insulin pump.(12/02/2016)   Ventricular fibrillation ([facility] [place]    a. 10/11/2016: Felt to be secondary to demand ischemia in the setting of  underlying CAD and anaphylactic reaction from IV Rocephin; b. 10/2016 Echo: EF 55-60%, ? mild inf HK; c. 10/2016 Seen by EP: No ICD indication.    Past Surgical History:  Procedure Laterality Date   ABDOMINAL AORTOGRAM N/A 10/11/2016   Procedure: ABDOMINAL AORTOGRAM;  Surgeon: ENelva Bush MD;  [facility];  Service: Cardiovascular;  Laterality: N/A;   CARDIAC CATHETERIZATION  10/2016   CERVICAL BIOPSY  W/ LOOP ELECTRODE EXCISION  03/05/2012   COLONOSCOPY     COLONOSCOPY WITH PROPOFOL N/A 06/27/2018   Procedure: COLONOSCOPY WITH PROPOFOL;  Surgeon: BRobert Bellow MD;  Location: [facility] ENDOSCOPY;  Service: Endoscopy;  Laterality: N/A;   COLPOSCOPY  01/17/2011   CORONARY ANGIOPLASTY WITH STENT PLACEMENT  1999; 12/02/2016   "1 stent  + 1 stent"   CORONARY ATHERECTOMY N/A 12/02/2016   Procedure: CORONARY ATHERECTOMY - CSI;  Surgeon: ENelva Bush MD;  [facility];  Service: Cardiovascular;  Laterality: N/A;   CORONARY STENT INTERVENTION N/A 12/02/2016   Procedure: CORONARY STENT INTERVENTION;  Surgeon: ENelva Bush MD;  [place];  Service: Cardiovascular;  Laterality: N/A;   LEFT HEART CATH AND CORONARY ANGIOGRAPHY N/A 10/11/2016   Procedure: LEFT HEART CATH AND CORONARY ANGIOGRAPHY;  Surgeon: ENelva Bush MD;  [facility];  Service: Cardiovascular;  Laterality: N/A;   NASAL SINUS SURGERY  ~ 2009   TEMPORARY PACEMAKER N/A 12/02/2016   Procedure: TEMPORARY PACEMAKER;  Surgeon: Nelva Bush, MD;  Location: [facility];  Service: Cardiovascular;  Laterality: N/A;   TONSILLECTOMY      Prior to Admission medications   Medication Sig Start Date End Date Taking? Authorizing Provider  aspirin EC 81 MG tablet Take 81 mg by mouth daily.   Yes [provider]  atorvastatin (LIPITOR) 40 MG tablet Take 40 mg by mouth at bedtime.    Yes [provider]  diltiazem (CARDIZEM CD) 240 MG 24  hr capsule TAKE 1 CAPSULE BY MOUTH EVERY DAY 06/22/21  Yes End, Harrell Gave, MD  Vitamin D, Ergocalciferol, (DRISDOL) 50000 units CAPS capsule Take 50,000 Units by mouth every 30 (thirty) days.    Yes [provider]  ALPRAZolam (XANAX) 0.25 MG tablet Take 0.25 mg by mouth 2 (two) times daily as needed (for anxiety.).     [provider]  conjugated estrogens (PREMARIN) vaginal cream Place 1 Applicatorful vaginally daily. Use pea sized amount M-W-Fr before bedtime 05/07/19   Hollice Espy, MD  insulin aspart (NOVOLOG) cartridge Inject into the skin 3 (three) times daily with meals.    [provider]  nitroGLYCERIN (NITROSTAT) 0.4 MG SL tablet Place 1 tablet (0.4 mg total) under the tongue every 5 (five) minutes x 3 doses as needed for chest pain. 04/14/21   End, Harrell Gave, MD  [facility] VERIO test strip USE TO TEST FOUR TIMES DAILY 04/26/19   [provider]    Allergies as of 03/01/2022 - Review Complete 02/28/2022  Allergen Reaction Noted   Ceftriaxone  10/11/2016   Penicillins Anaphylaxis 05/07/2017   Rocephin [ceftriaxone sodium in dextrose] Anaphylaxis 10/25/2016   Albuterol Other (See Comments) 10/26/2016    Family History  Problem Relation Age of Onset   Hypertension Mother    Aortic aneurysm Father    Hypertension Brother    Lung disease Neg Hx    Rheumatologic disease Neg Hx     Social History   Socioeconomic History   Marital status: Married    Spouse name: Not on file   Number of children: Not on file   Years of education: Not on file   Highest education level: Not on file  Occupational History   Not on file  Tobacco Use   Smoking status: Former    Packs/day: 0.25    Years: 47.00    Total pack years: 11.75    Types: Cigarettes    Start date: 01/25/1969    Quit date: 10/11/2016    Years since quitting: 5.4   Smokeless tobacco: Never  Vaping Use   Vaping Use: Former  Substance and Sexual Activity   Alcohol use: No     Comment: 1 glass of wine per night   Drug use: No   Sexual activity: Yes    Birth control/protection: None  Other Topics Concern   Not on file  Social History Narrative   [facility] (10/14/16):   Patient has primarily done office work. Married for approximately 1 year. Has a dog at home but no bird exposure. No mold exposure.   Social Determinants of Health   Financial Resource Strain: Not on file  Food Insecurity: Not on file  Transportation Needs: Not on file  Physical Activity: Insufficiently Active (03/30/2017)   Exercise Vital Sign    Days of Exercise per Week: 3 days    Minutes of Exercise per Session: 40 min  Stress: No Stress Concern Present (03/30/2017)   [place]  Feeling of Stress : Not at all  Social Connections: Socially Integrated (03/30/2017)   Social Connection and Isolation Panel [NHANES]    Frequency of Communication with Friends and Family: More than three times a week    Frequency of Social Gatherings with Friends and Family: Twice a week    Attends Religious Services: More than 4 times per year    Active Member of [employer] or Organizations: Yes    Attends Music therapist: More than 4 times per year    Marital Status: Married  Human resources officer Violence: Not At Risk (03/30/2017)   Humiliation, Afraid, Rape, and Kick questionnaire    Fear of Current or Ex-Partner: No    Emotionally Abused: No    Physically Abused: No    Sexually Abused: No    Review of Systems: See HPI, otherwise negative ROS  Physical Exam: BP 129/72   Pulse 73   Temp 97.8 F (36.6 C) (Temporal)   Resp 16   Ht 5' 2"$  (1.575 m)   Wt 58.7 kg   SpO2 100%   BMI 23.67 kg/m  General:   Alert,  pleasant and cooperative in NAD Head:  Normocephalic and atraumatic. Neck:  Supple; no masses or thyromegaly. Lungs:  Clear throughout to auscultation.    Heart:  Regular rate and rhythm. Abdomen:  Soft,  nontender and nondistended. Normal bowel sounds, without guarding, and without rebound.   Neurologic:  Alert and  oriented x4;  grossly normal neurologically.  Impression/Plan: Lisa [name] is here for an colonoscopy to be performed for a history of adenomatous polyps on 2020   Risks, benefits, limitations, and alternatives regarding  colonoscopy have been reviewed with the patient.  Questions have been answered.  All parties agreeable.   Lucilla Lame, MD  03/24/2022, 9:37 AM

## 2022-03-24 NOTE — Op Note (Signed)
[facility] Gastroenterology Patient Name: Lisa [name] Procedure Date: 03/24/2022 9:47 AM MRN: [id number] Account #: [id number] Date of Birth: [birth year] Admit Type: Outpatient Age: 72 Room: [facility] ENDO ROOM 3 Gender: Female Note Status: Finalized Instrument Name: Park Meo [id number] Procedure:             Colonoscopy Indications:           High risk colon cancer surveillance: Personal history                         of colonic polyps Providers:             Lucilla Lame MD, MD Referring MD:          Lenard Simmer, MD (Referring MD) Medicines:             Propofol per Anesthesia Complications:         No immediate complications. Procedure:             Pre-Anesthesia Assessment:                        - Prior to the procedure, a History and Physical was                         performed, and patient medications and allergies were                         reviewed. The patient's tolerance of previous                         anesthesia was also reviewed. The risks and benefits                         of the procedure and the sedation options and risks                         were discussed with the patient. All questions were                         answered, and informed consent was obtained. Prior                         Anticoagulants: The patient has taken no anticoagulant                         or antiplatelet agents. ASA Grade Assessment: II - A                         patient with mild systemic disease. After reviewing                         the risks and benefits, the patient was deemed in                         satisfactory condition to undergo the procedure.                        After obtaining informed consent, the colonoscope was  passed under direct vision. Throughout the procedure,                         the patient's blood pressure, pulse, and oxygen                         saturations were monitored continuously. The                          Colonoscope was introduced through the anus and                         advanced to the the cecum, identified by appendiceal                         orifice and ileocecal valve. The colonoscopy was                         performed without difficulty. The patient tolerated                         the procedure well. The quality of the bowel                         preparation was good. Findings:      The perianal and digital rectal examinations were normal.      A 7 mm polyp was found in the rectum. The polyp was sessile. The polyp       was removed with a cold snare. Resection and retrieval were complete.      A 12 mm polyp was found in the ascending colon. The polyp was sessile.       Preparations were made for mucosal resection. Chromoscopy with indigo       carmine was done. Demarcation of the lesion was performed during the       procedure to clearly identify the boundaries of the lesion. Saline with       indigo carmine was injected to raise the lesion. Snare mucosal resection       was performed. Resection and retrieval were complete. To prevent       bleeding post-intervention, two hemostatic clips were successfully       placed (MR conditional). Clip manufacturer: [employer]. There was       no bleeding at the end of the procedure.      A 4 mm polyp was found in the ascending colon. The polyp was sessile.       The polyp was removed with a cold snare. Resection and retrieval were       complete.      Many small-mouthed diverticula were found in the entire colon.      A 5 mm polyp was found in the transverse colon. The polyp was sessile.       The polyp was removed with a cold snare. Resection and retrieval were       complete. Impression:            - One 7 mm polyp in the rectum, removed with a cold                         snare. Resected and retrieved.                        -  One 12 mm polyp in the ascending colon, removed with                          mucosal resection. Resected and retrieved. Clips (MR                         conditional) were placed. Clip manufacturer: [employer].                        - One 4 mm polyp in the ascending colon, removed with                         a cold snare. Resected and retrieved.                        - Diverticulosis in the entire examined colon.                        - One 5 mm polyp in the transverse colon, removed with                         a cold snare. Resected and retrieved.                        - Mucosal resection was performed. Resection and                         retrieval were complete. Recommendation:        - Discharge patient to home.                        - Resume previous diet.                        - Continue present medications.                        - Await pathology results. Procedure Code(s):     --- Professional ---                        [phone], Colonoscopy, flexible; with endoscopic mucosal                         resection                        45385, 14, Colonoscopy, flexible; with removal of                         tumor(s), polyp(s), or other lesion(s) by snare                         technique Diagnosis Code(s):     --- Professional ---                        Z86.010, Personal history of colonic polyps  D12.8, Benign neoplasm of rectum CPT copyright 2022 American Medical Association. All rights reserved. The codes documented in this report are preliminary and upon coder review may  be revised to meet current compliance requirements. Lucilla Lame MD, MD 03/24/2022 10:41:10 AM This report has been signed electronically. Number of Addenda: 0 Note Initiated On: 03/24/2022 9:47 AM Scope Withdrawal Time: 0 hours 24 minutes 41 seconds  Total Procedure Duration: 0 hours 36 minutes 7 seconds  Estimated Blood Loss:  Estimated blood loss: none.      [facility]

## 2022-03-24 NOTE — Transfer of Care (Signed)
Immediate Anesthesia Transfer of Care Note  Patient: Lisa [name]  Procedure(s) Performed: COLONOSCOPY WITH PROPOFOL  Patient Location: Endoscopy Unit  Anesthesia Type:General  Level of Consciousness: drowsy  Airway & Oxygen Therapy: Patient Spontanous Breathing  Post-op Assessment: Report given to RN and Post -op Vital signs reviewed and stable  Post vital signs: Reviewed and stable  Last Vitals: see EPIC flowsheet Vitals Value Taken Time  BP    Temp    Pulse    Resp    SpO2      Last Pain:  Vitals:   03/24/22 0859  TempSrc: Temporal  PainSc: 0-No pain         Complications: No notable events documented.

## 2022-03-24 NOTE — Anesthesia Preprocedure Evaluation (Signed)
Anesthesia Evaluation  Patient identified by MRN, date of birth, ID band Patient awake    Reviewed: Allergy & Precautions, NPO status , Patient's Chart, lab work & pertinent test results  Airway Mallampati: III  TM Distance: <3 FB Neck ROM: Full    Dental  (+) Teeth Intact   Pulmonary neg pulmonary ROS, COPD,  COPD inhaler, Patient abstained from smoking., former smoker   Pulmonary exam normal  + decreased breath sounds      Cardiovascular Exercise Tolerance: Good + CAD, + Past MI, + Cardiac Stents and + Peripheral Vascular Disease  negative cardio ROS Normal cardiovascular exam Rhythm:Regular     Neuro/Psych negative neurological ROS  negative psych ROS   GI/Hepatic negative GI ROS, Neg liver ROS,,,  Endo/Other  negative endocrine ROSdiabetes, Well Controlled, Type 1, Insulin Dependent    Renal/GU negative Renal ROS  negative genitourinary   Musculoskeletal negative musculoskeletal ROS (+)    Abdominal Normal abdominal exam  (+)   Peds negative pediatric ROS (+)  Hematology negative hematology ROS (+)   Anesthesia Other Findings Past Medical History: No date: CAD (coronary artery disease)     Comment:  a. 1999 PTCA @ [facility]; b. NSTEMI/Cath 10/11/2016: LM 20%,               oLAD 40%, p-mLAD 40%, dLAD 40%, p-mLCx 95%, pRCA 30%,               95/75m EF 55-65%; c. 10/2016 CT Surgery->rec PCI due to               poor lung fxn. d. 12/02/16 DES to mid RCA. No date: Groin hematoma     Comment:  a. 10/2016 Spont groin hematoma w/ anemia req 1u prbcs. No date: High cholesterol No date: History of kidney stones No date: Mucus plugging of bronchi     Comment:  a. 10/2016 RLL Bronchus obstruction->bronchus vs mass;                b. 10/2016 CT Chest: resolution of RLL mucous plug. 10/11/2016: NSTEMI (non-ST elevated myocardial infarction) ([place] No date: PAF (paroxysmal atrial fibrillation) ([place]     Comment:  a. 12/2016 Noted  during diag cath--CHA2DS2VASc = 3. No               OAC in setting of need for DAPT and h/o spont Thigh               Hematoma in 10/2016. No date: Pericardial effusion     Comment:  a. 10/2016 Echo: EF 60-65%, no rwma, Gr2 DD, small to mod              pericardial eff. No tamponade; b. 12/2016 Echo: EF               60-65%, no rwma, Gr1 DD, 1.2 to 1.4 cm mod to large               circumferential pericardial effusion w/o tamponade; c.               12/2016 Echo: EF 55-60%, no rwma, triv effusion. 02/2016: Pneumonia     Comment:  "couple times before 02/2016 too" (12/02/2016) No date: PVD (peripheral vascular disease) ([facility]     Comment:  a. Abdominal aortogram 10/11/16: Infrarenal abdominal               aorta heavily calcified & ectatic with approximately  40-50% stenosis at the bifurcation. Mild diffuse disease               w/ heavy calcification noted in the common & external               iliac arteries bilaterally No date: Type I diabetes mellitus ([place])     Comment:  a. On insulin pump.(12/02/2016) No date: Ventricular fibrillation [facility])     Comment:  a. 10/11/2016: Felt to be secondary to demand ischemia in              the setting of underlying CAD and anaphylactic reaction               from IV Rocephin; b. 10/2016 Echo: EF 55-60%, ? mild inf               HK; c. 10/2016 Seen by EP: No ICD indication.  Past Surgical History: 10/11/2016: ABDOMINAL AORTOGRAM; N/A     Comment:  Procedure: ABDOMINAL AORTOGRAM;  Surgeon: Nelva Bush, MD;  Location: [place] CV [facility];                Service: Cardiovascular;  Laterality: N/A; 10/2016: CARDIAC CATHETERIZATION 03/05/2012: CERVICAL BIOPSY  W/ LOOP ELECTRODE EXCISION No date: COLONOSCOPY 06/27/2018: COLONOSCOPY WITH PROPOFOL; N/A     Comment:  Procedure: COLONOSCOPY WITH PROPOFOL;  Surgeon: Robert Bellow, MD;  Location: [facility] ENDOSCOPY;  Service:               Endoscopy;  Laterality:  N/A; 01/17/2011: COLPOSCOPY 1999; 12/02/2016: CORONARY ANGIOPLASTY WITH STENT PLACEMENT     Comment:  "1 stent  + 1 stent" 12/02/2016: CORONARY ATHERECTOMY; N/A     Comment:  Procedure: CORONARY ATHERECTOMY - CSI;  Surgeon: Nelva Bush, MD;  Location: [place] CV [facility];  Service:              Cardiovascular;  Laterality: N/A; 12/02/2016: CORONARY STENT INTERVENTION; N/A     Comment:  Procedure: CORONARY STENT INTERVENTION;  Surgeon: Nelva Bush, MD;  Location: [place] CV [facility];  Service:              Cardiovascular;  Laterality: N/A; 10/11/2016: LEFT HEART CATH AND CORONARY ANGIOGRAPHY; N/A     Comment:  Procedure: LEFT HEART CATH AND CORONARY ANGIOGRAPHY;                Surgeon: Nelva Bush, MD;  Location: [place]               CV LAB;  Service: Cardiovascular;  Laterality: N/A; ~ 2009: NASAL SINUS SURGERY 12/02/2016: TEMPORARY PACEMAKER; N/A     Comment:  Procedure: TEMPORARY PACEMAKER;  Surgeon: Nelva Bush, MD;  Location: [place] CV LAB;  Service:              Cardiovascular;  Laterality: N/A; No date: TONSILLECTOMY  BMI    Body Mass Index: 23.67 kg/m      Reproductive/Obstetrics negative OB ROS  Anesthesia Physical Anesthesia Plan  ASA: 3  Anesthesia Plan: General   Post-op Pain Management:    Induction: Intravenous  PONV Risk Score and Plan: Propofol infusion and TIVA  Airway Management Planned: Natural Airway and Nasal Cannula  Additional Equipment:   Intra-op Plan:   Post-operative Plan:   Informed Consent: I have reviewed the patients History and Physical, chart, labs and discussed the procedure including the risks, benefits and alternatives for the proposed anesthesia with the patient or authorized representative who has indicated his/her understanding and acceptance.     Dental Advisory Given  Plan Discussed with: CRNA and  Surgeon  Anesthesia Plan Comments:        Anesthesia Quick Evaluation

## 2022-03-25 ENCOUNTER — Encounter: Payer: Self-pay | Admitting: Gastroenterology

## 2022-03-25 LAB — SURGICAL PATHOLOGY

## 2022-03-31 ENCOUNTER — Telehealth: Payer: Self-pay

## 2022-03-31 NOTE — Telephone Encounter (Signed)
I spoke to pt and answered her questions regarding her pathology report... nothing further needed

## 2022-03-31 NOTE — Telephone Encounter (Signed)
Left message on voicemail with results from pathology report and colonoscopy

## 2022-04-21 ENCOUNTER — Encounter: Payer: Self-pay | Admitting: Internal Medicine

## 2022-04-21 ENCOUNTER — Telehealth: Payer: Self-pay

## 2022-04-21 ENCOUNTER — Ambulatory Visit: Payer: Medicare HMO | Attending: Internal Medicine | Admitting: Internal Medicine

## 2022-04-21 VITALS — BP 124/74 | HR 73 | Ht 62.0 in | Wt 133.0 lb

## 2022-04-21 DIAGNOSIS — I714 Abdominal aortic aneurysm, without rupture, unspecified: Secondary | ICD-10-CM | POA: Diagnosis not present

## 2022-04-21 DIAGNOSIS — I700 Atherosclerosis of aorta: Secondary | ICD-10-CM

## 2022-04-21 DIAGNOSIS — I25118 Atherosclerotic heart disease of native coronary artery with other forms of angina pectoris: Secondary | ICD-10-CM

## 2022-04-21 DIAGNOSIS — I480 Paroxysmal atrial fibrillation: Secondary | ICD-10-CM | POA: Diagnosis not present

## 2022-04-21 DIAGNOSIS — E1069 Type 1 diabetes mellitus with other specified complication: Secondary | ICD-10-CM

## 2022-04-21 DIAGNOSIS — E782 Mixed hyperlipidemia: Secondary | ICD-10-CM

## 2022-04-21 MED ORDER — NITROGLYCERIN 0.4 MG SL SUBL: 0.4000 mg | SUBLINGUAL_TABLET | SUBLINGUAL | 3 refills | Status: DC | PRN

## 2022-04-21 NOTE — Telephone Encounter (Signed)
Recent lab work requested from pcp for review.

## 2022-04-21 NOTE — Patient Instructions (Addendum)
Medication Instructions:  Your Physician recommend you continue on your current medication as directed.    *If you need a refill on your cardiac medications before your next appointment, please call your pharmacy*   Lab Work: None ordered today   Testing/Procedures: Your physician has requested that you have an abdominal aorta duplex in June, 2024. During this test, an ultrasound is used to evaluate the aorta. Allow 30 minutes for this exam. Do not eat after midnight the day before and avoid carbonated beverages  This will take place at [facility] [phone], [place]    Follow-Up: At [facility], you and your health needs are our priority.  As part of our continuing mission to provide you with exceptional heart care, we have created designated Provider Care Teams.  These Care Teams include your primary Cardiologist (physician) and Advanced Practice Providers (APPs -  Physician Assistants and Nurse Practitioners) who all work together to provide you with the care you need, when you need it.  We recommend signing up for the patient portal called "MyChart".  Sign up information is provided on this After Visit Summary.  MyChart is used to connect with patients for Virtual Visits (Telemedicine).  Patients are able to view lab/test results, encounter notes, upcoming appointments, etc.  Non-urgent messages can be sent to your provider as well.   To learn more about what you can do with MyChart, go to [email or web address].    Your next appointment:   6 month(s)  Provider:   You may see Nelva Bush, MD or one of the following Advanced Practice Providers on your designated Care Team:   Murray Hodgkins, NP Christell Faith, PA-C Cadence Kathlen Mody, PA-C Gerrie Nordmann, NP

## 2022-04-21 NOTE — Progress Notes (Signed)
Follow-up Outpatient Visit Date: 04/21/2022  Primary Care Provider: Lenard Simmer, MD [place] [address]  Chief Complaint: Coronary artery disease and abdominal aortic aneurysm  HPI:  Lisa [name] is a 72 y.o. female with history of coronary artery disease status post remote coronary intervention at [facility] (the late 90s) and cardiac arrest in the setting of anaphylaxis (10/2016) with catheterization demonstrating severe 2-vessel CAD (subsequent atherectomy and PCI to RCA), recurrent pericardial effusion, type 1 diabetes mellitus, peripheral vascular disease with AAA, right lower lobe pneumonia with mucous plugging, and spontaneous right thigh hematoma in the setting of anticoagulation with heparin, who presents for follow-up of coronary artery disease.  She was last seen in our office in 10/2021 by Gerrie Nordmann, NP, at which time she was doing well for from a heart standpoint.  Today, Lisa [name] continues to feel well, denying chest pain, shortness of breath, palpitations, lightheadedness, and edema.  Her only complaint is of occasional leg cramps at night.  She is also working with a new insulin pump and its integration with her continuous glucose monitor.  She notes that her last hemoglobin A1c was slightly elevated, though her glycemic control seems to be improving.  --------------------------------------------------------------------------------------------------  Cardiovascular History & Procedures: Cardiovascular Problems: Coronary artery disease with VF arrest and transient complete heart block in the setting of anaphylactic shock Recurrent pericardial effusion Paroxysmal atrial fibrillation Mild dilation of abdominal aorta   Risk Factors: Known coronary artery disease, peripheral vascular disease, and type 1 diabetes mellitus   Cath/PCI: PCI (12/02/16): Successful orbital atherectomy and PCI to heavily calcified mid RCA disease with placement of a Xience Alpine  3.0 x 33 mm drug-eluting stent. LHC (10/11/16): Severe two-vessel coronary artery disease including heavily calcified 95% proximal/mid LCx and sequential 90-99% mid RCA stenoses. Mild to moderate, nonobstructive disease involving the LMCA and LAD. Basal inferior hypokinesis with overall preserved LV contraction. Mildly elevated LVEDP. Peripheral vascular disease with ectasia and calcification of the infrarenal abdominal aorta and iliac arteries.   CV Surgery: None   EP Procedures and Devices: None   Non-Invasive Evaluation(s): AAA duplex (07/13/2021): Slight interval enlargement of proximal and mid abdominal aorta, measuring up to 3.0 cm (previously 2.8 cm). AAA duplex (06/25/2020): Stable mild dilation of proximal and mid abdominal aorta, measuring up to 2.7 cm. Limited TTE (01/19/17): Trivial pericardial effusion, significantly decreased in size from prior study. LVEF 55-60%. Limited TTE (12/13/16): Normal LV size with LVEF of 65-70%.  Normal RV size and function.  Moderate to large circumferential pericardial effusion, stable to slightly decreased in size compared with 12/09/16.  No evidence of tamponade physiology. TTE (10/25/16): Normal LV size with mild to moderate LVH. LVEF 60-65% with grade 2 diastolic dysfunction. RV size and function. Normal PA pressure. Small-to-moderate pericardial effusion. TTE (10/12/16): Normal LV size. LVEF 55-60%. Normal diastolic function. Normal RV size and function. Normal PA pressure.  Recent CV Pertinent Labs: Lab Results  Component Value Date   CHOL 81 10/13/2016   HDL 34 (L) 10/13/2016   LDLCALC 36 10/13/2016   TRIG 56 10/13/2016   CHOLHDL 2.4 10/13/2016   INR 0.94 11/23/2016   BNP 69.0 10/25/2016   K 4.2 05/13/2017   MG 1.6 (L) 12/08/2016   BUN 17 05/13/2017   CREATININE 0.85 05/13/2017    Past medical and surgical history were reviewed and updated in EPIC.  Current Meds  Medication Sig   ALPRAZolam (XANAX) 0.25 MG tablet Take 0.25 mg by mouth  2 (two) times  daily as needed (for anxiety.).    aspirin EC 81 MG tablet Take 81 mg by mouth daily.   atorvastatin (LIPITOR) 40 MG tablet Take 40 mg by mouth at bedtime.    diltiazem (CARDIZEM CD) 240 MG 24 hr capsule TAKE 1 CAPSULE BY MOUTH EVERY DAY   insulin aspart (NOVOLOG) cartridge Inject into the skin 3 (three) times daily with meals.   nitroGLYCERIN (NITROSTAT) 0.4 MG SL tablet Place 1 tablet (0.4 mg total) under the tongue every 5 (five) minutes x 3 doses as needed for chest pain.   ONETOUCH VERIO test strip USE TO TEST FOUR TIMES DAILY   Vitamin D, Ergocalciferol, (DRISDOL) 50000 units CAPS capsule Take 50,000 Units by mouth every 30 (thirty) days.     Allergies: Ceftriaxone, Penicillins, Rocephin [ceftriaxone sodium in dextrose], and Albuterol  Social History   Tobacco Use   Smoking status: Former    Packs/day: 0.25    Years: 47.00    Additional pack years: 0.00    Total pack years: 11.75    Types: Cigarettes    Start date: 01/25/1969    Quit date: 10/11/2016    Years since quitting: 5.5   Smokeless tobacco: Never  Vaping Use   Vaping Use: Former  Substance Use Topics   Alcohol use: No    Comment: 1 glass of wine per night   Drug use: No    Family History  Problem Relation Age of Onset   Hypertension Mother    Aortic aneurysm Father    Hypertension Brother    Lung disease Neg Hx    Rheumatologic disease Neg Hx     Review of Systems: A 12-system review of systems was performed and was negative except as noted in the HPI.  --------------------------------------------------------------------------------------------------  Physical Exam: BP 124/74 (BP Location: Left Arm, Patient Position: Sitting, Cuff Size: Normal)   Pulse 73   Ht 5\' 2"  (1.575 m)   Wt 133 lb (60.3 kg)   SpO2 96%   BMI 24.33 kg/m   General:  NAD. Neck: No JVD or HJR. Lungs: Clear to auscultation bilaterally without wheezes or crackles. Heart: Regular rate and rhythm without murmurs,  rubs, or gallops. Abdomen: Soft, nontender, nondistended. Extremities: No lower extremity edema.  EKG: Normal sinus rhythm without abnormalities.  Lab Results  Component Value Date   WBC 6.9 05/13/2017   HGB 12.1 05/13/2017   HCT 36.8 05/13/2017   MCV 82.2 05/13/2017   PLT 265 05/13/2017    Lab Results  Component Value Date   NA 131 (L) 05/13/2017   K 4.2 05/13/2017   CL 100 (L) 05/13/2017   CO2 26 05/13/2017   BUN 17 05/13/2017   CREATININE 0.85 05/13/2017   GLUCOSE 94 05/13/2017   ALT 20 05/07/2017    Lab Results  Component Value Date   CHOL 81 10/13/2016   HDL 34 (L) 10/13/2016   LDLCALC 36 10/13/2016   TRIG 56 10/13/2016   CHOLHDL 2.4 10/13/2016    --------------------------------------------------------------------------------------------------  ASSESSMENT AND PLAN: Coronary artery disease with stable angina: Lisa [name] continues to do well without angina.  We will continue with aggressive secondary prevention including aspirin and atorvastatin, as well as antianginal therapy with diltiazem.  I will request most recent lab results from Dr. Ronnald Collum to ensure that lipids are at goal.  Paroxysmal atrial fibrillation: No evidence of recurrent atrial fibrillation.  Only episode of documented atrial fibrillation occurred in the setting of anaphylaxis and STEMI.  Given spontaneous thigh hematoma while on  heparin, we will defer anticoagulation.  Continue diltiazem 240 mg daily.  Abdominal aortic aneurysm and aortic atherosclerosis: Most recent aortic duplex in 07/2021 showed slight interval enlargement of AAA, up to 3.0 cm.  We will continue aspirin and statin therapy with repeat duplex this summer.  Hyperlipidemia associated with type 1 diabetes mellitus: Continue atorvastatin 40 mg nightly and insulin infusion per Dr. Ronnald Collum.  We will request most recent laboratory results from Dr. De [place]ings[place] office to ensure LDL is at goal (less than 70, ideally below  55).  Follow-up: Return to clinic in 6 months.  Nelva Bush, MD 04/21/2022 9:16 AM

## 2022-04-23 ENCOUNTER — Encounter: Payer: Self-pay | Admitting: Internal Medicine

## 2022-05-24 ENCOUNTER — Ambulatory Visit: Payer: Medicare HMO | Admitting: Gastroenterology

## 2022-06-02 LAB — LAB REPORT - SCANNED
A1c: 8.2
EGFR: 74

## 2022-06-20 NOTE — Progress Notes (Unsigned)
06/21/2022 9:20 PM   Lisa [name] 03/29/1950 [id number]  Referring provider: Alan Mulder, MD [address] [place],  [place] [address]  Urological history: 1. High risk hematuria -former smoker -CTU (2019) - no GU malignancies identified -cysto (2019) - NED  2. rUTI's -contributing factors of age, GSM, incomplete bladder emptying and diabetes  3. Nephrolithiasis -CTU (2019) - Two small left renal calculi but no obstructing ureteral calculi or bladder calculi.   Right-sided hydroureteronephrosis seen on the prior CT scan has resolved.  No chief complaint on file.   HPI: Lisa [name] is a 72 y.o. female who presents today for bladder dropped.  Previous records reviewed.   PVR ***  PMH: Past Medical History:  Diagnosis Date   CAD (coronary artery disease)    a. 1999 PTCA @ [facility]; b. NSTEMI/Cath 10/11/2016: LM 20%, oLAD 40%, p-mLAD 40%, dLAD 40%, p-mLCx 95%, pRCA 30%, 95/85m, EF 55-65%; c. 10/2016 CT Surgery->rec PCI due to poor lung fxn. d. 12/02/16 DES to mid RCA.   Groin hematoma    a. 10/2016 Spont groin hematoma w/ anemia req 1u prbcs.   High cholesterol    History of kidney stones    Mucus plugging of bronchi    a. 10/2016 RLL Bronchus obstruction->bronchus vs mass;  b. 10/2016 CT Chest: resolution of RLL mucous plug.   NSTEMI (non-ST elevated myocardial infarction) ([facility]) 10/11/2016   PAF (paroxysmal atrial fibrillation) ([facility])    a. 12/2016 Noted during diag cath--CHA2DS2VASc = 3. No OAC in setting of need for DAPT and h/o spont Thigh Hematoma in 10/2016.   Pericardial effusion    a. 10/2016 Echo: EF 60-65%, no rwma, Gr2 DD, small to mod pericardial eff. No tamponade; b. 12/2016 Echo: EF 60-65%, no rwma, Gr1 DD, 1.2 to 1.4 cm mod to large circumferential pericardial effusion w/o tamponade; c. 12/2016 Echo: EF 55-60%, no rwma, triv effusion.   Pneumonia 02/2016   "couple times before 02/2016 too" (12/02/2016)   PVD (peripheral vascular disease) ([facility])    a.  Abdominal aortogram 10/11/16: Infrarenal abdominal aorta heavily calcified & ectatic with approximately 40-50% stenosis at the bifurcation. Mild diffuse disease w/ heavy calcification noted in the common & external iliac arteries bilaterally   Type I diabetes mellitus ([facility])    a. On insulin pump.(12/02/2016)   Ventricular fibrillation ([facility])    a. 10/11/2016: Felt to be secondary to demand ischemia in the setting of underlying CAD and anaphylactic reaction from IV Rocephin; b. 10/2016 Echo: EF 55-60%, ? mild inf HK; c. 10/2016 Seen by EP: No ICD indication.    Surgical History: Past Surgical History:  Procedure Laterality Date   ABDOMINAL AORTOGRAM N/A 10/11/2016   Procedure: ABDOMINAL AORTOGRAM;  Surgeon: Yvonne Kendall, MD;  Location: [facility] INVASIVE CV LAB;  Service: Cardiovascular;  Laterality: N/A;   CARDIAC CATHETERIZATION  10/2016   CERVICAL BIOPSY  W/ LOOP ELECTRODE EXCISION  03/05/2012   COLONOSCOPY     COLONOSCOPY WITH PROPOFOL N/A 06/27/2018   Procedure: COLONOSCOPY WITH PROPOFOL;  Surgeon: Earline Mayotte, MD;  Location: [facility] ENDOSCOPY;  Service: Endoscopy;  Laterality: N/A;   COLONOSCOPY WITH PROPOFOL N/A 03/24/2022   Procedure: COLONOSCOPY WITH PROPOFOL;  Surgeon: Midge Minium, MD;  Location: [facility] ENDOSCOPY;  Service: Endoscopy;  Laterality: N/A;   COLPOSCOPY  01/17/2011   CORONARY ANGIOPLASTY WITH STENT PLACEMENT  1999; 12/02/2016   "1 stent  + 1 stent"   CORONARY ATHERECTOMY N/A 12/02/2016   Procedure: CORONARY ATHERECTOMY - CSI;  Surgeon:  End, Cristal Deer, MD;  [facility];  Service: Cardiovascular;  Laterality: N/A;   CORONARY STENT INTERVENTION N/A 12/02/2016   Procedure: CORONARY STENT INTERVENTION;  Surgeon: Yvonne Kendall, MD;  Location: [facility];  Service: Cardiovascular;  Laterality: N/A;   LEFT HEART CATH AND CORONARY ANGIOGRAPHY N/A 10/11/2016   Procedure: LEFT HEART CATH AND CORONARY ANGIOGRAPHY;  Surgeon: Yvonne Kendall, MD;  Location: [facility]  INVASIVE CV LAB;  Service: Cardiovascular;  Laterality: N/A;   NASAL SINUS SURGERY  ~ 2009   TEMPORARY PACEMAKER N/A 12/02/2016   Procedure: TEMPORARY PACEMAKER;  Surgeon: Yvonne Kendall, MD;  [facility];  Service: Cardiovascular;  Laterality: N/A;   TONSILLECTOMY      Home Medications:  Allergies as of 06/21/2022       Reactions   Ceftriaxone    Anaphylaxis, cardiac arrest   Penicillins Anaphylaxis   Rocephin [ceftriaxone Sodium In Dextrose] Anaphylaxis   Albuterol Other (See Comments)   Tachycardia-->Afib. Tolerates Xopenex        Medication List        Accurate as of Jun 20, 2022  9:20 PM. If you have any questions, ask your nurse or doctor.          ALPRAZolam 0.25 MG tablet Commonly known as: XANAX Take 0.25 mg by mouth 2 (two) times daily as needed (for anxiety.).   aspirin EC 81 MG tablet Take 81 mg by mouth daily.   atorvastatin 40 MG tablet Commonly known as: LIPITOR Take 40 mg by mouth at bedtime.   diltiazem 240 MG 24 hr capsule Commonly known as: CARDIZEM CD TAKE 1 CAPSULE BY MOUTH EVERY DAY   insulin aspart cartridge Commonly known as: NOVOLOG Inject into the skin 3 (three) times daily with meals.   nitroGLYCERIN 0.4 MG SL tablet Commonly known as: NITROSTAT Place 1 tablet (0.4 mg total) under the tongue every 5 (five) minutes x 3 doses as needed for chest pain.   OneTouch Verio test strip Generic drug: glucose blood USE TO TEST FOUR TIMES DAILY   Vitamin D (Ergocalciferol) 1.25 MG (50000 UNIT) Caps capsule Commonly known as: DRISDOL Take 50,000 Units by mouth every 30 (thirty) days.        Allergies:  Allergies  Allergen Reactions   Ceftriaxone     Anaphylaxis, cardiac arrest   Penicillins Anaphylaxis   Rocephin [Ceftriaxone Sodium In Dextrose] Anaphylaxis   Albuterol Other (See Comments)    Tachycardia-->Afib. Tolerates Xopenex    Family History: Family History  Problem Relation Age of Onset    Hypertension Mother    Aortic aneurysm Father    Hypertension Brother    Lung disease Neg Hx    Rheumatologic disease Neg Hx     Social History:  reports that she quit smoking about 5 years ago. Her smoking use included cigarettes. She started smoking about 53 years ago. She has a 11.75 pack-year smoking history. She has never used smokeless tobacco. She reports that she does not drink alcohol and does not use drugs.  ROS: Pertinent ROS in HPI  Physical Exam: There were no vitals taken for this visit.  Constitutional:  Well nourished. Alert and oriented, No acute distress. HEENT: [place] AT, moist mucus membranes.  Trachea midline, no masses. Cardiovascular: No clubbing, cyanosis, or edema. Respiratory: Normal respiratory effort, no increased work of breathing. GU: No CVA tenderness.  No bladder fullness or masses. Vulvovaginal atrophy w/ pallor, loss of rugae, introital retraction, excoriations.  Vulvar thinning, fusion  of labia, clitoral hood retraction, prominent urethral meatus.   *** external genitalia, *** pubic hair distribution, no lesions.  Normal urethral meatus, no lesions, no prolapse, no discharge.   No urethral masses, tenderness and/or tenderness. No bladder fullness, tenderness or masses. *** vagina mucosa, *** estrogen effect, no discharge, no lesions, *** pelvic support, *** cystocele and *** rectocele noted.  No cervical motion tenderness.  Uterus is freely mobile and non-fixed.  No adnexal/parametria masses or tenderness noted.  Anus and perineum are without rashes or lesions.   ***  Neurologic: Grossly intact, no focal deficits, moving all 4 extremities. Psychiatric: Normal mood and affect.    Laboratory Data: N/A  Pertinent Imaging: ***  Assessment & Plan:    1. Cystocele ***  No follow-ups on file.  These notes generated with voice recognition software. I apologize for typographical errors.  Cloretta Ned  [facility] [address]  [address] [place], [place] [address] [phone]

## 2022-06-21 ENCOUNTER — Ambulatory Visit (INDEPENDENT_AMBULATORY_CARE_PROVIDER_SITE_OTHER): Payer: Medicare HMO | Admitting: Urology

## 2022-06-21 VITALS — BP 145/77 | HR 76 | Ht 62.5 in | Wt 129.0 lb

## 2022-06-21 DIAGNOSIS — N8111 Cystocele, midline: Secondary | ICD-10-CM | POA: Diagnosis not present

## 2022-06-21 LAB — BLADDER SCAN AMB NON-IMAGING: Scan Result: 203

## 2022-06-23 ENCOUNTER — Other Ambulatory Visit: Payer: Self-pay

## 2022-06-23 ENCOUNTER — Encounter: Payer: Self-pay | Admitting: Urology

## 2022-06-23 MED ORDER — DILTIAZEM HCL ER COATED BEADS 240 MG PO CP24: 240.0000 mg | ORAL_CAPSULE | Freq: Every day | ORAL | 0 refills | Status: DC

## 2022-07-12 ENCOUNTER — Ambulatory Visit: Payer: Medicare HMO | Attending: Internal Medicine

## 2022-07-12 DIAGNOSIS — I7142 Juxtarenal abdominal aortic aneurysm, without rupture: Secondary | ICD-10-CM

## 2022-07-12 DIAGNOSIS — I714 Abdominal aortic aneurysm, without rupture, unspecified: Secondary | ICD-10-CM

## 2022-07-19 ENCOUNTER — Other Ambulatory Visit: Payer: Self-pay

## 2022-07-19 DIAGNOSIS — I714 Abdominal aortic aneurysm, without rupture, unspecified: Secondary | ICD-10-CM

## 2022-09-08 LAB — CBC AND DIFFERENTIAL
A1c: 8
EGFR: 74
HCT: 41 (ref 36–46)
Hemoglobin: 13.5 (ref 12.0–16.0)
Neutrophils Absolute: 3.8
Platelets: 263 10*3/uL (ref 150–400)
WBC: 8.5

## 2022-09-08 LAB — HEMOGLOBIN A1C: Hemoglobin A1C: 8

## 2022-09-08 LAB — COMPREHENSIVE METABOLIC PANEL
Albumin: 4.7 (ref 3.5–5.0)
Calcium: 9.6 (ref 8.7–10.7)
eGFR: 74

## 2022-09-08 LAB — BASIC METABOLIC PANEL
BUN: 18 (ref 4–21)
Chloride: 98 — AB (ref 99–108)
Creatinine: 0.9 (ref 0.5–1.1)
Glucose: 135
Potassium: 4.8 meq/L (ref 3.5–5.1)
Sodium: 138 (ref 137–147)

## 2022-09-08 LAB — LIPID PANEL
Cholesterol: 144 (ref 0–200)
HDL: 75 — AB (ref 35–70)
LDL Cholesterol: 55
Triglycerides: 74 (ref 40–160)

## 2022-09-08 LAB — TSH: TSH: 1.82 (ref 0.41–5.90)

## 2022-09-08 LAB — HEPATIC FUNCTION PANEL
ALT: 20 U/L (ref 7–35)
AST: 29 (ref 13–35)
Alkaline Phosphatase: 130 — AB (ref 25–125)
Bilirubin, Total: 0.4

## 2022-09-08 LAB — VITAMIN D 25 HYDROXY (VIT D DEFICIENCY, FRACTURES): Vit D, 25-Hydroxy: 79.7

## 2022-09-08 LAB — CBC: RBC: 4.93 (ref 3.87–5.11)

## 2022-09-08 LAB — VITAMIN B12: Vitamin B-12: 266

## 2022-09-08 LAB — IRON,TIBC AND FERRITIN PANEL: Iron: 112

## 2022-09-21 ENCOUNTER — Other Ambulatory Visit: Payer: Self-pay | Admitting: Internal Medicine

## 2022-10-04 ENCOUNTER — Ambulatory Visit: Payer: Medicare HMO | Admitting: Family Medicine

## 2022-10-07 ENCOUNTER — Encounter: Payer: Self-pay | Admitting: Family Medicine

## 2022-10-07 ENCOUNTER — Ambulatory Visit: Payer: Medicare HMO | Admitting: Family Medicine

## 2022-10-07 VITALS — BP 118/64 | HR 79 | Temp 97.9°F | Resp 16 | Ht 62.5 in | Wt 131.0 lb

## 2022-10-07 DIAGNOSIS — E108 Type 1 diabetes mellitus with unspecified complications: Secondary | ICD-10-CM | POA: Diagnosis not present

## 2022-10-07 DIAGNOSIS — Z23 Encounter for immunization: Secondary | ICD-10-CM

## 2022-10-07 DIAGNOSIS — I480 Paroxysmal atrial fibrillation: Secondary | ICD-10-CM

## 2022-10-07 DIAGNOSIS — F39 Unspecified mood [affective] disorder: Secondary | ICD-10-CM

## 2022-10-07 DIAGNOSIS — I700 Atherosclerosis of aorta: Secondary | ICD-10-CM | POA: Diagnosis not present

## 2022-10-07 DIAGNOSIS — I714 Abdominal aortic aneurysm, without rupture, unspecified: Secondary | ICD-10-CM

## 2022-10-07 DIAGNOSIS — M810 Age-related osteoporosis without current pathological fracture: Secondary | ICD-10-CM

## 2022-10-07 NOTE — Progress Notes (Unsigned)
SUBJECTIVE:   Chief Complaint  Patient presents with  . Establish Care   HPI Presents to clinic to establish care  No acute concerns  DM Type 1    PERTINENT PMH / PSH: ***  OBJECTIVE:  BP 118/64   Pulse 79   Temp 97.9 F (36.6 C)   Resp 16   Ht 5' 2.5" (1.588 m)   Wt 131 lb (59.4 kg)   SpO2 96%   BMI 23.58 kg/m    Physical Exam Vitals reviewed.  Constitutional:      General: She is not in acute distress.    Appearance: She is not ill-appearing.  HENT:     Head: Normocephalic.     Right Ear: Tympanic membrane, ear canal and external ear normal.     Left Ear: Tympanic membrane, ear canal and external ear normal.     Nose: Nose normal.     Mouth/Throat:     Mouth: Mucous membranes are moist.  Eyes:     Extraocular Movements: Extraocular movements intact.     Conjunctiva/sclera: Conjunctivae normal.     Pupils: Pupils are equal, round, and reactive to light.  Neck:     Thyroid: No thyromegaly or thyroid tenderness.     Vascular: No carotid bruit.  Cardiovascular:     Rate and Rhythm: Normal rate and regular rhythm.     Pulses: Normal pulses.     Heart sounds: Normal heart sounds.  Pulmonary:     Effort: Pulmonary effort is normal.     Breath sounds: Normal breath sounds.  Abdominal:     General: Bowel sounds are normal. There is no distension.     Palpations: Abdomen is soft.     Tenderness: There is no abdominal tenderness. There is no right CVA tenderness, left CVA tenderness, guarding or rebound.  Musculoskeletal:        General: Normal range of motion.     Cervical back: Normal range of motion.     Right lower leg: No edema.     Left lower leg: No edema.  Lymphadenopathy:     Cervical: No cervical adenopathy.  Skin:    Capillary Refill: Capillary refill takes less than 2 seconds.  Neurological:     General: No focal deficit present.     Mental Status: She is alert and oriented to person, place, and time. Mental status is at baseline.      Motor: No weakness.  Psychiatric:        Mood and Affect: Mood normal.        Behavior: Behavior normal.        Thought Content: Thought content normal.        Judgment: Judgment normal.       10/07/2022   10:41 AM 12/16/2016    2:13 PM  Depression screen PHQ 2/9  Decreased Interest 0 1  Down, Depressed, Hopeless 0 0  PHQ - 2 Score 0 1  Altered sleeping 0 0  Tired, decreased energy 0 3  Change in appetite 0 2  Feeling bad or failure about yourself  0 0  Trouble concentrating 0 0  Moving slowly or fidgety/restless 0 0  Suicidal thoughts 0 0  PHQ-9 Score 0 6  Difficult doing work/chores Not difficult at all Somewhat difficult      10/07/2022   10:42 AM  GAD 7 : Generalized Anxiety Score  Nervous, Anxious, on Edge 0  Control/stop worrying 0  Worry too much - different things 0  Trouble relaxing 0  Restless 0  Easily annoyed or irritable 0  Afraid - awful might happen 0  Total GAD 7 Score 0  Anxiety Difficulty Not difficult at all    ASSESSMENT/PLAN:  There are no diagnoses linked to this encounter. PDMP reviewed***  No follow-ups on file.  Dana Allan, MD

## 2022-10-07 NOTE — Patient Instructions (Addendum)
It was a pleasure meeting you today. Thank you for allowing me to take part in your health care.  Our goals for today as we discussed include:  Flu vaccine today  Recommend Tetanus Vaccination.  This is given every 10 years.   Recommend Shingles vaccine.  This is a 2 dose series and can be given at your local pharmacy.  Please talk to your pharmacist about this.   You have received 2 pneumonia vaccines and have completed series.    If wanting another recommend Pneumonia 20 and will not need any further vaccines.   Follow up with Endocrinology as scheduled  [facility]  [address]  [place] [address] [phone]    [email or web address] [address] [place] [phone]   [email or web address]  [email or web address]  Follow up in 6 months  If you have any questions or concerns, please do not hesitate to call the office at [phone].  I look forward to our next visit and until then take care and stay safe.  Regards,   Dana Allan, MD   [facility]

## 2022-10-23 ENCOUNTER — Encounter: Payer: Self-pay | Admitting: Family Medicine

## 2022-10-23 DIAGNOSIS — M810 Age-related osteoporosis without current pathological fracture: Secondary | ICD-10-CM | POA: Insufficient documentation

## 2022-10-23 DIAGNOSIS — Z23 Encounter for immunization: Secondary | ICD-10-CM | POA: Insufficient documentation

## 2022-10-23 DIAGNOSIS — Z1231 Encounter for screening mammogram for malignant neoplasm of breast: Secondary | ICD-10-CM | POA: Insufficient documentation

## 2022-10-23 DIAGNOSIS — F39 Unspecified mood [affective] disorder: Secondary | ICD-10-CM | POA: Insufficient documentation

## 2022-10-23 NOTE — Assessment & Plan Note (Signed)
Chronic. Denies any SI/HI.   Takes Xanax 0.25 mg daily Does not require refill today Discussed other options for management of anxiety. Discussed risks of long term use of benzos.  Patient will decide if wanting to stay on medication Will need non opioid contract and UDS at next visit or before next refill. Encouraged CBT Mental health resources provided

## 2022-10-23 NOTE — Assessment & Plan Note (Signed)
Chronic.  3.2 cm dilation. Asymptomatic BP well controlled Follows with Cardiology, repeat imaging in 1 year.

## 2022-10-23 NOTE — Assessment & Plan Note (Signed)
Chronic.  Tried 2 years of Forteo, Reclast, Fosamax in past. Recent Dexa improved, showing stable osteopenia On Prolia 60 mg injections q Repeat DEXA in 2 years

## 2022-10-23 NOTE — Assessment & Plan Note (Signed)
Chronic Recent LDL 55 Continue Lipitor 40 mg daily Repeat Lipids annually

## 2022-10-23 NOTE — Assessment & Plan Note (Signed)
Chronic Recent A1c 8.0 Insulin pump Dexcom Follow up with Endocrinology, Dr Shawnee Knapp  Request eye exam records Foot exam annually

## 2022-10-23 NOTE — Assessment & Plan Note (Signed)
Asymptomatic.  Rate controlled with CCB Not on anticoagulation Continue Cardizem CD 240 mg daily  Follow up with Cardiology as scheduled

## 2022-10-26 ENCOUNTER — Ambulatory Visit: Payer: Medicare HMO | Attending: Internal Medicine | Admitting: Internal Medicine

## 2022-10-26 ENCOUNTER — Encounter: Payer: Self-pay | Admitting: Internal Medicine

## 2022-10-26 VITALS — BP 132/74 | HR 73 | Ht 62.5 in | Wt 129.2 lb

## 2022-10-26 DIAGNOSIS — E1059 Type 1 diabetes mellitus with other circulatory complications: Secondary | ICD-10-CM | POA: Diagnosis not present

## 2022-10-26 DIAGNOSIS — I10 Essential (primary) hypertension: Secondary | ICD-10-CM | POA: Insufficient documentation

## 2022-10-26 DIAGNOSIS — I480 Paroxysmal atrial fibrillation: Secondary | ICD-10-CM | POA: Diagnosis not present

## 2022-10-26 DIAGNOSIS — E785 Hyperlipidemia, unspecified: Secondary | ICD-10-CM | POA: Diagnosis not present

## 2022-10-26 DIAGNOSIS — I714 Abdominal aortic aneurysm, without rupture, unspecified: Secondary | ICD-10-CM

## 2022-10-26 DIAGNOSIS — I2510 Atherosclerotic heart disease of native coronary artery without angina pectoris: Secondary | ICD-10-CM

## 2022-10-26 DIAGNOSIS — Z79899 Other long term (current) drug therapy: Secondary | ICD-10-CM

## 2022-10-26 DIAGNOSIS — I152 Hypertension secondary to endocrine disorders: Secondary | ICD-10-CM | POA: Insufficient documentation

## 2022-10-26 MED ORDER — LOSARTAN POTASSIUM 25 MG PO TABS: 25.0000 mg | ORAL_TABLET | Freq: Every day | ORAL | 3 refills | Status: DC

## 2022-10-26 MED ORDER — NITROGLYCERIN 0.4 MG SL SUBL: 0.4000 mg | SUBLINGUAL_TABLET | SUBLINGUAL | 3 refills | Status: AC | PRN

## 2022-10-26 NOTE — Progress Notes (Signed)
Cardiology Office Note:  .   Date:  10/26/2022  ID:  Lisa [name], DOB 28-Jul-1950, MRN [id number] PCP: Dana Allan, MD  [facility] Providers Cardiologist:  Yvonne Kendall, MD     History of Present Illness: .   Lisa [name] is a 72 y.o. female with history of coronary artery disease status post remote coronary intervention at [facility] (the late 90s) and cardiac arrest in the setting of anaphylaxis (10/2016) with catheterization demonstrating severe 2-vessel CAD (subsequent atherectomy and PCI to RCA), recurrent pericardial effusion, type 1 diabetes mellitus, peripheral vascular disease with AAA, right lower lobe pneumonia with mucous plugging, and spontaneous right thigh hematoma in the setting of anticoagulation with heparin, who presents for follow-up of CAD and AAA.  I last saw her in March, which time she continued to feel well.  Her blood pressure was well-controlled.  We did not make any medication changes or pursue additional testing.  Today, Lisa [name] reports feeling well with no new or worsening symptoms. She denies experiencing chest pain, dyspnea, palpitations, dizziness, lightheadedness, or lower extremity edema. She continues to exercise regularly without difficulty and reports her blood sugar levels have been "pretty good."  Lisa [name] remains active, attending the gym a couple of times a week for treadmill walking and light weightlifting. She has upcoming cataract surgery scheduled.   Lisa [name] is currently between primary care providers, with an appointment scheduled with a new physician in November. She has not experienced any hypoglycemic episodes and reports her blood sugar levels are generally stable with careful management. The patient's most recent HbA1c level is unknown, but she reports it was "not bad" at last check.  She occasionally checks her blood pressure at home, with readings typically around 125/75. However, a reading taken during the current visit was slightly  elevated at 138/72. The patient's current medications include aspirin, atorvastatin, and diltiazem. She has been experiencing easy bruising, which she attributes to daily aspirin use.       ROS: See HPI  Studies Reviewed: Marland Kitchen   EKG Interpretation Date/Time:  Wednesday October 26 2022 09:16:18 EDT Ventricular Rate:  73 PR Interval:  150 QRS Duration:  82 QT Interval:  366 QTC Calculation: 403 R Axis:   48  Text Interpretation: Normal sinus rhythm Normal ECG When compared with ECG of 21-Apr-2022 No significant change was found Confirmed by Shamari Lofquist, Cristal Deer [phone]) on 10/26/2022 9:52:50 AM    Cath/PCI: PCI (12/02/16): Successful orbital atherectomy and PCI to heavily calcified mid RCA disease with placement of a Xience Alpine 3.0 x 33 mm drug-eluting stent. LHC (10/11/16): Severe two-vessel coronary artery disease including heavily calcified 95% proximal/mid LCx and sequential 90-99% mid RCA stenoses. Mild to moderate, nonobstructive disease involving the LMCA and LAD. Basal inferior hypokinesis with overall preserved LV contraction. Mildly elevated LVEDP. Peripheral vascular disease with ectasia and calcification of the infrarenal abdominal aorta and iliac arteries.   Non-Invasive Evaluation(s): AAA duplex (07/12/2022): Stable mild dilation of the mid abdominal aorta, measuring 3.2 cm (previously 3.1 cm). AAA duplex (07/13/2021): Slight interval enlargement of proximal and mid abdominal aorta, measuring up to 3.0 cm (previously 2.8 cm). AAA duplex (06/25/2020): Stable mild dilation of proximal and mid abdominal aorta, measuring up to 2.7 cm. Limited TTE (01/19/17): Trivial pericardial effusion, significantly decreased in size from prior study. LVEF 55-60%. Limited TTE (12/13/16): Normal LV size with LVEF of 65-70%.  Normal RV size and function.  Moderate to large circumferential pericardial effusion, stable to slightly decreased in size  compared with 12/09/16.  No evidence of tamponade  physiology. TTE (10/25/16): Normal LV size with mild to moderate LVH. LVEF 60-65% with grade 2 diastolic dysfunction. RV size and function. Normal PA pressure. Small-to-moderate pericardial effusion. TTE (10/12/16): Normal LV size. LVEF 55-60%. Normal diastolic function. Normal RV size and function. Normal PA pressure.  Risk Assessment/Calculations:    CHA2DS2-VASc Score = 5   This indicates a 7.2% annual risk of stroke. The patient's score is based upon: CHF History: 0 HTN History: 1 Diabetes History: 1 Stroke History: 0 Vascular Disease History: 1 Age Score: 1 Gender Score: 1            Physical Exam:   VS:  BP 132/74   Pulse 73   Ht 5' 2.5" (1.588 m)   Wt 129 lb 3.2 oz (58.6 kg)   SpO2 95%   BMI 23.25 kg/m    Wt Readings from Last 3 Encounters:  10/26/22 129 lb 3.2 oz (58.6 kg)  10/07/22 131 lb (59.4 kg)  06/21/22 129 lb (58.5 kg)    General:  NAD. Neck: No JVD or HJR. Lungs: Clear to auscultation bilaterally without wheezes or crackles. Heart: Regular rate and rhythm without murmurs, rubs, or gallops. Abdomen: Soft, nontender, nondistended. Extremities: No lower extremity edema.  ASSESSMENT AND PLAN: .    Coronary artery disease and hyperlipidemia: Lisa [name] continues to do well without angina.  We will continue her current regimen for secondary prevention, including aspirin and atorvastatin.  Lipid panel last month by Dr. Patrecia Pace showed excellent LDL.    Hypertension associated with type 1 diabetes mellitus: Blood pressure was slightly elevated today at 138/72, though she reports home readings around 125/75. We discussed the importance of maintaining blood pressure below 130/80, especially given her diabetes. We will start losartan 25mg  daily and check kidney function and electrolytes in 2 weeks.  Defer ongoing management of her diabetes mellitus to her new PCP whom she is seeing in November given Dr. Gregery Na retirement.  Paroxysmal atrial fibrillation: This  was isolated to her acute MI and has not recurred.  Given spontaneous thigh hematoma in the setting of heparin therapy, we will continue to defer long-term anticoagulation.  AAA: Mild dilation of the abdominal aorta stable on last check in June.  Continue annual surveillance as well as aspirin and statin therapy.    Dispo: Return to clinic in 6 months.  Signed, Yvonne Kendall, MD

## 2022-10-26 NOTE — Patient Instructions (Signed)
Medication Instructions:  Your physician recommends the following medication changes.  START TAKING: Losartan 25 mg by mouth daily  *If you need a refill on your cardiac medications before your next appointment, please call your pharmacy*   Lab Work: Your provider would like for you to return in 2 weeks to have the following labs drawn: (BMP).   Please go to [facility] [address] ([facility] [address], [place] [address] You do not need an appointment.  They are open from 7:30 am-4 pm.  Lunch from 1:00 pm- 2:00 pm You will not need to be fasting.   Testing/Procedures: No test ordered today    Follow-Up: At [facility], you and your health needs are our priority.  As part of our continuing mission to provide you with exceptional heart care, we have created designated Provider Care Teams.  These Care Teams include your primary Cardiologist (physician) and Advanced Practice Providers (APPs -  Physician Assistants and Nurse Practitioners) who all work together to provide you with the care you need, when you need it.  We recommend signing up for the patient portal called "MyChart".  Sign up information is provided on this After Visit Summary.  MyChart is used to connect with patients for Virtual Visits (Telemedicine).  Patients are able to view lab/test results, encounter notes, upcoming appointments, etc.  Non-urgent messages can be sent to your provider as well.   To learn more about what you can do with MyChart, go to [email or web address].    Your next appointment:   6 month(s)  Provider:   You may see Yvonne Kendall, MD or one of the following Advanced Practice Providers on your designated Care Team:   Nicolasa Ducking, NP Eula Listen, PA-C Cadence Fransico Michael, PA-C Charlsie Quest, NP

## 2022-11-09 ENCOUNTER — Encounter: Payer: Self-pay | Admitting: Ophthalmology

## 2022-11-09 NOTE — Anesthesia Preprocedure Evaluation (Addendum)
Anesthesia Evaluation  Patient identified by MRN, date of birth, ID band Patient awake    Reviewed: Allergy & Precautions, H&P , NPO status , Patient's Chart, lab work & pertinent test results  Airway Mallampati: III  TM Distance: <3 FB Neck ROM: Full    Dental no notable dental hx.    Pulmonary neg pulmonary ROS, pneumonia, COPD, former smoker   Pulmonary exam normal breath sounds clear to auscultation       Cardiovascular hypertension, + CAD, + Past MI and + Peripheral Vascular Disease  negative cardio ROS Normal cardiovascular exam Rhythm:Regular Rate:Normal  history of coronary artery disease status post remote coronary intervention at [facility] (the late 90s) and cardiac arrest in the setting of anaphylaxis (10/2016) with catheterization demonstrating severe 2-vessel CAD (subsequent atherectomy and PCI to RCA), recurrent pericardial effusion, type 1 diabetes mellitus, peripheral vascular disease with AAA, right lower lobe pneumonia with mucous plugging, and spontaneous right thigh hematoma in the setting of anticoagulation with heparin  TTE (10/25/16): Normal LV size with mild to moderate LVH. LVEF 60-65% with grade 2 diastolic dysfunction. RV size and function. Normal PA pressure. Small-to-moderate pericardial effusion.  Cardiology Office Note:  .  Date:  10/26/2022  ID:  Lisa [name], DOB 03/13/1950, MRN [id number] PCP: Dana Allan, MD  [facility] Providers Cardiologist:  Yvonne Kendall, MD       History of Present Illness: .  Lisa [name] is a 72 y.o. female with history of coronary artery disease status post remote coronary intervention at [facility] (the late 90s) and cardiac arrest in the setting of anaphylaxis (10/2016) with catheterization demonstrating severe 2-vessel CAD (subsequent atherectomy and PCI to RCA), recurrent pericardial effusion, type 1 diabetes mellitus, peripheral vascular disease with AAA, right lower  lobe pneumonia with mucous plugging, and spontaneous right thigh hematoma in the setting of anticoagulation with heparin, who presents for follow-up of CAD and AAA.  I last saw her in March, which time she continued to feel well.  Her blood pressure was well-controlled.  We did not make any medication changes or pursue additional testing.   Today, Lisa [name] reports feeling well with no new or worsening symptoms. She denies experiencing chest pain, dyspnea, palpitations, dizziness, lightheadedness, or lower extremity edema. She continues to exercise regularly without difficulty and reports her blood sugar levels have been "pretty good."  Lisa [name] remains active, attending the gym a couple of times a week for treadmill walking and light weightlifting. She has upcoming cataract surgery scheduled.    Lisa [name] is currently between primary care providers, with an appointment scheduled with a new physician in November. She has not experienced any hypoglycemic episodes and reports her blood sugar levels are generally stable with careful management. The patient's most recent HbA1c level is unknown, but she reports it was "not bad" at last check.  She occasionally checks her blood pressure at home, with readings typically around 125/75. However, a reading taken during the current visit was slightly elevated at 138/72. The patient's current medications include aspirin, atorvastatin, and diltiazem. She has been experiencing easy bruising, which she attributes to daily aspirin use.        ROS: See HPI    Studies Reviewed: Marland Kitchen  EKG Interpretation Date/Time:                   Wednesday October 26 2022 09:16:18 EDT Ventricular Rate:         73 PR Interval:  150 QRS Duration:                         82 QT Interval:                  366 QTC Calculation:403 R Axis:                                      48   Text Interpretation:Normal sinus rhythm Normal ECG When compared with ECG of 21-Apr-2022 No  significant change was found Confirmed by End, Christopher [phone]) on 10/26/2022 9:52:50 AM     Cath/PCI:  PCI (12/02/16): Successful orbital atherectomy and PCI to heavily calcified mid RCA disease with placement of a Xience Alpine 3.0 x 33 mm drug-eluting stent.  LHC (10/11/16): Severe two-vessel coronary artery disease including heavily calcified 95% proximal/mid LCx and sequential 90-99% mid RCA stenoses. Mild to moderate, nonobstructive disease involving the LMCA and LAD. Basal inferior hypokinesis with overall preserved LV contraction. Mildly elevated LVEDP. Peripheral vascular disease with ectasia and calcification of the infrarenal abdominal aorta and iliac arteries.   Non-Invasive Evaluation(s):  AAA duplex (07/12/2022): Stable mild dilation of the mid abdominal aorta, measuring 3.2 cm (previously 3.1 cm).   01-19-17 Limited study to evaluate pericardial effusion.  - Left ventricle: The cavity size was normal. Systolic function was    normal. The estimated ejection fraction was in the range of 55%    to 60%. Wall motion was normal; there were no regional wall    motion abnormalities.  - Right ventricle: The cavity size was normal. Systolic function    was normal.  - Pericardium, extracardiac: A trivial pericardial effusion was    identified.     Neuro/Psych  PSYCHIATRIC DISORDERS      negative neurological ROS  negative psych ROS   GI/Hepatic negative GI ROS, Neg liver ROS,,,(+) Hepatitis -  Endo/Other  negative endocrine ROSdiabetes    Renal/GU Renal diseasenegative Renal ROS  negative genitourinary   Musculoskeletal negative musculoskeletal ROS (+)    Abdominal   Peds negative pediatric ROS (+)  Hematology negative hematology ROS (+)   Anesthesia Other Findings CAD (coronary artery disease) Ventricular fibrillation (HCC) NSTEMI (non-ST elevated myocardial infarction)  PVD (peripheral vascular disease) Groin hematoma Pericardial effusion Mucus plugging of  bronchi  History of kidney stones High cholesterol  Pneumonia Type I diabetes mellitus   PAF (paroxysmal atrial fibrillation) AAA (abdominal aortic aneurysm) Grade II diastolic dysfunction Has insulin pump and continuous glucose monitor  Reproductive/Obstetrics negative OB ROS                              Anesthesia Physical Anesthesia Plan  ASA: 3  Anesthesia Plan: MAC   Post-op Pain Management:    Induction: Intravenous  PONV Risk Score and Plan:   Airway Management Planned: Natural Airway and Nasal Cannula  Additional Equipment:   Intra-op Plan:   Post-operative Plan:   Informed Consent: I have reviewed the patients History and Physical, chart, labs and discussed the procedure including the risks, benefits and alternatives for the proposed anesthesia with the patient or authorized representative who has indicated his/her understanding and acceptance.     Dental Advisory Given  Plan Discussed with: Anesthesiologist, CRNA and Surgeon  Anesthesia Plan Comments: (Patient consented for risks of anesthesia including but not limited  to:  - adverse reactions to medications - damage to eyes, teeth, lips or other oral mucosa - nerve damage due to positioning  - sore throat or hoarseness - Damage to heart, brain, nerves, lungs, other parts of body or loss of life  Patient voiced understanding and assent.)         Anesthesia Quick Evaluation

## 2022-11-10 LAB — BASIC METABOLIC PANEL
BUN/Creatinine Ratio: 21 (ref 12–28)
BUN: 18 mg/dL (ref 8–27)
CO2: 23 mmol/L (ref 20–29)
Calcium: 9.7 mg/dL (ref 8.7–10.3)
Chloride: 97 mmol/L (ref 96–106)
Creatinine, Ser: 0.84 mg/dL (ref 0.57–1.00)
Glucose: 125 mg/dL — ABNORMAL HIGH (ref 70–99)
Potassium: 4.3 mmol/L (ref 3.5–5.2)
Sodium: 137 mmol/L (ref 134–144)
eGFR: 75 mL/min/{1.73_m2} (ref >59)

## 2022-11-15 ENCOUNTER — Encounter: Payer: Self-pay | Admitting: Ophthalmology

## 2022-11-15 NOTE — Discharge Instructions (Signed)

## 2022-11-16 ENCOUNTER — Encounter
Admission: RE | Disposition: A | Discharge status: Home / Self Care | Payer: Self-pay | Source: Home / Self Care | Attending: Ophthalmology

## 2022-11-16 ENCOUNTER — Ambulatory Visit
Admission: RE | Admit: 2022-11-16 | Discharge: 2022-11-16 | Disposition: A | Discharge status: Home / Self Care | Payer: Medicare HMO | Attending: Ophthalmology | Admitting: Ophthalmology

## 2022-11-16 ENCOUNTER — Other Ambulatory Visit: Payer: Self-pay

## 2022-11-16 ENCOUNTER — Encounter: Payer: Self-pay | Admitting: Ophthalmology

## 2022-11-16 ENCOUNTER — Ambulatory Visit: Payer: Medicare HMO | Admitting: Anesthesiology

## 2022-11-16 DIAGNOSIS — I252 Old myocardial infarction: Secondary | ICD-10-CM | POA: Insufficient documentation

## 2022-11-16 DIAGNOSIS — Z87891 Personal history of nicotine dependence: Secondary | ICD-10-CM | POA: Diagnosis not present

## 2022-11-16 DIAGNOSIS — Z9641 Presence of insulin pump (external) (internal): Secondary | ICD-10-CM | POA: Diagnosis not present

## 2022-11-16 DIAGNOSIS — Z8674 Personal history of sudden cardiac arrest: Secondary | ICD-10-CM | POA: Insufficient documentation

## 2022-11-16 DIAGNOSIS — E1051 Type 1 diabetes mellitus with diabetic peripheral angiopathy without gangrene: Secondary | ICD-10-CM | POA: Diagnosis not present

## 2022-11-16 DIAGNOSIS — I10 Essential (primary) hypertension: Secondary | ICD-10-CM | POA: Insufficient documentation

## 2022-11-16 DIAGNOSIS — Z794 Long term (current) use of insulin: Secondary | ICD-10-CM | POA: Diagnosis not present

## 2022-11-16 DIAGNOSIS — I714 Abdominal aortic aneurysm, without rupture, unspecified: Secondary | ICD-10-CM | POA: Diagnosis not present

## 2022-11-16 DIAGNOSIS — I480 Paroxysmal atrial fibrillation: Secondary | ICD-10-CM | POA: Diagnosis not present

## 2022-11-16 DIAGNOSIS — H2511 Age-related nuclear cataract, right eye: Secondary | ICD-10-CM | POA: Insufficient documentation

## 2022-11-16 DIAGNOSIS — E1036 Type 1 diabetes mellitus with diabetic cataract: Secondary | ICD-10-CM | POA: Diagnosis present

## 2022-11-16 DIAGNOSIS — J449 Chronic obstructive pulmonary disease, unspecified: Secondary | ICD-10-CM | POA: Insufficient documentation

## 2022-11-16 DIAGNOSIS — E7800 Pure hypercholesterolemia, unspecified: Secondary | ICD-10-CM | POA: Insufficient documentation

## 2022-11-16 DIAGNOSIS — Z955 Presence of coronary angioplasty implant and graft: Secondary | ICD-10-CM | POA: Insufficient documentation

## 2022-11-16 DIAGNOSIS — I2510 Atherosclerotic heart disease of native coronary artery without angina pectoris: Secondary | ICD-10-CM | POA: Insufficient documentation

## 2022-11-16 HISTORY — DX: Other ill-defined heart diseases: I51.89

## 2022-11-16 HISTORY — DX: Abdominal aortic aneurysm, without rupture, unspecified: I71.40

## 2022-11-16 HISTORY — PX: CATARACT EXTRACTION W/PHACO: SHX586

## 2022-11-16 LAB — GLUCOSE, CAPILLARY: Glucose-Capillary: 164 mg/dL — ABNORMAL HIGH (ref 70–99)

## 2022-11-16 SURGERY — PHACOEMULSIFICATION, CATARACT, WITH IOL INSERTION
Anesthesia: Monitor Anesthesia Care | Laterality: Right

## 2022-11-16 MED ORDER — SIGHTPATH DOSE#1 NA HYALUR & NA CHOND-NA HYALUR IO KIT
PACK | INTRAOCULAR | Status: DC | PRN
  Administered 2022-11-16: 1 via OPHTHALMIC

## 2022-11-16 MED ORDER — LACTATED RINGERS IV SOLN: INTRAVENOUS | Status: DC

## 2022-11-16 MED ORDER — SIGHTPATH DOSE#1 BSS IO SOLN
INTRAOCULAR | Status: DC | PRN
  Administered 2022-11-16: 89 mL via OPHTHALMIC

## 2022-11-16 MED ORDER — TETRACAINE HCL 0.5 % OP SOLN
OPHTHALMIC | Status: AC
  Filled 2022-11-16: qty 4

## 2022-11-16 MED ORDER — BRIMONIDINE TARTRATE-TIMOLOL 0.2-0.5 % OP SOLN
OPHTHALMIC | Status: DC | PRN
  Administered 2022-11-16: 1 [drp] via OPHTHALMIC

## 2022-11-16 MED ORDER — ARMC OPHTHALMIC DILATING DROPS
OPHTHALMIC | Status: AC
  Filled 2022-11-16: qty 0.5

## 2022-11-16 MED ORDER — MIDAZOLAM HCL 2 MG/2ML IJ SOLN
INTRAMUSCULAR | Status: AC
  Filled 2022-11-16: qty 2

## 2022-11-16 MED ORDER — SIGHTPATH DOSE#1 BSS IO SOLN
INTRAOCULAR | Status: DC | PRN
  Administered 2022-11-16: 2 mL

## 2022-11-16 MED ORDER — ARMC OPHTHALMIC DILATING DROPS
1.0000 | OPHTHALMIC | Status: DC | PRN
  Administered 2022-11-16 (×3): 1 via OPHTHALMIC

## 2022-11-16 MED ORDER — SIGHTPATH DOSE#1 BSS IO SOLN
INTRAOCULAR | Status: DC | PRN
  Administered 2022-11-16: 15 mL via INTRAOCULAR

## 2022-11-16 MED ORDER — SODIUM CHLORIDE 0.9% FLUSH: 10.0000 mL | INTRAVENOUS | Status: DC | PRN

## 2022-11-16 MED ORDER — MOXIFLOXACIN HCL 0.5 % OP SOLN
OPHTHALMIC | Status: DC | PRN
  Administered 2022-11-16: .2 mL via OPHTHALMIC

## 2022-11-16 MED ORDER — MIDAZOLAM HCL 2 MG/2ML IJ SOLN
INTRAMUSCULAR | Status: DC | PRN
  Administered 2022-11-16: 2 mg via INTRAVENOUS

## 2022-11-16 MED ORDER — TETRACAINE HCL 0.5 % OP SOLN
1.0000 [drp] | OPHTHALMIC | Status: DC | PRN
  Administered 2022-11-16 (×3): 1 [drp] via OPHTHALMIC

## 2022-11-16 SURGICAL SUPPLY — 9 items
CATARACT SUITE SIGHTPATH (MISCELLANEOUS) ×1
FEE CATARACT SUITE SIGHTPATH (MISCELLANEOUS) ×1 IMPLANT
GLOVE SRG 8 PF TXTR STRL LF DI (GLOVE) ×1 IMPLANT
GLOVE SURG ENC TEXT LTX SZ7.5 (GLOVE) ×1 IMPLANT
GLOVE SURG UNDER POLY LF SZ8 (GLOVE) ×1
LENS IOL TECNIS EYHANCE 21.5 (Intraocular Lens) IMPLANT
NDL FILTER BLUNT 18X1 1/2 (NEEDLE) ×1 IMPLANT
NEEDLE FILTER BLUNT 18X1 1/2 (NEEDLE) ×1
SYR 3ML LL SCALE MARK (SYRINGE) ×1 IMPLANT

## 2022-11-16 NOTE — Transfer of Care (Signed)
Immediate Anesthesia Transfer of Care Note  Patient: Lisa [name]  Procedure(s) Performed: CATARACT EXTRACTION PHACO AND INTRAOCULAR LENS PLACEMENT (IOC) RIGHT DIABETIC 7.94 00:46.4 (Right)  Patient Location: PACU  Anesthesia Type:MAC  Level of Consciousness: awake, alert , and oriented  Airway & Oxygen Therapy: Patient Spontanous Breathing  Post-op Assessment: Report given to RN and Post -op Vital signs reviewed and stable  Post vital signs: Reviewed and stable  Last Vitals:  Vitals Value Taken Time  BP 134/75 11/16/22 0911  Temp 36.5 C 11/16/22 0911  Pulse 73 11/16/22 0912  Resp 19 11/16/22 0912  SpO2 98 % 11/16/22 0912  Vitals shown include unfiled device data.  Last Pain:  Vitals:   11/16/22 0911  TempSrc:   PainSc: 0-No pain         Complications: No notable events documented.

## 2022-11-16 NOTE — H&P (Signed)
[facility]   Primary Care Physician:  Dana Allan, MD Ophthalmologist: Dr. Lockie Mola  Pre-Procedure History & Physical: HPI:  Lisa [name] is a 72 y.o. female here for ophthalmic surgery.   Past Medical History:  Diagnosis Date   AAA (abdominal aortic aneurysm) (HCC)    CAD (coronary artery disease)    a. 1999 PTCA @ [facility]; b. NSTEMI/Cath 10/11/2016: LM 20%, oLAD 40%, p-mLAD 40%, dLAD 40%, p-mLCx 95%, pRCA 30%, 95/24m, EF 55-65%; c. 10/2016 CT Surgery->rec PCI due to poor lung fxn. d. 12/02/16 DES to mid RCA.   Grade II diastolic dysfunction    Groin hematoma    a. 10/2016 Spont groin hematoma w/ anemia req 1u prbcs.   High cholesterol    History of kidney stones    Mucus plugging of bronchi    a. 10/2016 RLL Bronchus obstruction->bronchus vs mass;  b. 10/2016 CT Chest: resolution of RLL mucous plug.   NSTEMI (non-ST elevated myocardial infarction) (HCC) 10/11/2016   PAF (paroxysmal atrial fibrillation) (HCC)    a. 12/2016 Noted during diag cath--CHA2DS2VASc = 3. No OAC in setting of need for DAPT and h/o spont Thigh Hematoma in 10/2016.   Pericardial effusion    a. 10/2016 Echo: EF 60-65%, no rwma, Gr2 DD, small to mod pericardial eff. No tamponade; b. 12/2016 Echo: EF 60-65%, no rwma, Gr1 DD, 1.2 to 1.4 cm mod to large circumferential pericardial effusion w/o tamponade; c. 12/2016 Echo: EF 55-60%, no rwma, triv effusion.   Pneumonia 02/2016   "couple times before 02/2016 too" (12/02/2016)   PVD (peripheral vascular disease) (HCC)    a. Abdominal aortogram 10/11/16: Infrarenal abdominal aorta heavily calcified & ectatic with approximately 40-50% stenosis at the bifurcation. Mild diffuse disease w/ heavy calcification noted in the common & external iliac arteries bilaterally   Type I diabetes mellitus (HCC)    a. On insulin pump.(12/02/2016)   Ventricular fibrillation (HCC)    a. 10/11/2016: Felt to be secondary to demand ischemia in the setting of underlying CAD and  anaphylactic reaction from IV Rocephin; b. 10/2016 Echo: EF 55-60%, ? mild inf HK; c. 10/2016 Seen by EP: No ICD indication.    Past Surgical History:  Procedure Laterality Date   ABDOMINAL AORTOGRAM N/A 10/11/2016   Procedure: ABDOMINAL AORTOGRAM;  Surgeon: Yvonne Kendall, MD;  Location: [facility] INVASIVE CV LAB;  Service: Cardiovascular;  Laterality: N/A;   CARDIAC CATHETERIZATION  10/2016   CERVICAL BIOPSY  W/ LOOP ELECTRODE EXCISION  03/05/2012   COLONOSCOPY     COLONOSCOPY WITH PROPOFOL N/A 06/27/2018   Procedure: COLONOSCOPY WITH PROPOFOL;  Surgeon: Earline Mayotte, MD;  Location: [facility] ENDOSCOPY;  Service: Endoscopy;  Laterality: N/A;   COLONOSCOPY WITH PROPOFOL N/A 03/24/2022   Procedure: COLONOSCOPY WITH PROPOFOL;  Surgeon: Midge Minium, MD;  Location: [facility] ENDOSCOPY;  Service: Endoscopy;  Laterality: N/A;   COLPOSCOPY  01/17/2011   CORONARY ANGIOPLASTY WITH STENT PLACEMENT  1999; 12/02/2016   "1 stent  + 1 stent"   CORONARY ATHERECTOMY N/A 12/02/2016   Procedure: CORONARY ATHERECTOMY - CSI;  Surgeon: Yvonne Kendall, MD;  Location: [facility] INVASIVE CV LAB;  Service: Cardiovascular;  Laterality: N/A;   CORONARY STENT INTERVENTION N/A 12/02/2016   Procedure: CORONARY STENT INTERVENTION;  Surgeon: Yvonne Kendall, MD;  Location: [facility] INVASIVE CV LAB;  Service: Cardiovascular;  Laterality: N/A;   LEFT HEART CATH AND CORONARY ANGIOGRAPHY N/A 10/11/2016   Procedure: LEFT HEART CATH AND CORONARY ANGIOGRAPHY;  Surgeon: Yvonne Kendall, MD;  Location: [facility] INVASIVE  CV LAB;  Service: Cardiovascular;  Laterality: N/A;   NASAL SINUS SURGERY  ~ 2009   TEMPORARY PACEMAKER N/A 12/02/2016   Procedure: TEMPORARY PACEMAKER;  Surgeon: Yvonne Kendall, MD;  Location: [facility] INVASIVE CV LAB;  Service: Cardiovascular;  Laterality: N/A;   TONSILLECTOMY      Prior to Admission medications   Medication Sig Start Date End Date Taking? Authorizing Provider  ALPRAZolam (XANAX) 0.25 MG tablet Take 0.25 mg by mouth 2  (two) times daily as needed (for anxiety.).    Yes [provider]  aspirin EC 81 MG tablet Take 81 mg by mouth daily.   Yes [provider]  atorvastatin (LIPITOR) 40 MG tablet Take 40 mg by mouth at bedtime.    Yes [provider]  Continuous Glucose Receiver (DEXCOM G6 RECEIVER) DEVI by Does not apply route.   Yes [provider]  Continuous Glucose Sensor (DEXCOM G6 SENSOR) MISC by Does not apply route.   Yes [provider]  Continuous Glucose Transmitter (DEXCOM G6 TRANSMITTER) MISC by Does not apply route.   Yes [provider]  diltiazem (CARDIZEM CD) 240 MG 24 hr capsule TAKE 1 CAPSULE(240 MG) BY MOUTH DAILY 09/21/22  Yes End, Cristal Deer, MD  insulin aspart (NOVOLOG) cartridge Inject into the skin 3 (three) times daily with meals.   Yes [provider]  Insulin Disposable Pump (OMNIPOD 5 G6 PODS, GEN 5,) MISC Inject into the skin. 10/06/22  Yes [provider]  ipratropium (ATROVENT) 0.06 % nasal spray Place 2 sprays into both nostrils 3 (three) times daily. 10/04/22  Yes [provider]  losartan (COZAAR) 25 MG tablet Take 1 tablet (25 mg total) by mouth daily. 10/26/22 01/24/23 Yes End, Cristal Deer, MD  Multiple Vitamins-Minerals (PRESERVISION AREDS PO) Take by mouth daily.   Yes [provider]  ONETOUCH VERIO test strip USE TO TEST FOUR TIMES DAILY 04/26/19  Yes [provider]  Vitamin D, Ergocalciferol, (DRISDOL) 50000 units CAPS capsule Take 50,000 Units by mouth every 30 (thirty) days.    Yes [provider]  benzonatate (TESSALON) 100 MG capsule Take 100-200 mg by mouth 3 (three) times daily as needed. Patient not taking: Reported on 11/09/2022    [provider]  nitroGLYCERIN (NITROSTAT) 0.4 MG SL tablet Place 1 tablet (0.4 mg total) under the tongue every 5 (five) minutes x 3 doses as needed for chest pain. 10/26/22   End, Cristal Deer, MD    Allergies as of 10/27/2022 -  Review Complete 10/26/2022  Allergen Reaction Noted   Ceftriaxone  10/11/2016   Penicillins Anaphylaxis 05/07/2017   Rocephin [ceftriaxone sodium in dextrose] Anaphylaxis 10/25/2016   Albuterol Other (See Comments) 10/26/2016    Family History  Problem Relation Age of Onset   Hypertension Mother    Aortic aneurysm Father    Hypertension Brother    Lung disease Neg Hx    Rheumatologic disease Neg Hx     Social History   Socioeconomic History   Marital status: Married    Spouse name: Not on file   Number of children: Not on file   Years of education: Not on file   Highest education level: Not on file  Occupational History   Not on file  Tobacco Use   Smoking status: Former    Current packs/day: 0.00    Average packs/day: 0.2 packs/day for 47.7 years (11.9 ttl pk-yrs)    Types: Cigarettes    Start date: 01/25/1969    Quit date: 10/11/2016  Years since quitting: 6.1   Smokeless tobacco: Never  Vaping Use   Vaping status: Never Used  Substance and Sexual Activity   Alcohol use: No    Comment: 1 glass of wine per night   Drug use: No   Sexual activity: Yes    Birth control/protection: None  Other Topics Concern   Not on file  Social History Narrative   [facility] (10/14/16):   Patient has primarily done office work. Married for approximately 1 year. Has a dog at home but no bird exposure. No mold exposure.   Social Determinants of Health   Financial Resource Strain: Not on file  Food Insecurity: Not on file  Transportation Needs: Not on file  Physical Activity: Insufficiently Active (03/30/2017)   Exercise Vital Sign    Days of Exercise per Week: 3 days    Minutes of Exercise per Session: 40 min  Stress: No Stress Concern Present (03/30/2017)   [employer] of Occupational Health - Occupational Stress Questionnaire    Feeling of Stress : Not at all  Social Connections: Socially Integrated (03/30/2017)   Social Connection and Isolation Panel [NHANES]     Frequency of Communication with Friends and Family: More than three times a week    Frequency of Social Gatherings with Friends and Family: Twice a week    Attends Religious Services: More than 4 times per year    Active Member of [employer] or Organizations: Yes    Attends Engineer, structural: More than 4 times per year    Marital Status: Married  Catering manager Violence: Not At Risk (03/30/2017)   Humiliation, Afraid, Rape, and Kick questionnaire    Fear of Current or Ex-Partner: No    Emotionally Abused: No    Physically Abused: No    Sexually Abused: No    Review of Systems: See HPI, otherwise negative ROS  Physical Exam: Ht 5' 2.5" (1.588 m)   Wt 58.5 kg   BMI 23.22 kg/m  General:   Alert,  pleasant and cooperative in NAD Head:  Normocephalic and atraumatic. Lungs:  Clear to auscultation.    Heart:  Regular rate and rhythm.   Impression/Plan: Dorma Russell is here for ophthalmic surgery.  Risks, benefits, limitations, and alternatives regarding ophthalmic surgery have been reviewed with the patient.  Questions have been answered.  All parties agreeable.   Lockie Mola, MD  11/16/2022, 8:05 AM

## 2022-11-16 NOTE — Anesthesia Postprocedure Evaluation (Signed)
Anesthesia Post Note  Patient: Lisa [name]  Procedure(s) Performed: CATARACT EXTRACTION PHACO AND INTRAOCULAR LENS PLACEMENT (IOC) RIGHT DIABETIC 7.94 00:46.4 (Right)  Patient location during evaluation: PACU Anesthesia Type: MAC Level of consciousness: awake and alert Pain management: pain level controlled Vital Signs Assessment: post-procedure vital signs reviewed and stable Respiratory status: spontaneous breathing, nonlabored ventilation, respiratory function stable and patient connected to nasal cannula oxygen Cardiovascular status: stable and blood pressure returned to baseline Postop Assessment: no apparent nausea or vomiting Anesthetic complications: no   No notable events documented.   Last Vitals:  Vitals:   11/16/22 0911 11/16/22 0915  BP: 134/75 129/80  Pulse: 73   Resp: 12   Temp: 36.5 C 36.4 C  SpO2: 98%     Last Pain:  Vitals:   11/16/22 0915  TempSrc:   PainSc: 0-No pain                 Emrah Ariola C Amiria Orrison

## 2022-11-16 NOTE — Op Note (Signed)
LOCATION:  [facility]   PREOPERATIVE DIAGNOSIS:    Nuclear sclerotic cataract right eye. H25.11   POSTOPERATIVE DIAGNOSIS:  Nuclear sclerotic cataract right eye.     PROCEDURE:  Phacoemusification with posterior chamber intraocular lens placement of the right eye   ULTRASOUND TIME: Procedure(s): CATARACT EXTRACTION PHACO AND INTRAOCULAR LENS PLACEMENT (IOC) RIGHT DIABETIC 7.94 00:46.4 (Right)  LENS:   Implant Name Type Inv. Item Serial No. Manufacturer Lot No. LRB No. Used Action  LENS IOL TECNIS EYHANCE 21.5 - [id number] Intraocular Lens LENS IOL TECNIS EYHANCE 21.5 [id number] [employer]  Right 1 Implanted         SURGEON:  Deirdre Evener, MD   ANESTHESIA:  Topical with tetracaine drops and 2% Xylocaine jelly, augmented with 1% preservative-free intracameral lidocaine.    COMPLICATIONS:  None.   DESCRIPTION OF PROCEDURE:  The patient was identified in the holding room and transported to the operating room and placed in the supine position under the operating microscope.  The right eye was identified as the operative eye and it was prepped and draped in the usual sterile ophthalmic fashion.   A 1 millimeter clear-corneal paracentesis was made at the 12:00 position.  0.5 ml of preservative-free 1% lidocaine was injected into the anterior chamber. The anterior chamber was filled with Viscoat viscoelastic.  A 2.4 millimeter keratome was used to make a near-clear corneal incision at the 9:00 position.  A curvilinear capsulorrhexis was made with a cystotome and capsulorrhexis forceps.  Balanced salt solution was used to hydrodissect and hydrodelineate the nucleus.   Phacoemulsification was then used in stop and chop fashion to remove the lens nucleus and epinucleus.  The remaining cortex was then removed using the irrigation and aspiration handpiece. Provisc was then placed into the capsular bag to distend it for lens placement.  A lens was then injected into the capsular  bag.  The remaining viscoelastic was aspirated.   Wounds were hydrated with balanced salt solution.  The anterior chamber was inflated to a physiologic pressure with balanced salt solution.  No wound leaks were noted. Vigamox 0.2 ml of a 1mg  per ml solution was injected into the anterior chamber for a dose of 0.2 mg of intracameral antibiotic at the completion of the case.   Timolol and Brimonidine drops were applied to the eye.  The patient was taken to the recovery room in stable condition without complications of anesthesia or surgery.   Makenna Macaluso 11/16/2022, 9:10 AM

## 2022-11-17 ENCOUNTER — Encounter: Payer: Self-pay | Admitting: Ophthalmology

## 2022-11-21 ENCOUNTER — Encounter: Payer: Self-pay | Admitting: Ophthalmology

## 2022-11-23 NOTE — Plan of Care (Signed)
[facility] Tonsillectomy/Adenoidectomy, Postoperative PEDS care plan entered in error.

## 2022-11-27 ENCOUNTER — Encounter: Payer: Self-pay | Admitting: Family Medicine

## 2022-11-28 NOTE — Discharge Instructions (Signed)

## 2022-11-28 NOTE — Anesthesia Preprocedure Evaluation (Signed)
Anesthesia Evaluation  Patient identified by MRN, date of birth, ID band Patient awake    Reviewed: Allergy & Precautions, H&P , NPO status , Patient's Chart, lab work & pertinent test results  Airway Mallampati: III  TM Distance: <3 FB Neck ROM: Full    Dental no notable dental hx.    Pulmonary pneumonia, COPD, former smoker   Pulmonary exam normal breath sounds clear to auscultation       Cardiovascular hypertension, + CAD, + Past MI and + Peripheral Vascular Disease  Normal cardiovascular exam Rhythm:Regular Rate:Normal  history of coronary artery disease status post remote coronary intervention at [facility] (the late 90s) and cardiac arrest in the setting of anaphylaxis (10/2016) with catheterization demonstrating severe 2-vessel CAD (subsequent atherectomy and PCI to RCA), recurrent pericardial effusion, type 1 diabetes mellitus, peripheral vascular disease with AAA, right lower lobe pneumonia with mucous plugging, and spontaneous right thigh hematoma in the setting of anticoagulation with heparin   TTE (10/25/16): Normal LV size with mild to moderate LVH. LVEF 60-65% with grade 2 diastolic dysfunction. RV size and function. Normal PA pressure. Small-to-moderate pericardial effusion.   Cardiology Office Note:  .  Date:  10/26/2022  ID:  Lisa [name], DOB 22-Jul-1950, MRN [id number] PCP: Dana Allan, MD  [facility] Providers Cardiologist:  Yvonne Kendall, MD        History of Present Illness: .  Lisa [name] is a 72 y.o. female with history of coronary artery disease status post remote coronary intervention at [facility] (the late 90s) and cardiac arrest in the setting of anaphylaxis (10/2016) with catheterization demonstrating severe 2-vessel CAD (subsequent atherectomy and PCI to RCA), recurrent pericardial effusion, type 1 diabetes mellitus, peripheral vascular disease with AAA, right lower lobe pneumonia with mucous plugging,  and spontaneous right thigh hematoma in the setting of anticoagulation with heparin, who presents for follow-up of CAD and AAA.  I last saw her in March, which time she continued to feel well.  Her blood pressure was well-controlled.  We did not make any medication changes or pursue additional testing.   Today, Ms. Dassow reports feeling well with no new or worsening symptoms. She denies experiencing chest pain, dyspnea, palpitations, dizziness, lightheadedness, or lower extremity edema. She continues to exercise regularly without difficulty and reports her blood sugar levels have been "pretty good."  Ms. Mcphail remains active, attending the gym a couple of times a week for treadmill walking and light weightlifting. She has upcoming cataract surgery scheduled.    Ms. Coronel is currently between primary care providers, with an appointment scheduled with a new physician in November. She has not experienced any hypoglycemic episodes and reports her blood sugar levels are generally stable with careful management. The patient's most recent HbA1c level is unknown, but she reports it was "not bad" at last check.  She occasionally checks her blood pressure at home, with readings typically around 125/75. However, a reading taken during the current visit was slightly elevated at 138/72. The patient's current medications include aspirin, atorvastatin, and diltiazem. She has been experiencing easy bruising, which she attributes to daily aspirin use.        ROS: See HPI     Studies Reviewed: Marland Kitchen  EKG Interpretation Date/Time:                   Wednesday October 26 2022 09:16:18 EDT Ventricular Rate:         73 PR Interval:  150 QRS Duration:                         82 QT Interval:                  366 QTC Calculation:403 R Axis:                                      48   Text Interpretation:Normal sinus rhythm Normal ECG When compared with ECG of 21-Apr-2022 No significant change was found  Confirmed by End, Christopher [phone]) on 10/26/2022 9:52:50 AM     Cath/PCI: PCI (12/02/16): Successful orbital atherectomy and PCI to heavily calcified mid RCA disease with placement of a Xience Alpine 3.0 x 33 mm drug-eluting stent. LHC (10/11/16): Severe two-vessel coronary artery disease including heavily calcified 95% proximal/mid LCx and sequential 90-99% mid RCA stenoses. Mild to moderate, nonobstructive disease involving the LMCA and LAD. Basal inferior hypokinesis with overall preserved LV contraction. Mildly elevated LVEDP. Peripheral vascular disease with ectasia and calcification of the infrarenal abdominal aorta and iliac arteries.   Non-Invasive Evaluation(s): AAA duplex (07/12/2022): Stable mild dilation of the mid abdominal aorta, measuring 3.2 cm (previously 3.1 cm).             01-19-17 Limited study to evaluate pericardial effusion.  - Left ventricle: The cavity size was normal. Systolic function was    normal. The estimated ejection fraction was in the range of 55%    to 60%. Wall motion was normal; there were no regional wall    motion abnormalities.  - Right ventricle: The cavity size was normal. Systolic function    was normal.  - Pericardium, extracardiac: A trivial pericardial effusion was    identified.         Neuro/Psych  PSYCHIATRIC DISORDERS      negative neurological ROS     GI/Hepatic negative GI ROS,,,(+) Hepatitis -  Endo/Other  diabetes    Renal/GU Renal disease  negative genitourinary   Musculoskeletal negative musculoskeletal ROS (+)    Abdominal   Peds negative pediatric ROS (+)  Hematology negative hematology ROS (+)   Anesthesia Other Findings CAD (coronary artery disease) Ventricular fibrillation (HCC) NSTEMI (non-ST elevated myocardial infarction)  PVD (peripheral vascular disease) Groin hematomaPericardial effusion Mucus plugging of bronchiHistory of kidney stones High cholesterol Previous cataract surgery  11-16-22  Pneumonia Type I diabetes mellitus   PAF (paroxysmal atrial fibrillation)  AAA 3.2 cm in 2024 (abdominal aortic aneurysm)  Grade II diastolic dysfunction  Previous cataract surgery 11-16-22  Reproductive/Obstetrics negative OB ROS                              Anesthesia Physical Anesthesia Plan  ASA: 3  Anesthesia Plan: MAC   Post-op Pain Management:    Induction: Intravenous  PONV Risk Score and Plan:   Airway Management Planned: Natural Airway and Nasal Cannula  Additional Equipment:   Intra-op Plan:   Post-operative Plan:   Informed Consent: I have reviewed the patients History and Physical, chart, labs and discussed the procedure including the risks, benefits and alternatives for the proposed anesthesia with the patient or authorized representative who has indicated his/her understanding and acceptance.     Dental Advisory Given  Plan Discussed with: Anesthesiologist, CRNA and Surgeon  Anesthesia Plan Comments: (Patient consented for  risks of anesthesia including but not limited to:  - adverse reactions to medications - damage to eyes, teeth, lips or other oral mucosa - nerve damage due to positioning  - sore throat or hoarseness - Damage to heart, brain, nerves, lungs, other parts of body or loss of life  Patient voiced understanding and assent.)        Anesthesia Quick Evaluation

## 2022-11-30 ENCOUNTER — Encounter
Admission: RE | Disposition: A | Discharge status: Home / Self Care | Payer: Self-pay | Source: Home / Self Care | Attending: Ophthalmology

## 2022-11-30 ENCOUNTER — Other Ambulatory Visit: Payer: Self-pay

## 2022-11-30 ENCOUNTER — Ambulatory Visit
Admission: RE | Admit: 2022-11-30 | Discharge: 2022-11-30 | Disposition: A | Discharge status: Home / Self Care | Payer: Medicare HMO | Attending: Ophthalmology | Admitting: Ophthalmology

## 2022-11-30 ENCOUNTER — Encounter: Payer: Self-pay | Admitting: Ophthalmology

## 2022-11-30 ENCOUNTER — Ambulatory Visit: Payer: Medicare HMO | Admitting: Anesthesiology

## 2022-11-30 DIAGNOSIS — Z955 Presence of coronary angioplasty implant and graft: Secondary | ICD-10-CM | POA: Insufficient documentation

## 2022-11-30 DIAGNOSIS — Z9641 Presence of insulin pump (external) (internal): Secondary | ICD-10-CM | POA: Diagnosis not present

## 2022-11-30 DIAGNOSIS — I252 Old myocardial infarction: Secondary | ICD-10-CM | POA: Diagnosis not present

## 2022-11-30 DIAGNOSIS — I10 Essential (primary) hypertension: Secondary | ICD-10-CM | POA: Diagnosis not present

## 2022-11-30 DIAGNOSIS — H2512 Age-related nuclear cataract, left eye: Secondary | ICD-10-CM | POA: Diagnosis present

## 2022-11-30 DIAGNOSIS — J449 Chronic obstructive pulmonary disease, unspecified: Secondary | ICD-10-CM | POA: Insufficient documentation

## 2022-11-30 DIAGNOSIS — Z794 Long term (current) use of insulin: Secondary | ICD-10-CM | POA: Diagnosis not present

## 2022-11-30 DIAGNOSIS — I2510 Atherosclerotic heart disease of native coronary artery without angina pectoris: Secondary | ICD-10-CM | POA: Insufficient documentation

## 2022-11-30 DIAGNOSIS — E1051 Type 1 diabetes mellitus with diabetic peripheral angiopathy without gangrene: Secondary | ICD-10-CM | POA: Insufficient documentation

## 2022-11-30 HISTORY — PX: CATARACT EXTRACTION W/PHACO: SHX586

## 2022-11-30 LAB — GLUCOSE, CAPILLARY: Glucose-Capillary: 162 mg/dL — ABNORMAL HIGH (ref 70–99)

## 2022-11-30 SURGERY — PHACOEMULSIFICATION, CATARACT, WITH IOL INSERTION
Anesthesia: Monitor Anesthesia Care | Site: Eye | Laterality: Left

## 2022-11-30 MED ORDER — FENTANYL CITRATE (PF) 100 MCG/2ML IJ SOLN
INTRAMUSCULAR | Status: AC
  Filled 2022-11-30: qty 2

## 2022-11-30 MED ORDER — SIGHTPATH DOSE#1 BSS IO SOLN
INTRAOCULAR | Status: DC | PRN
  Administered 2022-11-30: 15 mL

## 2022-11-30 MED ORDER — SIGHTPATH DOSE#1 BSS IO SOLN
INTRAOCULAR | Status: DC | PRN
  Administered 2022-11-30: 63 mL via OPHTHALMIC

## 2022-11-30 MED ORDER — SODIUM CHLORIDE 0.9% FLUSH
INTRAVENOUS | Status: DC | PRN
  Administered 2022-11-30: 10 mL via INTRAVENOUS

## 2022-11-30 MED ORDER — ARMC OPHTHALMIC DILATING DROPS
OPHTHALMIC | Status: AC
Start: 2022-11-30 — End: ?
  Filled 2022-11-30: qty 0.5

## 2022-11-30 MED ORDER — ARMC OPHTHALMIC DILATING DROPS
1.0000 | OPHTHALMIC | Status: DC | PRN
  Administered 2022-11-30 (×3): 1 via OPHTHALMIC

## 2022-11-30 MED ORDER — SIGHTPATH DOSE#1 NA HYALUR & NA CHOND-NA HYALUR IO KIT
PACK | INTRAOCULAR | Status: DC | PRN
  Administered 2022-11-30: 1 via OPHTHALMIC

## 2022-11-30 MED ORDER — SIGHTPATH DOSE#1 BSS IO SOLN
INTRAOCULAR | Status: DC | PRN
  Administered 2022-11-30: 1 mL

## 2022-11-30 MED ORDER — BRIMONIDINE TARTRATE-TIMOLOL 0.2-0.5 % OP SOLN
OPHTHALMIC | Status: DC | PRN
  Administered 2022-11-30: 1 [drp] via OPHTHALMIC

## 2022-11-30 MED ORDER — MOXIFLOXACIN HCL 0.5 % OP SOLN
OPHTHALMIC | Status: DC | PRN
  Administered 2022-11-30: .2 mL via OPHTHALMIC

## 2022-11-30 MED ORDER — MIDAZOLAM HCL 2 MG/2ML IJ SOLN
INTRAMUSCULAR | Status: DC | PRN
  Administered 2022-11-30: 2 mg via INTRAVENOUS

## 2022-11-30 MED ORDER — MIDAZOLAM HCL 2 MG/2ML IJ SOLN
INTRAMUSCULAR | Status: AC
  Filled 2022-11-30: qty 2

## 2022-11-30 MED ORDER — TETRACAINE HCL 0.5 % OP SOLN
OPHTHALMIC | Status: AC
  Filled 2022-11-30: qty 4

## 2022-11-30 MED ORDER — FENTANYL CITRATE (PF) 100 MCG/2ML IJ SOLN
INTRAMUSCULAR | Status: DC | PRN
  Administered 2022-11-30: 25 ug via INTRAVENOUS

## 2022-11-30 MED ORDER — TETRACAINE HCL 0.5 % OP SOLN
1.0000 [drp] | OPHTHALMIC | Status: DC | PRN
  Administered 2022-11-30 (×3): 1 [drp] via OPHTHALMIC

## 2022-11-30 SURGICAL SUPPLY — 18 items
CANNULA ANT/CHMB 27G (MISCELLANEOUS) IMPLANT
CANNULA ANT/CHMB 27GA (MISCELLANEOUS)
CATARACT SUITE SIGHTPATH (MISCELLANEOUS) ×1
FEE CATARACT SUITE SIGHTPATH (MISCELLANEOUS) ×1 IMPLANT
GLOVE SRG 8 PF TXTR STRL LF DI (GLOVE) ×1 IMPLANT
GLOVE SURG ENC TEXT LTX SZ7.5 (GLOVE) ×1 IMPLANT
GLOVE SURG GAMMEX PI TX LF 7.5 (GLOVE) IMPLANT
GLOVE SURG UNDER POLY LF SZ8 (GLOVE) ×1
LENS IOL TECNIS EYHANCE 21.5 (Intraocular Lens) IMPLANT
NDL FILTER BLUNT 18X1 1/2 (NEEDLE) ×1 IMPLANT
NDL RETROBULBAR .5 NSTRL (NEEDLE) IMPLANT
NEEDLE FILTER BLUNT 18X1 1/2 (NEEDLE) ×1
PACK VIT ANT 23G (MISCELLANEOUS) IMPLANT
RING MALYGIN 7.0 (MISCELLANEOUS) IMPLANT
SUT ETHILON 10-0 CS-B-6CS-B-6 (SUTURE)
SUT VICRYL 9 0 (SUTURE) IMPLANT
SUTURE EHLN 10-0 CS-B-6CS-B-6 (SUTURE) IMPLANT
SYR 3ML LL SCALE MARK (SYRINGE) ×1 IMPLANT

## 2022-11-30 NOTE — Transfer of Care (Signed)
Immediate Anesthesia Transfer of Care Note  Patient: Lisa [name]  Procedure(s) Performed: CATARACT EXTRACTION PHACO AND INTRAOCULAR LENS PLACEMENT (IOC) LEFT DIABETIC (Left: Eye)  Patient Location: PACU  Anesthesia Type:MAC  Level of Consciousness: awake, alert , and oriented  Airway & Oxygen Therapy: Patient Spontanous Breathing  Post-op Assessment: Report given to RN and Post -op Vital signs reviewed and stable  Post vital signs: Reviewed and stable  Last Vitals:  Vitals Value Taken Time  BP 129/73 11/30/22 1023  Temp 36.3 C 11/30/22 1023  Pulse 71 11/30/22 1023  Resp 17 11/30/22 1024  SpO2 100 % 11/30/22 1023  Vitals shown include unfiled device data.  Last Pain:  Vitals:   11/30/22 1023  TempSrc:   PainSc: 0-No pain         Complications: No notable events documented.

## 2022-11-30 NOTE — Anesthesia Postprocedure Evaluation (Signed)
Anesthesia Post Note  Patient: IDAH QUIZON  Procedure(s) Performed: CATARACT EXTRACTION PHACO AND INTRAOCULAR LENS PLACEMENT (IOC) LEFT DIABETIC (Left: Eye)  Patient location during evaluation: PACU Anesthesia Type: MAC Level of consciousness: awake and alert Pain management: pain level controlled Vital Signs Assessment: post-procedure vital signs reviewed and stable Respiratory status: spontaneous breathing, nonlabored ventilation, respiratory function stable and patient connected to nasal cannula oxygen Cardiovascular status: stable and blood pressure returned to baseline Postop Assessment: no apparent nausea or vomiting Anesthetic complications: no   No notable events documented.   Last Vitals:  Vitals:   11/30/22 1023 11/30/22 1028  BP: 129/73 125/74  Pulse: 71   Resp: (!) 24 15  Temp: (!) 36.3 C (!) 36.3 C  SpO2: 100% 100%    Last Pain:  Vitals:   11/30/22 1028  TempSrc:   PainSc: 0-No pain                 Nicollette Wilhelmi C Dameka Younker

## 2022-11-30 NOTE — Plan of Care (Signed)
 [facility] Tonsillectomy/Adenoidectomy, Postoperative PEDS care plan entered in error.

## 2022-11-30 NOTE — Op Note (Signed)
OPERATIVE NOTE  VERENE ZOLLARS [id number] 11/30/2022   PREOPERATIVE DIAGNOSIS:  Nuclear sclerotic cataract left eye. H25.12   POSTOPERATIVE DIAGNOSIS:    Nuclear sclerotic cataract left eye.     PROCEDURE:  Phacoemusification with posterior chamber intraocular lens placement of the left eye  Ultrasound time: Procedure(s) with comments: CATARACT EXTRACTION PHACO AND INTRAOCULAR LENS PLACEMENT (IOC) LEFT DIABETIC (Left) - 7.40 0:41.1  LENS:   Implant Name Type Inv. Item Serial No. Manufacturer Lot No. LRB No. Used Action  LENS IOL TECNIS EYHANCE 21.5 - [id number] Intraocular Lens LENS IOL TECNIS EYHANCE 21.5 [id number] [employer]  Left 1 Implanted      SURGEON:  Deirdre Evener, MD   ANESTHESIA:  Topical with tetracaine drops and 2% Xylocaine jelly, augmented with 1% preservative-free intracameral lidocaine.    COMPLICATIONS:  None.   DESCRIPTION OF PROCEDURE:  The patient was identified in the holding room and transported to the operating room and placed in the supine position under the operating microscope.  The left eye was identified as the operative eye and it was prepped and draped in the usual sterile ophthalmic fashion.   A 1 millimeter clear-corneal paracentesis was made at the 1:30 position.  0.5 ml of preservative-free 1% lidocaine was injected into the anterior chamber.  The anterior chamber was filled with Viscoat viscoelastic.  A 2.4 millimeter keratome was used to make a near-clear corneal incision at the 10:30 position.  .  A curvilinear capsulorrhexis was made with a cystotome and capsulorrhexis forceps.  Balanced salt solution was used to hydrodissect and hydrodelineate the nucleus.   Phacoemulsification was then used in stop and chop fashion to remove the lens nucleus and epinucleus.  The remaining cortex was then removed using the irrigation and aspiration handpiece. Provisc was then placed into the capsular bag to distend it for lens placement.  A lens was  then injected into the capsular bag.  The remaining viscoelastic was aspirated.   Wounds were hydrated with balanced salt solution.  The anterior chamber was inflated to a physiologic pressure with balanced salt solution.  No wound leaks were noted. Vigamox 0.2 ml of a 1mg  per ml solution was injected into the anterior chamber for a dose of 0.2 mg of intracameral antibiotic at the completion of the case.   Timolol and Brimonidine drops were applied to the eye.  The patient was taken to the recovery room in stable condition without complications of anesthesia or surgery.  Justice Milliron 11/30/2022, 10:22 AM

## 2022-11-30 NOTE — H&P (Signed)
[facility]   Primary Care Physician:  Dana Allan, MD Ophthalmologist: Dr. Lockie Mola  Pre-Procedure History & Physical: HPI:  Lisa [name] is a 72 y.o. female here for ophthalmic surgery.   Past Medical History:  Diagnosis Date   AAA (abdominal aortic aneurysm) (HCC)    CAD (coronary artery disease)    a. 1999 PTCA @ [facility]; b. NSTEMI/Cath 10/11/2016: LM 20%, oLAD 40%, p-mLAD 40%, dLAD 40%, p-mLCx 95%, pRCA 30%, 95/1m, EF 55-65%; c. 10/2016 CT Surgery->rec PCI due to poor lung fxn. d. 12/02/16 DES to mid RCA.   Grade II diastolic dysfunction    Groin hematoma    a. 10/2016 Spont groin hematoma w/ anemia req 1u prbcs.   High cholesterol    History of kidney stones    Mucus plugging of bronchi    a. 10/2016 RLL Bronchus obstruction->bronchus vs mass;  b. 10/2016 CT Chest: resolution of RLL mucous plug.   NSTEMI (non-ST elevated myocardial infarction) (HCC) 10/11/2016   PAF (paroxysmal atrial fibrillation) (HCC)    a. 12/2016 Noted during diag cath--CHA2DS2VASc = 3. No OAC in setting of need for DAPT and h/o spont Thigh Hematoma in 10/2016.   Pericardial effusion    a. 10/2016 Echo: EF 60-65%, no rwma, Gr2 DD, small to mod pericardial eff. No tamponade; b. 12/2016 Echo: EF 60-65%, no rwma, Gr1 DD, 1.2 to 1.4 cm mod to large circumferential pericardial effusion w/o tamponade; c. 12/2016 Echo: EF 55-60%, no rwma, triv effusion.   Pneumonia 02/2016   "couple times before 02/2016 too" (12/02/2016)   PVD (peripheral vascular disease) (HCC)    a. Abdominal aortogram 10/11/16: Infrarenal abdominal aorta heavily calcified & ectatic with approximately 40-50% stenosis at the bifurcation. Mild diffuse disease w/ heavy calcification noted in the common & external iliac arteries bilaterally   Type I diabetes mellitus (HCC)    a. On insulin pump.(12/02/2016)   Ventricular fibrillation (HCC)    a. 10/11/2016: Felt to be secondary to demand ischemia in the setting of underlying CAD and  anaphylactic reaction from IV Rocephin; b. 10/2016 Echo: EF 55-60%, ? mild inf HK; c. 10/2016 Seen by EP: No ICD indication.    Past Surgical History:  Procedure Laterality Date   ABDOMINAL AORTOGRAM N/A 10/11/2016   Procedure: ABDOMINAL AORTOGRAM;  Surgeon: Yvonne Kendall, MD;  Location: [facility] INVASIVE CV LAB;  Service: Cardiovascular;  Laterality: N/A;   CARDIAC CATHETERIZATION  10/2016   CATARACT EXTRACTION W/PHACO Right 11/16/2022   Procedure: CATARACT EXTRACTION PHACO AND INTRAOCULAR LENS PLACEMENT (IOC) RIGHT DIABETIC 7.94 00:46.4;  Surgeon: Lockie Mola, MD;  Location: [facility];  Service: Ophthalmology;  Laterality: Right;   CERVICAL BIOPSY  W/ LOOP ELECTRODE EXCISION  03/05/2012   COLONOSCOPY     COLONOSCOPY WITH PROPOFOL N/A 06/27/2018   Procedure: COLONOSCOPY WITH PROPOFOL;  Surgeon: Earline Mayotte, MD;  Location: [facility] ENDOSCOPY;  Service: Endoscopy;  Laterality: N/A;   COLONOSCOPY WITH PROPOFOL N/A 03/24/2022   Procedure: COLONOSCOPY WITH PROPOFOL;  Surgeon: Midge Minium, MD;  Location: [facility] ENDOSCOPY;  Service: Endoscopy;  Laterality: N/A;   COLPOSCOPY  01/17/2011   CORONARY ANGIOPLASTY WITH STENT PLACEMENT  1999; 12/02/2016   "1 stent  + 1 stent"   CORONARY ATHERECTOMY N/A 12/02/2016   Procedure: CORONARY ATHERECTOMY - CSI;  Surgeon: Yvonne Kendall, MD;  [facility]: [facility];  Service: Cardiovascular;  Laterality: N/A;   CORONARY STENT INTERVENTION N/A 12/02/2016   Procedure: CORONARY STENT INTERVENTION;  Surgeon: Yvonne Kendall, MD;  Location:  [facility];  Service: Cardiovascular;  Laterality: N/A;   LEFT HEART CATH AND CORONARY ANGIOGRAPHY N/A 10/11/2016   Procedure: LEFT HEART CATH AND CORONARY ANGIOGRAPHY;  Surgeon: Yvonne Kendall, MD;  Location: [facility] INVASIVE CV LAB;  Service: Cardiovascular;  Laterality: N/A;   NASAL SINUS SURGERY  ~ 2009   TEMPORARY PACEMAKER N/A 12/02/2016   Procedure: TEMPORARY PACEMAKER;  Surgeon: Yvonne Kendall,  MD;  Location: [facility] CV [facility];  Service: Cardiovascular;  Laterality: N/A;   TONSILLECTOMY      Prior to Admission medications   Medication Sig Start Date End Date Taking? Authorizing Provider  ALPRAZolam (XANAX) 0.25 MG tablet Take 0.25 mg by mouth 2 (two) times daily as needed (for anxiety.).    Yes [provider]  aspirin EC 81 MG tablet Take 81 mg by mouth daily.   Yes [provider]  atorvastatin (LIPITOR) 40 MG tablet Take 40 mg by mouth at bedtime.    Yes [provider]  diltiazem (CARDIZEM CD) 240 MG 24 hr capsule TAKE 1 CAPSULE(240 MG) BY MOUTH DAILY 09/21/22  Yes End, Cristal Deer, MD  ipratropium (ATROVENT) 0.06 % nasal spray Place 2 sprays into both nostrils 3 (three) times daily. 10/04/22  Yes [provider]  losartan (COZAAR) 25 MG tablet Take 1 tablet (25 mg total) by mouth daily. 10/26/22 01/24/23 Yes End, Cristal Deer, MD  Multiple Vitamins-Minerals (PRESERVISION AREDS PO) Take by mouth daily.   Yes [provider]  Vitamin D, Ergocalciferol, (DRISDOL) 50000 units CAPS capsule Take 50,000 Units by mouth every 30 (thirty) days.    Yes [provider]  benzonatate (TESSALON) 100 MG capsule Take 100-200 mg by mouth 3 (three) times daily as needed. Patient not taking: Reported on 11/09/2022    [provider]  Continuous Glucose Receiver (DEXCOM G6 RECEIVER) DEVI by Does not apply route.    [provider]  Continuous Glucose Sensor (DEXCOM G6 SENSOR) MISC by Does not apply route.    [provider]  Continuous Glucose Transmitter (DEXCOM G6 TRANSMITTER) MISC by Does not apply route.    [provider]  insulin aspart (NOVOLOG) cartridge Inject into the skin 3 (three) times daily with meals.    [provider]  Insulin Disposable Pump (OMNIPOD 5 G6 PODS, GEN 5,) MISC Inject into the skin. 10/06/22   [provider]  nitroGLYCERIN (NITROSTAT) 0.4 MG SL tablet Place 1 tablet (0.4  mg total) under the tongue every 5 (five) minutes x 3 doses as needed for chest pain. 10/26/22   End, Cristal Deer, MD  [facility] VERIO test strip USE TO TEST FOUR TIMES DAILY 04/26/19   [provider]    Allergies as of 10/27/2022 - Review Complete 10/26/2022  Allergen Reaction Noted   Ceftriaxone  10/11/2016   Penicillins Anaphylaxis 05/07/2017   Rocephin [ceftriaxone sodium in dextrose] Anaphylaxis 10/25/2016   Albuterol Other (See Comments) 10/26/2016    Family History  Problem Relation Age of Onset   Hypertension Mother    Aortic aneurysm Father    Hypertension Brother    Lung disease Neg Hx    Rheumatologic disease Neg Hx     Social History   Socioeconomic History   Marital status: Married    Spouse name: Not on file   Number of children: Not on file   Years of education: Not on file   Highest education level: Not on file  Occupational History   Not on file  Tobacco Use   Smoking status:  Former    Current packs/day: 0.00    Average packs/day: 0.2 packs/day for 47.7 years (11.9 ttl pk-yrs)    Types: Cigarettes    Start date: 01/25/1969    Quit date: 10/11/2016    Years since quitting: 6.1   Smokeless tobacco: Never  Vaping Use   Vaping status: Never Used  Substance and Sexual Activity   Alcohol use: No    Comment: 1 glass of wine per night   Drug use: No   Sexual activity: Yes    Birth control/protection: None  Other Topics Concern   Not on file  Social History Narrative   [facility] (10/14/16):   Patient has primarily done office work. Married for approximately 1 year. Has a dog at home but no bird exposure. No mold exposure.   Social Determinants of Health   Financial Resource Strain: Not on file  Food Insecurity: Not on file  Transportation Needs: Not on file  Physical Activity: Insufficiently Active (03/30/2017)   Exercise Vital Sign    Days of Exercise per Week: 3 days    Minutes of Exercise per Session: 40 min  Stress: No Stress  Concern Present (03/30/2017)   [employer] of Occupational Health - Occupational Stress Questionnaire    Feeling of Stress : Not at all  Social Connections: Socially Integrated (03/30/2017)   Social Connection and Isolation Panel [NHANES]    Frequency of Communication with Friends and Family: More than three times a week    Frequency of Social Gatherings with Friends and Family: Twice a week    Attends Religious Services: More than 4 times per year    Active Member of [employer] or Organizations: Yes    Attends Engineer, structural: More than 4 times per year    Marital Status: Married  Catering manager Violence: Not At Risk (03/30/2017)   Humiliation, Afraid, Rape, and Kick questionnaire    Fear of Current or Ex-Partner: No    Emotionally Abused: No    Physically Abused: No    Sexually Abused: No    Review of Systems: See HPI, otherwise negative ROS  Physical Exam: BP 112/69   Pulse 74   Temp (!) 97.3 F (36.3 C) (Temporal)   Resp 16   Ht 5' 2.52" (1.588 m)   Wt 59.3 kg   SpO2 99%   BMI 23.53 kg/m  General:   Alert,  pleasant and cooperative in NAD Head:  Normocephalic and atraumatic. Lungs:  Clear to auscultation.    Heart:  Regular rate and rhythm.   Impression/Plan: Lisa [name] is here for ophthalmic surgery.  Risks, benefits, limitations, and alternatives regarding ophthalmic surgery have been reviewed with the patient.  Questions have been answered.  All parties agreeable.   Lockie Mola, MD  11/30/2022, 9:19 AM

## 2022-12-01 ENCOUNTER — Encounter: Payer: Self-pay | Admitting: Ophthalmology

## 2022-12-07 LAB — HM DIABETES FOOT EXAM: HM Diabetic Foot Exam: NORMAL

## 2022-12-23 ENCOUNTER — Other Ambulatory Visit: Payer: Self-pay | Admitting: Internal Medicine

## 2022-12-23 NOTE — Telephone Encounter (Signed)
last visit: 10/26/22 with plan to f/u in 6 months.  next visit: 04/20/23

## 2023-01-26 ENCOUNTER — Other Ambulatory Visit: Payer: Self-pay | Admitting: Family Medicine

## 2023-01-26 ENCOUNTER — Telehealth: Payer: Self-pay

## 2023-01-26 MED ORDER — ATORVASTATIN CALCIUM 40 MG PO TABS: 40.0000 mg | ORAL_TABLET | Freq: Every day | ORAL | 3 refills | Status: DC

## 2023-01-26 NOTE — Telephone Encounter (Signed)
Copied from CRM [id number]. Topic: Clinical - Prescription Issue >> Jan 26, 2023 10:30 AM Corin V wrote: Reason for CRM: Patient called in to request refill on her lipitor that was previously prescribed by her former PCP that retired. She took the last pill today. Epic resolved the CRM instead of routing to clinic. Please reference CRM# [id number] for additional information.

## 2023-01-26 NOTE — Telephone Encounter (Signed)
RX request sent to provider.

## 2023-01-26 NOTE — Telephone Encounter (Signed)
Copied from CRM [id number]. Topic: Clinical - Medication Refill >> Jan 26, 2023 10:28 AM Corin V wrote: Most Recent Primary Care Visit:  Provider: Dana Allan  Department: [facility]  Visit Type: NEW PATIENT  Date: 10/07/2022  Medication: atorvastatin (LIPITOR) 40 MG tablet  Has the patient contacted their pharmacy? Yes, no refills at pharmacy (Agent: If no, request that the patient contact the pharmacy for the refill. If patient does not wish to contact the pharmacy document the reason why and proceed with request.) (Agent: If yes, when and what did the pharmacy advise?)  Is this the correct pharmacy for this prescription? Yes If no, delete pharmacy and type the correct one.  This is the patient's preferred pharmacy:  [id number] - [place], [place] - [address] AT [facility] [address] [facility] [address] [place] [address] Phone: [phone] Fax: [phone]   Has the prescription been filled recently? No  Is the patient out of the medication? Yes  Has the patient been seen for an appointment in the last year OR does the patient have an upcoming appointment? Yes  Can we respond through MyChart? No  Agent: Please be advised that Rx refills may take up to 3 business days. We ask that you follow-up with your pharmacy.

## 2023-01-26 NOTE — Telephone Encounter (Signed)
Copied from CRM [id number]. Topic: Clinical - Medication Refill >> Jan 26, 2023 10:28 AM Corin V wrote: Most Recent Primary Care Visit:  Provider: Dana Allan  Department: [facility]  Visit Type: NEW PATIENT  Date: 10/07/2022  Medication: ***  Has the patient contacted their pharmacy?  (Agent: If no, request that the patient contact the pharmacy for the refill. If patient does not wish to contact the pharmacy document the reason why and proceed with request.) (Agent: If yes, when and what did the pharmacy advise?)  Is this the correct pharmacy for this prescription?  If no, delete pharmacy and type the correct one.  This is the patient's preferred pharmacy:  [id number] - [place], [place] - [address] AT [facility] [address] [facility] [address] [place] [address] Phone: [phone] Fax: [phone]   Has the prescription been filled recently?   Is the patient out of the medication?   Has the patient been seen for an appointment in the last year OR does the patient have an upcoming appointment?   Can we respond through MyChart?   Agent: Please be advised that Rx refills may take up to 3 business days. We ask that you follow-up with your pharmacy.

## 2023-02-03 ENCOUNTER — Telehealth: Payer: Self-pay | Admitting: Family Medicine

## 2023-02-03 NOTE — Telephone Encounter (Signed)
 Copied from CRM [id number]. Topic: Medicare AWV >> Feb 03, 2023  1:50 PM Nathanel DEL wrote: Reason for CRM: Called LVM 02/03/2023 to schedule AWV. Please schedule office or virtual visits.  Nathanel Paschal; Care Guide Ambulatory Clinical Support [facility] Direct Dial: [phone]

## 2023-03-14 LAB — HEMOGLOBIN A1C: Hemoglobin A1C: 7.7

## 2023-04-06 ENCOUNTER — Ambulatory Visit: Payer: Medicare HMO | Admitting: Family Medicine

## 2023-04-17 ENCOUNTER — Ambulatory Visit (INDEPENDENT_AMBULATORY_CARE_PROVIDER_SITE_OTHER): Admitting: Family Medicine

## 2023-04-17 ENCOUNTER — Encounter: Payer: Self-pay | Admitting: Family Medicine

## 2023-04-17 VITALS — BP 118/60 | HR 77 | Temp 98.3°F | Resp 20 | Ht 62.25 in | Wt 130.5 lb

## 2023-04-17 DIAGNOSIS — E108 Type 1 diabetes mellitus with unspecified complications: Secondary | ICD-10-CM | POA: Diagnosis not present

## 2023-04-17 DIAGNOSIS — M810 Age-related osteoporosis without current pathological fracture: Secondary | ICD-10-CM

## 2023-04-17 DIAGNOSIS — I700 Atherosclerosis of aorta: Secondary | ICD-10-CM

## 2023-04-17 DIAGNOSIS — F39 Unspecified mood [affective] disorder: Secondary | ICD-10-CM | POA: Diagnosis not present

## 2023-04-17 DIAGNOSIS — E1069 Type 1 diabetes mellitus with other specified complication: Secondary | ICD-10-CM

## 2023-04-17 DIAGNOSIS — E785 Hyperlipidemia, unspecified: Secondary | ICD-10-CM

## 2023-04-17 DIAGNOSIS — E1059 Type 1 diabetes mellitus with other circulatory complications: Secondary | ICD-10-CM

## 2023-04-17 DIAGNOSIS — E559 Vitamin D deficiency, unspecified: Secondary | ICD-10-CM | POA: Diagnosis not present

## 2023-04-17 DIAGNOSIS — I714 Abdominal aortic aneurysm, without rupture, unspecified: Secondary | ICD-10-CM

## 2023-04-17 DIAGNOSIS — I152 Hypertension secondary to endocrine disorders: Secondary | ICD-10-CM

## 2023-04-17 NOTE — Progress Notes (Unsigned)
 SUBJECTIVE:   Chief Complaint  Patient presents with   Diabetes    6 month follow up   HPI Presents for follow up chronic disease management  Discussed the use of AI scribe software for clinical note transcription with the patient, who gave verbal consent to proceed.  History of Present Illness Lisa [name] is a 73 year old female who presents for follow-up after double cataract surgery and to discuss upcoming skin surgery affecting vision.  She recently underwent double cataract surgery and is scheduled for skin removal surgery in June due to its impact on her vision. She has already had a consultation regarding this procedure.  She is scheduled to see her cardiologist this week. No recent illnesses, chest pain, shortness of breath, or leg swelling. Her last blood work was in August, and she plans to return for fasting blood work to have updated results for her cardiology appointment. She is under the care of an endocrinologist, and her A1c and other readings were satisfactory during her last visit a month ago. However, her kidney function was not checked during that visit.  She has weaned herself off Xanax due to concerns about its long-term effects, particularly after learning about potential memory issues. She manages her anxiety without it, although she still experiences hand tremors, which affect her ability to write during Bible study. Her husband has provided her with a tablet to assist with this.  She has not had a mammogram since 2017 and plans to schedule one with her OBGYN. She is no longer on Prolia injections and is currently taking vitamin D.      PERTINENT PMH / PSH: As above  OBJECTIVE:  BP 118/60   Pulse 77   Temp 98.3 F (36.8 C)   Resp 20   Ht 5' 2.25" (1.581 m)   Wt 130 lb 8 oz (59.2 kg)   SpO2 98%   BMI 23.68 kg/m    Physical Exam Vitals reviewed.  Constitutional:      General: She is not in acute distress.    Appearance: Normal appearance. She  is normal weight. She is not ill-appearing, toxic-appearing or diaphoretic.  Eyes:     General:        Right eye: No discharge.        Left eye: No discharge.     Conjunctiva/sclera: Conjunctivae normal.  Cardiovascular:     Rate and Rhythm: Normal rate and regular rhythm.     Heart sounds: Normal heart sounds.  Pulmonary:     Effort: Pulmonary effort is normal.     Breath sounds: Normal breath sounds.  Abdominal:     General: Bowel sounds are normal.  Musculoskeletal:        General: Normal range of motion.  Skin:    General: Skin is warm and dry.  Neurological:     General: No focal deficit present.     Mental Status: She is alert and oriented to person, place, and time. Mental status is at baseline.  Psychiatric:        Mood and Affect: Mood normal.        Behavior: Behavior normal.        Thought Content: Thought content normal.        Judgment: Judgment normal.           04/17/2023    9:48 AM 10/07/2022   10:41 AM 12/16/2016    2:13 PM  Depression screen PHQ 2/9  Decreased Interest 0 0  1  Down, Depressed, Hopeless 0 0 0  PHQ - 2 Score 0 0 1  Altered sleeping 0 0 0  Tired, decreased energy 0 0 3  Change in appetite 0 0 2  Feeling bad or failure about yourself  0 0 0  Trouble concentrating 0 0 0  Moving slowly or fidgety/restless 0 0 0  Suicidal thoughts 0 0 0  PHQ-9 Score 0 0 6  Difficult doing work/chores Not difficult at all Not difficult at all Somewhat difficult      04/17/2023    9:48 AM 10/07/2022   10:42 AM  GAD 7 : Generalized Anxiety Score  Nervous, Anxious, on Edge 0 0  Control/stop worrying 0 0  Worry too much - different things 0 0  Trouble relaxing 0 0  Restless 0 0  Easily annoyed or irritable 0 0  Afraid - awful might happen 0 0  Total GAD 7 Score 0 0  Anxiety Difficulty Not difficult at all Not difficult at all    ASSESSMENT/PLAN:  Type 1 diabetes mellitus with complications (HCC) Assessment & Plan: Chronic Recent A1c 7.7 Insulin  pump Dexcom Follow up with Endocrinology, Dr Shawnee Knapp  Foot exam annually   Orders: -     Comprehensive metabolic panel; Future -     CBC with Differential/Platelet; Future -     Microalbumin / creatinine urine ratio; Future  Vitamin D deficiency -     VITAMIN D 25 Hydroxy (Vit-D Deficiency, Fractures); Future  Hyperlipidemia due to type 1 diabetes mellitus (HCC) -     Lipid panel; Future  Mood disorder [facility]) Assessment & Plan: Anxiety is manageable after discontinuing Xanax due to concerns about its association with Alzheimer's disease. Discussed SSRIs like Celexa as a safer alternative for managing anxiety, without memory loss or addiction risks. - Consider SSRIs like Celexa for anxiety management if needed.   Abdominal aortic aneurysm (AAA) without rupture, unspecified part [facility]) Assessment & Plan: Chronic.  3.2 cm dilation. Asymptomatic BP well controlled Repeat imaging 10/2023 Follows with Cardiology   Aortic atherosclerosis [facility]) Assessment & Plan: Chronic Recent LDL 55 Continue Lipitor 40 mg daily Repeat Lipids    Osteoporosis, unspecified osteoporosis type, unspecified pathological fracture presence Assessment & Plan: Chronic.  Tried 2 years of Forteo, Reclast, Fosamax in past. Recent Dexa improved, showing stable osteopenia Not on Prolia 60 mg injections, and managed with Vitamin D  Refill Vitamin D 1.25 mg weekly Check Vitamin D level Repeat DEXA due 2026    Hypertension associated with type 1 diabetes mellitus (HCC) Assessment & Plan: Well controlled.  Continue Losartan 25 mg daily Follows with Cardiology       PDMP reviewed  Return if symptoms worsen or fail to improve, for PCP.  Dana Allan, MD

## 2023-04-17 NOTE — Patient Instructions (Addendum)
 It was a pleasure meeting you today. Thank you for allowing me to take part in your health care.  Our goals for today as we discussed include:  Schedule fasting lab appointment  Refills on Atorvastatin good until Dec 2024  This is a list of the screening recommended for you and due dates:  Health Maintenance  Topic Date Due   Medicare Annual Wellness Visit  Never done   Eye exam for diabetics  Never done   Yearly kidney health urinalysis for diabetes  Never done   Hepatitis C Screening  Never done   Mammogram  02/03/2017   COVID-19 Vaccine (3 - Pfizer risk series) 05/08/2019   DTaP/Tdap/Td vaccine (2 - Tdap) 10/10/2019   Zoster (Shingles) Vaccine (1 of 2) 07/18/2023*   Pneumonia Vaccine (2 of 2 - PCV) 04/16/2024*   Hemoglobin A1C  09/11/2023   Yearly kidney function blood test for diabetes  11/09/2023   Complete foot exam   12/07/2023   Colon Cancer Screening  03/24/2025   Flu Shot  Completed   DEXA scan (bone density measurement)  Completed   HPV Vaccine  Aged Out  *Topic was postponed. The date shown is not the original due date.      If you have any questions or concerns, please do not hesitate to call the office at [phone].  I look forward to our next visit and until then take care and stay safe.  Regards,   Dana Allan, MD   [facility]

## 2023-04-20 ENCOUNTER — Ambulatory Visit: Payer: Medicare HMO | Attending: Internal Medicine | Admitting: Internal Medicine

## 2023-04-20 ENCOUNTER — Encounter: Payer: Self-pay | Admitting: Family Medicine

## 2023-04-20 ENCOUNTER — Encounter: Payer: Self-pay | Admitting: Internal Medicine

## 2023-04-20 VITALS — BP 126/60 | HR 76 | Ht 62.0 in | Wt 131.2 lb

## 2023-04-20 DIAGNOSIS — I480 Paroxysmal atrial fibrillation: Secondary | ICD-10-CM | POA: Diagnosis not present

## 2023-04-20 DIAGNOSIS — I25118 Atherosclerotic heart disease of native coronary artery with other forms of angina pectoris: Secondary | ICD-10-CM

## 2023-04-20 DIAGNOSIS — I714 Abdominal aortic aneurysm, without rupture, unspecified: Secondary | ICD-10-CM

## 2023-04-20 DIAGNOSIS — I2510 Atherosclerotic heart disease of native coronary artery without angina pectoris: Secondary | ICD-10-CM

## 2023-04-20 DIAGNOSIS — E782 Mixed hyperlipidemia: Secondary | ICD-10-CM

## 2023-04-20 DIAGNOSIS — I10 Essential (primary) hypertension: Secondary | ICD-10-CM | POA: Diagnosis not present

## 2023-04-20 DIAGNOSIS — E1069 Type 1 diabetes mellitus with other specified complication: Secondary | ICD-10-CM

## 2023-04-20 NOTE — Progress Notes (Signed)
 Cardiology Office Note:  .   Date:  04/22/2023  ID:  Lisa [name], DOB Apr 10, 1950, MRN [id number] PCP: Dana Allan, MD  [facility] Providers Cardiologist:  Yvonne Kendall, MD     History of Present Illness: .   Lisa [name] is a 73 y.o. female with history of coronary artery disease status post remote coronary intervention at [facility] (the late 90s) and cardiac arrest in the setting of anaphylaxis (10/2016) with catheterization demonstrating severe 2-vessel CAD (subsequent atherectomy and PCI to RCA), recurrent pericardial effusion, type 1 diabetes mellitus, peripheral vascular disease with AAA, right lower lobe pneumonia with mucous plugging, and spontaneous right thigh hematoma in the setting of anticoagulation with heparin, who presents for follow-up of CAD and AAA.  I last saw her in 10/2022, at which time she was feeling well.  She is in the process of transitioning from Dr. Patrecia Pace to a new PCP given his retirement.  Due to mildly elevated blood pressure, we agreed to add losartan 25 mg daily.  Today, Ms. [name] reports that she has been feeling well.  Her blood pressure and diabetes have been under good control.  She is trying to walk regularly and is also helping out an older couple through her church, which keeps her active.  She denies chest pain, shortness of breath, palpitations, lightheadedness, and edema.  She now follows with Dr. Shawnee Knapp in [place] for management of her DM.  ROS: See HPI  Studies Reviewed: Marland Kitchen   EKG Interpretation Date/Time:  Thursday April 20 2023 09:01:23 EDT Ventricular Rate:  76 PR Interval:  142 QRS Duration:  84 QT Interval:  376 QTC Calculation: 423 R Axis:   42  Text Interpretation: Normal sinus rhythm Normal ECG When compared with ECG of 26-Oct-2022 09:16, No significant change was found Confirmed by Nitzia Perren, Cristal Deer [phone]) on 04/20/2023 9:18:44 AM    Risk Assessment/Calculations:    CHA2DS2-VASc Score = 5   This indicates a 7.2% annual  risk of stroke. The patient's score is based upon: CHF History: 0 HTN History: 1 Diabetes History: 1 Stroke History: 0 Vascular Disease History: 1 Age Score: 1 Gender Score: 1        Physical Exam:   VS:  BP 126/60 (BP Location: Left Arm, Patient Position: Sitting, Cuff Size: Normal)   Pulse 76   Ht 5\' 2"  (1.575 m)   Wt 131 lb 4 oz (59.5 kg)   BMI 24.01 kg/m    Wt Readings from Last 3 Encounters:  04/20/23 131 lb 4 oz (59.5 kg)  04/17/23 130 lb 8 oz (59.2 kg)  11/30/22 130 lb 12.8 oz (59.3 kg)    General:  NAD. Neck: No JVD or HJR. Lungs: Clear to auscultation bilaterally without wheezes or crackles. Heart: Regular rate and rhythm without murmurs, rubs, or gallops. Abdomen: Soft, nontender, nondistended. Extremities: No lower extremity edema.  ASSESSMENT AND PLAN: .    Coronary artery disease: No angina reported.  Continue secondary prevention with aspirin and atorvastatin for target LDL less than 70 (ideally below 55).  She is scheduled for lipid panel through Dr. Claris Che office later this month.  Paroxysmal atrial fibrillation: Only episode occurred in the setting of STEMI precipitated by anaphylaxis to cephalosporin.  Given spontaneous thigh hematoma while on heparin during that admission and no further atrial fibrillation, we will continue to defer anticoagulation.  Continue diltiazem.  AAA: No symptoms reported.  Annual follow-up duplex already scheduled for 07/2023.  Continue aspirin and statin therapy as well  as blood pressure control.  Hypertension: Blood pressure well-controlled today.  Continue current regimen of diltiazem and losartan.  Hyperlipidemia associated with type 1 diabetes mellitus: Continue atorvastatin 40 mg daily with upcoming labs through Dr. Claris Che office with target LDL less than 70 (ideally below 55).  Ongoing management of DM per Dr. Shawnee Knapp.    Dispo: Return to clinic in 6 months  Signed, Yvonne Kendall, MD

## 2023-04-20 NOTE — Assessment & Plan Note (Signed)
 Chronic Recent A1c 7.7 Insulin pump Dexcom Follow up with Endocrinology, Dr Shawnee Knapp  Foot exam annually

## 2023-04-20 NOTE — Assessment & Plan Note (Addendum)
 Well controlled.  Continue Losartan 25 mg daily Follows with Cardiology

## 2023-04-20 NOTE — Patient Instructions (Signed)
 Medication Instructions:  Your physician recommends that you continue on your current medications as directed. Please refer to the Current Medication list given to you today.   *If you need a refill on your cardiac medications before your next appointment, please call your pharmacy*   Lab Work: No labs ordered today    Testing/Procedures: No test ordered today    Follow-Up: At [facility], you and your health needs are our priority.  As part of our continuing mission to provide you with exceptional heart care, we have created designated Provider Care Teams.  These Care Teams include your primary Cardiologist (physician) and Advanced Practice Providers (APPs -  Physician Assistants and Nurse Practitioners) who all work together to provide you with the care you need, when you need it.  We recommend signing up for the patient portal called "MyChart".  Sign up information is provided on this After Visit Summary.  MyChart is used to connect with patients for Virtual Visits (Telemedicine).  Patients are able to view lab/test results, encounter notes, upcoming appointments, etc.  Non-urgent messages can be sent to your provider as well.   To learn more about what you can do with MyChart, go to [email or web address].    Your next appointment:   6 month(s)  Provider:   You may see Yvonne Kendall, MD or one of the following Advanced Practice Providers on your designated Care Team:   Nicolasa Ducking, NP Eula Listen, PA-C Cadence Fransico Michael, PA-C Charlsie Quest, NP Carlos Levering, NP

## 2023-04-20 NOTE — Assessment & Plan Note (Signed)
 Anxiety is manageable after discontinuing Xanax due to concerns about its association with Alzheimer's disease. Discussed SSRIs like Celexa as a safer alternative for managing anxiety, without memory loss or addiction risks. - Consider SSRIs like Celexa for anxiety management if needed.

## 2023-04-20 NOTE — Assessment & Plan Note (Signed)
 Chronic.  Tried 2 years of Forteo, Reclast, Fosamax in past. Recent Dexa improved, showing stable osteopenia Not on Prolia 60 mg injections, and managed with Vitamin D  Refill Vitamin D 1.25 mg weekly Check Vitamin D level Repeat DEXA due 2026

## 2023-04-20 NOTE — Assessment & Plan Note (Signed)
 Chronic Recent LDL 55 Continue Lipitor 40 mg daily Repeat Lipids

## 2023-04-20 NOTE — Assessment & Plan Note (Signed)
 Chronic.  3.2 cm dilation. Asymptomatic BP well controlled Repeat imaging 10/2023 Follows with Cardiology

## 2023-04-22 ENCOUNTER — Encounter: Payer: Self-pay | Admitting: Internal Medicine

## 2023-04-25 ENCOUNTER — Ambulatory Visit: Payer: Medicare HMO

## 2023-05-01 ENCOUNTER — Other Ambulatory Visit (INDEPENDENT_AMBULATORY_CARE_PROVIDER_SITE_OTHER)

## 2023-05-01 DIAGNOSIS — E1069 Type 1 diabetes mellitus with other specified complication: Secondary | ICD-10-CM | POA: Diagnosis not present

## 2023-05-01 DIAGNOSIS — E559 Vitamin D deficiency, unspecified: Secondary | ICD-10-CM

## 2023-05-01 DIAGNOSIS — E785 Hyperlipidemia, unspecified: Secondary | ICD-10-CM | POA: Diagnosis not present

## 2023-05-01 DIAGNOSIS — E108 Type 1 diabetes mellitus with unspecified complications: Secondary | ICD-10-CM | POA: Diagnosis not present

## 2023-05-01 LAB — CBC WITH DIFFERENTIAL/PLATELET
Basophils Absolute: 0.1 10*3/uL (ref 0.0–0.1)
Basophils Relative: 0.7 % (ref 0.0–3.0)
Eosinophils Absolute: 0.4 10*3/uL (ref 0.0–0.7)
Eosinophils Relative: 4.5 % (ref 0.0–5.0)
HCT: 40.4 % (ref 36.0–46.0)
Hemoglobin: 13.5 g/dL (ref 12.0–15.0)
Lymphocytes Relative: 30.9 % (ref 12.0–46.0)
Lymphs Abs: 2.9 10*3/uL (ref 0.7–4.0)
MCHC: 33.5 g/dL (ref 30.0–36.0)
MCV: 86 fl (ref 78.0–100.0)
Monocytes Absolute: 0.7 10*3/uL (ref 0.1–1.0)
Monocytes Relative: 7.5 % (ref 3.0–12.0)
Neutro Abs: 5.4 10*3/uL (ref 1.4–7.7)
Neutrophils Relative %: 56.4 % (ref 43.0–77.0)
Platelets: 300 10*3/uL (ref 150.0–400.0)
RBC: 4.7 Mil/uL (ref 3.87–5.11)
RDW: 14 % (ref 11.5–15.5)
WBC: 9.5 10*3/uL (ref 4.0–10.5)

## 2023-05-01 LAB — COMPREHENSIVE METABOLIC PANEL WITH GFR
ALT: 18 U/L (ref 0–35)
AST: 20 U/L (ref 0–37)
Albumin: 4.3 g/dL (ref 3.5–5.2)
Alkaline Phosphatase: 114 U/L (ref 39–117)
BUN: 19 mg/dL (ref 6–23)
CO2: 29 meq/L (ref 19–32)
Calcium: 9.1 mg/dL (ref 8.4–10.5)
Chloride: 102 meq/L (ref 96–112)
Creatinine, Ser: 0.83 mg/dL (ref 0.40–1.20)
GFR: 71.01 mL/min (ref >60.00)
Glucose, Bld: 138 mg/dL — ABNORMAL HIGH (ref 70–99)
Potassium: 4.3 meq/L (ref 3.5–5.1)
Sodium: 138 meq/L (ref 135–145)
Total Bilirubin: 0.6 mg/dL (ref 0.2–1.2)
Total Protein: 7.1 g/dL (ref 6.0–8.3)

## 2023-05-01 LAB — LIPID PANEL
Cholesterol: 121 mg/dL (ref 0–200)
HDL: 60.8 mg/dL (ref >39.00)
LDL Cholesterol: 51 mg/dL (ref 0–99)
NonHDL: 60.01
Total CHOL/HDL Ratio: 2
Triglycerides: 47 mg/dL (ref 0.0–149.0)
VLDL: 9.4 mg/dL (ref 0.0–40.0)

## 2023-05-01 LAB — MICROALBUMIN / CREATININE URINE RATIO
Creatinine,U: 72 mg/dL
Microalb Creat Ratio: UNDETERMINED mg/g (ref 0.0–30.0)
Microalb, Ur: <0.7 mg/dL

## 2023-05-01 LAB — VITAMIN D 25 HYDROXY (VIT D DEFICIENCY, FRACTURES): VITD: 61.37 ng/mL (ref 30.00–100.00)

## 2023-05-31 ENCOUNTER — Other Ambulatory Visit: Payer: Self-pay | Admitting: Family Medicine

## 2023-05-31 MED ORDER — VITAMIN D (ERGOCALCIFEROL) 1.25 MG (50000 UNIT) PO CAPS: 50000.0000 [IU] | ORAL_CAPSULE | ORAL | 0 refills | Status: DC

## 2023-05-31 NOTE — Telephone Encounter (Signed)
 Copied from CRM [id number]. Topic: Clinical - Medication Refill >> May 31, 2023 10:12 AM Alethia Huxley E wrote: Most Recent Primary Care Visit:  Provider: [facility]  Department: [facility]  Visit Type: LAB VISIT  Date: 05/01/2023  Medication: Vitamin D , Ergocalciferol , (DRISDOL) 50000 units CAPS capsule  Has the patient contacted their pharmacy? Yes (Agent: If no, request that the patient contact the pharmacy for the refill. If patient does not wish to contact the pharmacy document the reason why and proceed with request.) (Agent: If yes, when and what did the pharmacy advise?)  Is this the correct pharmacy for this prescription? Yes If no, delete pharmacy and type the correct one.  This is the patient's preferred pharmacy:  [id number] - [place], [place] - [address] AT [facility] [address] [facility] [address] [place] [address] Phone: [phone] Fax: [phone]   Has the prescription been filled recently? No  Is the patient out of the medication? Yes  Has the patient been seen for an appointment in the last year OR does the patient have an upcoming appointment? Yes  Can we respond through MyChart? Yes  Agent: Please be advised that Rx refills may take up to 3 business days. We ask that you follow-up with your pharmacy.

## 2023-06-26 ENCOUNTER — Other Ambulatory Visit: Payer: Self-pay | Admitting: Internal Medicine

## 2023-06-29 LAB — OPHTHALMOLOGY REPORT-SCANNED

## 2023-06-30 ENCOUNTER — Telehealth: Admitting: Physician Assistant

## 2023-06-30 DIAGNOSIS — B9689 Other specified bacterial agents as the cause of diseases classified elsewhere: Secondary | ICD-10-CM | POA: Diagnosis not present

## 2023-06-30 DIAGNOSIS — J208 Acute bronchitis due to other specified organisms: Secondary | ICD-10-CM | POA: Diagnosis not present

## 2023-06-30 MED ORDER — DOXYCYCLINE HYCLATE 100 MG PO TABS
100.0000 mg | ORAL_TABLET | Freq: Two times a day (BID) | ORAL | 0 refills | Status: DC
Start: 2023-06-30 — End: 2023-10-19

## 2023-06-30 MED ORDER — BENZONATATE 100 MG PO CAPS
100.0000 mg | ORAL_CAPSULE | Freq: Three times a day (TID) | ORAL | 0 refills | Status: DC | PRN
Start: 2023-06-30 — End: 2023-10-19

## 2023-06-30 NOTE — Progress Notes (Signed)

## 2023-07-01 ENCOUNTER — Other Ambulatory Visit: Payer: Self-pay | Admitting: Internal Medicine

## 2023-07-14 ENCOUNTER — Other Ambulatory Visit: Payer: Medicare HMO

## 2023-07-14 ENCOUNTER — Other Ambulatory Visit (HOSPITAL_COMMUNITY)

## 2023-07-19 ENCOUNTER — Ambulatory Visit (HOSPITAL_COMMUNITY)
Admission: RE | Admit: 2023-07-19 | Discharge: 2023-07-19 | Disposition: A | Discharge status: Home / Self Care | Source: Ambulatory Visit | Attending: Internal Medicine | Admitting: Internal Medicine

## 2023-07-19 DIAGNOSIS — I714 Abdominal aortic aneurysm, without rupture, unspecified: Secondary | ICD-10-CM

## 2023-07-26 ENCOUNTER — Ambulatory Visit: Payer: Self-pay | Admitting: Internal Medicine

## 2023-08-08 ENCOUNTER — Other Ambulatory Visit: Payer: Self-pay

## 2023-08-09 NOTE — Telephone Encounter (Signed)
 Pt of Dr Hope. Has TOC appt scheduled in 10/2023. Received request for refill of prescription vitamin D . Her vitamin D  level has been normal.  Need to confirm if she has ever been on over the counter vitamin D .  May be able to change to over the counter vitamin D  for maintenance, since vitamin D  level has been in the normal range.  See if agreeable and need information above.

## 2023-08-09 NOTE — Telephone Encounter (Signed)
 Spoke with patient to clarify if she has been taking OTC Vitamin D . Patient says she has not. Dr Hope sent in her last prescription without any refills. Patient was made aware that her Vitamin D  has been normal. Patient is asking what she should do next? Should she starting taking over the counter medication until her scheduled appointment in September? Please advise?

## 2023-08-09 NOTE — Telephone Encounter (Signed)
 Given that her vitamin D  level has been normal, can try an over the counter vitamin D  - with plans to check a f/u vitamin D  level in the future with PCP. Can stop prescription vitamin D  and have her start over the count vitamin D3 2000 units q day.

## 2023-08-10 NOTE — Telephone Encounter (Signed)
 Patient notified and made aware of recommendations. Patient verbalized understanding and has no further questions at this time.

## 2023-09-07 ENCOUNTER — Other Ambulatory Visit: Payer: Self-pay | Admitting: Internal Medicine

## 2023-09-26 LAB — HEMOGLOBIN A1C: Hemoglobin A1C: 7.7

## 2023-10-19 ENCOUNTER — Ambulatory Visit: Attending: Internal Medicine | Admitting: Internal Medicine

## 2023-10-19 ENCOUNTER — Encounter: Payer: Self-pay | Admitting: Internal Medicine

## 2023-10-19 ENCOUNTER — Ambulatory Visit

## 2023-10-19 VITALS — BP 98/60 | HR 72 | Temp 98.3°F | Ht 62.0 in | Wt 127.0 lb

## 2023-10-19 VITALS — BP 128/64 | HR 77 | Ht 62.0 in | Wt 126.5 lb

## 2023-10-19 DIAGNOSIS — E108 Type 1 diabetes mellitus with unspecified complications: Secondary | ICD-10-CM

## 2023-10-19 DIAGNOSIS — E785 Hyperlipidemia, unspecified: Secondary | ICD-10-CM

## 2023-10-19 DIAGNOSIS — I714 Abdominal aortic aneurysm, without rupture, unspecified: Secondary | ICD-10-CM

## 2023-10-19 DIAGNOSIS — I480 Paroxysmal atrial fibrillation: Secondary | ICD-10-CM | POA: Diagnosis not present

## 2023-10-19 DIAGNOSIS — Z23 Encounter for immunization: Secondary | ICD-10-CM | POA: Diagnosis not present

## 2023-10-19 DIAGNOSIS — J309 Allergic rhinitis, unspecified: Secondary | ICD-10-CM

## 2023-10-19 DIAGNOSIS — I4891 Unspecified atrial fibrillation: Secondary | ICD-10-CM

## 2023-10-19 DIAGNOSIS — E1069 Type 1 diabetes mellitus with other specified complication: Secondary | ICD-10-CM

## 2023-10-19 DIAGNOSIS — I10 Essential (primary) hypertension: Secondary | ICD-10-CM

## 2023-10-19 DIAGNOSIS — Z87892 Personal history of anaphylaxis: Secondary | ICD-10-CM

## 2023-10-19 DIAGNOSIS — I2510 Atherosclerotic heart disease of native coronary artery without angina pectoris: Secondary | ICD-10-CM

## 2023-10-19 DIAGNOSIS — Z8679 Personal history of other diseases of the circulatory system: Secondary | ICD-10-CM

## 2023-10-19 DIAGNOSIS — Z1231 Encounter for screening mammogram for malignant neoplasm of breast: Secondary | ICD-10-CM

## 2023-10-19 DIAGNOSIS — R059 Cough, unspecified: Secondary | ICD-10-CM | POA: Diagnosis not present

## 2023-10-19 MED ORDER — ATORVASTATIN CALCIUM 40 MG PO TABS: 40.0000 mg | ORAL_TABLET | Freq: Every day | ORAL | 3 refills | Status: AC

## 2023-10-19 MED ORDER — LOSARTAN POTASSIUM 25 MG PO TABS: 25.0000 mg | ORAL_TABLET | Freq: Every day | ORAL | 3 refills | Status: AC

## 2023-10-19 NOTE — Assessment & Plan Note (Signed)
 Occurred in the setting of cardiac arrest in 2018.  RRR today, on Diltiazem  240 mg daily, continue. F/u with Dr. Mady.

## 2023-10-19 NOTE — Assessment & Plan Note (Signed)
 On atorvastatin  40 mg, continue. LDL cholesterol within goal of <70 during lab on 04/2023, repeat lipid panel, cmp prior to next OV.

## 2023-10-19 NOTE — Progress Notes (Signed)
 Established Patient Office Visit TOC from Dr. Hope    Subjective  Patient ID: Lisa [name], female    DOB: [birth year]  Age: 73 y.o. MRN: [id number]  Chief Complaint  Patient presents with   Establish Care    She  has a past medical history of AAA (abdominal aortic aneurysm) (HCC), Atrial fibrillation with rapid ventricular response (HCC), CAD (coronary artery disease), Grade II diastolic dysfunction, Groin hematoma, High cholesterol, History of CHF (congestive heart failure) (10/19/2023), History of kidney stones, Kidney stone, Left ureteral stone, Mucus plugging of bronchi, Non-ST elevation (NSTEMI) myocardial infarction (HCC) (10/12/2016), NSTEMI (non-ST elevated myocardial infarction) (HCC) (10/11/2016), PAF (paroxysmal atrial fibrillation) (HCC), Paroxysmal atrial fibrillation (HCC), Pericardial effusion, Pneumonia (02/2016), Pneumonia due to infectious organism (11/25/2016), PVD (peripheral vascular disease) (HCC), Shock liver (10/13/2016), Type I diabetes mellitus (HCC), and Ventricular fibrillation (HCC).  HPI  Discussed the use of AI scribe software for clinical note transcription with the patient, who gave verbal consent to proceed.  History of Present Illness Lisa [name] is a 73 year old female with type 1 diabetes, CAD, AAA, h/o anaphylaxis with cardiac arrest, h/o smoking and proximal atrial fibrillation who presents for a transfer of care from previous PCP and routine follow-up visit.  She has type 1 diabetes managed with an insulin  pump and follows up with her endocrinologist regularly, with her next appointment scheduled in December.  Dr. Donnice Lipps at [facility], last OV 09/26/23 and A1c 7.7%.  She monitors her blood pressure at home, noting it tends to be on the lower side, and recently consulted her cardiologist.  Her atrial fibrillation is managed with diltiazem  240 mg daily. She also takes Lipitor  40 mg for cholesterol management. Takes Aspirin  81 mg for CAD  (AAA) and f/u with cardiologist Dr. Mady. Has nitroglycerin  prescribed to her but has not needed to use it. Saw her cardiologist this morning.   She has a history of smoking, having quit in 2018 after 20 years of smoking approximately 10 cigarettes per day. She experiences a morning cough with phlegm, which she attributes to post-nasal drip. This symptom began in early spring, coinciding with the discovery of mold in her air conditioning unit, which has since been cleaned. She underwent a pulmonary function test in 2018.  She experiences allergy like symptoms such as rhinorrhea, throat clearing, cough. She reports a history of post-nasal drip, causing a sensation of 'thick and coughing up junk' in the morning.  She has a significant allergy history, including an anaphylactic reaction to Rocephin  (ceftriaxone ) in 2000, which resulted in cardiac arrest and required intubation. She has no known allergies to other medications.  She maintains an active lifestyle, walking daily, assisting an elderly couple from her church, and participating in church renovations. She also frequently visits her granddaughter at [facility].   ROS As per HPI    Objective:     BP 98/60 (BP Location: Right Arm, Patient Position: Sitting, Cuff Size: Small)   Pulse 72   Temp 98.3 F (36.8 C) (Oral)   Ht 5' 2 (1.575 m)   Wt 127 lb (57.6 kg)   SpO2 97%   BMI 23.23 kg/m      10/19/2023    9:55 AM 04/17/2023    9:48 AM 10/07/2022   10:41 AM  Depression screen PHQ 2/9  Decreased Interest 0 0 0  Down, Depressed, Hopeless 0 0 0  PHQ - 2 Score 0 0 0  Altered sleeping 0 0 0  Tired,  decreased energy 0 0 0  Change in appetite 0 0 0  Feeling bad or failure about yourself  0 0 0  Trouble concentrating 0 0 0  Moving slowly or fidgety/restless 0 0 0  Suicidal thoughts 0 0 0  PHQ-9 Score 0 0 0  Difficult doing work/chores Not difficult at all Not difficult at all Not difficult at all      10/19/2023    9:55 AM 04/17/2023     9:48 AM 10/07/2022   10:42 AM  GAD 7 : Generalized Anxiety Score  Nervous, Anxious, on Edge 0 0 0  Control/stop worrying 0 0 0  Worry too much - different things 0 0 0  Trouble relaxing 0 0 0  Restless 0 0 0  Easily annoyed or irritable 0 0 0  Afraid - awful might happen 0 0 0  Total GAD 7 Score 0 0 0  Anxiety Difficulty Not difficult at all Not difficult at all Not difficult at all      10/19/2023    9:55 AM 04/17/2023    9:48 AM 10/07/2022   10:41 AM  Depression screen PHQ 2/9  Decreased Interest 0 0 0  Down, Depressed, Hopeless 0 0 0  PHQ - 2 Score 0 0 0  Altered sleeping 0 0 0  Tired, decreased energy 0 0 0  Change in appetite 0 0 0  Feeling bad or failure about yourself  0 0 0  Trouble concentrating 0 0 0  Moving slowly or fidgety/restless 0 0 0  Suicidal thoughts 0 0 0  PHQ-9 Score 0 0 0  Difficult doing work/chores Not difficult at all Not difficult at all Not difficult at all      10/19/2023    9:55 AM 04/17/2023    9:48 AM 10/07/2022   10:42 AM  GAD 7 : Generalized Anxiety Score  Nervous, Anxious, on Edge 0 0 0  Control/stop worrying 0 0 0  Worry too much - different things 0 0 0  Trouble relaxing 0 0 0  Restless 0 0 0  Easily annoyed or irritable 0 0 0  Afraid - awful might happen 0 0 0  Total GAD 7 Score 0 0 0  Anxiety Difficulty Not difficult at all Not difficult at all Not difficult at all   SDOH Screenings   Food Insecurity: No Food Insecurity (03/30/2023)  Housing: Low Risk  (03/30/2023)  Transportation Needs: No Transportation Needs (03/30/2023)  Utilities: Not At Risk (03/11/2023)   Received from [facility]  Alcohol Screen: Low Risk  (03/30/2023)  Depression (PHQ2-9): Low Risk  (10/19/2023)  Financial Resource Strain: Low Risk  (03/30/2023)  Physical Activity: Sufficiently Active (03/30/2023)  Recent Concern: Physical Activity - Insufficiently Active (03/11/2023)   Received from [facility]  Social Connections: Socially Integrated (03/30/2023)   Stress: No Stress Concern Present (03/30/2023)  Tobacco Use: Medium Risk (10/19/2023)     Physical Exam Constitutional:      General: She is not in acute distress.    Appearance: Normal appearance.  HENT:     Head: Normocephalic and atraumatic.     Right Ear: Tympanic membrane normal.     Left Ear: Tympanic membrane normal.     Nose:     Comments: Nasal mucosa pale and boggy     Mouth/Throat:     Mouth: Mucous membranes are moist.  Neck:     Thyroid : No thyroid  mass or thyroid  tenderness.  Cardiovascular:     Rate and Rhythm: Normal  rate and regular rhythm.  Pulmonary:     Effort: Pulmonary effort is normal.     Breath sounds: Normal breath sounds.  Abdominal:     General: Bowel sounds are normal.     Palpations: Abdomen is soft.  Musculoskeletal:     Cervical back: Neck supple. No rigidity.     Right lower leg: No edema.     Left lower leg: No edema.  Skin:    General: Skin is warm.  Neurological:     Mental Status: She is alert and oriented to person, place, and time.  Psychiatric:        Mood and Affect: Mood normal.        Behavior: Behavior normal.        No results found for any visits on 10/19/23.  The ASCVD Risk score (Arnett DK, et al., 2019) failed to calculate for the following reasons:   Risk score cannot be calculated because patient has a medical history suggesting prior/existing ASCVD     Assessment & Plan:   Cough in adult Assessment & Plan: Chronic throat clearing with morning cough with intermittent phlegm production.  H/O smoking, smoked for about 20 years, 1/2 pack per day and quit smoking in 2018.  Recommend PFT to r/o or establish diagnosis of COPD.  Pulmonology referral made.   Orders: -     Pulmonary Visit  Essential hypertension Assessment & Plan: BP controlled and within goal.  CMP from 04/2023 normal electrolytes.  Continue Losartan  25 mg daily and Diltiazem  (240 mg) daily. Had a fib with RVR when she had cardiac arrest in  2018. Developed thigh hematoma so anticoagulation deferred per cardiology. Continue annual f/u with cardiologist.   Orders: -     Losartan  Potassium; Take 1 tablet (25 mg total) by mouth daily.  Dispense: 90 tablet; Refill: 3 -     Comprehensive metabolic panel with GFR; Future  Hyperlipidemia due to type 1 diabetes mellitus (HCC) Assessment & Plan: On atorvastatin  40 mg, continue. LDL cholesterol within goal of <70 during lab on 04/2023, repeat lipid panel, cmp prior to next OV.   Orders: -     Lipid panel; Future -     Comprehensive metabolic panel with GFR; Future  Type 1 diabetes mellitus with complications [facility]) Assessment & Plan: Chronic Recent A1c 7.7 on 09/26/23.  Insulin  pump Dexcom Follow up with Endocrinology, Dr Beryl with [facility] Flu updated. Counseled patient to update shingles immunization through pharmacy.     Flu vaccine need Assessment & Plan: Updated today.   Orders: -     Flu vaccine HIGH DOSE PF(Fluzone Trivalent)  Encounter for screening mammogram for malignant neoplasm of breast -     3D Screening Mammogram, Left and Right; Future  History of ventricular fibrillation Assessment & Plan: In the setting of cardiac arrest in 2018, RRR today.    History of anaphylaxis Assessment & Plan: To Rocephin  in 2018 with anaphylaxis and cardiac arrest. Cautious when prescribing antibiotic.    Coronary artery disease involving native coronary artery of native heart without angina pectoris  Atrial fibrillation with rapid ventricular response [facility]) Assessment & Plan: Occurred in the setting of cardiac arrest in 2018.  RRR today, on Diltiazem  240 mg daily, continue. F/u with Dr. Mady.    Abdominal aortic aneurysm (AAA) without rupture, unspecified part [facility]) Assessment & Plan: Per Dr. Mady visit from 10/19/23: Mildly dilated abdominal aorta and right common iliac artery fairly stable on most recent duplex examination in  June, though mild growth of AAA noted  compared to initial study in 2021.  Continue aspirin  and statin therapy as well as blood pressure control.  Repeat AAA duplex around 07/2024.    Allergic rhinitis, unspecified seasonality, unspecified trigger Assessment & Plan: Trial of OTC nasal flonase 2 puffs in each nostril for 7-10 days followed by 1 puff in each nostril daily. Proper use of administration of nasal flonase done today.    Paroxysmal atrial fibrillation [facility]) Assessment & Plan: Occurred in the setting of cardiac arrest in 2018.  RRR today, on Diltiazem  240 mg daily, continue. F/u with Dr. Mady.  Due to concerns for hematoma not a candidate for anticoagulation per cardiology note.    Other orders -     Atorvastatin  Calcium ; Take 1 tablet (40 mg total) by mouth at bedtime.  Dispense: 90 tablet; Refill: 3    Return in about 6 months (around 04/17/2024) for Chronic follow up with labs couple of days before appointment .   Luke Shade, MD

## 2023-10-19 NOTE — Assessment & Plan Note (Signed)
 Chronic Recent A1c 7.7 on 09/26/23.  Insulin  pump Dexcom Follow up with Endocrinology, Dr Beryl with [facility] Flu updated. Counseled patient to update shingles immunization through pharmacy.

## 2023-10-19 NOTE — Assessment & Plan Note (Signed)
 Risk factor management, continue Aspirin  81 mg, Atorvastatin  40 mg. Continue f/u with cardiologist Dr. Mady. No angina symptoms, does get Nitroglycerine prescribed by him for prn use.

## 2023-10-19 NOTE — Progress Notes (Signed)
 Cardiology Office Note:  .   Date:  10/19/2023  ID:  Lisa [name], DOB Jun 25, 1950, [name] PCP: Lisa Bruckner, MD  [facility] Providers Cardiologist:  Lonni Hanson, MD     History of Present Illness: .   Lisa [name] is a 73 y.o. female with history of coronary artery disease status post remote coronary intervention at [facility] (the late 90s) and cardiac arrest in the setting of anaphylaxis (10/2016) with catheterization demonstrating severe 2-vessel CAD (subsequent atherectomy and PCI to RCA), isolated atrial fibrillation to anaphylaxis and cardiac arrest without evidence of recurrence, recurrent pericardial effusion, type 1 diabetes mellitus, peripheral vascular disease with AAA, right lower lobe pneumonia with mucous plugging, and spontaneous right thigh hematoma in the setting of anticoagulation with heparin , who presents for follow-up of coronary artery disease and AAA.  I last saw her in 04/2023, which time she was feeling well.  We did not make any medication changes.  Follow-up AAA ultrasound in June, showing stable mild enlargement of the abdominal aorta and right common iliac artery.  Today, Lisa [name] reports that she has continued to feel well, denying chest pain, shortness of breath, palpitations, lightheadedness, and edema.  She remains active.  She has some prominent veins in her legs but no pain when she walks.  She notes that her most recent hemoglobin A1c was a little higher than usual, though she is working with her endocrinologist to help improve this.  She is scheduled for routine labs, including CBC, CMP, and lipid panel through her PCP later today.  ROS: See HPI  Studies Reviewed: SABRA   EKG Interpretation Date/Time:  Thursday October 19 2023 08:05:55 EDT Ventricular Rate:  77 PR Interval:  142 QRS Duration:  82 QT Interval:  378 QTC Calculation: 427 R Axis:   38  Text Interpretation: Normal sinus rhythm Normal ECG When compared with ECG of 20-Apr-2023  09:01, No significant change was found Confirmed by Yahsir Wickens, Lonni [phone]) on 10/19/2023 8:09:11 AM    AAA ultrasound (07/19/2023): Stable mild enlargement of the mid abdominal aorta and right common iliac artery measuring 3.2 cm and 1.4 cm, respectively.  Risk Assessment/Calculations:    CHA2DS2-VASc Score = 5   This indicates a 7.2% annual risk of stroke. The patient's score is based upon: CHF History: 0 HTN History: 1 Diabetes History: 1 Stroke History: 0 Vascular Disease History: 1 Age Score: 1 Gender Score: 1            Physical Exam:   VS:  BP 128/64 (BP Location: Left Arm, Patient Position: Sitting, Cuff Size: Normal)   Pulse 77   Ht 5' 2 (1.575 m)   Wt 126 lb 8 oz (57.4 kg)   BMI 23.14 kg/m    Wt Readings from Last 3 Encounters:  10/19/23 126 lb 8 oz (57.4 kg)  04/20/23 131 lb 4 oz (59.5 kg)  04/17/23 130 lb 8 oz (59.2 kg)    General:  NAD. Neck: No JVD or HJR. Lungs: Clear to auscultation bilaterally without wheezes or crackles. Heart: Regular rate and rhythm without murmurs, rubs, or gallops. Abdomen: Soft, nontender, nondistended. Extremities: No lower extremity edema.  2+ posterior tibial and dorsalis pedis pulses bilaterally.  Varicose veins noted in both ankles.  ASSESSMENT AND PLAN: .    Coronary artery disease: Ms. Molter continues to do well without any chest pain.  Will plan to continue secondary prevention with aspirin  and atorvastatin .  Lipids have been well-controlled; repeat lipid panel and CMP  scheduled for later today through her PCPs office.  Paroxysmal atrial fibrillation: Only episode of atrial fibrillation occurred in the setting of cardiac arrest following anaphylaxis and severe underlying CAD.  No evidence of recurrence.  Given Fontan he is thigh hematoma on anticoagulation, we will continue to defer long-term anticoagulation.  Continue diltiazem  240 mg daily  Hypertension: Blood pressure well-controlled today.  Continue current regimen of  diltiazem  and losartan .  CMP already scheduled for later today.  AAA: Mildly dilated abdominal aorta and right common iliac artery fairly stable on most recent duplex examination in June, though mild growth of AAA noted compared to initial study in 2021.  Continue aspirin  and statin therapy as well as blood pressure control.  Repeat AAA duplex around 07/2024.  Hyperlipidemia associated with type 1 diabetes mellitus: Continue atorvastatin  40 mg daily for target LDL 70 (ideally below 55).  Panel and CMP scheduled for later today.  Ongoing management of DM per Dr. Beryl.    Dispo: Return to clinic in 1 year.  Signed, Lonni Hanson, MD

## 2023-10-19 NOTE — Assessment & Plan Note (Signed)
 Chronic throat clearing with morning cough with intermittent phlegm production.  H/O smoking, smoked for about 20 years, 1/2 pack per day and quit smoking in 2018.  Recommend PFT to r/o or establish diagnosis of COPD.  Pulmonology referral made.

## 2023-10-19 NOTE — Assessment & Plan Note (Addendum)
 To Rocephin  in 2018 with anaphylaxis and cardiac arrest. Cautious when prescribing antibiotic.

## 2023-10-19 NOTE — Assessment & Plan Note (Signed)
 Updated today.

## 2023-10-19 NOTE — Assessment & Plan Note (Signed)
 Occurred in the setting of cardiac arrest in 2018.  RRR today, on Diltiazem  240 mg daily, continue. F/u with Dr. Mady.  Due to concerns for hematoma not a candidate for anticoagulation per cardiology note.

## 2023-10-19 NOTE — Assessment & Plan Note (Signed)
 In the setting of cardiac arrest in 2018, RRR today.

## 2023-10-19 NOTE — Patient Instructions (Addendum)
 If you are interested in the shingles vaccine series (Shingrix), call your insurance or pharmacy to check on coverage and location it must be given.  If affordable - you can schedule it here or at your pharmacy depending on coverage.   YOUR MAMMOGRAM IS DUE, PLEASE CALL AND GET THIS SCHEDULED! [facility] - call [phone]    Use nasal Flonase 2 puffs in each nostril daily for 7-10 days, then one puff daily after that. If that helps with the tickling sensation you should stay on it.

## 2023-10-19 NOTE — Assessment & Plan Note (Signed)
 Trial of OTC nasal flonase 2 puffs in each nostril for 7-10 days followed by 1 puff in each nostril daily. Proper use of administration of nasal flonase done today.

## 2023-10-19 NOTE — Patient Instructions (Signed)
 Medication Instructions:  Your physician recommends that you continue on your current medications as directed. Please refer to the Current Medication list given to you today.    *If you need a refill on your cardiac medications before your next appointment, please call your pharmacy*  Lab Work: No labs ordered today    Testing/Procedures: Your physician has requested that you have an abdominal aorta duplex in June 2026. During this test, an ultrasound is used to evaluate the aorta. Allow 30 minutes for this exam. Do not eat after midnight the day before and avoid carbonated beverages. This will take place at [address] ([facility] [address], [place] [address]   Follow-Up: At [facility], you and your health needs are our priority.  As part of our continuing mission to provide you with exceptional heart care, our providers are all part of one team.  This team includes your primary Cardiologist (physician) and Advanced Practice Providers or APPs (Physician Assistants and Nurse Practitioners) who all work together to provide you with the care you need, when you need it.  Your next appointment:   1 year(s)  Provider:   You may see Lonni Hanson, MD or one of the following Advanced Practice Providers on your designated Care Team:   Lonni Meager, NP Lesley Maffucci, PA-C Bernardino Bring, PA-C Cadence [place], PA-C Tylene Lunch, NP Barnie Hila, NP

## 2023-10-19 NOTE — Assessment & Plan Note (Signed)
 Per Dr. Mady visit from 10/19/23: Mildly dilated abdominal aorta and right common iliac artery fairly stable on most recent duplex examination in June, though mild growth of AAA noted compared to initial study in 2021.  Continue aspirin  and statin therapy as well as blood pressure control.  Repeat AAA duplex around 07/2024.

## 2023-10-19 NOTE — Assessment & Plan Note (Signed)
 BP controlled and within goal.  CMP from 04/2023 normal electrolytes.  Continue Losartan  25 mg daily and Diltiazem  (240 mg) daily. Had a fib with RVR when she had cardiac arrest in 2018. Developed thigh hematoma so anticoagulation deferred per cardiology. Continue annual f/u with cardiologist.

## 2023-11-06 ENCOUNTER — Telehealth: Admitting: Physician Assistant

## 2023-11-06 DIAGNOSIS — J069 Acute upper respiratory infection, unspecified: Secondary | ICD-10-CM

## 2023-11-06 MED ORDER — BENZONATATE 100 MG PO CAPS: 100.0000 mg | ORAL_CAPSULE | Freq: Three times a day (TID) | ORAL | 0 refills | Status: DC | PRN

## 2023-11-06 NOTE — Progress Notes (Signed)
 We are sorry that you are not feeling well.  Here is how we plan to help!  Based on your presentation I believe you most likely have A cough due to a virus.  This is called viral bronchitis and is best treated by rest, plenty of fluids and control of the cough.  You may use Ibuprofen  or Tylenol  as directed to help your symptoms.     In addition you may use A non-prescription cough medication called Mucinex  DM: take 2 tablets every 12 hours. and A prescription cough medication called Tessalon  Perles 100mg . You may take 1-2 capsules every 8 hours as needed for your cough.  From your responses in the eVisit questionnaire you describe inflammation in the upper respiratory tract which is causing a significant cough.  This is commonly called Bronchitis and has four common causes:   Allergies Viral Infections Acid Reflux Bacterial Infection Allergies, viruses and acid reflux are treated by controlling symptoms or eliminating the cause. An example might be a cough caused by taking certain blood pressure medications. You stop the cough by changing the medication. Another example might be a cough caused by acid reflux. Controlling the reflux helps control the cough.  USE OF BRONCHODILATOR (RESCUE) INHALERS: There is a risk from using your bronchodilator too frequently.  The risk is that over-reliance on a medication which only relaxes the muscles surrounding the breathing tubes can reduce the effectiveness of medications prescribed to reduce swelling and congestion of the tubes themselves.  Although you feel brief relief from the bronchodilator inhaler, your asthma may actually be worsening with the tubes becoming more swollen and filled with mucus.  This can delay other crucial treatments, such as oral steroid medications. If you need to use a bronchodilator inhaler daily, several times per day, you should discuss this with your provider.  There are probably better treatments that could be used to keep your  asthma under control.     HOME CARE Only take medications as instructed by your medical team. Complete the entire course of an antibiotic. Drink plenty of fluids and get plenty of rest. Avoid close contacts especially the very young and the elderly Cover your mouth if you cough or cough into your sleeve. Always remember to wash your hands A steam or ultrasonic humidifier can help congestion.   GET HELP RIGHT AWAY IF: You develop worsening fever. You become short of breath You cough up blood. Your symptoms persist after you have completed your treatment plan MAKE SURE YOU  Understand these instructions. Will watch your condition. Will get help right away if you are not doing well or get worse.  Your e-visit answers were reviewed by a board certified advanced clinical practitioner to complete your personal care plan.  Depending on the condition, your plan could have included both over the counter or prescription medications. If there is a problem please reply  once you have received a response from your provider. Your safety is important to us .  If you have drug allergies check your prescription carefully.    You can use MyChart to ask questions about today's visit, request a non-urgent call back, or ask for a work or school excuse for 24 hours related to this e-Visit. If it has been greater than 24 hours you will need to follow up with your provider, or enter a new e-Visit to address those concerns. You will get an e-mail in the next two days asking about your experience.  I hope that your e-visit has  been valuable and will speed your recovery. Thank you for using e-visits.   I have spent 5 minutes in review of e-visit questionnaire, review and updating patient chart, medical decision making and response to patient.   Delon CHRISTELLA Dickinson, PA-C

## 2023-11-09 ENCOUNTER — Ambulatory Visit: Payer: Self-pay

## 2023-11-09 NOTE — Telephone Encounter (Signed)
 Agree with evaluation in urgent care.   Luke Shade, MD

## 2023-11-09 NOTE — Telephone Encounter (Signed)
 FYI Only or Action Required?: FYI only for provider.  Patient was last seen in primary care on 10/19/2023 by Abbey Bruckner, MD.  Called Nurse Triage reporting Shortness of Breath.  Symptoms began several days ago.  Interventions attempted: Rest, hydration, or home remedies.  Symptoms are: gradually worsening.  Triage Disposition: See HCP Within 4 Hours (Or PCP Triage)  Patient/caregiver understands and will follow disposition?: Yes  **referred to UC due to soonest appt. Availability on 10/22**       Copied from CRM #[id number]. Topic: Clinical - Red Word Triage >> Nov 09, 2023  9:14 AM Mia F wrote: Red Word that prompted transfer to Nurse Triage: Shortness of breath and congestion in the chest. No fever. Had a virtual appt this week but was given meds for cough but SOB still lingering. Had some wheezing but seem to subsided. Medication that was given did not help Reason for Disposition  [1] MILD difficulty breathing (e.g., minimal/no SOB at rest, SOB with walking, pulse < 100) AND [2] NEW-onset or WORSE than normal  Answer Assessment - Initial Assessment Questions 1. RESPIRATORY STATUS: Describe your breathing? (e.g., wheezing, shortness of breath, unable to speak, severe coughing)      SOB on exertion, chest congestion, cough. Tessalon  pearls were prescribed this Tuesday during a virtual visit, no relief   2. ONSET: When did this breathing problem begin?      Last Tuesday   3. PATTERN Does the difficult breathing come and go, or has it been constant since it started?       Intermittent   4. SEVERITY: How bad is your breathing? (e.g., mild, moderate, severe)      Mild   5. RECURRENT SYMPTOM: Have you had difficulty breathing before? If Yes, ask: When was the last time? and What happened that time?      Yes, several years ago   6. CARDIAC HISTORY: Do you have any history of heart disease? (e.g., heart attack, angina, bypass surgery, angioplasty)      Heart  disease  7. LUNG HISTORY: Do you have any history of lung disease?  (e.g., pulmonary embolus, asthma, emphysema)     No   8. CAUSE: What do you think is causing the breathing problem?      Unsure   9. OTHER SYMPTOMS: Do you have any other symptoms? (e.g., chest pain, cough, dizziness, fever, runny nose)     Cough  Was seen virtually this past Tuesday, symptoms persist, SOB with exertion is worsening. Referred to UC as there are no available appointments until 10/22. She agrees with plan of care, and will go today.  Protocols used: Breathing Difficulty-A-AH

## 2023-11-09 NOTE — Telephone Encounter (Signed)
 Spoke with patient's husband and he states they were currently at the Urgent Care.

## 2023-11-12 ENCOUNTER — Other Ambulatory Visit: Payer: Self-pay

## 2023-11-12 ENCOUNTER — Emergency Department

## 2023-11-12 ENCOUNTER — Emergency Department
Admission: EM | Admit: 2023-11-12 | Discharge: 2023-11-12 | Disposition: A | Discharge status: Home / Self Care | Attending: Emergency Medicine | Admitting: Emergency Medicine

## 2023-11-12 DIAGNOSIS — I110 Hypertensive heart disease with heart failure: Secondary | ICD-10-CM | POA: Diagnosis not present

## 2023-11-12 DIAGNOSIS — Z87442 Personal history of urinary calculi: Secondary | ICD-10-CM | POA: Insufficient documentation

## 2023-11-12 DIAGNOSIS — E109 Type 1 diabetes mellitus without complications: Secondary | ICD-10-CM | POA: Insufficient documentation

## 2023-11-12 DIAGNOSIS — I509 Heart failure, unspecified: Secondary | ICD-10-CM | POA: Insufficient documentation

## 2023-11-12 DIAGNOSIS — I2510 Atherosclerotic heart disease of native coronary artery without angina pectoris: Secondary | ICD-10-CM | POA: Diagnosis not present

## 2023-11-12 DIAGNOSIS — J209 Acute bronchitis, unspecified: Secondary | ICD-10-CM | POA: Diagnosis not present

## 2023-11-12 DIAGNOSIS — R0602 Shortness of breath: Secondary | ICD-10-CM | POA: Diagnosis present

## 2023-11-12 HISTORY — DX: Heart failure, unspecified: I50.9

## 2023-11-12 HISTORY — DX: Essential (primary) hypertension: I10

## 2023-11-12 LAB — CBC
HCT: 38.1 % (ref 36.0–46.0)
Hemoglobin: 12.5 g/dL (ref 12.0–15.0)
MCH: 27.7 pg (ref 26.0–34.0)
MCHC: 32.8 g/dL (ref 30.0–36.0)
MCV: 84.3 fL (ref 80.0–100.0)
Platelets: 299 K/uL (ref 150–400)
RBC: 4.52 MIL/uL (ref 3.87–5.11)
RDW: 13.4 % (ref 11.5–15.5)
WBC: 10.4 K/uL (ref 4.0–10.5)
nRBC: 0 % (ref 0.0–0.2)

## 2023-11-12 LAB — BASIC METABOLIC PANEL WITH GFR
Anion gap: 11 (ref 5–15)
BUN: 17 mg/dL (ref 8–23)
CO2: 25 mmol/L (ref 22–32)
Calcium: 9.2 mg/dL (ref 8.9–10.3)
Chloride: 92 mmol/L — ABNORMAL LOW (ref 98–111)
Creatinine, Ser: 0.79 mg/dL (ref 0.44–1.00)
GFR, Estimated: >60 mL/min (ref >60)
Glucose, Bld: 189 mg/dL — ABNORMAL HIGH (ref 70–99)
Potassium: 3.7 mmol/L (ref 3.5–5.1)
Sodium: 128 mmol/L — ABNORMAL LOW (ref 135–145)

## 2023-11-12 LAB — BRAIN NATRIURETIC PEPTIDE: B Natriuretic Peptide: 66.8 pg/mL (ref 0.0–100.0)

## 2023-11-12 MED ORDER — AZITHROMYCIN 250 MG PO TABS: ORAL_TABLET | ORAL | 0 refills | Status: DC

## 2023-11-12 MED ORDER — AZITHROMYCIN 500 MG PO TABS
500.0000 mg | ORAL_TABLET | ORAL | Status: AC
  Administered 2023-11-12: 500 mg via ORAL
  Filled 2023-11-12: qty 1

## 2023-11-12 MED ORDER — PREDNISONE 20 MG PO TABS
40.0000 mg | ORAL_TABLET | Freq: Every day | ORAL | 0 refills | Status: AC
Start: 2023-11-12 — End: 2023-11-16

## 2023-11-12 MED ORDER — PREDNISONE 20 MG PO TABS
40.0000 mg | ORAL_TABLET | ORAL | Status: AC
  Administered 2023-11-12: 40 mg via ORAL
  Filled 2023-11-12: qty 2

## 2023-11-12 NOTE — Discharge Instructions (Signed)
 Over the next 5 days while taking prednisone , drink plenty of fluids to stay well-hydrated. Increase your salt intake moderately during this time to facilitate good hydration as well.

## 2023-11-12 NOTE — ED Triage Notes (Signed)
 Pt to ED for pneumonia eval. States SOB since 8 days and was diagnosed with CAP 4 days ago at [facility]. States not getting any better. Ambulatory, skin dry, unlabored respirations.

## 2023-11-12 NOTE — ED Provider Notes (Signed)
 [facility] Provider Note    Event Date/Time   First MD Initiated Contact with Patient 11/12/23 1505     (approximate)   History   Chief Complaint: Shortness of Breath and Pneumonia   HPI  Lisa [name] is a 73 y.o. female with a history of paroxysmal atrial fibrillation, CHF, type 1 diabetes who comes ED complaining of mild shortness of breath and persistent nonproductive cough for the past 8 days.  Went to urgent care 4 days ago and was started on doxycycline  for community-acquired pneumonia along with Tussionex.  Symptoms are not worse but not improved since then so she comes to the ED for reassessment.  No chest pain.  No dizziness or syncope.  Eating and drinking normally.  No vomiting or diarrhea.  Reviewing urgent care evaluation, symptoms at that time were notable for body aches, rhinorrhea and nasal congestion, suggestive of viral respiratory illness        Past Medical History:  Diagnosis Date   AAA (abdominal aortic aneurysm)    Atrial fibrillation with rapid ventricular response (HCC)    CAD (coronary artery disease)    a. 1999 PTCA @ [facility]; b. NSTEMI/Cath 10/11/2016: LM 20%, oLAD 40%, p-mLAD 40%, dLAD 40%, p-mLCx 95%, pRCA 30%, 95/42m, EF 55-65%; c. 10/2016 CT Surgery->rec PCI due to poor lung fxn. d. 12/02/16 DES to mid RCA.   CHF (congestive heart failure) (HCC)    Grade II diastolic dysfunction    Groin hematoma    a. 10/2016 Spont groin hematoma w/ anemia req 1u prbcs.   High cholesterol    History of CHF (congestive heart failure) 10/19/2023   History of kidney stones    Hypertension    Kidney stone    Left ureteral stone    Mucus plugging of bronchi    a. 10/2016 RLL Bronchus obstruction->bronchus vs mass;  b. 10/2016 CT Chest: resolution of RLL mucous plug.   Non-ST elevation (NSTEMI) myocardial infarction (HCC) 10/12/2016   NSTEMI (non-ST elevated myocardial infarction) (HCC) 10/11/2016   PAF (paroxysmal atrial fibrillation) (HCC)     a. 12/2016 Noted during diag cath--CHA2DS2VASc = 3. No OAC in setting of need for DAPT and h/o spont Thigh Hematoma in 10/2016.   Paroxysmal atrial fibrillation (HCC)    Pericardial effusion    a. 10/2016 Echo: EF 60-65%, no rwma, Gr2 DD, small to mod pericardial eff. No tamponade; b. 12/2016 Echo: EF 60-65%, no rwma, Gr1 DD, 1.2 to 1.4 cm mod to large circumferential pericardial effusion w/o tamponade; c. 12/2016 Echo: EF 55-60%, no rwma, triv effusion.   Pneumonia 02/2016   couple times before 02/2016 too (12/02/2016)   Pneumonia due to infectious organism 11/25/2016   PVD (peripheral vascular disease)    a. Abdominal aortogram 10/11/16: Infrarenal abdominal aorta heavily calcified & ectatic with approximately 40-50% stenosis at the bifurcation. Mild diffuse disease w/ heavy calcification noted in the common & external iliac arteries bilaterally   Shock liver 10/13/2016   Type I diabetes mellitus (HCC)    a. On insulin  pump.(12/02/2016)   Ventricular fibrillation (HCC)    a. 10/11/2016: Felt to be secondary to demand ischemia in the setting of underlying CAD and anaphylactic reaction from IV Rocephin ; b. 10/2016 Echo: EF 55-60%, ? mild inf HK; c. 10/2016 Seen by EP: No ICD indication.    Current Outpatient Rx   Order #: [id number] Class: Normal   Order #: [id number] Class: Normal   Order #: [id number] Class: Historical Med   Order #: [id number] Class:  Normal   Order #: [id number] Class: Normal   Order #: [id number] Class: Historical Med   Order #: [id number] Class: Normal   Order #: [id number] Class: Historical Med   Order #: [id number] Class: Historical Med   Order #: [id number] Class: Normal   Order #: [id number] Class: Historical Med   Order #: [id number] Class: Normal   Order #: [id number] Class: Historical Med    Past Surgical History:  Procedure Laterality Date   ABDOMINAL AORTOGRAM N/A 10/11/2016   Procedure: ABDOMINAL AORTOGRAM;  Surgeon: Mady Bruckner, MD;  Location: [facility]AS[facility];   Service: Cardiovascular;  Laterality: N/A;   CARDIAC CATHETERIZATION  10/2016   CATARACT EXTRACTION W/PHACO Right 11/16/2022   Procedure: CATARACT EXTRACTION PHACO AND INTRAOCULAR LENS PLACEMENT (IOC) RIGHT DIABETIC 7.94 00:46.4;  Surgeon: Mittie Gaskin, MD;  Location: [facility];  Service: Ophthalmology;  Laterality: Right;   CATARACT EXTRACTION W/PHACO Left 11/30/2022   Procedure: CATARACT EXTRACTION PHACO AND INTRAOCULAR LENS PLACEMENT (IOC) LEFT DIABETIC;  Surgeon: Mittie Gaskin, MD;  Location: [facility];  Service: Ophthalmology;  Laterality: Left;  7.40 0:41.1   CERVICAL BIOPSY  W/ LOOP ELECTRODE EXCISION  03/05/2012   COLONOSCOPY     COLONOSCOPY WITH PROPOFOL  N/A 06/27/2018   Procedure: COLONOSCOPY WITH PROPOFOL ;  Surgeon: Dessa Reyes ORN, MD;  Location: [facility];  Service: Endoscopy;  Laterality: N/A;   COLONOSCOPY WITH PROPOFOL  N/A 03/24/2022   Procedure: COLONOSCOPY WITH PROPOFOL ;  Surgeon: Jinny Carmine, MD;  Location: [facility];  Service: Endoscopy;  Laterality: N/A;   COLPOSCOPY  01/17/2011   CORONARY ANGIOPLASTY WITH STENT PLACEMENT  1999; 12/02/2016   1 stent  + 1 stent   CORONARY ATHERECTOMY N/A 12/02/2016   Procedure: CORONARY ATHERECTOMY - CSI;  Surgeon: Mady Bruckner, MD;  Location: [facility];  Service: Cardiovascular;  Laterality: N/A;   CORONARY STENT INTERVENTION N/A 12/02/2016   Procedure: CORONARY STENT INTERVENTION;  Surgeon: Mady Bruckner, MD;  Location: [facility];  Service: Cardiovascular;  Laterality: N/A;   LEFT HEART CATH AND CORONARY ANGIOGRAPHY N/A 10/11/2016   Procedure: LEFT HEART CATH AND CORONARY ANGIOGRAPHY;  Surgeon: Mady Bruckner, MD;  Location: [facility];  Service: Cardiovascular;  Laterality: N/A;   NASAL SINUS SURGERY  ~ 2009   RECONSTRUCTION OF EYELID     TEMPORARY PACEMAKER N/A 12/02/2016   Procedure: TEMPORARY PACEMAKER;  Surgeon: Mady Bruckner, MD;  Location:  [facility];  Service: Cardiovascular;  Laterality: N/A;   TONSILLECTOMY      Physical Exam   Triage Vital Signs: ED Triage Vitals  Encounter Vitals Group     BP 11/12/23 1452 (!) 115/57     Girls Systolic BP Percentile --      Girls Diastolic BP Percentile --      Boys Systolic BP Percentile --      Boys Diastolic BP Percentile --      Pulse Rate 11/12/23 1452 79     Resp 11/12/23 1452 20     Temp 11/12/23 1452 98.5 F (36.9 C)     Temp Source 11/12/23 1452 Oral     SpO2 11/12/23 1452 95 %     Weight 11/12/23 1450 124 lb (56.2 kg)     Height 11/12/23 1450 5' 2 (1.575 m)     Head Circumference --      Peak Flow --      Pain Score 11/12/23 1449 0     Pain Loc --      Pain Education --  Exclude from Growth Chart --     Most recent vital signs: Vitals:   11/12/23 1452 11/12/23 1455  BP: (!) 115/57 120/65  Pulse: 79   Resp: 20   Temp: 98.5 F (36.9 C)   SpO2: 95%     General: Awake, no distress.  CV:  Good peripheral perfusion.  Regular rate rhythm Resp:  Normal effort.  Bronchial breath sounds on the right base.  No wheezing.  No focal crackles Abd:  No distention.  Soft nontender Other:  No lower extremity edema   ED Results / Procedures / Treatments   Labs (all labs ordered are listed, but only abnormal results are displayed) Labs Reviewed  BASIC METABOLIC PANEL WITH GFR - Abnormal; Notable for the following components:      Result Value   Sodium 128 (*)    Chloride 92 (*)    Glucose, Bld 189 (*)    All other components within normal limits  CBC  BRAIN NATRIURETIC PEPTIDE     EKG Interpreted by me Sinus rhythm rate of 72.  Normal axis and intervals.  Normal QRS ST segments and T waves   RADIOLOGY Chest x-ray interpreted by me, shows some mild atelectasis in the right base, no focal infiltrate.  Radiology report reviewed   PROCEDURES:  Procedures   MEDICATIONS ORDERED IN ED: Medications  azithromycin (ZITHROMAX) tablet 500 mg  (500 mg Oral Given 11/12/23 1551)  predniSONE  (DELTASONE ) tablet 40 mg (40 mg Oral Given 11/12/23 1551)     IMPRESSION / MDM / ASSESSMENT AND PLAN / ED COURSE  I reviewed the triage vital signs and the nursing notes.  DDx: Acute bronchitis, viral illness, bacterial pneumonia, anemia.  Doubt ACS PE dissection or heart failure  Patient's presentation is most consistent with acute presentation with potential threat to life or bodily function.     Clinical Course as of 11/12/23 1747  Sun Nov 12, 2023  1621 Labs unremarkable.  Presentation consistent with acute bronchitis, likely viral illness.  She is type I diabetic, will add azithromycin as well as prednisone .  She has an insulin  pump and continuous glucose monitor. [PS]    Clinical Course User Index [PS] Viviann Pastor, MD     FINAL CLINICAL IMPRESSION(S) / ED DIAGNOSES   Final diagnoses:  Acute bronchitis, unspecified organism     Rx / DC Orders   ED Discharge Orders          Ordered    azithromycin (ZITHROMAX Z-PAK) 250 MG tablet        11/12/23 1619    predniSONE  (DELTASONE ) 20 MG tablet  Daily with breakfast        11/12/23 1619             Note:  This document was prepared using Dragon voice recognition software and may include unintentional dictation errors.   Viviann Pastor, MD 11/12/23 [phone]

## 2023-11-15 ENCOUNTER — Ambulatory Visit

## 2023-11-15 ENCOUNTER — Ambulatory Visit: Payer: Self-pay

## 2023-11-15 NOTE — Telephone Encounter (Signed)
 Spoke with patient and made aware of Dr Marylynn, MD recommendations. Patient says she was informed that earlier during her telephone triage to go back to the ER or local urgent care. Patient verbalized understanding.

## 2023-11-15 NOTE — Telephone Encounter (Signed)
 FYI Only or Action Required?: FYI only for provider.  Patient was last seen in primary care on 10/19/2023 by Abbey Bruckner, MD.  Called Nurse Triage reporting Shortness of Breath.  Symptoms began several days ago.  Interventions attempted: Prescription medications: review chart.  Symptoms are: unchanged.  Triage Disposition: See HCP Within 4 Hours (Or PCP Triage)  Patient/caregiver understands and will follow disposition?: Yes               Copied from CRM #[id number]. Topic: Clinical - Red Word Triage >> Nov 15, 2023  1:08 PM Aleatha C wrote: Red Word that prompted transfer to Nurse Triage: Patient went to er Sunday for pnemonia and received medication and it is still not working, has some  shortness of breathe Reason for Disposition  [1] MILD difficulty breathing (e.g., minimal/no SOB at rest, SOB with walking, pulse < 100) AND [2] NEW-onset or WORSE than normal  Answer Assessment - Initial Assessment Questions This RN recommends the patient go to urgent care today; patient is agreeable  Patient was seen at urgent care on 10/9 and had a chest X-ray, where she was told she has pneumonia and was prescribed doxycycline . She was advised to go to the ED if not improved in three days  Patient went to the ED on 10/12 and was prescribed prednisone  and azithromycin (Zithromax Z-Pak) 250 mg, with one day of antibiotics remaining  Patient reports no improvement in symptoms-feels the same. She is not feeling well enough to complete her wellness visit today and was instructed to call nurse triage.  Current symptoms: Intermittent shortness of breath (pt states it is mild) Mild runny nose with clear drainage Denies fever, chest pain, or dizziness Attempted to check O2 saturation this morning but was unable to obtain a reading (suspects false reading due to freshly done nails)  Protocols used: Breathing Difficulty-A-AH

## 2023-11-17 ENCOUNTER — Other Ambulatory Visit: Payer: Self-pay

## 2023-11-17 ENCOUNTER — Emergency Department
Admission: EM | Admit: 2023-11-17 | Discharge: 2023-11-17 | Disposition: A | Discharge status: Home / Self Care | Attending: Emergency Medicine | Admitting: Emergency Medicine

## 2023-11-17 ENCOUNTER — Emergency Department

## 2023-11-17 DIAGNOSIS — E871 Hypo-osmolality and hyponatremia: Secondary | ICD-10-CM | POA: Diagnosis not present

## 2023-11-17 DIAGNOSIS — I482 Chronic atrial fibrillation, unspecified: Secondary | ICD-10-CM | POA: Insufficient documentation

## 2023-11-17 DIAGNOSIS — E109 Type 1 diabetes mellitus without complications: Secondary | ICD-10-CM | POA: Diagnosis not present

## 2023-11-17 DIAGNOSIS — R059 Cough, unspecified: Secondary | ICD-10-CM | POA: Insufficient documentation

## 2023-11-17 DIAGNOSIS — D72829 Elevated white blood cell count, unspecified: Secondary | ICD-10-CM | POA: Insufficient documentation

## 2023-11-17 DIAGNOSIS — R0602 Shortness of breath: Secondary | ICD-10-CM | POA: Insufficient documentation

## 2023-11-17 LAB — BASIC METABOLIC PANEL WITH GFR
Anion gap: 12 (ref 5–15)
BUN: 22 mg/dL (ref 8–23)
CO2: 27 mmol/L (ref 22–32)
Calcium: 9.5 mg/dL (ref 8.9–10.3)
Chloride: 95 mmol/L — ABNORMAL LOW (ref 98–111)
Creatinine, Ser: 0.8 mg/dL (ref 0.44–1.00)
GFR, Estimated: >60 mL/min (ref >60)
Glucose, Bld: 124 mg/dL — ABNORMAL HIGH (ref 70–99)
Potassium: 3.6 mmol/L (ref 3.5–5.1)
Sodium: 134 mmol/L — ABNORMAL LOW (ref 135–145)

## 2023-11-17 LAB — CBC
HCT: 41.1 % (ref 36.0–46.0)
Hemoglobin: 13.8 g/dL (ref 12.0–15.0)
MCH: 27.8 pg (ref 26.0–34.0)
MCHC: 33.6 g/dL (ref 30.0–36.0)
MCV: 82.7 fL (ref 80.0–100.0)
Platelets: 428 K/uL — ABNORMAL HIGH (ref 150–400)
RBC: 4.97 MIL/uL (ref 3.87–5.11)
RDW: 13.7 % (ref 11.5–15.5)
WBC: 20.5 K/uL — ABNORMAL HIGH (ref 4.0–10.5)
nRBC: 0 % (ref 0.0–0.2)

## 2023-11-17 LAB — TROPONIN I (HIGH SENSITIVITY): Troponin I (High Sensitivity): 9 ng/L (ref <18)

## 2023-11-17 LAB — D-DIMER, QUANTITATIVE: D-Dimer, Quant: 0.67 ug{FEU}/mL — ABNORMAL HIGH (ref 0.00–0.50)

## 2023-11-17 MED ORDER — IPRATROPIUM BROMIDE 0.02 % IN SOLN
0.5000 mg | Freq: Once | RESPIRATORY_TRACT | Status: AC
  Administered 2023-11-17: 0.5 mg via RESPIRATORY_TRACT
  Filled 2023-11-17: qty 2.5

## 2023-11-17 MED ORDER — LEVALBUTEROL TARTRATE 45 MCG/ACT IN AERO: 2.0000 | INHALATION_SPRAY | RESPIRATORY_TRACT | 2 refills | Status: AC | PRN

## 2023-11-17 NOTE — Discharge Instructions (Signed)
 You were seen in the emergency department today for your ongoing cough.  Your x-Rosene Pilling fortunately showed that your pneumonia that was previously seen has cleared.  I suspect you have ongoing inflammation of your airways.  I sent a prescription for an inhaler to your pharmacy to use to help with your symptoms.  Follow with your primary care doctor for further evaluation.  Return to the ER for new or worsening symptoms.

## 2023-11-17 NOTE — ED Triage Notes (Signed)
 Patient states she was recently diagnosed with Pneumonia and has completed 3 rounds of antibiotics with no relief; states she is still short of breath and having a productive cough.

## 2023-11-17 NOTE — ED Provider Notes (Signed)
 [facility] Provider Note    Event Date/Time   First MD Initiated Contact with Patient 11/17/23 [phone]     (approximate)   History   Shortness of Breath   HPI  LYNEL FORESTER is a 73 year old female with history of A-fib, type 1 diabetes presenting to the emergency department for evaluation of shortness of breath.  Patient reports that for the past several weeks she has had ongoing cough and shortness of breath.  Has been through multiple rounds of antibiotics but does not feel like she had any improvement with this.  No fevers or chest pain.  No history of asthma, but reports that she has previously used a nonalbuterol inhaler with improvement.  Cannot tolerate albuterol  as this has triggered her A-fib in the past.  Reviewed ER visit from 11/12/2023.  X-Saleha Kalp at that time with questionable paucity.  Reassuring labs.  Discharged on azithromycin and prednisone .    Physical Exam   Triage Vital Signs: ED Triage Vitals  Encounter Vitals Group     BP 11/17/23 0839 138/63     Girls Systolic BP Percentile --      Girls Diastolic BP Percentile --      Boys Systolic BP Percentile --      Boys Diastolic BP Percentile --      Pulse Rate 11/17/23 0839 75     Resp 11/17/23 0839 18     Temp 11/17/23 0839 97.9 F (36.6 C)     Temp Source 11/17/23 0839 Oral     SpO2 11/17/23 0839 93 %     Weight 11/17/23 0835 123 lb (55.8 kg)     Height 11/17/23 0835 5' 2 (1.575 m)     Head Circumference --      Peak Flow --      Pain Score 11/17/23 0835 0     Pain Loc --      Pain Education --      Exclude from Growth Chart --     Most recent vital signs: Vitals:   11/17/23 0839 11/17/23 0900  BP: 138/63 121/68  Pulse: 75 68  Resp: 18 18  Temp: 97.9 F (36.6 C)   SpO2: 93% 98%     General: Awake, interactive  CV:  Good peripheral perfusion Resp:  Unlabored respirations, lungs clear to auscultation Abd:  Nondistended.  Neuro:  Symmetric facial movement, fluid  speech   ED Results / Procedures / Treatments   Labs (all labs ordered are listed, but only abnormal results are displayed) Labs Reviewed  BASIC METABOLIC PANEL WITH GFR - Abnormal; Notable for the following components:      Result Value   Sodium 134 (*)    Chloride 95 (*)    Glucose, Bld 124 (*)    All other components within normal limits  CBC - Abnormal; Notable for the following components:   WBC 20.5 (*)    Platelets 428 (*)    All other components within normal limits  D-DIMER, QUANTITATIVE - Abnormal; Notable for the following components:   D-Dimer, Quant 0.67 (*)    All other components within normal limits  TROPONIN I (HIGH SENSITIVITY)     EKG EKG independently reviewed and interpreted by myself demonstrates:  EKG demonstrates normal sinus rhythm at rate of 69, PR 144, QRS 84, QTc 372, no acute ST changes  RADIOLOGY Imaging independently reviewed and interpreted by myself demonstrates:  CXR with near complete resolution of recent opacity without new opacities noted  Formal Radiology Read:  DG Chest 2 View Result Date: 11/17/2023 EXAM: 2 VIEW(S) XRAY OF THE CHEST 11/17/2023 09:06:00 AM COMPARISON: 5 days ago. CLINICAL HISTORY: cough. Patient states she was recently diagnosed with pneumonia and has completed 3 rounds of antibiotics with no relief; states she is still short of breath and having a productive cough. FINDINGS: LUNGS AND PLEURA: Right basilar opacity noted on prior exam is nearly resolved. Minimal left basilar subselectasis or scarring remains. No pulmonary edema. No pleural effusion. No pneumothorax. HEART AND MEDIASTINUM: No acute abnormality of the cardiac and mediastinal silhouettes. BONES AND SOFT TISSUES: No acute osseous abnormality. IMPRESSION: 1. Near-complete resolution of prior right basilar opacity. 2. Minimal left basilar atelectasis or scarring. Electronically signed by: Lynwood Seip MD 11/17/2023 09:20 AM EDT RP Workstation: [id number]     PROCEDURES:  Critical Care performed: No  Procedures   MEDICATIONS ORDERED IN ED: Medications  ipratropium (ATROVENT ) nebulizer solution 0.5 mg (0.5 mg Nebulization Given 11/17/23 0934)     IMPRESSION / MDM / ASSESSMENT AND PLAN / ED COURSE  I reviewed the triage vital signs and the nursing notes.  Differential diagnosis includes, but is not limited to, bronchitis, incomplete pneumonia treatment, reactive airway disease without formal diagnosis of asthma, lower suspicion ACS, PE  Patient's presentation is most consistent with acute presentation with potential threat to life or bodily function.  73 year old female presenting with ongoing cough and shortness of breath.  Stable vitals on presentation.  Satting in the upper 90s on room air at the time of my evaluation.  Labs with leukocytosis but in the setting of recent steroid use.  BMP overall reassuring.  D-dimer within normal limits for age-adjusted cutoff.  Negative troponin and well over 3 hours of symptoms.  Chest x-Shankar Silber demonstrates resolution of recent consolidation, suspect adequately treated pneumonia but with ongoing inflammation from her recent respiratory illness.  Has been on steroids with limited benefit.  Did trial an ipratropium only nebulizer solution here and patient did report improvement with this.  I do see that she has a intolerance of albuterol  listed but has previously tolerated Xopenex .  Patient is comfortable with discharge home with a trial of this.  She has adequate cough medicine at home.  Strict return precautions provided.  Patient discharged in stable condition.      FINAL CLINICAL IMPRESSION(S) / ED DIAGNOSES   Final diagnoses:  Shortness of breath  Cough, unspecified type     Rx / DC Orders   ED Discharge Orders          Ordered    levalbuterol  (XOPENEX  HFA) 45 MCG/ACT inhaler  Every 4 hours PRN        11/17/23 1201             Note:  This document was prepared using Dragon voice  recognition software and may include unintentional dictation errors.   Levander Slate, MD 11/17/23 [phone]

## 2023-11-20 ENCOUNTER — Ambulatory Visit

## 2023-11-20 VITALS — BP 120/78 | Ht 62.0 in | Wt 123.0 lb

## 2023-11-20 DIAGNOSIS — Z0000 Encounter for general adult medical examination without abnormal findings: Secondary | ICD-10-CM | POA: Diagnosis not present

## 2023-11-20 NOTE — Patient Instructions (Signed)
 Ms. Krabill,  Thank you for taking the time for your Medicare Wellness Visit. I appreciate your continued commitment to your health goals. Please review the care plan we discussed, and feel free to reach out if I can assist you further.  Medicare recommends these wellness visits once per year to help you and your care team stay ahead of potential health issues. These visits are designed to focus on prevention, allowing your provider to concentrate on managing your acute and chronic conditions during your regular appointments.  Please note that Annual Wellness Visits do not include a physical exam. Some assessments may be limited, especially if the visit was conducted virtually. If needed, we may recommend a separate in-person follow-up with your provider.  Ongoing Care Seeing your primary care provider every 3 to 6 months helps us  monitor your health and provide consistent, personalized care.  Remember to update your shingles and tetanus (Tdap) vaccines. Make sure that you call and schedule your mammogram.   Referrals If a referral was made during today's visit and you haven't received any updates within two weeks, please contact the referred provider directly to check on the status.  Recommended Screenings:  Health Maintenance  Topic Date Due   Hepatitis C Screening  Never done   Zoster (Shingles) Vaccine (1 of 2) 01/26/1970   Breast Cancer Screening  02/03/2017   COVID-19 Vaccine (3 - Pfizer risk series) 05/08/2019   Eye exam for diabetics  06/14/2019   DTaP/Tdap/Td vaccine (2 - Tdap) 10/10/2019   Pneumococcal Vaccine for age over 18 (2 of 2 - PCV) 04/16/2024*   Complete foot exam   12/07/2023   Hemoglobin A1C  03/28/2024   Yearly kidney health urinalysis for diabetes  04/30/2024   Yearly kidney function blood test for diabetes  11/16/2024   Medicare Annual Wellness Visit  11/19/2024   Colon Cancer Screening  03/24/2025   Flu Shot  Completed   DEXA scan (bone density measurement)   Completed   Meningitis B Vaccine  Aged Out  *Topic was postponed. The date shown is not the original due date.       11/20/2023    3:48 PM  Advanced Directives  Does Patient Have a Medical Advance Directive? Yes  Type of Estate agent of [place];Living will  Copy of Healthcare Power of Attorney in Chart? No - copy requested   Advance Care Planning is important because it: Ensures you receive medical care that aligns with your values, goals, and preferences. Provides guidance to your family and loved ones, reducing the emotional burden of decision-making during critical moments.  Vision: Annual vision screenings are recommended for early detection of glaucoma, cataracts, and diabetic retinopathy. These exams can also reveal signs of chronic conditions such as diabetes and high blood pressure.  Dental: Annual dental screenings help detect early signs of oral cancer, gum disease, and other conditions linked to overall health, including heart disease and diabetes.  Please see the attached documents for additional preventive care recommendations.

## 2023-11-20 NOTE — Progress Notes (Cosign Needed Addendum)
 Subjective:   Lisa [name] is a 73 y.o. who presents for a Medicare Wellness preventive visit.  As a reminder, Annual Wellness Visits don't include a physical exam, and some assessments may be limited, especially if this visit is performed virtually. We may recommend an in-person follow-up visit with your provider if needed.  Visit Complete: Virtual I connected with  Lisa [name] Clause on 11/20/23 by a audio enabled telemedicine application and verified that I am speaking with the correct person using two identifiers.  Patient Location: Home  Provider Location: Home Office  I discussed the limitations of evaluation and management by telemedicine. The patient expressed understanding and agreed to proceed.  Vital Signs: Because this visit was a virtual/telehealth visit, some criteria may be missing or patient reported. Any vitals not documented were not able to be obtained and vitals that have been documented are patient reported.  VideoDeclined- This patient declined Librarian, academic. Therefore the visit was completed with audio only.  Persons Participating in Visit: Patient.  AWV Questionnaire: No: Patient Medicare AWV questionnaire was not completed prior to this visit.  Cardiac Risk Factors include: advanced age (>75men, >71 women);diabetes mellitus;dyslipidemia;hypertension;Other (see comment), Risk factor comments: CAD     Objective:    Today's Vitals   11/20/23 1537  BP: 120/78  Weight: 123 lb (55.8 kg)  Height: 5' 2 (1.575 m)   Body mass index is 22.5 kg/m.     11/20/2023    3:48 PM 11/17/2023    8:37 AM 11/12/2023    2:51 PM 11/30/2022    8:57 AM 11/16/2022    7:56 AM 03/24/2022    8:57 AM 06/27/2018    7:27 AM  Advanced Directives  Does Patient Have a Medical Advance Directive? Yes Yes Yes Yes Yes Yes Yes  Type of Estate agent of [place];Living will Living will;Healthcare Power of [employer] Power of  [place];Living will Living will Living will  Healthcare Power of Attorney  Does patient want to make changes to medical advance directive?  No - Patient declined  No - Patient declined No - Patient declined    Copy of Healthcare Power of Attorney in Chart? No - copy requested No - copy requested     No - copy requested      Data saved with a previous flowsheet row definition    Current Medications (verified) Outpatient Encounter Medications as of 11/20/2023  Medication Sig   aspirin  EC 81 MG tablet Take 81 mg by mouth daily.   atorvastatin  (LIPITOR ) 40 MG tablet Take 1 tablet (40 mg total) by mouth at bedtime.   Continuous Glucose Transmitter (DEXCOM G6 TRANSMITTER) MISC by Does not apply route.   diltiazem  (CARDIZEM  CD) 240 MG 24 hr capsule TAKE 1 CAPSULE(240 MG) BY MOUTH DAILY   insulin  aspart (NOVOLOG ) cartridge Inject into the skin 3 (three) times daily with meals.   Insulin  Disposable Pump (OMNIPOD 5 G6 PODS, GEN 5,) MISC Inject into the skin.   levalbuterol  (XOPENEX  HFA) 45 MCG/ACT inhaler Inhale 2 puffs into the lungs every 4 (four) hours as needed for wheezing or shortness of breath.   losartan  (COZAAR ) 25 MG tablet Take 1 tablet (25 mg total) by mouth daily.   Multiple Vitamins-Minerals (PRESERVISION AREDS PO) Take by mouth daily.   nitroGLYCERIN  (NITROSTAT ) 0.4 MG SL tablet Place 1 tablet (0.4 mg total) under the tongue every 5 (five) minutes x 3 doses as needed for chest pain.   ONETOUCH VERIO test  strip USE TO TEST FOUR TIMES DAILY   azithromycin (ZITHROMAX Z-PAK) 250 MG tablet Take 2 tablets (500 mg) on  Day 1,  followed by 1 tablet (250 mg) once daily on Days 2 through 5. (Patient not taking: Reported on 11/20/2023)   benzonatate  (TESSALON ) 100 MG capsule Take 1 capsule (100 mg total) by mouth 3 (three) times daily as needed. (Patient not taking: Reported on 11/20/2023)   No facility-administered encounter medications on file as of 11/20/2023.    Allergies  (verified) Ceftriaxone , Penicillins, Rocephin  [ceftriaxone  sodium in dextrose ], and Albuterol    History: Past Medical History:  Diagnosis Date   AAA (abdominal aortic aneurysm)    Atrial fibrillation with rapid ventricular response (HCC)    CAD (coronary artery disease)    a. 1999 PTCA @ [facility]; b. NSTEMI/Cath 10/11/2016: LM 20%, oLAD 40%, p-mLAD 40%, dLAD 40%, p-mLCx 95%, pRCA 30%, 95/33m, EF 55-65%; c. 10/2016 CT Surgery->rec PCI due to poor lung fxn. d. 12/02/16 DES to mid RCA.   CHF (congestive heart failure) (HCC)    Grade II diastolic dysfunction    Groin hematoma    a. 10/2016 Spont groin hematoma w/ anemia req 1u prbcs.   High cholesterol    History of CHF (congestive heart failure) 10/19/2023   History of kidney stones    Hypertension    Kidney stone    Left ureteral stone    Mucus plugging of bronchi    a. 10/2016 RLL Bronchus obstruction->bronchus vs mass;  b. 10/2016 CT Chest: resolution of RLL mucous plug.   Non-ST elevation (NSTEMI) myocardial infarction (HCC) 10/12/2016   NSTEMI (non-ST elevated myocardial infarction) (HCC) 10/11/2016   PAF (paroxysmal atrial fibrillation) (HCC)    a. 12/2016 Noted during diag cath--CHA2DS2VASc = 3. No OAC in setting of need for DAPT and h/o spont Thigh Hematoma in 10/2016.   Paroxysmal atrial fibrillation (HCC)    Pericardial effusion    a. 10/2016 Echo: EF 60-65%, no rwma, Gr2 DD, small to mod pericardial eff. No tamponade; b. 12/2016 Echo: EF 60-65%, no rwma, Gr1 DD, 1.2 to 1.4 cm mod to large circumferential pericardial effusion w/o tamponade; c. 12/2016 Echo: EF 55-60%, no rwma, triv effusion.   Pneumonia 02/2016   couple times before 02/2016 too (12/02/2016)   Pneumonia due to infectious organism 11/25/2016   PVD (peripheral vascular disease)    a. Abdominal aortogram 10/11/16: Infrarenal abdominal aorta heavily calcified & ectatic with approximately 40-50% stenosis at the bifurcation. Mild diffuse disease w/ heavy calcification noted in  the common & external iliac arteries bilaterally   Shock liver 10/13/2016   Type I diabetes mellitus (HCC)    a. On insulin  pump.(12/02/2016)   Ventricular fibrillation (HCC)    a. 10/11/2016: Felt to be secondary to demand ischemia in the setting of underlying CAD and anaphylactic reaction from IV Rocephin ; b. 10/2016 Echo: EF 55-60%, ? mild inf HK; c. 10/2016 Seen by EP: No ICD indication.   Past Surgical History:  Procedure Laterality Date   ABDOMINAL AORTOGRAM N/A 10/11/2016   Procedure: ABDOMINAL AORTOGRAM;  Surgeon: Mady Bruckner, MD;  Location: [facility];  Service: Cardiovascular;  Laterality: N/A;   CARDIAC CATHETERIZATION  10/2016   CATARACT EXTRACTION W/PHACO Right 11/16/2022   Procedure: CATARACT EXTRACTION PHACO AND INTRAOCULAR LENS PLACEMENT (IOC) RIGHT DIABETIC 7.94 00:46.4;  Surgeon: Mittie Gaskin, MD;  Location: [facility];  Service: Ophthalmology;  Laterality: Right;   CATARACT EXTRACTION W/PHACO Left 11/30/2022   Procedure: CATARACT EXTRACTION PHACO AND INTRAOCULAR  LENS PLACEMENT (IOC) LEFT DIABETIC;  Surgeon: Mittie Gaskin, MD;  Location: [facility];  Service: Ophthalmology;  Laterality: Left;  7.40 0:41.1   CERVICAL BIOPSY  W/ LOOP ELECTRODE EXCISION  03/05/2012   COLONOSCOPY     COLONOSCOPY WITH PROPOFOL  N/A 06/27/2018   Procedure: COLONOSCOPY WITH PROPOFOL ;  Surgeon: Dessa Reyes ORN, MD;  Location: [facility];  Service: Endoscopy;  Laterality: N/A;   COLONOSCOPY WITH PROPOFOL  N/A 03/24/2022   Procedure: COLONOSCOPY WITH PROPOFOL ;  Surgeon: Jinny Carmine, MD;  Location: [facility];  Service: Endoscopy;  Laterality: N/A;   COLPOSCOPY  01/17/2011   CORONARY ANGIOPLASTY WITH STENT PLACEMENT  1999; 12/02/2016   1 stent  + 1 stent   CORONARY ATHERECTOMY N/A 12/02/2016   Procedure: CORONARY ATHERECTOMY - CSI;  Surgeon: Mady Bruckner, MD;  Location: [facility];  Service: Cardiovascular;  Laterality: N/A;    CORONARY STENT INTERVENTION N/A 12/02/2016   Procedure: CORONARY STENT INTERVENTION;  Surgeon: Mady Bruckner, MD;  Location: [facility];  Service: Cardiovascular;  Laterality: N/A;   LEFT HEART CATH AND CORONARY ANGIOGRAPHY N/A 10/11/2016   Procedure: LEFT HEART CATH AND CORONARY ANGIOGRAPHY;  Surgeon: Mady Bruckner, MD;  Location: [facility];  Service: Cardiovascular;  Laterality: N/A;   NASAL SINUS SURGERY  ~ 2009   RECONSTRUCTION OF EYELID     TEMPORARY PACEMAKER N/A 12/02/2016   Procedure: TEMPORARY PACEMAKER;  Surgeon: Mady Bruckner, MD;  Location: [facility];  Service: Cardiovascular;  Laterality: N/A;   TONSILLECTOMY     Family History  Problem Relation Age of Onset   Hypertension Mother    Aortic aneurysm Father    Hypertension Brother    Lung disease Neg Hx    Rheumatologic disease Neg Hx    Social History   Socioeconomic History   Marital status: Married    Spouse name: Not on file   Number of children: Not on file   Years of education: Not on file   Highest education level: 12th grade  Occupational History   Not on file  Tobacco Use   Smoking status: Some Days    Current packs/day: 0.00    Average packs/day: 0.2 packs/day for 47.7 years (11.9 ttl pk-yrs)    Types: Cigarettes    Start date: 01/25/1969    Last attempt to quit: 10/11/2016    Years since quitting: 7.1   Smokeless tobacco: Never  Vaping Use   Vaping status: Never Used  Substance and Sexual Activity   Alcohol use: No    Comment: 1 glass of wine per night   Drug use: No   Sexual activity: Yes    Birth control/protection: None  Other Topics Concern   Not on file  Social History Narrative   [facility] (10/14/16):   Patient has primarily done office work. Married for approximately 1 year. Has a dog at home but no bird exposure. No mold exposure.   Social Drivers of Corporate investment banker Strain: Low Risk  (11/20/2023)   Overall Financial Resource  Strain (CARDIA)    Difficulty of Paying Living Expenses: Not hard at all  Food Insecurity: No Food Insecurity (11/20/2023)   Hunger Vital Sign    Worried About Running Out of Food in the Last Year: Never true    Ran Out of Food in the Last Year: Never true  Transportation Needs: No Transportation Needs (11/20/2023)   PRAPARE - Administrator, Civil Service (Medical): No  Lack of Transportation (Non-Medical): No  Physical Activity: Sufficiently Active (11/20/2023)   Exercise Vital Sign    Days of Exercise per Week: 3 days    Minutes of Exercise per Session: 60 min  Stress: No Stress Concern Present (11/20/2023)   [employer] of Occupational Health - Occupational Stress Questionnaire    Feeling of Stress: Only a little  Social Connections: Socially Integrated (11/20/2023)   Social Connection and Isolation Panel    Frequency of Communication with Friends and Family: More than three times a week    Frequency of Social Gatherings with Friends and Family: Three times a week    Attends Religious Services: More than 4 times per year    Active Member of Clubs or Organizations: Yes    Attends Engineer, structural: More than 4 times per year    Marital Status: Married    Tobacco Counseling Ready to quit: Not Answered Counseling given: Not Answered    Clinical Intake:  Pre-visit preparation completed: Yes  Pain : No/denies pain     BMI - recorded: 22.5 Nutritional Status: BMI of 19-24  Normal Nutritional Risks: None Diabetes: Yes CBG done?: Yes (on a pump)  Lab Results  Component Value Date   HGBA1C 7.7 03/14/2023   HGBA1C 8.0 09/08/2022   HGBA1C 7.0 (H) 10/15/2016     How often do you need to have someone help you when you read instructions, pamphlets, or other written materials from your doctor or pharmacy?: 1 - Never  Interpreter Needed?: No  Information entered by :: R. Daisean Brodhead LPN   Activities of Daily Living     11/20/2023    3:39 PM  11/30/2022    8:52 AM  In your present state of health, do you have any difficulty performing the following activities:  Hearing? 0 0  Vision? 0 1  Difficulty concentrating or making decisions? 0 0  Walking or climbing stairs? 0   Dressing or bathing? 0   Doing errands, shopping? 0   Preparing Food and eating ? N   Using the Toilet? N   In the past six months, have you accidently leaked urine? N   Do you have problems with loss of bowel control? N   Managing your Medications? N   Managing your Finances? N   Housekeeping or managing your Housekeeping? N     Patient Care Team: Bair, Kalpana, MD as PCP - General (Family Medicine) End, Lonni, MD as PCP - Cardiology (Cardiology) Beryl Donnice BRAVO, MD as Referring Physician (Endocrinology)  I have updated your Care Teams any recent Medical Services you may have received from other providers in the past year.     Assessment:   This is a routine wellness examination for Lisa [name].  Hearing/Vision screen Hearing Screening - Comments:: No issues Vision Screening - Comments:: glasses   Goals Addressed             This Visit's Progress    Patient Stated       Wants to be more active than she already is       Depression Screen     11/20/2023    3:44 PM 10/19/2023    9:55 AM 04/17/2023    9:48 AM 10/07/2022   10:41 AM 12/16/2016    2:13 PM  PHQ 2/9 Scores  PHQ - 2 Score 0 0 0 0 1  PHQ- 9 Score 2 0 0 0 6    Fall Risk     11/20/2023  3:40 PM 10/19/2023    9:55 AM 04/17/2023    9:47 AM 10/07/2022   10:41 AM 07/05/2018    9:01 AM  Fall Risk   Falls in the past year? 0 0 0 0 0   Number falls in past yr: 0 0 0 0   Injury with Fall? 0 0 0 0   Risk for fall due to : No Fall Risks No Fall Risks No Fall Risks No Fall Risks   Follow up Falls evaluation completed;Falls prevention discussed Falls evaluation completed Falls evaluation completed;Education provided Falls evaluation completed      Data saved with a previous  flowsheet row definition    MEDICARE RISK AT HOME:  Medicare Risk at Home Any stairs in or around the home?: No If so, are there any without handrails?: No Home free of loose throw rugs in walkways, pet beds, electrical cords, etc?: Yes Adequate lighting in your home to reduce risk of falls?: Yes Life alert?: No Use of a cane, walker or w/c?: No Grab bars in the bathroom?: Yes Shower chair or bench in shower?: No Elevated toilet seat or a handicapped toilet?: No  TIMED UP AND GO:  Was the test performed?  No  Cognitive Function: 6CIT completed        11/20/2023    3:48 PM  6CIT Screen  What Year? 0 points  What month? 0 points  What time? 0 points  Count back from 20 0 points  Months in reverse 0 points  Repeat phrase 0 points  Total Score 0 points    Immunizations Immunization History  Administered Date(s) Administered   Fluad Trivalent(High Dose 65+) 10/07/2022   INFLUENZA, HIGH DOSE SEASONAL PF 10/19/2023   PFIZER(Purple Top)SARS-COV-2 Vaccination 03/17/2019, 04/10/2019   Pneumococcal Polysaccharide-23 04/21/2008, 08/15/2013   Td 10/09/2009   Zoster, Live 11/29/2010    Screening Tests Health Maintenance  Topic Date Due   Medicare Annual Wellness (AWV)  Never done   Hepatitis C Screening  Never done   Zoster Vaccines- Shingrix (1 of 2) 01/26/1971   Mammogram  02/03/2017   COVID-19 Vaccine (3 - Pfizer risk series) 05/08/2019   OPHTHALMOLOGY EXAM  06/14/2019   DTaP/Tdap/Td (2 - Tdap) 10/10/2019   HEMOGLOBIN A1C  09/11/2023   Pneumococcal Vaccine: 50+ Years (2 of 2 - PCV) 04/16/2024 (Originally 08/16/2014)   FOOT EXAM  12/07/2023   Diabetic kidney evaluation - Urine ACR  04/30/2024   Diabetic kidney evaluation - eGFR measurement  11/16/2024   Colonoscopy  03/24/2025   Influenza Vaccine  Completed   DEXA SCAN  Completed   Meningococcal B Vaccine  Aged Out    Health Maintenance Items Addressed: Discussed the need to update shingles and tetanus (Tdap)  vaccines. Patient stated that she is aware that mammogram order has been placed and will call and schedule it. Patient declines covid vaccine.  Additional Screening:  Vision Screening: Recommended annual ophthalmology exams for early detection of glaucoma and other disorders of the eye. Is the patient up to date with their annual eye exam?  Yes  Who is the provider or what is the name of the office in which the patient attends annual eye exams?  [facility]   Will send for last office notes.  Dental Screening: Recommended annual dental exams for proper oral hygiene  Community Resource Referral / Chronic Care Management: CRR required this visit?  No   CCM required this visit?  No   Plan:    I have personally  reviewed and noted the following in the patient's chart:   Medical and social history Use of alcohol, tobacco or illicit drugs  Current medications and supplements including opioid prescriptions. Patient is not currently taking opioid prescriptions. Functional ability and status Nutritional status Physical activity Advanced directives List of other physicians Hospitalizations, surgeries, and ER visits in previous 12 months Vitals Screenings to include cognitive, depression, and falls Referrals and appointments  In addition, I have reviewed and discussed with patient certain preventive protocols, quality metrics, and best practice recommendations. A written personalized care plan for preventive services as well as general preventive health recommendations were provided to patient.   Angeline Fredericks, LPN   [id number]   After Visit Summary: (MyChart) Due to this being a telephonic visit, the after visit summary with patients personalized plan was offered to patient via MyChart   Notes: Nothing significant to report at this time. Patient needs Hepatitis C screening with next labs. Patient stated that her PCP was Dr. Christi  and she did an AWV every year with his office.

## 2023-11-24 ENCOUNTER — Ambulatory Visit: Admitting: Internal Medicine

## 2023-12-01 ENCOUNTER — Encounter: Payer: Self-pay | Admitting: Pulmonary Disease

## 2023-12-01 ENCOUNTER — Ambulatory Visit: Admitting: Pulmonary Disease

## 2023-12-01 VITALS — BP 120/64 | HR 78 | Temp 97.7°F | Ht 62.0 in | Wt 127.0 lb

## 2023-12-01 DIAGNOSIS — R0982 Postnasal drip: Secondary | ICD-10-CM

## 2023-12-01 DIAGNOSIS — R053 Chronic cough: Secondary | ICD-10-CM | POA: Diagnosis not present

## 2023-12-01 DIAGNOSIS — R0602 Shortness of breath: Secondary | ICD-10-CM | POA: Diagnosis not present

## 2023-12-01 DIAGNOSIS — Z8701 Personal history of pneumonia (recurrent): Secondary | ICD-10-CM

## 2023-12-01 DIAGNOSIS — R052 Subacute cough: Secondary | ICD-10-CM

## 2023-12-01 DIAGNOSIS — Z87891 Personal history of nicotine dependence: Secondary | ICD-10-CM

## 2023-12-01 NOTE — Progress Notes (Signed)
 Subjective:    Patient ID: Lisa [name], female    DOB: 08-13-1950, 73 y.o.   MRN: [id number]  Patient Care Team: Bair, Kalpana, MD as PCP - General (Family Medicine) End, Lonni, MD as PCP - Cardiology (Cardiology) Beryl Donnice BRAVO, MD as Referring Physician (Endocrinology) Pa, [facility])  Chief Complaint  Patient presents with   Consult    Cough on and off, mostly due to drainage.  Cough has resolved.     BACKGROUND: Patient is a 73 year old female who presents for evaluation of recent cough in the setting of pneumonia.  Patient states that her cough has resolved.  Previously she had been evaluated by Dr. Laurance Flor.  This was in 2018.  She is now kindly referred by Dr. Luke Shade.  HPI Discussed the use of AI scribe software for clinical note transcription with the patient, who gave verbal consent to proceed.  History of Present Illness   Lisa [name] is a 73 year old female who presents with a cough and recent pneumonia. She was referred by Dr. Kalpana Bair for evaluation of her cough and postnasal drainage issues.  She was experiencing a cough, particularly at night, accompanied by significant drainage that often woke her up. She has had a couple of emergency room visits due to difficulty breathing.  She was noted to have pneumonia and was treated appropriately with antibiotics.  The symptoms have now subsided since that treatment.  She has not had any issues with cough or shortness of breath outside of her issues with her recent pneumonia.  Pneumonia was diagnosed on 9 October.  It appeared to have followed a viral syndrome.  She does not endorse any current cough, sputum production, hemoptysis, chest pain, shortness of breath, orthopnea or paroxysmal nocturnal dyspnea.  No lower extremity edema.  No fevers or chills.  She has a history of smoking but quit following a significant health event in 2018. She has received her flu shot and  follows up regularly with her cardiologist and primary care doctor.     Her last PFTs of record were in 2018 and these were very poor due to the patient having had significant chest injury after CPR for a cardiac arrest from MI.  She has not had reevaluation of her lung function since.  The patient does not endorse any other symptomatology.  Overall she feels well and looks well.   Review of Systems A 10 point review of systems was performed and it is as noted above otherwise negative.   Past Medical History:  Diagnosis Date   AAA (abdominal aortic aneurysm)    Atrial fibrillation with rapid ventricular response (HCC)    CAD (coronary artery disease)    a. 1999 PTCA @ [facility]; b. NSTEMI/Cath 10/11/2016: LM 20%, oLAD 40%, p-mLAD 40%, dLAD 40%, p-mLCx 95%, pRCA 30%, 95/34m, EF 55-65%; c. 10/2016 CT Surgery->rec PCI due to poor lung fxn. d. 12/02/16 DES to mid RCA.   CHF (congestive heart failure) (HCC)    Grade II diastolic dysfunction    Groin hematoma    a. 10/2016 Spont groin hematoma w/ anemia req 1u prbcs.   High cholesterol    History of CHF (congestive heart failure) 10/19/2023   History of kidney stones    Hypertension    Kidney stone    Left ureteral stone    Mucus plugging of bronchi    a. 10/2016 RLL Bronchus obstruction->bronchus vs mass;  b. 10/2016 CT Chest: resolution of  RLL mucous plug.   Non-ST elevation (NSTEMI) myocardial infarction (HCC) 10/12/2016   NSTEMI (non-ST elevated myocardial infarction) (HCC) 10/11/2016   PAF (paroxysmal atrial fibrillation) (HCC)    a. 12/2016 Noted during diag cath--CHA2DS2VASc = 3. No OAC in setting of need for DAPT and h/o spont Thigh Hematoma in 10/2016.   Paroxysmal atrial fibrillation (HCC)    Pericardial effusion    a. 10/2016 Echo: EF 60-65%, no rwma, Gr2 DD, small to mod pericardial eff. No tamponade; b. 12/2016 Echo: EF 60-65%, no rwma, Gr1 DD, 1.2 to 1.4 cm mod to large circumferential pericardial effusion w/o tamponade; c. 12/2016  Echo: EF 55-60%, no rwma, triv effusion.   Pneumonia 02/2016   couple times before 02/2016 too (12/02/2016)   Pneumonia due to infectious organism 11/25/2016   PVD (peripheral vascular disease)    a. Abdominal aortogram 10/11/16: Infrarenal abdominal aorta heavily calcified & ectatic with approximately 40-50% stenosis at the bifurcation. Mild diffuse disease w/ heavy calcification noted in the common & external iliac arteries bilaterally   Shock liver 10/13/2016   Type I diabetes mellitus (HCC)    a. On insulin  pump.(12/02/2016)   Ventricular fibrillation (HCC)    a. 10/11/2016: Felt to be secondary to demand ischemia in the setting of underlying CAD and anaphylactic reaction from IV Rocephin ; b. 10/2016 Echo: EF 55-60%, ? mild inf HK; c. 10/2016 Seen by EP: No ICD indication.    Past Surgical History:  Procedure Laterality Date   ABDOMINAL AORTOGRAM N/A 10/11/2016   Procedure: ABDOMINAL AORTOGRAM;  Surgeon: Mady Bruckner, MD;  Location: [facility];  Service: Cardiovascular;  Laterality: N/A;   CARDIAC CATHETERIZATION  10/2016   CATARACT EXTRACTION W/PHACO Right 11/16/2022   Procedure: CATARACT EXTRACTION PHACO AND INTRAOCULAR LENS PLACEMENT (IOC) RIGHT DIABETIC 7.94 00:46.4;  Surgeon: Mittie Gaskin, MD;  Location: [facility];  Service: Ophthalmology;  Laterality: Right;   CATARACT EXTRACTION W/PHACO Left 11/30/2022   Procedure: CATARACT EXTRACTION PHACO AND INTRAOCULAR LENS PLACEMENT (IOC) LEFT DIABETIC;  Surgeon: Mittie Gaskin, MD;  Location: [facility];  Service: Ophthalmology;  Laterality: Left;  7.40 0:41.1   CERVICAL BIOPSY  W/ LOOP ELECTRODE EXCISION  03/05/2012   COLONOSCOPY     COLONOSCOPY WITH PROPOFOL  N/A 06/27/2018   Procedure: COLONOSCOPY WITH PROPOFOL ;  Surgeon: Dessa Reyes ORN, MD;  Location: [facility];  Service: Endoscopy;  Laterality: N/A;   COLONOSCOPY WITH PROPOFOL  N/A 03/24/2022   Procedure: COLONOSCOPY WITH PROPOFOL ;   Surgeon: Jinny Carmine, MD;  Location: [facility];  Service: Endoscopy;  Laterality: N/A;   COLPOSCOPY  01/17/2011   CORONARY ANGIOPLASTY WITH STENT PLACEMENT  1999; 12/02/2016   1 stent  + 1 stent   CORONARY ATHERECTOMY N/A 12/02/2016   Procedure: CORONARY ATHERECTOMY - CSI;  Surgeon: Mady Bruckner, MD;  Location: [facility];  Service: Cardiovascular;  Laterality: N/A;   CORONARY STENT INTERVENTION N/A 12/02/2016   Procedure: CORONARY STENT INTERVENTION;  Surgeon: Mady Bruckner, MD;  Location: [facility];  Service: Cardiovascular;  Laterality: N/A;   LEFT HEART CATH AND CORONARY ANGIOGRAPHY N/A 10/11/2016   Procedure: LEFT HEART CATH AND CORONARY ANGIOGRAPHY;  Surgeon: Mady Bruckner, MD;  Location: [facility];  Service: Cardiovascular;  Laterality: N/A;   NASAL SINUS SURGERY  ~ 2009   RECONSTRUCTION OF EYELID     TEMPORARY PACEMAKER N/A 12/02/2016   Procedure: TEMPORARY PACEMAKER;  Surgeon: Mady Bruckner, MD;  Location: [facility];  Service: Cardiovascular;  Laterality: N/A;  TONSILLECTOMY      Patient Active Problem List   Diagnosis Date Noted   Cough in adult 10/19/2023   Flu vaccine need 10/19/2023   Allergic rhinitis 10/19/2023   Essential hypertension 10/26/2022   Osteoporosis 10/23/2022   Mood disorder 10/23/2022   Encounter for screening mammogram for malignant neoplasm of breast 10/23/2022   History of colonic polyps 03/24/2022   Polyp of ascending colon 03/24/2022   Aortic atherosclerosis 04/14/2021   AAA (abdominal aortic aneurysm) without rupture 04/23/2020   Rectal polyp 07/06/2018   Type 1 diabetes mellitus with complications (HCC) 09/21/2017   Paroxysmal atrial fibrillation (HCC)    Peripheral vascular disease 11/25/2016   Hyperlipidemia due to type 1 diabetes mellitus (HCC) 11/25/2016   Coronary artery disease    Renal stone 10/12/2016   History of anaphylaxis 10/12/2016    Family History  Problem Relation Age  of Onset   Hypertension Mother    Aortic aneurysm Father    Hypertension Brother    Lung disease Neg Hx    Rheumatologic disease Neg Hx     Social History   Tobacco Use   Smoking status: Former    Current packs/day: 0.00    Average packs/day: 0.2 packs/day for 47.7 years (11.9 ttl pk-yrs)    Types: Cigarettes    Start date: 01/25/1969    Quit date: 10/11/2016    Years since quitting: 7.1   Smokeless tobacco: Never  Substance Use Topics   Alcohol use: No    Comment: 1 glass of wine per night    Allergies  Allergen Reactions   Ceftriaxone  Other (See Comments)    Anaphylaxis, cardiac arrest   Penicillins Anaphylaxis   Rocephin  [Ceftriaxone  Sodium In Dextrose ] Anaphylaxis   Albuterol  Other (See Comments)    Tachycardia-->Afib. Tolerates Xopenex     Current Meds  Medication Sig   aspirin  EC 81 MG tablet Take 81 mg by mouth daily.   atorvastatin  (LIPITOR ) 40 MG tablet Take 1 tablet (40 mg total) by mouth at bedtime.   Continuous Glucose Transmitter (DEXCOM G6 TRANSMITTER) MISC by Does not apply route.   diltiazem  (CARDIZEM  CD) 240 MG 24 hr capsule TAKE 1 CAPSULE(240 MG) BY MOUTH DAILY   insulin  aspart (NOVOLOG ) cartridge Inject into the skin 3 (three) times daily with meals.   Insulin  Disposable Pump (OMNIPOD 5 G6 PODS, GEN 5,) MISC Inject into the skin.   levalbuterol  (XOPENEX  HFA) 45 MCG/ACT inhaler Inhale 2 puffs into the lungs every 4 (four) hours as needed for wheezing or shortness of breath.   losartan  (COZAAR ) 25 MG tablet Take 1 tablet (25 mg total) by mouth daily.   Multiple Vitamins-Minerals (PRESERVISION AREDS PO) Take by mouth daily.   nitroGLYCERIN  (NITROSTAT ) 0.4 MG SL tablet Place 1 tablet (0.4 mg total) under the tongue every 5 (five) minutes x 3 doses as needed for chest pain.   ONETOUCH VERIO test strip USE TO TEST FOUR TIMES DAILY    Immunization History  Administered Date(s) Administered   Fluad Trivalent(High Dose 65+) 10/07/2022   INFLUENZA, HIGH DOSE  SEASONAL PF 10/19/2023   PFIZER(Purple Top)SARS-COV-2 Vaccination 03/17/2019, 04/10/2019   Pneumococcal Polysaccharide-23 04/21/2008, 08/15/2013   Td 10/09/2009   Zoster, Live 11/29/2010        Objective:     BP 120/64   Pulse 78   Temp 97.7 F (36.5 C) (Temporal)   Ht 5' 2 (1.575 m)   Wt 127 lb (57.6 kg)   SpO2 99%   BMI 23.23 kg/m  SpO2: 99 %  GENERAL: Well-developed, well-nourished woman, impeccably groomed.  No rotational dyspnea.  Fully ambulatory. HEAD: Normocephalic, atraumatic.  EYES: Pupils equal, round, reactive to light.  No scleral icterus.  MOUTH: Dentition intact, oral mucosa moist.  No thrush. NECK: Supple. No thyromegaly. Trachea midline. No JVD.  No adenopathy. PULMONARY: Good air entry bilaterally.  No adventitious sounds. CARDIOVASCULAR: S1 and S2. Regular rate and rhythm.  Cards normal. ABDOMEN: Benign. MUSCULOSKELETAL: No joint deformity, no clubbing, no edema.  NEUROLOGIC: No overt focal deficit, no gait disturbance, speech is fluent.  SKIN: Intact,warm,dry. PSYCH: Mood and behavior normal.   Chest x-ray from 17 November 2023 showing improvement on pneumonia previously seen, only minimal residual noted:       Assessment & Plan:     ICD-10-CM   1. Shortness of breath  R06.02 Pulmonary function test    2. Subacute cough  R05.2 Pulmonary function test    3. Post-nasal drip  R09.82     4. History of recent pneumonia  Z87.01     5. Former smoker  Z87.891       Orders Placed This Encounter  Procedures   Pulmonary function test    Standing Status:   Future    Expected Date:   12/15/2023    Expiration Date:   11/30/2024    Where should this test be performed?:   Outpatient Pulmonary    What type of PFT is being ordered?:   Full PFT   Discussion:    Cough with postnasal drainage -overall symptoms improved/resolved Chronic cough with postnasal drainage, particularly nocturnal, leading to coughing upon waking. No current dyspnea except  during recent pneumonia episode. - Recommend nasal hygiene - Cough has resolved after antibiotic therapy - Will obtain baseline pulmonary function  Recent pneumonia, resolved Recent pneumonia episode resolved with improved chest x-ray findings.  Former smoker Patient quit in 2018 after major event of cardiac arrest in the setting of MI. No current smoking.      Advised if symptoms do not improve or worsen, to please contact office for sooner follow up or seek emergency care.    I spent 45 minutes of dedicated to the care of this patient on the date of this encounter to include pre-visit review of records, face-to-face time with the patient discussing conditions above, post visit ordering of testing, clinical documentation with the electronic health record, making appropriate referrals as documented, and communicating necessary findings to members of the patients care team.   C. Leita Sanders, MD Advanced Bronchoscopy P[facility]    *This note was dictated using voice recognition software/Dragon.  Despite best efforts to proofread, errors can occur which can change the meaning. Any transcriptional errors that result from this process are unintentional and may not be fully corrected at the time of dictation.

## 2023-12-01 NOTE — Patient Instructions (Signed)
 VISIT SUMMARY:  Today, we discussed your cyclical cough and recent pneumonia. You mentioned that your cough is particularly bothersome at night and is accompanied by significant drainage. We also reviewed your history of smoking and your recent efforts to quit.  YOUR PLAN:  -COUGH WITH POSTNASAL DRAINAGE: Your chronic cough is likely due to postnasal drainage, which means mucus from your nose is draining down your throat, causing irritation and coughing, especially at night. We checked the level of inflammation in your airway to better understand the cause and will continue to monitor your symptoms.  -RECENT PNEUMONIA, RESOLVED: Your recent pneumonia has resolved, as confirmed by improved chest x-ray findings. Pneumonia is an infection that inflames the air sacs in one or both lungs, which can fill with fluid or pus.  -PERSONAL HISTORY OF NICOTINE DEPENDENCE: You have a history of nicotine dependence but have successfully quit smoking for the past 28 days. Nicotine dependence means you were addicted to the nicotine in cigarettes, but quitting is a significant step towards better health.  INSTRUCTIONS:  Please continue to monitor your symptoms and follow up with your primary care doctor and cardiologist as scheduled. If your cough worsens or you experience any new symptoms, please contact our office.

## 2023-12-12 ENCOUNTER — Ambulatory Visit

## 2023-12-12 DIAGNOSIS — R0602 Shortness of breath: Secondary | ICD-10-CM

## 2023-12-12 DIAGNOSIS — R052 Subacute cough: Secondary | ICD-10-CM

## 2023-12-12 LAB — PULMONARY FUNCTION TEST
DL/VA % pred: 90 %
DL/VA: 3.82 ml/min/mmHg/L
DLCO unc % pred: 88 %
DLCO unc: 15.79 ml/min/mmHg
FEF 25-75 Post: 2.46 L/s
FEF 25-75 Pre: 2.2 L/s
FEF2575-%Change-Post: 11 %
FEF2575-%Pred-Post: 141 %
FEF2575-%Pred-Pre: 126 %
FEV1-%Change-Post: 2 %
FEV1-%Pred-Post: 105 %
FEV1-%Pred-Pre: 102 %
FEV1-Post: 2.17 L
FEV1-Pre: 2.11 L
FEV1FVC-%Change-Post: 1 %
FEV1FVC-%Pred-Pre: 107 %
FEV6-%Change-Post: 1 %
FEV6-%Pred-Post: 101 %
FEV6-%Pred-Pre: 100 %
FEV6-Post: 2.63 L
FEV6-Pre: 2.6 L
FEV6FVC-%Pred-Post: 104 %
FEV6FVC-%Pred-Pre: 104 %
FVC-%Change-Post: 1 %
FVC-%Pred-Post: 97 %
FVC-%Pred-Pre: 95 %
FVC-Post: 2.64 L
FVC-Pre: 2.6 L
Post FEV1/FVC ratio: 82 %
Post FEV6/FVC ratio: 100 %
Pre FEV1/FVC ratio: 81 %
Pre FEV6/FVC Ratio: 100 %
RV % pred: 99 %
RV: 2.09 L
TLC % pred: 99 %
TLC: 4.74 L

## 2023-12-12 NOTE — Progress Notes (Signed)
 Full PFT completed today ? ?

## 2023-12-12 NOTE — Patient Instructions (Signed)
 Full PFT completed today ? ?

## 2023-12-19 ENCOUNTER — Ambulatory Visit: Payer: Self-pay | Admitting: Pulmonary Disease

## 2024-01-03 NOTE — Progress Notes (Signed)
 History of Present Illness This is a 73 y.o. female who returns for follow-up of type 1 diabetes.  Lisa [name] had bronchitis/pneumonia in October and was put on steroids and antibiotics.  I had her adjust her Omnipod 5 pump's correction factor from 50 to 40 to give more correction insulin  to match increased requirements while sick and on steroids.  However, she didn't adjusted her correction factor when she felt better.  Otherwise she has been doing well with no other health issues.  Her last 14 day average glucose on her Dexcom G7 CGM is 127 and time in target range (glucose 70-180) is 94%.  Blood sugars are low (54-69) only 1% of the time and she denies major hypoglycemic episodes.  Fasting blood sugars are stable between 100 and 120 and post-meal glucoses rarely go above 200 and come down quickly.  Her CGM's estimated A1C (GMI) is 6.4%.  POCT A1C today is 7.5%.      Past Medical History[1] Past Surgical History[2] Allergies[3] Medications Ordered Prior to Encounter[4] Family History[5] Social History[6]  Review of Systems Eight systems were reviewed; pertinent positives and negatives are as mentioned in the HPI.    Physical Examination Vitals:   01/03/24 0902  BP: 126/64  Weight: 126 lb (57.2 kg)  Height: 5' 2.5 (1.588 m)   Body mass index is 22.68 kg/m. GENERAL:  Pleasant, well-appearing female in no distress. HEENT: Pupils equal, round and reactive to light. Extraocular movements intact. Nondilated fundoscopic exam reveals no signs of retinopathy. Mucous membranes moist with no oropharyngeal lesions. NECK: Supple with no lymphadenopathy or thyromegaly. CARDIOVASCULAR: Normal rate and regular rhythm with no murmurs, rubs or gallops; 2+ distal pulses. EXTREMITIES: Warm and well-perfused.  No clubbing, cyanosis or edema. NEUROLOGIC: Alert and oriented x3. Gait and station normal. SKIN: Warm and dry, with no rashes. No acanthosis nigricans. Diabetic foot exam:  Left: Monofilament  test: Sensation normal  Pulses: normal and present  Skin: Normal and no erythema, no cyanosis or pallor   Other findings: none Right: Monofilament test: Sensation normal  Pulses: normal and present  Skin: Normal and no erythema, no cyanosis or pallor   Other findings: none Exam performed with shoes and socks removed.   Recent Results (from the past week)  POCT A1C   Collection Time: 01/03/24  9:05 AM  Result Value Ref Range   Hemoglobin A1c 7.5 (A) 4.8 - 5.6 %    A/P:   1.  Type 1 Diabetes:  POCT A1C 7.5%.  Lisa [name] had bronchitis/pneumonia in October and was put on steroids and antibiotics.  I had her adjust her Omnipod 5 pump's correction factor from 50 to 40 to give more correction insulin  to match increased requirements while sick and on steroids.  However, she didn't adjusted her correction factor when she felt better.  Otherwise she has been doing well with no other health issues.  Her last 14 day average glucose on her Dexcom G7 CGM is 127 and time in target range (glucose 70-180) is 94%.  Blood sugars are low (54-69) only 1% of the time and she denies major hypoglycemic episodes.  Fasting blood sugars are stable between 100 and 120 and post-meal glucoses rarely go above 200 and come down quickly.  Her CGM's estimated A1C (GMI) is 6.4%.  Lisa [name]'s overall glycemic control is excellent.  I'm uncertain why her Hgb A1C levels are about 1% higher than her CGM readings would suggest.  I did lower her Omnipod 5 pump's manual mode basal rates by  0.1 units/hour to better match her Automated mode requirements.  Other pump settings will remain the same.  Baseline labs will be checked by her PCP before her next visit.  2.  Hyperlipidemia:  Continue Atorvastatin  40mg  daily.  Lipids will be checked with next routine blood work.     Donnice FORBES Lipps, MD 01/03/2024, 9:24 AM         [1] No past medical history on file. [2] No past surgical history on file. [3] Allergies Allergen Reactions    Ceftriaxone  Anaphylaxis    Anaphylaxis, cardiac arrest   Penicillins Anaphylaxis   Albuterol  Other    Tachycardia-->Afib. Tolerates Xopenex   [4] Current Outpatient Medications on File Prior to Visit  Medication Sig Dispense Refill   aspirin  (ECOTRIN LOW DOSE) EC tablet Take one tablet (81 mg dose) by mouth daily.     atorvastatin  (LIPITOR ) 40 mg tablet Take one tablet (40 mg dose) by mouth at bedtime.     Continuous Glucose Sensor (DEXCOM G7 SENSOR) MISC 1 each by Does not apply route every 10 days. 9 each 3   DILTIAZEM  HCL 240 mg 24 hr capsule TAKE 1 CAPSULE(240 MG) BY MOUTH DAILY     nitroGLYCERIN  (NITROSTAT ) 0.4 mg SL tablet Place one tablet (0.4 mg dose) under the tongue.     No current facility-administered medications on file prior to visit.  [5] No family history on file. [6] Social History Socioeconomic History   Marital status: Married  Tobacco Use   Smoking status: Never   Smokeless tobacco: Never

## 2024-01-13 NOTE — Progress Notes (Signed)
 Pt presents with chest congestion, nasal congestion, cough x 2 days

## 2024-01-13 NOTE — Progress Notes (Addendum)
 Subjective Patient ID: Lisa [name] is a 73 y.o. female.    73yo female with 2 days of cough, runny nose.  No chest pain.  Denies smoking.  Denies hx of asthma.     History provided by:  Patient   Review of Systems  Constitutional:  Negative for chills and fever.  HENT:  Positive for congestion and rhinorrhea. Negative for sinus pressure, sinus pain and sore throat.   Respiratory:  Positive for cough. Negative for shortness of breath.     Patient History  Allergies: Allergies  Allergen Reactions   Ceftriaxone  Anaphylaxis    Anaphylaxis, cardiac arrest Anaphylaxis, cardiac arrest    Ceftriaxone  Sodium In Dextrose  Anaphylaxis   Penicillins Anaphylaxis   Albuterol  Other and Unknown    Tachycardia-->Afib. Tolerates Xopenex  Tachycardia-->Afib. Tolerates Xopenex      Past Medical History:  Diagnosis Date   A-fib    AAA (abdominal aortic aneurysm) without rupture (CMS/HCC)    Anxiety    Diabetes    Essential hypertension    History reviewed. No pertinent surgical history. Social History   Socioeconomic History   Marital status: Married    Spouse name: Not on file   Number of children: Not on file   Years of education: Not on file   Highest education level: Not on file  Occupational History   Not on file  Tobacco Use   Smoking status: Never   Smokeless tobacco: Never  Vaping Use   Vaping status: Never Used  Substance and Sexual Activity   Alcohol use: Never   Drug use: Never   Sexual activity: Defer  Other Topics Concern   Not on file  Social History Narrative   Not on file   History reviewed. No pertinent family history. Current Outpatient Medications on File Prior to Visit  Medication Sig Dispense Refill   aspirin  81 MG EC tablet Take 81 mg by mouth.     atorvastatin  (Lipitor ) 40 MG tablet Take 40 mg by mouth 1 (one) time each day in the evening.     dilTIAZem  CD (Cardizem  CD) 240 MG 24 hr capsule Take by mouth 1  (one) time each day.     Insulin  Disposable Pump (Omnipod Classic Pods, Gen 3,) misc      ipratropium (Atrovent ) 0.06 % nasal spray 1-2 spray each nostril three times a day as needed for nasal congestion. 15 mL 0   levalbuterol  (Xopenex ) 45 MCG/ACT inhaler Inhale 2 puffs.     nitroglycerin  (Nitrostat ) 0.4 MG SL tablet Place 0.4 mg under the tongue.     NovoLOG  100 UNIT/ML solution MAX TOTAL DAILY USE OF INSULIN  35 UNITS USED WITH INSULIN  PUMP     ALPRAZolam  (Xanax ) 0.25 MG tablet TAKE 1 TABLET BY MOUTH TWICE DAILY AS NEEDED FOR STRESS AND ANXIETY (Patient not taking: Reported on 01/13/2024)     benzonatate  (Tessalon  Perles) 100 MG capsule T 1-2 tab po tid prn (Patient not taking: Reported on 01/13/2024) 20 capsule 0   ergocalciferol  (Vitamin D -2) 1.25 MG (50000 UT) capsule Take 50,000 Units by mouth.     No current facility-administered medications on file prior to visit.    Objective  Vitals:   01/13/24 1433  BP: 128/68  Pulse: 75  Resp: 18  Temp: (!) 36.2 C (97.2 F)  SpO2: (!) 90%  Weight: 58.1 kg  Height: 5' 4  PainSc: 0-No pain        OBGYN/Pregnancy Status: Postmenopausal      No results found.  Physical  Exam Constitutional:      Appearance: Normal appearance.  HENT:     Head: Normocephalic and atraumatic.     Nose: Nose normal.     Mouth/Throat:     Mouth: Mucous membranes are dry.     Pharynx: No posterior oropharyngeal erythema.  Eyes:     Extraocular Movements: Extraocular movements intact.     Pupils: Pupils are equal, round, and reactive to light.  Cardiovascular:     Rate and Rhythm: Normal rate and regular rhythm.  Pulmonary:     Effort: Pulmonary effort is normal. No respiratory distress.     Breath sounds: Normal breath sounds. No stridor. No wheezing or rhonchi.  Musculoskeletal:        General: Normal range of motion.     Cervical back: Normal range of motion and neck supple.  Skin:    General: Skin is warm and dry.  Neurological:      Mental Status: She is alert.     Results for orders placed or performed in visit on 01/13/24  POCT QuickVue Antigen Test  Component Result   Rapid Covid Negative   Internal Quality Control Pass       Procedures MDM:     1 Acute, uncomplicated illness or injury     Explanation of Medical Decision Making and variances from expected care:  Ddx:  bronchitis, viral uri   Unique ordered tests: Two     Review of any test results: Two     Assessment requiring historian other than patient: No     Independent visualization of image, tracing, or test: No     Discussion of management with another provider: No     Risk:: Moderate            Assessment/Plan Diagnoses and all orders for this visit:  Bronchitis -     dexamethasone (Decadron) injection 10 mg  Other orders -     POCT QuickVue Antigen Test -     POCT RAPID INFLUENZA MCKESSON     Disposition Status: Home  Patient Instructions  Acute Bronchitis  What Is Bronchitis? Bronchitis occurs when the bronchi (the airways in the lungs) become inflamed and produce excess mucus. This inflammation can lead to coughing, mucus production, and difficulty breathing.  ?? Bronchitis vs. Pneumonia While bronchitis and pneumonia can share symptoms such as cough, fever, fatigue, and chest discomfort, they affect different parts of the lungs:  Condition Affected Area Severity Key Differences Bronchitis Bronchial tubes Milder Usually viral, often resolves on its own Pneumonia Alveoli (air sacs) More serious Often bacterial; can be life-threatening, especially in older adults and high-risk individuals  ?? Bronchitis can sometimes progress to pneumonia. If your symptoms dont improve within a week, contact your healthcare provider.  ?? Symptoms and Causes ?? Causes of Bronchitis Most cases (>=90%) are viral (e.g., common cold, flu). A smaller number (1-10%) are bacterial.  ?? Symptoms Persistent cough with mucus Fatigue or  low energy Wheezing (may be present) Mild fever (optional) Shortness of breath  ?? Is Bronchitis Contagious? Yes, acute bronchitis is usually contagious, especially when caused by viruses or bacteria.  ?? How Long Are You Contagious? Typically contagious for 3-7 days. Antibiotics do not help if the cause is viral.  ?? How It Spreads Through respiratory droplets (coughing/sneezing) By touching contaminated surfaces and then touching your face ?? Frequent handwashing and avoiding close contact with sick individuals can reduce your risk.  ?? Management and Treatment Most cases resolve without prescription  medication. Over-the-counter (OTC) options: Expectorants (e.g., Mucinex ) to loosen mucus Fever/pain relievers (e.g., Tylenol , Ibuprofen ) Antibiotics are only used if a bacterial infection is diagnosed. Cough suppressants may be prescribed--follow all instructions on the label.  ?? Home Remedies ?? Drink fluids every 1-2 hours (aim for 8 glasses/day) ?? Get plenty of rest ?? Avoid smoking and secondhand smoke ? Warm teas, lemon water, and honey (1-2 tablespoons) may soothe the throat ?? Use acetaminophen  or aspirin  for body aches  ??? Prevention ?? Avoid smoking and secondhand smoke Limit exposure to respiratory irritants: dust, mold, air pollution, pet dander, strong cleaning products ?? Wash hands regularly with soap and water (or use alcohol-based sanitizer) ?? Eat a healthy, balanced diet ?? Rest well when recovering from any viral illness  ?? Prognosis Most people with acute bronchitis recover fully within a few days to a week. You may need to stay home from work or school while symptoms are active.  ?? When to Seek Medical Attention See a healthcare provider if you experience:  A cold lasting more than 2-3 weeks  A fever over 102F  A fever lasting more than 5 days  A cough that produces blood  Increasing shortness of breath or wheezing  A change in mucus  color (e.g., green/yellow with worsening symptoms)     Progress note signed by Vaughn Pouch, MD on 01/13/24 at  3:07 PM

## 2024-01-15 ENCOUNTER — Emergency Department

## 2024-01-15 ENCOUNTER — Inpatient Hospital Stay
Admission: EM | Admit: 2024-01-15 | Discharge: 2024-01-18 | DRG: 871 | Disposition: A | Discharge status: Home / Self Care | Attending: Student | Admitting: Student

## 2024-01-15 ENCOUNTER — Other Ambulatory Visit: Payer: Self-pay

## 2024-01-15 DIAGNOSIS — Z7982 Long term (current) use of aspirin: Secondary | ICD-10-CM

## 2024-01-15 DIAGNOSIS — I480 Paroxysmal atrial fibrillation: Secondary | ICD-10-CM | POA: Diagnosis present

## 2024-01-15 DIAGNOSIS — A419 Sepsis, unspecified organism: Principal | ICD-10-CM | POA: Diagnosis present

## 2024-01-15 DIAGNOSIS — J189 Pneumonia, unspecified organism: Secondary | ICD-10-CM | POA: Diagnosis present

## 2024-01-15 DIAGNOSIS — E785 Hyperlipidemia, unspecified: Secondary | ICD-10-CM | POA: Diagnosis present

## 2024-01-15 DIAGNOSIS — E109 Type 1 diabetes mellitus without complications: Secondary | ICD-10-CM

## 2024-01-15 DIAGNOSIS — Z9641 Presence of insulin pump (external) (internal): Secondary | ICD-10-CM | POA: Diagnosis present

## 2024-01-15 DIAGNOSIS — J9601 Acute respiratory failure with hypoxia: Secondary | ICD-10-CM | POA: Diagnosis present

## 2024-01-15 DIAGNOSIS — Z955 Presence of coronary angioplasty implant and graft: Secondary | ICD-10-CM

## 2024-01-15 DIAGNOSIS — B9789 Other viral agents as the cause of diseases classified elsewhere: Secondary | ICD-10-CM | POA: Diagnosis present

## 2024-01-15 DIAGNOSIS — Z1152 Encounter for screening for COVID-19: Secondary | ICD-10-CM

## 2024-01-15 DIAGNOSIS — Z888 Allergy status to other drugs, medicaments and biological substances status: Secondary | ICD-10-CM

## 2024-01-15 DIAGNOSIS — Z794 Long term (current) use of insulin: Secondary | ICD-10-CM

## 2024-01-15 DIAGNOSIS — Z881 Allergy status to other antibiotic agents status: Secondary | ICD-10-CM

## 2024-01-15 DIAGNOSIS — Z880 Allergy status to penicillin: Secondary | ICD-10-CM

## 2024-01-15 DIAGNOSIS — Z79899 Other long term (current) drug therapy: Secondary | ICD-10-CM

## 2024-01-15 DIAGNOSIS — I10 Essential (primary) hypertension: Secondary | ICD-10-CM | POA: Diagnosis present

## 2024-01-15 DIAGNOSIS — Z66 Do not resuscitate: Secondary | ICD-10-CM | POA: Diagnosis present

## 2024-01-15 LAB — CBC
HCT: 38.8 % (ref 36.0–46.0)
Hemoglobin: 12.9 g/dL (ref 12.0–15.0)
MCH: 27.7 pg (ref 26.0–34.0)
MCHC: 33.2 g/dL (ref 30.0–36.0)
MCV: 83.3 fL (ref 80.0–100.0)
Platelets: 269 K/uL (ref 150–400)
RBC: 4.66 MIL/uL (ref 3.87–5.11)
RDW: 14.4 % (ref 11.5–15.5)
WBC: 17.7 K/uL — ABNORMAL HIGH (ref 4.0–10.5)
nRBC: 0 % (ref 0.0–0.2)

## 2024-01-15 LAB — RESP PANEL BY RT-PCR (RSV, FLU A&B, COVID)  RVPGX2
Influenza A by PCR: NEGATIVE
Influenza B by PCR: NEGATIVE
Resp Syncytial Virus by PCR: NEGATIVE
SARS Coronavirus 2 by RT PCR: NEGATIVE

## 2024-01-15 LAB — BASIC METABOLIC PANEL WITH GFR
Anion gap: 12 (ref 5–15)
BUN: 20 mg/dL (ref 8–23)
CO2: 24 mmol/L (ref 22–32)
Calcium: 9.6 mg/dL (ref 8.9–10.3)
Chloride: 95 mmol/L — ABNORMAL LOW (ref 98–111)
Creatinine, Ser: 0.68 mg/dL (ref 0.44–1.00)
GFR, Estimated: >60 mL/min (ref >60)
Glucose, Bld: 231 mg/dL — ABNORMAL HIGH (ref 70–99)
Potassium: 4 mmol/L (ref 3.5–5.1)
Sodium: 131 mmol/L — ABNORMAL LOW (ref 135–145)

## 2024-01-15 LAB — GLUCOSE, CAPILLARY
Glucose-Capillary: 383 mg/dL — ABNORMAL HIGH (ref 70–99)
Glucose-Capillary: 449 mg/dL — ABNORMAL HIGH (ref 70–99)

## 2024-01-15 LAB — TROPONIN T, HIGH SENSITIVITY: Troponin T High Sensitivity: <15 ng/L (ref 0–19)

## 2024-01-15 LAB — PROCALCITONIN: Procalcitonin: <0.1 ng/mL

## 2024-01-15 MED ORDER — ATORVASTATIN CALCIUM 20 MG PO TABS
40.0000 mg | ORAL_TABLET | Freq: Every day | ORAL | Status: DC
  Administered 2024-01-16 – 2024-01-18 (×3): 40 mg via ORAL
  Filled 2024-01-15 (×3): qty 2

## 2024-01-15 MED ORDER — ENOXAPARIN SODIUM 40 MG/0.4ML IJ SOSY
40.0000 mg | PREFILLED_SYRINGE | INTRAMUSCULAR | Status: DC
  Administered 2024-01-15 – 2024-01-17 (×3): 40 mg via SUBCUTANEOUS
  Filled 2024-01-15 (×3): qty 0.4

## 2024-01-15 MED ORDER — INSULIN PUMP
Freq: Three times a day (TID) | SUBCUTANEOUS | Status: DC
  Filled 2024-01-15: qty 1

## 2024-01-15 MED ORDER — METHYLPREDNISOLONE SODIUM SUCC 125 MG IJ SOLR
125.0000 mg | INTRAMUSCULAR | Status: AC
  Administered 2024-01-15: 14:00:00 125 mg via INTRAVENOUS
  Filled 2024-01-15: qty 2

## 2024-01-15 MED ORDER — IPRATROPIUM BROMIDE 0.02 % IN SOLN
0.5000 mg | Freq: Four times a day (QID) | RESPIRATORY_TRACT | Status: DC
  Administered 2024-01-15 – 2024-01-16 (×3): 0.5 mg via RESPIRATORY_TRACT
  Filled 2024-01-15 (×3): qty 2.5

## 2024-01-15 MED ORDER — IPRATROPIUM BROMIDE 0.02 % IN SOLN
0.5000 mg | Freq: Once | RESPIRATORY_TRACT | Status: AC
  Administered 2024-01-15: 15:00:00 0.5 mg via RESPIRATORY_TRACT
  Filled 2024-01-15: qty 2.5

## 2024-01-15 MED ORDER — LEVALBUTEROL HCL 1.25 MG/0.5ML IN NEBU
1.2500 mg | INHALATION_SOLUTION | Freq: Once | RESPIRATORY_TRACT | Status: AC
  Administered 2024-01-15: 15:00:00 1.25 mg via RESPIRATORY_TRACT
  Filled 2024-01-15: qty 0.5

## 2024-01-15 MED ORDER — IOHEXOL 350 MG/ML SOLN
75.0000 mL | Freq: Once | INTRAVENOUS | Status: AC | PRN
  Administered 2024-01-15: 16:00:00 75 mL via INTRAVENOUS

## 2024-01-15 MED ORDER — DILTIAZEM HCL ER COATED BEADS 120 MG PO CP24
240.0000 mg | ORAL_CAPSULE | Freq: Every day | ORAL | Status: DC
  Administered 2024-01-16 – 2024-01-18 (×3): 240 mg via ORAL
  Filled 2024-01-15 (×3): qty 2

## 2024-01-15 MED ORDER — LEVALBUTEROL HCL 0.63 MG/3ML IN NEBU: 0.6300 mg | INHALATION_SOLUTION | RESPIRATORY_TRACT | Status: DC | PRN

## 2024-01-15 MED ORDER — LEVOFLOXACIN IN D5W 750 MG/150ML IV SOLN
750.0000 mg | Freq: Once | INTRAVENOUS | Status: AC
  Administered 2024-01-15: 17:00:00 750 mg via INTRAVENOUS
  Filled 2024-01-15: qty 150

## 2024-01-15 MED ORDER — ASPIRIN 81 MG PO TBEC
81.0000 mg | DELAYED_RELEASE_TABLET | Freq: Every day | ORAL | Status: DC
  Administered 2024-01-16 – 2024-01-18 (×3): 81 mg via ORAL
  Filled 2024-01-15 (×3): qty 1

## 2024-01-15 MED ORDER — LEVALBUTEROL HCL 1.25 MG/0.5ML IN NEBU
1.2500 mg | INHALATION_SOLUTION | Freq: Four times a day (QID) | RESPIRATORY_TRACT | Status: DC
  Administered 2024-01-15 – 2024-01-16 (×3): 1.25 mg via RESPIRATORY_TRACT
  Filled 2024-01-15 (×6): qty 0.5

## 2024-01-15 MED ORDER — LEVOFLOXACIN IN D5W 750 MG/150ML IV SOLN
750.0000 mg | INTRAVENOUS | Status: DC
  Administered 2024-01-16 – 2024-01-17 (×2): 750 mg via INTRAVENOUS
  Filled 2024-01-15 (×3): qty 150

## 2024-01-15 NOTE — ED Provider Notes (Signed)
°  Physical Exam  BP (!) 114/57   Pulse 69   Temp 98 F (36.7 C) (Oral)   Resp 17   Ht 5' 2 (1.575 m)   Wt 54.9 kg   SpO2 97%   BMI 22.13 kg/m   Physical Exam  Procedures  Procedures  ED Course / MDM    Medical Decision Making Amount and/or Complexity of Data Reviewed Labs: ordered. Radiology: ordered.  Risk Prescription drug management. Decision regarding hospitalization.   This patient's care was signed out to me pending CTA to evaluate for possible PE.  In summary this is a 73 year old female who presented to the emergency department for shortness of breath with a new oxygen requirement of 4 L nasal cannula.  CTA results did not show any evidence of pulmonary embolism however there is evidence of ground glass opacities concerning for pneumonia.  I discussed this result with the patient and family.  Discussed plan for admission with treatment with IV antibiotics.  The patient is agreeable with this plan of care.  I discussed the patient's case with the hospitalist who is in agreement with plan for admission at this time.         Rexford Reche HERO, MD 01/15/24 [phone]

## 2024-01-15 NOTE — ED Notes (Signed)
 Pt c/o SOB that started on Thursday, was seen at the clinic on Saturday and received a decadron shot but things just aren't getting any better. Pt states that they have been seen for same multiple times over the last 3 months.

## 2024-01-15 NOTE — ED Provider Notes (Signed)
 [facility] Provider Note    Event Date/Time   First MD Initiated Contact with Patient 01/15/24 1339     (approximate)   History   Chief Complaint: Shortness of Breath   HPI  Lisa [name] is a 73 y.o. female with a history of diabetes, paroxysmal atrial fibrillation, hypertension who comes ED complaining of shortness of breath, gradually worsening for the past 4 days.  Went to urgent care 2 days ago, was given Decadron injection without improvement.  Also has tried Xopenex  inhaler at home without relief.  Reports seeing pulmonology in the past, having PFTs done which were negative for chronic lung disease.  Denies history of asthma or home oxygen use.  Patient notes severe dyspnea on exertion at home, oxygen saturation dropping to 85% with ambulation of only a few steps.      Past Medical History:  Diagnosis Date   AAA (abdominal aortic aneurysm)    Atrial fibrillation with rapid ventricular response (HCC)    CAD (coronary artery disease)    a. 1999 PTCA @ [facility]; b. NSTEMI/Cath 10/11/2016: LM 20%, oLAD 40%, p-mLAD 40%, dLAD 40%, p-mLCx 95%, pRCA 30%, 95/76m, EF 55-65%; c. 10/2016 CT Surgery->rec PCI due to poor lung fxn. d. 12/02/16 DES to mid RCA.   CHF (congestive heart failure) (HCC)    Grade II diastolic dysfunction    Groin hematoma    a. 10/2016 Spont groin hematoma w/ anemia req 1u prbcs.   High cholesterol    History of CHF (congestive heart failure) 10/19/2023   History of kidney stones    Hypertension    Kidney stone    Left ureteral stone    Mucus plugging of bronchi    a. 10/2016 RLL Bronchus obstruction->bronchus vs mass;  b. 10/2016 CT Chest: resolution of RLL mucous plug.   Non-ST elevation (NSTEMI) myocardial infarction (HCC) 10/12/2016   NSTEMI (non-ST elevated myocardial infarction) (HCC) 10/11/2016   PAF (paroxysmal atrial fibrillation) (HCC)    a. 12/2016 Noted during diag cath--CHA2DS2VASc = 3. No OAC in setting of need for DAPT  and h/o spont Thigh Hematoma in 10/2016.   Paroxysmal atrial fibrillation (HCC)    Pericardial effusion    a. 10/2016 Echo: EF 60-65%, no rwma, Gr2 DD, small to mod pericardial eff. No tamponade; b. 12/2016 Echo: EF 60-65%, no rwma, Gr1 DD, 1.2 to 1.4 cm mod to large circumferential pericardial effusion w/o tamponade; c. 12/2016 Echo: EF 55-60%, no rwma, triv effusion.   Pneumonia 02/2016   couple times before 02/2016 too (12/02/2016)   Pneumonia due to infectious organism 11/25/2016   PVD (peripheral vascular disease)    a. Abdominal aortogram 10/11/16: Infrarenal abdominal aorta heavily calcified & ectatic with approximately 40-50% stenosis at the bifurcation. Mild diffuse disease w/ heavy calcification noted in the common & external iliac arteries bilaterally   Shock liver 10/13/2016   Type I diabetes mellitus (HCC)    a. On insulin  pump.(12/02/2016)   Ventricular fibrillation (HCC)    a. 10/11/2016: Felt to be secondary to demand ischemia in the setting of underlying CAD and anaphylactic reaction from IV Rocephin ; b. 10/2016 Echo: EF 55-60%, ? mild inf HK; c. 10/2016 Seen by EP: No ICD indication.    Current Outpatient Rx   Order #: [id number] Class: Historical Med   Order #: [id number] Class: Normal   Order #: [id number] Class: Historical Med   Order #: [id number] Class: Normal   Order #: [id number] Class: Historical Med   Order #: [id number] Class: Historical Med  Order #: [id number] Class: Normal   Order #: [id number] Class: Normal   Order #: [id number] Class: Historical Med   Order #: [id number] Class: Normal   Order #: [id number] Class: Historical Med    Past Surgical History:  Procedure Laterality Date   ABDOMINAL AORTOGRAM N/A 10/11/2016   Procedure: ABDOMINAL AORTOGRAM;  Surgeon: Mady Bruckner, MD;  Location: [facility];  Service: Cardiovascular;  Laterality: N/A;   CARDIAC CATHETERIZATION  10/2016   CATARACT EXTRACTION W/PHACO Right 11/16/2022   Procedure: CATARACT EXTRACTION PHACO  AND INTRAOCULAR LENS PLACEMENT (IOC) RIGHT DIABETIC 7.94 00:46.4;  Surgeon: Mittie Gaskin, MD;  Location: [facility];  Service: Ophthalmology;  Laterality: Right;   CATARACT EXTRACTION W/PHACO Left 11/30/2022   Procedure: CATARACT EXTRACTION PHACO AND INTRAOCULAR LENS PLACEMENT (IOC) LEFT DIABETIC;  Surgeon: Mittie Gaskin, MD;  Location: [facility];  Service: Ophthalmology;  Laterality: Left;  7.40 0:41.1   CERVICAL BIOPSY  W/ LOOP ELECTRODE EXCISION  03/05/2012   COLONOSCOPY     COLONOSCOPY WITH PROPOFOL  N/A 06/27/2018   Procedure: COLONOSCOPY WITH PROPOFOL ;  Surgeon: Dessa Reyes ORN, MD;  Location: [facility];  Service: Endoscopy;  Laterality: N/A;   COLONOSCOPY WITH PROPOFOL  N/A 03/24/2022   Procedure: COLONOSCOPY WITH PROPOFOL ;  Surgeon: Jinny Carmine, MD;  Location: [facility];  Service: Endoscopy;  Laterality: N/A;   COLPOSCOPY  01/17/2011   CORONARY ANGIOPLASTY WITH STENT PLACEMENT  1999; 12/02/2016   1 stent  + 1 stent   CORONARY ATHERECTOMY N/A 12/02/2016   Procedure: CORONARY ATHERECTOMY - CSI;  Surgeon: Mady Bruckner, MD;  Location: [facility];  Service: Cardiovascular;  Laterality: N/A;   CORONARY STENT INTERVENTION N/A 12/02/2016   Procedure: CORONARY STENT INTERVENTION;  Surgeon: Mady Bruckner, MD;  Location: [facility];  Service: Cardiovascular;  Laterality: N/A;   LEFT HEART CATH AND CORONARY ANGIOGRAPHY N/A 10/11/2016   Procedure: LEFT HEART CATH AND CORONARY ANGIOGRAPHY;  Surgeon: Mady Bruckner, MD;  Location: [facility];  Service: Cardiovascular;  Laterality: N/A;   NASAL SINUS SURGERY  ~ 2009   RECONSTRUCTION OF EYELID     TEMPORARY PACEMAKER N/A 12/02/2016   Procedure: TEMPORARY PACEMAKER;  Surgeon: Mady Bruckner, MD;  Location: [facility];  Service: Cardiovascular;  Laterality: N/A;   TONSILLECTOMY      Physical Exam   Triage Vital Signs: ED Triage Vitals  Encounter Vitals  Group     BP 01/15/24 1316 125/64     Girls Systolic BP Percentile --      Girls Diastolic BP Percentile --      Boys Systolic BP Percentile --      Boys Diastolic BP Percentile --      Pulse Rate 01/15/24 1316 72     Resp 01/15/24 1316 (!) 21     Temp 01/15/24 1316 98 F (36.7 C)     Temp src --      SpO2 01/15/24 1315 90 %     Weight 01/15/24 1317 121 lb (54.9 kg)     Height 01/15/24 1317 5' 2 (1.575 m)     Head Circumference --      Peak Flow --      Pain Score 01/15/24 1329 0     Pain Loc --      Pain Education --      Exclude from Growth Chart --     Most recent vital signs: Vitals:   01/15/24 1329 01/15/24 1330  BP:  (!) 117/58  Pulse:  66  Resp:  18  Temp:    SpO2: 96% 96%    General: Awake, no distress.  CV:  Good peripheral perfusion.  Regular rate rhythm Resp:  Normal effort.  Overall clear lungs, normal expiratory phase, no expiratory wheezing.  Somewhat bronchial breath sounds diffusely. Abd:  No distention.  Soft nontender Other:  Moist oral mucosa.  No lower extremity edema.   ED Results / Procedures / Treatments   Labs (all labs ordered are listed, but only abnormal results are displayed) Labs Reviewed  BASIC METABOLIC PANEL WITH GFR - Abnormal; Notable for the following components:      Result Value   Sodium 131 (*)    Chloride 95 (*)    Glucose, Bld 231 (*)    All other components within normal limits  CBC - Abnormal; Notable for the following components:   WBC 17.7 (*)    All other components within normal limits  RESP PANEL BY RT-PCR (RSV, FLU A&B, COVID)  RVPGX2  PROCALCITONIN  TROPONIN T, HIGH SENSITIVITY     EKG Interpreted by me Normal sinus rhythm rate of 71.  Normal axis and intervals.  Normal QRS ST segments and T waves.   RADIOLOGY Chest x-ray interpreted by me, unremarkable.  Radiology report reviewed   PROCEDURES:  Procedures   MEDICATIONS ORDERED IN ED: Medications  iohexol  (OMNIPAQUE ) 350 MG/ML injection 75 mL  (has no administration in time range)  methylPREDNISolone  sodium succinate (SOLU-MEDROL ) 125 mg/2 mL injection 125 mg (125 mg Intravenous Given 01/15/24 1427)  levalbuterol  (XOPENEX ) nebulizer solution 1.25 mg (1.25 mg Nebulization Given 01/15/24 1430)  ipratropium (ATROVENT ) nebulizer solution 0.5 mg (0.5 mg Nebulization Given 01/15/24 1430)     IMPRESSION / MDM / ASSESSMENT AND PLAN / ED COURSE  I reviewed the triage vital signs and the nursing notes.  DDx: Pneumonia, pleural effusion, pulmonary edema, pneumothorax, pulmonary embolism, pericardial effusion, COVID, influenza  Patient's presentation is most consistent with acute presentation with potential threat to life or bodily function.  Patient presents with shortness of breath worsening for the past few days, not improved with Decadron or Xopenex  previously.  No dizziness or chest pain, no fever.  Exam unrevealing except for hypoxia to 85% on room air, requiring 4 L nasal cannula.  Initial labs and chest x-ray unremarkable.  Will obtain CT to evaluate for pulmonary embolism versus occult pneumonia.       FINAL CLINICAL IMPRESSION(S) / ED DIAGNOSES   Final diagnoses:  Acute respiratory failure with hypoxia (HCC)  Type 1 diabetes mellitus without complications (HCC)  PAF (paroxysmal atrial fibrillation) (HCC)     Rx / DC Orders   ED Discharge Orders     None        Note:  This document was prepared using Dragon voice recognition software and may include unintentional dictation errors.   Viviann Pastor, MD 01/15/24 [phone]

## 2024-01-15 NOTE — H&P (Signed)
 HISTORY AND PHYSICAL    Lisa [name]   FMW:[id number] DOB: 14-Feb-1950   Date of Service: 01/15/2024 Requesting physician/APP from ED: Treatment Team:  Attending Provider: Delcie Ruppert, DO  PCP: Abbey Bruckner, MD     HPI: Lisa [name] is a 73 y.o. female with a history of diabetes, paroxysmal atrial fibrillation, hypertension who comes ED complaining of shortness of breath, gradually worsening for the past 4 days.  Went to urgent care 2 days ago, was given Decadron injection without improvement.  Also has tried Xopenex  inhaler at home without relief.  Reports seeing pulmonology in the past, having PFTs done which were negative for chronic lung disease.  Denies history of asthma or home oxygen use.   Patient notes severe dyspnea on exertion at home, oxygen saturation dropping to 85% with ambulation of only a few steps. Came to the ED  ED course: RR 21, WBC 17 meeting sepsis criteria but only just since RR normalized quickly. SpO2 90% RA, improved on 4L O2.  CTA chest, (+)scattered PNA on LUL, no PE. Levaquin  started given Cef/PCN severe allergy history.     Consultants:  none  Procedures: none      ASSESSMENT & PLAN:   Sepsis d/t community acquired pneumonia Acute hypoxic respiratory failure  Levaquin  O2 Pend BCx Xopenex  nebs scheduled and prn (albuterol  causes tachycardia/Afib)  Ipratropium nebs scheduled   Essential HTN Borderline low BP Hold losartan   Maintain cardizem    Type 1 DM on insulin  pump Pt can maintain her own insulin    HLD Statin        DVT prophylaxis: lovenox  Pertinent IV fluids/nutrition: no IV fluids, carb diet  Central lines / invasive devices: none  Code Status: DNR - pt confirmed would NOT want CPR or intubation. Husband is present at bedside also for this conversation  Family Communication: bushand at bedside on admission  Disposition: observation, med/surg  TOC needs: TBD expect none  Barriers to discharge /  significant pending items: O2 requirement                   Review of Systems:  Review of Systems  Constitutional:  Positive for chills and malaise/fatigue. Negative for fever and weight loss.  HENT:  Positive for congestion and sinus pain.   Respiratory:  Positive for cough, sputum production, shortness of breath and wheezing. Negative for hemoptysis.   Cardiovascular:  Negative for chest pain, orthopnea and leg swelling.  Gastrointestinal:  Negative for abdominal pain, constipation, diarrhea, heartburn, nausea and vomiting.  Genitourinary:  Negative for dysuria, frequency and urgency.  Musculoskeletal:  Negative for myalgias.  Neurological:  Negative for dizziness.       has a past medical history of AAA (abdominal aortic aneurysm), Atrial fibrillation with rapid ventricular response (HCC), CAD (coronary artery disease), CHF (congestive heart failure) (HCC), Grade II diastolic dysfunction, Groin hematoma, High cholesterol, History of CHF (congestive heart failure) (10/19/2023), History of kidney stones, Hypertension, Kidney stone, Left ureteral stone, Mucus plugging of bronchi, Non-ST elevation (NSTEMI) myocardial infarction (HCC) (10/12/2016), NSTEMI (non-ST elevated myocardial infarction) (HCC) (10/11/2016), PAF (paroxysmal atrial fibrillation) (HCC), Paroxysmal atrial fibrillation (HCC), Pericardial effusion, Pneumonia (02/2016), Pneumonia due to infectious organism (11/25/2016), PVD (peripheral vascular disease), Shock liver (10/13/2016), Type I diabetes mellitus (HCC), and Ventricular fibrillation (HCC). Show/hide medication list[1]  Allergies[2]    family history includes Aortic aneurysm in her father; Hypertension in her brother and mother. Past Surgical History:  Procedure Laterality Date   ABDOMINAL AORTOGRAM N/A 10/11/2016  Procedure: ABDOMINAL AORTOGRAM;  Surgeon: Mady Bruckner, MD;  Location: [facility];  Service: Cardiovascular;  Laterality:  N/A;   CARDIAC CATHETERIZATION  10/2016   CATARACT EXTRACTION W/PHACO Right 11/16/2022   Procedure: CATARACT EXTRACTION PHACO AND INTRAOCULAR LENS PLACEMENT (IOC) RIGHT DIABETIC 7.94 00:46.4;  Surgeon: Mittie Gaskin, MD;  Location: [facility];  Service: Ophthalmology;  Laterality: Right;   CATARACT EXTRACTION W/PHACO Left 11/30/2022   Procedure: CATARACT EXTRACTION PHACO AND INTRAOCULAR LENS PLACEMENT (IOC) LEFT DIABETIC;  Surgeon: Mittie Gaskin, MD;  Location: [facility];  Service: Ophthalmology;  Laterality: Left;  7.40 0:41.1   CERVICAL BIOPSY  W/ LOOP ELECTRODE EXCISION  03/05/2012   COLONOSCOPY     COLONOSCOPY WITH PROPOFOL  N/A 06/27/2018   Procedure: COLONOSCOPY WITH PROPOFOL ;  Surgeon: Dessa Reyes ORN, MD;  Location: [facility];  Service: Endoscopy;  Laterality: N/A;   COLONOSCOPY WITH PROPOFOL  N/A 03/24/2022   Procedure: COLONOSCOPY WITH PROPOFOL ;  Surgeon: Jinny Carmine, MD;  Location: [facility];  Service: Endoscopy;  Laterality: N/A;   COLPOSCOPY  01/17/2011   CORONARY ANGIOPLASTY WITH STENT PLACEMENT  1999; 12/02/2016   1 stent  + 1 stent   CORONARY ATHERECTOMY N/A 12/02/2016   Procedure: CORONARY ATHERECTOMY - CSI;  Surgeon: Mady Bruckner, MD;  Location: [facility];  Service: Cardiovascular;  Laterality: N/A;   CORONARY STENT INTERVENTION N/A 12/02/2016   Procedure: CORONARY STENT INTERVENTION;  Surgeon: Mady Bruckner, MD;  Location: [facility];  Service: Cardiovascular;  Laterality: N/A;   LEFT HEART CATH AND CORONARY ANGIOGRAPHY N/A 10/11/2016   Procedure: LEFT HEART CATH AND CORONARY ANGIOGRAPHY;  Surgeon: Mady Bruckner, MD;  Location: [facility];  Service: Cardiovascular;  Laterality: N/A;   NASAL SINUS SURGERY  ~ 2009   RECONSTRUCTION OF EYELID     TEMPORARY PACEMAKER N/A 12/02/2016   Procedure: TEMPORARY PACEMAKER;  Surgeon: Mady Bruckner, MD;  Location: [facility];  Service:  Cardiovascular;  Laterality: N/A;   TONSILLECTOMY            Objective Findings:  Vitals:   01/15/24 1530 01/15/24 1600 01/15/24 1630 01/15/24 1642  BP: 112/62 (!) 113/49 (!) 114/57   Pulse: 63 62 69   Resp:   17   Temp:    98 F (36.7 C)  TempSrc:    Oral  SpO2: 97% 92% 97%   Weight:      Height:       No intake or output data in the 24 hours ending 01/15/24 1857 Filed Weights   01/15/24 1317  Weight: 54.9 kg    Examination:  Physical Exam Constitutional:      General: She is not in acute distress.    Appearance: She is not ill-appearing.  Cardiovascular:     Rate and Rhythm: Normal rate and regular rhythm.  Pulmonary:     Effort: Pulmonary effort is normal.     Breath sounds: Examination of the left-middle field reveals wheezing. Wheezing present.  Neurological:     General: No focal deficit present.     Mental Status: She is alert and oriented to person, place, and time.  Psychiatric:        Mood and Affect: Mood normal.        Behavior: Behavior normal.          Scheduled Medications:   [START ON 01/16/2024] aspirin  EC  81 mg Oral Daily   [START ON 01/16/2024] atorvastatin   40 mg Oral Daily   [  START ON 01/16/2024] diltiazem   240 mg Oral Daily   enoxaparin  (LOVENOX ) injection  40 mg Subcutaneous Q24H   ipratropium  0.5 mg Nebulization Q6H WA   levalbuterol   1.25 mg Nebulization Q6H WA    Continuous Infusions:  [START ON 01/16/2024] levofloxacin  (LEVAQUIN ) IV      PRN Medications:  levalbuterol   Antimicrobials:  Anti-infectives (From admission, onward)    Start     Dose/Rate Route Frequency Ordered Stop   01/16/24 1700  levofloxacin  (LEVAQUIN ) IVPB 750 mg        750 mg 100 mL/hr over 90 Minutes Intravenous Every 24 hours 01/15/24 1745     01/15/24 1700  levofloxacin  (LEVAQUIN ) IVPB 750 mg        750 mg 100 mL/hr over 90 Minutes Intravenous  Once 01/15/24 1654 01/15/24 1843           Data Reviewed: I have personally reviewed  following labs and imaging studies  CBC: Recent Labs  Lab 01/15/24 1320  WBC 17.7*  HGB 12.9  HCT 38.8  MCV 83.3  PLT 269   Basic Metabolic Panel: Recent Labs  Lab 01/15/24 1320  NA 131*  K 4.0  CL 95*  CO2 24  GLUCOSE 231*  BUN 20  CREATININE 0.68  CALCIUM  9.6   GFR: Estimated Creatinine Clearance: 51 mL/min (by C-G formula based on SCr of 0.68 mg/dL). Liver Function Tests: No results for input(s): AST, ALT, ALKPHOS, BILITOT, PROT, ALBUMIN in the last 168 hours. No results for input(s): LIPASE, AMYLASE in the last 168 hours. No results for input(s): AMMONIA in the last 168 hours. Coagulation Profile: No results for input(s): INR, PROTIME in the last 168 hours. Cardiac Enzymes: No results for input(s): CKTOTAL, CKMB, CKMBINDEX, TROPONINI in the last 168 hours. BNP (last 3 results) No results for input(s): PROBNP in the last 8760 hours. HbA1C: No results for input(s): HGBA1C in the last 72 hours. CBG: No results for input(s): GLUCAP in the last 168 hours. Lipid Profile: No results for input(s): CHOL, HDL, LDLCALC, TRIG, CHOLHDL, LDLDIRECT in the last 72 hours. Thyroid  Function Tests: No results for input(s): TSH, T4TOTAL, FREET4, T3FREE, THYROIDAB in the last 72 hours. Anemia Panel: No results for input(s): VITAMINB12, FOLATE, FERRITIN, TIBC, IRON, RETICCTPCT in the last 72 hours. Most Recent Urinalysis On File:     Component Value Date/Time   COLORURINE YELLOW 08/09/2019 1250   APPEARANCEUR CLOUDY (A) 08/09/2019 1250   APPEARANCEUR Clear 05/07/2019 0955   LABSPEC 1.010 08/09/2019 1250   PHURINE 6.5 08/09/2019 1250   GLUCOSEU NEGATIVE 08/09/2019 1250   HGBUR MODERATE (A) 08/09/2019 1250   BILIRUBINUR NEGATIVE 08/09/2019 1250   BILIRUBINUR Negative 05/07/2019 0955   KETONESUR NEGATIVE 08/09/2019 1250   PROTEINUR 30 (A) 08/09/2019 1250   NITRITE POSITIVE (A) 08/09/2019 1250   LEUKOCYTESUR  LARGE (A) 08/09/2019 1250   Sepsis Labs: @LABRCNTIP (procalcitonin:4,lacticidven:4)  Recent Results (from the past 240 hours)  Resp panel by RT-PCR (RSV, Flu A&B, Covid) Anterior Nasal Swab     Status: None   Collection Time: 01/15/24  4:46 PM   Specimen: Anterior Nasal Swab  Result Value Ref Range Status   SARS Coronavirus 2 by RT PCR NEGATIVE NEGATIVE Final    Comment: (NOTE) SARS-CoV-2 target nucleic acids are NOT DETECTED.  The SARS-CoV-2 RNA is generally detectable in upper respiratory specimens during the acute phase of infection. The lowest concentration of SARS-CoV-2 viral copies this assay can detect is 138 copies/mL. A negative result does not preclude SARS-Cov-2  infection and should not be used as the sole basis for treatment or other patient management decisions. A negative result may occur with  improper specimen collection/handling, submission of specimen other than nasopharyngeal swab, presence of viral mutation(s) within the areas targeted by this assay, and inadequate number of viral copies(<138 copies/mL). A negative result must be combined with clinical observations, patient history, and epidemiological information. The expected result is Negative.  Fact Sheet for Patients:  bloggercourse.com  Fact Sheet for Healthcare Providers:  seriousbroker.it  This test is no t yet approved or cleared by the [place]  FDA and  has been authorized for detection and/or diagnosis of SARS-CoV-2 by FDA under an Emergency Use Authorization (EUA). This EUA will remain  in effect (meaning this test can be used) for the duration of the COVID-19 declaration under Section 564(b)(1) of the Act, 21 U.S.C.section 360bbb-3(b)(1), unless the authorization is terminated  or revoked sooner.       Influenza A by PCR NEGATIVE NEGATIVE Final   Influenza B by PCR NEGATIVE NEGATIVE Final    Comment: (NOTE) The Xpert Xpress  SARS-CoV-2/FLU/RSV plus assay is intended as an aid in the diagnosis of influenza from Nasopharyngeal swab specimens and should not be used as a sole basis for treatment. Nasal washings and aspirates are unacceptable for Xpert Xpress SARS-CoV-2/FLU/RSV testing.  Fact Sheet for Patients: bloggercourse.com  Fact Sheet for Healthcare Providers: seriousbroker.it  This test is not yet approved or cleared by the [place]  FDA and has been authorized for detection and/or diagnosis of SARS-CoV-2 by FDA under an Emergency Use Authorization (EUA). This EUA will remain in effect (meaning this test can be used) for the duration of the COVID-19 declaration under Section 564(b)(1) of the Act, 21 U.S.C. section 360bbb-3(b)(1), unless the authorization is terminated or revoked.     Resp Syncytial Virus by PCR NEGATIVE NEGATIVE Final    Comment: (NOTE) Fact Sheet for Patients: bloggercourse.com  Fact Sheet for Healthcare Providers: seriousbroker.it  This test is not yet approved or cleared by the [place]  FDA and has been authorized for detection and/or diagnosis of SARS-CoV-2 by FDA under an Emergency Use Authorization (EUA). This EUA will remain in effect (meaning this test can be used) for the duration of the COVID-19 declaration under Section 564(b)(1) of the Act, 21 U.S.C. section 360bbb-3(b)(1), unless the authorization is terminated or revoked.  Performed at [facility], [address] [place], [place] [address]          Radiology Studies: CT Angio Chest PE W and/or Wo Contrast Result Date: 01/15/2024 CLINICAL DATA:  Shortness of breath. Concern for pulmonary embolism. EXAM: CT ANGIOGRAPHY CHEST WITH CONTRAST TECHNIQUE: Multidetector CT imaging of the chest was performed using the standard protocol during bolus administration of intravenous contrast.  Multiplanar CT image reconstructions and MIPs were obtained to evaluate the vascular anatomy. RADIATION DOSE REDUCTION: This exam was performed according to the departmental dose-optimization program which includes automated exposure control, adjustment of the mA and/or kV according to patient size and/or use of iterative reconstruction technique. CONTRAST:  75mL OMNIPAQUE  IOHEXOL  350 MG/ML SOLN COMPARISON:  Chest CT dated 12/08/2016. FINDINGS: Cardiovascular: No cardiomegaly or pericardial effusion. Advanced 3 vessel coronary vascular calcification. Moderate atherosclerotic calcification of the thoracic aorta. No aneurysmal dilatation. Evaluation of the pulmonary arteries is limited due to respiratory motion. No pulmonary artery embolus identified. Mediastinum/Nodes: No hilar or mediastinal adenopathy. The esophagus is grossly unremarkable. No mediastinal fluid collection. Lungs/Pleura: Scattered small clusters of ground-glass density primarily  in the left upper lobe suspicious for pneumonia, possibly atypical in etiology. Bilateral linear atelectasis/scarring. No pleural effusion pneumothorax. The central airways are patent. Upper Abdomen: No acute abnormality. Musculoskeletal: No acute osseous pathology. Review of the MIP images confirms the above findings. IMPRESSION: 1. No CT evidence of pulmonary embolism. 2. Scattered small clusters of ground-glass density primarily in the left upper lobe suspicious for pneumonia, possibly atypical in etiology. 3.  Aortic Atherosclerosis (ICD10-I70.0). Electronically Signed   By: Vanetta Chou M.D.   On: 01/15/2024 16:23   DG Chest 2 View Result Date: 01/15/2024 EXAM: 2 VIEW(S) XRAY OF THE CHEST 01/15/2024 01:43:46 PM COMPARISON: 11/17/2023 CLINICAL HISTORY: sob FINDINGS: LUNGS AND PLEURA: No focal pulmonary opacity. No pleural effusion. No pneumothorax. HEART AND MEDIASTINUM: Atherosclerotic plaque. No acute abnormality of the cardiac and mediastinal silhouettes.  BONES AND SOFT TISSUES: No acute osseous abnormality. IMPRESSION: 1. No acute cardiopulmonary process. Electronically signed by: Waddell Calk MD 01/15/2024 01:58 PM EST RP Workstation: [id number]             LOS: 0 days     Laneta Blunt, DO [facility] 01/15/2024, 6:57 PM    Dictation software may have been used to generate the above note. Typos may occur and escape review in typed/dictated notes. Please contact Dr Blunt directly for clarity if needed.  Staff may message me via secure chat in Epic  but this may not receive an immediate response,  please page me for urgent matters!  If 7PM-7AM, please contact night coverage [email or web address]          [1] (Not in an outpatient encounter) [2]  Allergies Allergen Reactions   Ceftriaxone  Other (See Comments)    Anaphylaxis, cardiac arrest   Penicillins Anaphylaxis   Rocephin  [Ceftriaxone  Sodium In Dextrose ] Anaphylaxis   Albuterol  Other (See Comments)    Tachycardia-->Afib. Tolerates Xopenex 

## 2024-01-15 NOTE — ED Triage Notes (Addendum)
 Pt comes with c/o sob. Pt states this has been ongoing but worse. Pt went to UC on SAturday and given decadron shot. Pt 87 % Ra. Pt placed on 2L by RN Slater pt O2 now at 89%. Pt does have wheezing noted. Pt increased to 4L to see if change.  Pt was neg for covid flu and rSV

## 2024-01-16 DIAGNOSIS — I10 Essential (primary) hypertension: Secondary | ICD-10-CM | POA: Diagnosis present

## 2024-01-16 DIAGNOSIS — Z881 Allergy status to other antibiotic agents status: Secondary | ICD-10-CM | POA: Diagnosis not present

## 2024-01-16 DIAGNOSIS — Z79899 Other long term (current) drug therapy: Secondary | ICD-10-CM | POA: Diagnosis not present

## 2024-01-16 DIAGNOSIS — Z880 Allergy status to penicillin: Secondary | ICD-10-CM | POA: Diagnosis not present

## 2024-01-16 DIAGNOSIS — Z794 Long term (current) use of insulin: Secondary | ICD-10-CM | POA: Diagnosis not present

## 2024-01-16 DIAGNOSIS — E109 Type 1 diabetes mellitus without complications: Secondary | ICD-10-CM | POA: Diagnosis present

## 2024-01-16 DIAGNOSIS — E785 Hyperlipidemia, unspecified: Secondary | ICD-10-CM | POA: Diagnosis present

## 2024-01-16 DIAGNOSIS — Z955 Presence of coronary angioplasty implant and graft: Secondary | ICD-10-CM | POA: Diagnosis not present

## 2024-01-16 DIAGNOSIS — J9601 Acute respiratory failure with hypoxia: Secondary | ICD-10-CM | POA: Diagnosis present

## 2024-01-16 DIAGNOSIS — J189 Pneumonia, unspecified organism: Secondary | ICD-10-CM | POA: Diagnosis present

## 2024-01-16 DIAGNOSIS — Z9641 Presence of insulin pump (external) (internal): Secondary | ICD-10-CM | POA: Diagnosis present

## 2024-01-16 DIAGNOSIS — B348 Other viral infections of unspecified site: Secondary | ICD-10-CM | POA: Diagnosis not present

## 2024-01-16 DIAGNOSIS — I480 Paroxysmal atrial fibrillation: Secondary | ICD-10-CM | POA: Diagnosis present

## 2024-01-16 DIAGNOSIS — Z7982 Long term (current) use of aspirin: Secondary | ICD-10-CM | POA: Diagnosis not present

## 2024-01-16 DIAGNOSIS — Z66 Do not resuscitate: Secondary | ICD-10-CM | POA: Diagnosis present

## 2024-01-16 DIAGNOSIS — A419 Sepsis, unspecified organism: Secondary | ICD-10-CM | POA: Diagnosis present

## 2024-01-16 DIAGNOSIS — B9789 Other viral agents as the cause of diseases classified elsewhere: Secondary | ICD-10-CM | POA: Diagnosis present

## 2024-01-16 DIAGNOSIS — Z1152 Encounter for screening for COVID-19: Secondary | ICD-10-CM | POA: Diagnosis not present

## 2024-01-16 DIAGNOSIS — Z888 Allergy status to other drugs, medicaments and biological substances status: Secondary | ICD-10-CM | POA: Diagnosis not present

## 2024-01-16 LAB — CBC
HCT: 36.1 % (ref 36.0–46.0)
Hemoglobin: 12.1 g/dL (ref 12.0–15.0)
MCH: 27.6 pg (ref 26.0–34.0)
MCHC: 33.5 g/dL (ref 30.0–36.0)
MCV: 82.4 fL (ref 80.0–100.0)
Platelets: 253 K/uL (ref 150–400)
RBC: 4.38 MIL/uL (ref 3.87–5.11)
RDW: 14.1 % (ref 11.5–15.5)
WBC: 14.2 K/uL — ABNORMAL HIGH (ref 4.0–10.5)
nRBC: 0 % (ref 0.0–0.2)

## 2024-01-16 LAB — BASIC METABOLIC PANEL WITH GFR
Anion gap: 12 (ref 5–15)
BUN: 21 mg/dL (ref 8–23)
CO2: 23 mmol/L (ref 22–32)
Calcium: 8.8 mg/dL — ABNORMAL LOW (ref 8.9–10.3)
Chloride: 94 mmol/L — ABNORMAL LOW (ref 98–111)
Creatinine, Ser: 0.69 mg/dL (ref 0.44–1.00)
GFR, Estimated: >60 mL/min (ref >60)
Glucose, Bld: 289 mg/dL — ABNORMAL HIGH (ref 70–99)
Potassium: 4.3 mmol/L (ref 3.5–5.1)
Sodium: 129 mmol/L — ABNORMAL LOW (ref 135–145)

## 2024-01-16 LAB — GLUCOSE, RANDOM: Glucose, Bld: 368 mg/dL — ABNORMAL HIGH (ref 70–99)

## 2024-01-16 LAB — GLUCOSE, CAPILLARY
Glucose-Capillary: 213 mg/dL — ABNORMAL HIGH (ref 70–99)
Glucose-Capillary: 226 mg/dL — ABNORMAL HIGH (ref 70–99)
Glucose-Capillary: 277 mg/dL — ABNORMAL HIGH (ref 70–99)
Glucose-Capillary: 290 mg/dL — ABNORMAL HIGH (ref 70–99)
Glucose-Capillary: 305 mg/dL — ABNORMAL HIGH (ref 70–99)
Glucose-Capillary: 307 mg/dL — ABNORMAL HIGH (ref 70–99)
Glucose-Capillary: 316 mg/dL — ABNORMAL HIGH (ref 70–99)
Glucose-Capillary: 345 mg/dL — ABNORMAL HIGH (ref 70–99)
Glucose-Capillary: 391 mg/dL — ABNORMAL HIGH (ref 70–99)

## 2024-01-16 LAB — BETA-HYDROXYBUTYRIC ACID: Beta-Hydroxybutyric Acid: 0.11 mmol/L (ref 0.05–0.27)

## 2024-01-16 MED ORDER — IPRATROPIUM BROMIDE 0.02 % IN SOLN
0.5000 mg | Freq: Three times a day (TID) | RESPIRATORY_TRACT | Status: DC
  Administered 2024-01-16 – 2024-01-17 (×2): 0.5 mg via RESPIRATORY_TRACT
  Filled 2024-01-16 (×2): qty 2.5

## 2024-01-16 MED ORDER — INSULIN ASPART 100 UNIT/ML IJ SOLN
5.0000 [IU] | Freq: Once | INTRAMUSCULAR | Status: AC
  Administered 2024-01-16: 05:00:00 5 [IU] via SUBCUTANEOUS
  Filled 2024-01-16: qty 5

## 2024-01-16 MED ORDER — LEVALBUTEROL HCL 1.25 MG/0.5ML IN NEBU
1.2500 mg | INHALATION_SOLUTION | Freq: Three times a day (TID) | RESPIRATORY_TRACT | Status: DC
  Administered 2024-01-16 – 2024-01-17 (×2): 1.25 mg via RESPIRATORY_TRACT
  Filled 2024-01-16 (×4): qty 0.5

## 2024-01-16 MED ORDER — INSULIN ASPART 100 UNIT/ML IJ SOLN
7.0000 [IU] | Freq: Once | INTRAMUSCULAR | Status: AC
  Administered 2024-01-16: 7 [IU] via SUBCUTANEOUS
  Filled 2024-01-16: qty 7

## 2024-01-16 MED ORDER — INSULIN PUMP
SUBCUTANEOUS | Status: DC
  Administered 2024-01-17: 09:00:00 4.25 via SUBCUTANEOUS
  Filled 2024-01-16: qty 1

## 2024-01-16 MED ORDER — DEXTROSE 5 % IV SOLN: 250.0000 mg | INTRAVENOUS | Status: DC

## 2024-01-16 NOTE — Care Management Obs Status (Signed)
 MEDICARE OBSERVATION STATUS NOTIFICATION   Patient Details  Name: Lisa [name] MRN: [id number] Date of Birth: Feb 15, 1950   Medicare Observation Status Notification Given:  Yes    Rojelio [name] 01/16/2024, 1:33 PM

## 2024-01-16 NOTE — Progress Notes (Signed)
 SATURATION QUALIFICATIONS: (This note is used to comply with regulatory documentation for home oxygen)  Patient Saturations on Room Air at Rest = 91%  Patient Saturations on Room Air while Ambulating = 86%  Patient Saturations on 1 Liters of oxygen while Ambulating = 93%  Please briefly explain why patient needs home oxygen:

## 2024-01-16 NOTE — Inpatient Diabetes Management (Addendum)
 Inpatient Diabetes Program Recommendations  AACE/ADA: New Consensus Statement on Inpatient Glycemic Control   Target Ranges:  Prepandial:   less than 140 mg/dL      Peak postprandial:   less than 180 mg/dL (1-2 hours)      Critically ill patients:  140 - 180 mg/dL    Latest Reference Range & Units 01/16/24 00:21 01/16/24 01:17 01/16/24 03:08 01/16/24 06:29 01/16/24 08:11  Glucose-Capillary 70 - 99 mg/dL 608 (H) 654 (H) 694 (H) 213 (H) 226 (H)    Latest Reference Range & Units 01/15/24 21:56 01/15/24 22:52  Glucose-Capillary 70 - 99 mg/dL 616 (H) 550 (H)   Review of Glycemic Control  Diabetes history: DM1 Outpatient Diabetes medications: OmniPod Insulin  Pump with Novolog , Dexcom G7 CGM Current orders for Inpatient glycemic control: Insulin  Pump AC&HS and 2am  Inpatient Diabetes Program Recommendations:    Insulin  Pump: Patient received Decadron 10 mg on 12/13 and she received Solumedrol 125 mg x1 on 01/15/24. Steroids are contributing to hyperglycemia.  Please consider changing CBGs to Q4H and Insulin  Pump to Q4H so patient can correct more often if needed.  NOTE: Patient with Type 1 DM uses OmniPod insulin  pump with Novolog  for DM control. Per chart review, patient sees Dr. Beryl (Endocrinologist) and was last seen on 01/03/24.  Spoke with patient over the phone. She confirms she has on her OmniPod insulin  pump with Novolog  insulin  and Dexcom G7 CGM sensor. Patient states she uses auto mode on her pump. She confirms that her glucose has been running higher since she has been sick and received steroids on 01/13/24. Patient reports that prior to being sick, her glucose was trending well.  Discussed that she received Solumedrol 125 mg yesterday and it was noted that she also received SQ insulin  injection at 00:22 and 5:26 am on 01/16/24 (along with insulin  she is receiving from insulin  pump). Discussed that I would ask attending provider about changing CBGs and Insulin  pump to Q4H so she can  correct glucose more often as I don't want to use supplemental SQ insulin  since the pump does not know she has extra SQ insulin  on board which could lead to hypoglycemia. Patient states she would be fine with correcting every 4 hours if needed and she will continue to bolus for meals as she usually does. Patient states she has everything she needs at bedside for DM management and she has no questions or concerns regarding DM at this time. Sent chat message to Dr. Marsa and Powell, RN regarding recommendations.  Thanks, Earnie Gainer, RN, MSN, CDCES Diabetes Coordinator Inpatient Diabetes Program [phone] (Team Pager from 8am to 5pm)

## 2024-01-16 NOTE — Progress Notes (Signed)
 PROGRESS NOTE    FRANCINA [name]   FMW:[id number] DOB: 1950-04-07  DOA: 01/15/2024 Date of Service: 01/16/2024 which is hospital day 0  PCP: Abbey Bruckner, MD    Hospital course / significant events:   HPI: Lisa [name] is a 73 y.o. female with a history of diabetes, paroxysmal atrial fibrillation, hypertension who comes ED complaining of shortness of breath, gradually worsening for the past 4 days.  Went to urgent care 2 days ago, was given Decadron injection without improvement.  Also has tried Xopenex  inhaler at home without relief.  Reports seeing pulmonology in the past, having PFTs done which were negative for chronic lung disease.  Denies history of asthma or home oxygen use. Patient notes severe dyspnea on exertion at home, oxygen saturation dropping to 85% with ambulation of only a few steps. Came to the ED     12/15: ED course: RR 21, WBC 17 meeting sepsis criteria but only just since RR normalized quickly. SpO2 90% RA, improved on 4L O2.  CTA chest, (+)scattered PNA on LUL, no PE. Levaquin  started given Cef/PCN severe allergy history. Admitted to hospitalist to continue abx and O2 12/16: remains on O2 w/ significant SOB and desaturation on ambulation      Consultants:  none  Procedures/Surgeries: none      ASSESSMENT & PLAN:    Sepsis d/t community acquired pneumonia Acute hypoxic respiratory failure  No hx COPD/asthma, no hx respiratory exposure BCx never drawn, monitor sepsis parameters but all are improving, still some leukocytosis but this is trending better  Levaquin  O2 w/ daily assessment w/ ambulation  Xopenex  nebs scheduled and prn (albuterol  causes tachycardia/Afib)  Ipratropium nebs scheduled    Essential HTN Borderline low BP Hold losartan   Maintain cardizem     Type 1 DM on insulin  pump Pt can maintain her own insulin     HLD Statin         No concerns based on BMI: Body mass index is 22.13 kg/m.SABRA Significantly low or high BMI is  associated with higher medical risk.  Underweight - under 18  overweight - 25 to 29 obese - 30 or more Class 1 obesity: BMI of 30.0 to 34 Class 2 obesity: BMI of 35.0 to 39 Class 3 obesity: BMI of 40.0 to 49 Super Morbid Obesity: BMI 50-59 Super-super Morbid Obesity: BMI 60+ Healthy nutrition and physical activity advised as adjunct to other disease management and risk reduction treatments    DVT prophylaxis: lovenox  IV fluids: no continuous IV fluids  Nutrition: carb diet Central lines / other devices: insulinpump  Code Status: DNR ACP documentation reviewed: none on file in [facility]  TOC needs: TBD may need HH but unlikely to need SNF< may need home O2 Medical barriers to dispo: O2 requirement. Expected medical readiness for discharge 1-2 days or home w/ oxygenif improving .              Subjective / Brief ROS:  Patient reports still SOB today but a bit better from yesterday Denies CP/SOB at rest Pain controlled.  Denies new weakness.  Tolerating diet.  Reports no concerns w/ urination/defecation.   Family Communication: family at bedside on rounds     Objective Findings:  Vitals:   01/16/24 0310 01/16/24 0727 01/16/24 0809 01/16/24 1622  BP: 119/68  (!) 107/56 (!) 102/58  Pulse: 71  76 65  Resp: 18  18 18   Temp: 97.8 F (36.6 C)  98 F (36.7 C) 97.9 F (36.6 C)  TempSrc:  Oral Oral  SpO2: 95% 93% 98% 92%  Weight:      Height:       No intake or output data in the 24 hours ending 01/16/24 1703 Filed Weights   01/15/24 1317  Weight: 54.9 kg    Examination:  Physical Exam Constitutional:      General: She is not in acute distress. Cardiovascular:     Rate and Rhythm: Normal rate and regular rhythm.  Pulmonary:     Effort: Pulmonary effort is normal. No tachypnea, accessory muscle usage or respiratory distress.     Breath sounds: Wheezing (scattered more on L) present.  Musculoskeletal:     Right lower leg: No edema.     Left lower leg: No  edema.  Neurological:     Mental Status: She is alert and oriented to person, place, and time.  Psychiatric:        Mood and Affect: Mood normal.        Behavior: Behavior normal.          Scheduled Medications:   aspirin  EC  81 mg Oral Daily   atorvastatin   40 mg Oral Daily   diltiazem   240 mg Oral Daily   enoxaparin  (LOVENOX ) injection  40 mg Subcutaneous Q24H   insulin  pump   Subcutaneous Q4H   ipratropium  0.5 mg Nebulization TID   levalbuterol   1.25 mg Nebulization TID    Continuous Infusions:  levofloxacin  (LEVAQUIN ) IV      PRN Medications:  levalbuterol   Antimicrobials from admission:  Anti-infectives (From admission, onward)    Start     Dose/Rate Route Frequency Ordered Stop   01/16/24 1745  azithromycin  (ZITHROMAX ) 250 mg in dextrose  5 % 125 mL IVPB  Status:  Discontinued        250 mg 127.5 mL/hr over 60 Minutes Intravenous Every 24 hours 01/16/24 1659 01/16/24 1659   01/16/24 1700  levofloxacin  (LEVAQUIN ) IVPB 750 mg        750 mg 100 mL/hr over 90 Minutes Intravenous Every 24 hours 01/15/24 1745     01/15/24 1700  levofloxacin  (LEVAQUIN ) IVPB 750 mg        750 mg 100 mL/hr over 90 Minutes Intravenous  Once 01/15/24 1654 01/15/24 1843           Data Reviewed:  I have personally reviewed the following...  CBC: Recent Labs  Lab 01/15/24 1320 01/16/24 0336  WBC 17.7* 14.2*  HGB 12.9 12.1  HCT 38.8 36.1  MCV 83.3 82.4  PLT 269 253   Basic Metabolic Panel: Recent Labs  Lab 01/15/24 1320 01/16/24 0107 01/16/24 0336  NA 131*  --  129*  K 4.0  --  4.3  CL 95*  --  94*  CO2 24  --  23  GLUCOSE 231* 368* 289*  BUN 20  --  21  CREATININE 0.68  --  0.69  CALCIUM  9.6  --  8.8*   GFR: Estimated Creatinine Clearance: 51 mL/min (by C-G formula based on SCr of 0.69 mg/dL). Liver Function Tests: No results for input(s): AST, ALT, ALKPHOS, BILITOT, PROT, ALBUMIN in the last 168 hours. No results for input(s): LIPASE,  AMYLASE in the last 168 hours. No results for input(s): AMMONIA in the last 168 hours. Coagulation Profile: No results for input(s): INR, PROTIME in the last 168 hours. Cardiac Enzymes: No results for input(s): CKTOTAL, CKMB, CKMBINDEX, TROPONINI in the last 168 hours. BNP (last 3 results) No results for input(s): PROBNP in the  last 8760 hours. HbA1C: No results for input(s): HGBA1C in the last 72 hours. CBG: Recent Labs  Lab 01/16/24 0308 01/16/24 0629 01/16/24 0811 01/16/24 1132 01/16/24 1623  GLUCAP 305* 213* 226* 316* 307*   Lipid Profile: No results for input(s): CHOL, HDL, LDLCALC, TRIG, CHOLHDL, LDLDIRECT in the last 72 hours. Thyroid  Function Tests: No results for input(s): TSH, T4TOTAL, FREET4, T3FREE, THYROIDAB in the last 72 hours. Anemia Panel: No results for input(s): VITAMINB12, FOLATE, FERRITIN, TIBC, IRON, RETICCTPCT in the last 72 hours. Most Recent Urinalysis On File:     Component Value Date/Time   COLORURINE YELLOW 08/09/2019 1250   APPEARANCEUR CLOUDY (A) 08/09/2019 1250   APPEARANCEUR Clear 05/07/2019 0955   LABSPEC 1.010 08/09/2019 1250   PHURINE 6.5 08/09/2019 1250   GLUCOSEU NEGATIVE 08/09/2019 1250   HGBUR MODERATE (A) 08/09/2019 1250   BILIRUBINUR NEGATIVE 08/09/2019 1250   BILIRUBINUR Negative 05/07/2019 0955   KETONESUR NEGATIVE 08/09/2019 1250   PROTEINUR 30 (A) 08/09/2019 1250   NITRITE POSITIVE (A) 08/09/2019 1250   LEUKOCYTESUR LARGE (A) 08/09/2019 1250   Sepsis Labs: @LABRCNTIP (procalcitonin:4,lacticidven:4) Microbiology: Recent Results (from the past 240 hours)  Resp panel by RT-PCR (RSV, Flu A&B, Covid) Anterior Nasal Swab     Status: None   Collection Time: 01/15/24  4:46 PM   Specimen: Anterior Nasal Swab  Result Value Ref Range Status   SARS Coronavirus 2 by RT PCR NEGATIVE NEGATIVE Final    Comment: (NOTE) SARS-CoV-2 target nucleic acids are NOT DETECTED.  The  SARS-CoV-2 RNA is generally detectable in upper respiratory specimens during the acute phase of infection. The lowest concentration of SARS-CoV-2 viral copies this assay can detect is 138 copies/mL. A negative result does not preclude SARS-Cov-2 infection and should not be used as the sole basis for treatment or other patient management decisions. A negative result may occur with  improper specimen collection/handling, submission of specimen other than nasopharyngeal swab, presence of viral mutation(s) within the areas targeted by this assay, and inadequate number of viral copies(<138 copies/mL). A negative result must be combined with clinical observations, patient history, and epidemiological information. The expected result is Negative.  Fact Sheet for Patients:  bloggercourse.com  Fact Sheet for Healthcare Providers:  seriousbroker.it  This test is no t yet approved or cleared by the [place]  FDA and  has been authorized for detection and/or diagnosis of SARS-CoV-2 by FDA under an Emergency Use Authorization (EUA). This EUA will remain  in effect (meaning this test can be used) for the duration of the COVID-19 declaration under Section 564(b)(1) of the Act, 21 U.S.C.section 360bbb-3(b)(1), unless the authorization is terminated  or revoked sooner.       Influenza A by PCR NEGATIVE NEGATIVE Final   Influenza B by PCR NEGATIVE NEGATIVE Final    Comment: (NOTE) The Xpert Xpress SARS-CoV-2/FLU/RSV plus assay is intended as an aid in the diagnosis of influenza from Nasopharyngeal swab specimens and should not be used as a sole basis for treatment. Nasal washings and aspirates are unacceptable for Xpert Xpress SARS-CoV-2/FLU/RSV testing.  Fact Sheet for Patients: bloggercourse.com  Fact Sheet for Healthcare Providers: seriousbroker.it  This test is not yet approved or  cleared by the [place]  FDA and has been authorized for detection and/or diagnosis of SARS-CoV-2 by FDA under an Emergency Use Authorization (EUA). This EUA will remain in effect (meaning this test can be used) for the duration of the COVID-19 declaration under Section 564(b)(1) of the Act, 21 U.S.C. section 360bbb-3(b)(1), unless  the authorization is terminated or revoked.     Resp Syncytial Virus by PCR NEGATIVE NEGATIVE Final    Comment: (NOTE) Fact Sheet for Patients: bloggercourse.com  Fact Sheet for Healthcare Providers: seriousbroker.it  This test is not yet approved or cleared by the [place]  FDA and has been authorized for detection and/or diagnosis of SARS-CoV-2 by FDA under an Emergency Use Authorization (EUA). This EUA will remain in effect (meaning this test can be used) for the duration of the COVID-19 declaration under Section 564(b)(1) of the Act, 21 U.S.C. section 360bbb-3(b)(1), unless the authorization is terminated or revoked.  Performed at [facility], [address], [place], [place] [address]       Radiology Studies last 3 days: CT Angio Chest PE W and/or Wo Contrast Result Date: 01/15/2024 CLINICAL DATA:  Shortness of breath. Concern for pulmonary embolism. EXAM: CT ANGIOGRAPHY CHEST WITH CONTRAST TECHNIQUE: Multidetector CT imaging of the chest was performed using the standard protocol during bolus administration of intravenous contrast. Multiplanar CT image reconstructions and MIPs were obtained to evaluate the vascular anatomy. RADIATION DOSE REDUCTION: This exam was performed according to the departmental dose-optimization program which includes automated exposure control, adjustment of the mA and/or kV according to patient size and/or use of iterative reconstruction technique. CONTRAST:  75mL OMNIPAQUE  IOHEXOL  350 MG/ML SOLN COMPARISON:  Chest CT dated 12/08/2016. FINDINGS:  Cardiovascular: No cardiomegaly or pericardial effusion. Advanced 3 vessel coronary vascular calcification. Moderate atherosclerotic calcification of the thoracic aorta. No aneurysmal dilatation. Evaluation of the pulmonary arteries is limited due to respiratory motion. No pulmonary artery embolus identified. Mediastinum/Nodes: No hilar or mediastinal adenopathy. The esophagus is grossly unremarkable. No mediastinal fluid collection. Lungs/Pleura: Scattered small clusters of ground-glass density primarily in the left upper lobe suspicious for pneumonia, possibly atypical in etiology. Bilateral linear atelectasis/scarring. No pleural effusion pneumothorax. The central airways are patent. Upper Abdomen: No acute abnormality. Musculoskeletal: No acute osseous pathology. Review of the MIP images confirms the above findings. IMPRESSION: 1. No CT evidence of pulmonary embolism. 2. Scattered small clusters of ground-glass density primarily in the left upper lobe suspicious for pneumonia, possibly atypical in etiology. 3.  Aortic Atherosclerosis (ICD10-I70.0). Electronically Signed   By: Vanetta Chou M.D.   On: 01/15/2024 16:23   DG Chest 2 View Result Date: 01/15/2024 EXAM: 2 VIEW(S) XRAY OF THE CHEST 01/15/2024 01:43:46 PM COMPARISON: 11/17/2023 CLINICAL HISTORY: sob FINDINGS: LUNGS AND PLEURA: No focal pulmonary opacity. No pleural effusion. No pneumothorax. HEART AND MEDIASTINUM: Atherosclerotic plaque. No acute abnormality of the cardiac and mediastinal silhouettes. BONES AND SOFT TISSUES: No acute osseous abnormality. IMPRESSION: 1. No acute cardiopulmonary process. Electronically signed by: Waddell Calk MD 01/15/2024 01:58 PM EST RP Workstation: [id number]        Laneta Blunt, DO [facility] 01/16/2024, 5:03 PM    Dictation software may have been used to generate the above note. Typos may occur and escape review in typed/dictated notes. Please contact Dr Blunt directly for  clarity if needed.  Staff may message me via secure chat in Epic  but this may not receive an immediate response,  please page me for urgent matters!  If 7PM-7AM, please contact night coverage [email or web address]

## 2024-01-16 NOTE — Progress Notes (Addendum)
° °      Overnight   NAME: [name] MRN: [id number] DOB : [birth year]    Date of Service   01/16/2024   HPI/Events of Note   HPI: 73 year old female with history of diabetes, paroxysmal A-fib, hypertension who came to the ED complaining of shortness of breath, gradually worsening over the past 4 days.  Patient was seen at urgent care 2 days ago and was given a Decadron injection without improvement.  Patient has been using her Cito pecks inhaler at home without relief.  Oxygen saturations at home dropping to 85% with ambulation.  On arrival to the ED patient did have a leukocytosis and increased respiratory rate patient was started on antibiotics.  Overnight; RN notified of CBG of 383 patient currently using insulin  pump.  Recheck in 1 hour which was 449.  Patient did receive recent doses of steroids which could be contributing to her hyperglycemia.   Interventions/ Plan   7 units once. Beta hydroxybutyratic acid Redraw Cg  in 1 hour     0430- redraw was 345 repeated a random glucose at 0308 which was 305. Ordered 5 units novolog  once and a repeat bg at 0600. Suggest SSI during steroid use.    Kwasi Joung Donati- Aram BSN RN CCRN AGACNP-BC Acute Care Nurse Practitioner [facility]

## 2024-01-16 NOTE — Hospital Course (Signed)
 Hospital course / significant events:   HPI: Lisa [name] is a 73 y.o. female with a history of diabetes, paroxysmal atrial fibrillation, hypertension who comes ED complaining of shortness of breath, gradually worsening for the past 4 days.  Went to urgent care 2 days ago, was given Decadron injection without improvement.  Also has tried Xopenex  inhaler at home without relief.  Reports seeing pulmonology in the past, having PFTs done which were negative for chronic lung disease.  Denies history of asthma or home oxygen use. Patient notes severe dyspnea on exertion at home, oxygen saturation dropping to 85% with ambulation of only a few steps. Came to the ED     12/15: ED course: RR 21, WBC 17 meeting sepsis criteria but only just since RR normalized quickly. SpO2 90% RA, improved on 4L O2.  CTA chest, (+)scattered PNA on LUL, no PE. Levaquin  started given Cef/PCN severe allergy history. Admitted to hospitalist to continue abx and O2 12/16: remains on O2 w/ significant SOB and desaturation on ambulation      Consultants:  none  Procedures/Surgeries: none      ASSESSMENT & PLAN:    Sepsis d/t community acquired pneumonia Acute hypoxic respiratory failure  No hx COPD/asthma, no hx respiratory exposure BCx never drawn, monitor sepsis parameters but all are improving, still some leukocytosis but this is trending better  Levaquin  O2 w/ daily assessment w/ ambulation  Xopenex  nebs scheduled and prn (albuterol  causes tachycardia/Afib)  Ipratropium nebs scheduled    Essential HTN Borderline low BP Hold losartan   Maintain cardizem     Type 1 DM on insulin  pump Pt can maintain her own insulin     HLD Statin         No concerns based on BMI: Body mass index is 22.13 kg/m.SABRA Significantly low or high BMI is associated with higher medical risk.  Underweight - under 18  overweight - 25 to 29 obese - 30 or more Class 1 obesity: BMI of 30.0 to 34 Class 2 obesity: BMI of 35.0 to  39 Class 3 obesity: BMI of 40.0 to 49 Super Morbid Obesity: BMI 50-59 Super-super Morbid Obesity: BMI 60+ Healthy nutrition and physical activity advised as adjunct to other disease management and risk reduction treatments    DVT prophylaxis: lovenox  IV fluids: no continuous IV fluids  Nutrition: carb diet Central lines / other devices: insulinpump  Code Status: DNR ACP documentation reviewed: none on file in [facility]  TOC needs: TBD may need HH but unlikely to need SNF< may need home O2 Medical barriers to dispo: O2 requirement. Expected medical readiness for discharge 1-2 days or home w/ oxygenif improving .

## 2024-01-17 LAB — GLUCOSE, CAPILLARY
Glucose-Capillary: 91 mg/dL (ref 70–99)
Glucose-Capillary: 175 mg/dL — ABNORMAL HIGH (ref 70–99)
Glucose-Capillary: 175 mg/dL — ABNORMAL HIGH (ref 70–99)
Glucose-Capillary: 271 mg/dL — ABNORMAL HIGH (ref 70–99)
Glucose-Capillary: 380 mg/dL — ABNORMAL HIGH (ref 70–99)

## 2024-01-17 LAB — BASIC METABOLIC PANEL WITH GFR
Anion gap: 8 (ref 5–15)
BUN: 16 mg/dL (ref 8–23)
CO2: 28 mmol/L (ref 22–32)
Calcium: 9 mg/dL (ref 8.9–10.3)
Chloride: 98 mmol/L (ref 98–111)
Creatinine, Ser: 0.71 mg/dL (ref 0.44–1.00)
GFR, Estimated: >60 mL/min (ref >60)
Glucose, Bld: 153 mg/dL — ABNORMAL HIGH (ref 70–99)
Potassium: 4.3 mmol/L (ref 3.5–5.1)
Sodium: 134 mmol/L — ABNORMAL LOW (ref 135–145)

## 2024-01-17 LAB — RESPIRATORY PANEL BY PCR

## 2024-01-17 LAB — CBC
HCT: 36.1 % (ref 36.0–46.0)
Hemoglobin: 12.2 g/dL (ref 12.0–15.0)
MCH: 27.5 pg (ref 26.0–34.0)
MCHC: 33.8 g/dL (ref 30.0–36.0)
MCV: 81.5 fL (ref 80.0–100.0)
Platelets: 265 K/uL (ref 150–400)
RBC: 4.43 MIL/uL (ref 3.87–5.11)
RDW: 14.3 % (ref 11.5–15.5)
WBC: 15.8 K/uL — ABNORMAL HIGH (ref 4.0–10.5)
nRBC: 0 % (ref 0.0–0.2)

## 2024-01-17 MED ORDER — INSULIN ASPART 100 UNIT/ML IJ SOLN
5.0000 [IU] | Freq: Once | INTRAMUSCULAR | Status: AC
  Administered 2024-01-17: 5 [IU] via SUBCUTANEOUS
  Filled 2024-01-17: qty 5

## 2024-01-17 MED ORDER — PREDNISONE 20 MG PO TABS
40.0000 mg | ORAL_TABLET | Freq: Every day | ORAL | Status: DC
  Administered 2024-01-17 – 2024-01-18 (×2): 40 mg via ORAL
  Filled 2024-01-17 (×2): qty 2

## 2024-01-17 MED ORDER — IPRATROPIUM BROMIDE 0.02 % IN SOLN
0.5000 mg | Freq: Two times a day (BID) | RESPIRATORY_TRACT | Status: DC
  Administered 2024-01-17 – 2024-01-18 (×2): 0.5 mg via RESPIRATORY_TRACT
  Filled 2024-01-17 (×2): qty 2.5

## 2024-01-17 MED ORDER — LEVALBUTEROL HCL 1.25 MG/0.5ML IN NEBU: 1.2500 mg | INHALATION_SOLUTION | Freq: Two times a day (BID) | RESPIRATORY_TRACT | Status: DC

## 2024-01-17 MED ORDER — INSULIN ASPART 100 UNIT/ML IJ SOLN: 0.0000 [IU] | Freq: Three times a day (TID) | INTRAMUSCULAR | Status: DC

## 2024-01-17 NOTE — H&P (Deleted)
 HISTORY AND PHYSICAL    ILEE [name]   FMW:[id number] DOB: 1950-02-03   Date of Service: 01/17/2024 Requesting physician/APP from ED: Treatment Team:  Attending Provider: Mcarthur Pick, MD  PCP: Abbey Bruckner, MD     Brief summary: Lisa [name] is a 73 y.o. female with a history of diabetes, paroxysmal atrial fibrillation, hypertension who comes ED complaining of shortness of breath, worsening.  Few days prior to admission.  She initially went to urgent care and was given Decadron.  Reportedly she has had issues recently and took antibiotics as well as a steroid course in the past.  She has also seen pulmonology in the past, patient reports he had PFT and was told everything is fine Patient denies history of asthma or COPD use. Hypoxic in the ED. ED course: RR 21, WBC 17 meeting sepsis criteria but only just since RR normalized quickly. SpO2 90% RA, improved on 4L O2.  CTA chest, (+)scattered PNA on LUL, no PE. Levaquin  started given Cef/PCN severe allergy history.  Despite about 48 hours of IV antibiotics, patient continues to have symptoms, she remains on supplemental oxygen.  COVID-19, influenza A, influenza B, RSV negative.  Procalcitonin is negative less than 0.10    Consultants:  none  Procedures: none      ASSESSMENT & PLAN:   Sepsis d/t community acquired pneumonia Acute hypoxic respiratory failure  CT with scattered opacity left lung concerning for pneumonia, no PE.   COVID-19, influenza A, influenza B, RSV negative.  Will obtain full viral respiratory panel.  Interestingly procalcitonin is negative however will continue IV Levaquin , for now.  Blood cultures were not obtained on admission. Discussed about getting echocardiogram to rule out CHF however patient would like to wait until tomorrow to see if she responds with current therapy Steroids added. Xopenex  nebs scheduled and prn (albuterol  causes tachycardia/Afib)  Ipratropium nebs scheduled    Essential HTN Borderline low BP Hold losartan   Maintain cardizem    Type 1 DM on insulin  pump Pt can maintain her own insulin    HLD Statin        DVT prophylaxis: lovenox  Pertinent IV fluids/nutrition: no IV fluids, carb diet  Central lines / invasive devices: none  Code Status: DNR -patient confirmed DNR/intubation on admission   family Communication: No family members at the bedside Disposition: observation, med/surg  TOC needs: TBD expect none  Barriers to discharge / significant pending items: O2 requirement      has a past medical history of AAA (abdominal aortic aneurysm), Atrial fibrillation with rapid ventricular response (HCC), CAD (coronary artery disease), CHF (congestive heart failure) (HCC), Grade II diastolic dysfunction, Groin hematoma, High cholesterol, History of CHF (congestive heart failure) (10/19/2023), History of kidney stones, Hypertension, Kidney stone, Left ureteral stone, Mucus plugging of bronchi, Non-ST elevation (NSTEMI) myocardial infarction (HCC) (10/12/2016), NSTEMI (non-ST elevated myocardial infarction) (HCC) (10/11/2016), PAF (paroxysmal atrial fibrillation) (HCC), Paroxysmal atrial fibrillation (HCC), Pericardial effusion, Pneumonia (02/2016), Pneumonia due to infectious organism (11/25/2016), PVD (peripheral vascular disease), Shock liver (10/13/2016), Type I diabetes mellitus (HCC), and Ventricular fibrillation (HCC). Show/hide medication list[1]  Allergies[2]    family history includes Aortic aneurysm in her father; Hypertension in her brother and mother. Past Surgical History:  Procedure Laterality Date   ABDOMINAL AORTOGRAM N/A 10/11/2016   Procedure: ABDOMINAL AORTOGRAM;  Surgeon: Mady Bruckner, MD;  Location: [facility];  Service: Cardiovascular;  Laterality: N/A;   CARDIAC CATHETERIZATION  10/2016   CATARACT EXTRACTION W/PHACO Right 11/16/2022   Procedure: CATARACT  EXTRACTION PHACO AND INTRAOCULAR LENS PLACEMENT  (IOC) RIGHT DIABETIC 7.94 00:46.4;  Surgeon: Mittie Gaskin, MD;  Location: [facility];  Service: Ophthalmology;  Laterality: Right;   CATARACT EXTRACTION W/PHACO Left 11/30/2022   Procedure: CATARACT EXTRACTION PHACO AND INTRAOCULAR LENS PLACEMENT (IOC) LEFT DIABETIC;  Surgeon: Mittie Gaskin, MD;  Location: [facility];  Service: Ophthalmology;  Laterality: Left;  7.40 0:41.1   CERVICAL BIOPSY  W/ LOOP ELECTRODE EXCISION  03/05/2012   COLONOSCOPY     COLONOSCOPY WITH PROPOFOL  N/A 06/27/2018   Procedure: COLONOSCOPY WITH PROPOFOL ;  Surgeon: Dessa Reyes ORN, MD;  Location: [facility];  Service: Endoscopy;  Laterality: N/A;   COLONOSCOPY WITH PROPOFOL  N/A 03/24/2022   Procedure: COLONOSCOPY WITH PROPOFOL ;  Surgeon: Jinny Carmine, MD;  Location: [facility];  Service: Endoscopy;  Laterality: N/A;   COLPOSCOPY  01/17/2011   CORONARY ANGIOPLASTY WITH STENT PLACEMENT  1999; 12/02/2016   1 stent  + 1 stent   CORONARY ATHERECTOMY N/A 12/02/2016   Procedure: CORONARY ATHERECTOMY - CSI;  Surgeon: Mady Bruckner, MD;  Location: [facility];  Service: Cardiovascular;  Laterality: N/A;   CORONARY STENT INTERVENTION N/A 12/02/2016   Procedure: CORONARY STENT INTERVENTION;  Surgeon: Mady Bruckner, MD;  Location: [facility];  Service: Cardiovascular;  Laterality: N/A;   LEFT HEART CATH AND CORONARY ANGIOGRAPHY N/A 10/11/2016   Procedure: LEFT HEART CATH AND CORONARY ANGIOGRAPHY;  Surgeon: Mady Bruckner, MD;  Location: [facility];  Service: Cardiovascular;  Laterality: N/A;   NASAL SINUS SURGERY  ~ 2009   RECONSTRUCTION OF EYELID     TEMPORARY PACEMAKER N/A 12/02/2016   Procedure: TEMPORARY PACEMAKER;  Surgeon: Mady Bruckner, MD;  Location: [facility];  Service: Cardiovascular;  Laterality: N/A;   TONSILLECTOMY            Objective Findings:  Vitals:   01/16/24 2007 01/17/24 0345 01/17/24 0758 01/17/24 0838  BP:   130/68  117/67  Pulse:  63  71  Resp:  16  18  Temp:  97.9 F (36.6 C)  98.3 F (36.8 C)  TempSrc:      SpO2: 92% 97% 94% 97%  Weight:      Height:        Intake/Output Summary (Last 24 hours) at 01/17/2024 1209 Last data filed at 01/17/2024 0900 Gross per 24 hour  Intake 480 ml  Output --  Net 480 ml   Filed Weights   01/15/24 1317  Weight: 54.9 kg    Examination:  General: Alert, oriented not in any acute distress Chest: Diminished bilaterally, bilateral crackles CVS: S1, S2, no murmur, regular rhythm Abdomen: Soft, nontender Extremities: No edema      Scheduled Medications:   aspirin  EC  81 mg Oral Daily   atorvastatin   40 mg Oral Daily   diltiazem   240 mg Oral Daily   enoxaparin  (LOVENOX ) injection  40 mg Subcutaneous Q24H   insulin  pump   Subcutaneous Q4H   ipratropium  0.5 mg Nebulization TID   levalbuterol   1.25 mg Nebulization TID   predniSONE   40 mg Oral Q breakfast    Continuous Infusions:  levofloxacin  (LEVAQUIN ) IV 750 mg (01/16/24 1707)    PRN Medications:  levalbuterol   Antimicrobials:  Anti-infectives (From admission, onward)    Start     Dose/Rate Route Frequency Ordered Stop   01/16/24 1745  azithromycin  (ZITHROMAX ) 250 mg in dextrose  5 % 125 mL IVPB  Status:  Discontinued  250 mg 127.5 mL/hr over 60 Minutes Intravenous Every 24 hours 01/16/24 1659 01/16/24 1659   01/16/24 1700  levofloxacin  (LEVAQUIN ) IVPB 750 mg        750 mg 100 mL/hr over 90 Minutes Intravenous Every 24 hours 01/15/24 1745     01/15/24 1700  levofloxacin  (LEVAQUIN ) IVPB 750 mg        750 mg 100 mL/hr over 90 Minutes Intravenous  Once 01/15/24 1654 01/15/24 1843           Data Reviewed: I have personally reviewed following labs and imaging studies  CBC: Recent Labs  Lab 01/15/24 1320 01/16/24 0336 01/17/24 0539  WBC 17.7* 14.2* 15.8*  HGB 12.9 12.1 12.2  HCT 38.8 36.1 36.1  MCV 83.3 82.4 81.5  PLT 269 253 265   Basic Metabolic  Panel: Recent Labs  Lab 01/15/24 1320 01/16/24 0107 01/16/24 0336 01/17/24 0539  NA 131*  --  129* 134*  K 4.0  --  4.3 4.3  CL 95*  --  94* 98  CO2 24  --  23 28  GLUCOSE 231* 368* 289* 153*  BUN 20  --  21 16  CREATININE 0.68  --  0.69 0.71  CALCIUM  9.6  --  8.8* 9.0   GFR: Estimated Creatinine Clearance: 51 mL/min (by C-G formula based on SCr of 0.71 mg/dL). Liver Function Tests: No results for input(s): AST, ALT, ALKPHOS, BILITOT, PROT, ALBUMIN in the last 168 hours. No results for input(s): LIPASE, AMYLASE in the last 168 hours. No results for input(s): AMMONIA in the last 168 hours. Coagulation Profile: No results for input(s): INR, PROTIME in the last 168 hours. Cardiac Enzymes: No results for input(s): CKTOTAL, CKMB, CKMBINDEX, TROPONINI in the last 168 hours. BNP (last 3 results) No results for input(s): PROBNP in the last 8760 hours. HbA1C: No results for input(s): HGBA1C in the last 72 hours. CBG: Recent Labs  Lab 01/16/24 1935 01/16/24 2326 01/17/24 0333 01/17/24 0839 01/17/24 1204  GLUCAP 290* 277* 91 175* 175*   Lipid Profile: No results for input(s): CHOL, HDL, LDLCALC, TRIG, CHOLHDL, LDLDIRECT in the last 72 hours. Thyroid  Function Tests: No results for input(s): TSH, T4TOTAL, FREET4, T3FREE, THYROIDAB in the last 72 hours. Anemia Panel: No results for input(s): VITAMINB12, FOLATE, FERRITIN, TIBC, IRON, RETICCTPCT in the last 72 hours. Most Recent Urinalysis On File:     Component Value Date/Time   COLORURINE YELLOW 08/09/2019 1250   APPEARANCEUR CLOUDY (A) 08/09/2019 1250   APPEARANCEUR Clear 05/07/2019 0955   LABSPEC 1.010 08/09/2019 1250   PHURINE 6.5 08/09/2019 1250   GLUCOSEU NEGATIVE 08/09/2019 1250   HGBUR MODERATE (A) 08/09/2019 1250   BILIRUBINUR NEGATIVE 08/09/2019 1250   BILIRUBINUR Negative 05/07/2019 0955   KETONESUR NEGATIVE 08/09/2019 1250   PROTEINUR 30 (A)  08/09/2019 1250   NITRITE POSITIVE (A) 08/09/2019 1250   LEUKOCYTESUR LARGE (A) 08/09/2019 1250   Sepsis Labs: @LABRCNTIP (procalcitonin:4,lacticidven:4)  Recent Results (from the past 240 hours)  Resp panel by RT-PCR (RSV, Flu A&B, Covid) Anterior Nasal Swab     Status: None   Collection Time: 01/15/24  4:46 PM   Specimen: Anterior Nasal Swab  Result Value Ref Range Status   SARS Coronavirus 2 by RT PCR NEGATIVE NEGATIVE Final    Comment: (NOTE) SARS-CoV-2 target nucleic acids are NOT DETECTED.  The SARS-CoV-2 RNA is generally detectable in upper respiratory specimens during the acute phase of infection. The lowest concentration of SARS-CoV-2 viral copies this assay can detect is 138  copies/mL. A negative result does not preclude SARS-Cov-2 infection and should not be used as the sole basis for treatment or other patient management decisions. A negative result may occur with  improper specimen collection/handling, submission of specimen other than nasopharyngeal swab, presence of viral mutation(s) within the areas targeted by this assay, and inadequate number of viral copies(<138 copies/mL). A negative result must be combined with clinical observations, patient history, and epidemiological information. The expected result is Negative.  Fact Sheet for Patients:  bloggercourse.com  Fact Sheet for Healthcare Providers:  seriousbroker.it  This test is no t yet approved or cleared by the [place]  FDA and  has been authorized for detection and/or diagnosis of SARS-CoV-2 by FDA under an Emergency Use Authorization (EUA). This EUA will remain  in effect (meaning this test can be used) for the duration of the COVID-19 declaration under Section 564(b)(1) of the Act, 21 U.S.C.section 360bbb-3(b)(1), unless the authorization is terminated  or revoked sooner.       Influenza A by PCR NEGATIVE NEGATIVE Final   Influenza B by PCR  NEGATIVE NEGATIVE Final    Comment: (NOTE) The Xpert Xpress SARS-CoV-2/FLU/RSV plus assay is intended as an aid in the diagnosis of influenza from Nasopharyngeal swab specimens and should not be used as a sole basis for treatment. Nasal washings and aspirates are unacceptable for Xpert Xpress SARS-CoV-2/FLU/RSV testing.  Fact Sheet for Patients: bloggercourse.com  Fact Sheet for Healthcare Providers: seriousbroker.it  This test is not yet approved or cleared by the [place]  FDA and has been authorized for detection and/or diagnosis of SARS-CoV-2 by FDA under an Emergency Use Authorization (EUA). This EUA will remain in effect (meaning this test can be used) for the duration of the COVID-19 declaration under Section 564(b)(1) of the Act, 21 U.S.C. section 360bbb-3(b)(1), unless the authorization is terminated or revoked.     Resp Syncytial Virus by PCR NEGATIVE NEGATIVE Final    Comment: (NOTE) Fact Sheet for Patients: bloggercourse.com  Fact Sheet for Healthcare Providers: seriousbroker.it  This test is not yet approved or cleared by the [place]  FDA and has been authorized for detection and/or diagnosis of SARS-CoV-2 by FDA under an Emergency Use Authorization (EUA). This EUA will remain in effect (meaning this test can be used) for the duration of the COVID-19 declaration under Section 564(b)(1) of the Act, 21 U.S.C. section 360bbb-3(b)(1), unless the authorization is terminated or revoked.  Performed at [facility], [address], [place], [place] [address]          Radiology Studies: CT Angio Chest PE W and/or Wo Contrast Result Date: 01/15/2024 CLINICAL DATA:  Shortness of breath. Concern for pulmonary embolism. EXAM: CT ANGIOGRAPHY CHEST WITH CONTRAST TECHNIQUE: Multidetector CT imaging of the chest was performed using the standard protocol  during bolus administration of intravenous contrast. Multiplanar CT image reconstructions and MIPs were obtained to evaluate the vascular anatomy. RADIATION DOSE REDUCTION: This exam was performed according to the departmental dose-optimization program which includes automated exposure control, adjustment of the mA and/or kV according to patient size and/or use of iterative reconstruction technique. CONTRAST:  75mL OMNIPAQUE  IOHEXOL  350 MG/ML SOLN COMPARISON:  Chest CT dated 12/08/2016. FINDINGS: Cardiovascular: No cardiomegaly or pericardial effusion. Advanced 3 vessel coronary vascular calcification. Moderate atherosclerotic calcification of the thoracic aorta. No aneurysmal dilatation. Evaluation of the pulmonary arteries is limited due to respiratory motion. No pulmonary artery embolus identified. Mediastinum/Nodes: No hilar or mediastinal adenopathy. The esophagus is grossly unremarkable. No mediastinal fluid collection.  Lungs/Pleura: Scattered small clusters of ground-glass density primarily in the left upper lobe suspicious for pneumonia, possibly atypical in etiology. Bilateral linear atelectasis/scarring. No pleural effusion pneumothorax. The central airways are patent. Upper Abdomen: No acute abnormality. Musculoskeletal: No acute osseous pathology. Review of the MIP images confirms the above findings. IMPRESSION: 1. No CT evidence of pulmonary embolism. 2. Scattered small clusters of ground-glass density primarily in the left upper lobe suspicious for pneumonia, possibly atypical in etiology. 3.  Aortic Atherosclerosis (ICD10-I70.0). Electronically Signed   By: Vanetta Chou M.D.   On: 01/15/2024 16:23   DG Chest 2 View Result Date: 01/15/2024 EXAM: 2 VIEW(S) XRAY OF THE CHEST 01/15/2024 01:43:46 PM COMPARISON: 11/17/2023 CLINICAL HISTORY: sob FINDINGS: LUNGS AND PLEURA: No focal pulmonary opacity. No pleural effusion. No pneumothorax. HEART AND MEDIASTINUM: Atherosclerotic plaque. No acute  abnormality of the cardiac and mediastinal silhouettes. BONES AND SOFT TISSUES: No acute osseous abnormality. IMPRESSION: 1. No acute cardiopulmonary process. Electronically signed by: Waddell Calk MD 01/15/2024 01:58 PM EST RP Workstation: [id number]             LOS: 1 day     Siani Utke, DO [facility] 01/17/2024, 12:09 PM    Dictation software may have been used to generate the above note. Typos may occur and escape review in typed/dictated notes. Please contact Dr Marsa directly for clarity if needed.  Staff may message me via secure chat in Epic  but this may not receive an immediate response,  please page me for urgent matters!  If 7PM-7AM, please contact night coverage [email or web address]           [1] (Not in an outpatient encounter)  [2]  Allergies Allergen Reactions   Ceftriaxone  Other (See Comments)    Anaphylaxis, cardiac arrest   Penicillins Anaphylaxis   Rocephin  [Ceftriaxone  Sodium In Dextrose ] Anaphylaxis   Albuterol  Other (See Comments)    Tachycardia-->Afib. Tolerates Xopenex 

## 2024-01-17 NOTE — Progress Notes (Signed)
 PROGRESS NOTE    [name]  FMW:[id number] DOB: 1950/10/17 DOA: 01/15/2024 PCP: Abbey Bruckner, MD   Brief Narrative:   Lisa [name] is a 73 y.o. female with a history of diabetes, paroxysmal atrial fibrillation, hypertension who comes ED complaining of shortness of breath, worsening.  Few days prior to admission.  She initially went to urgent care and was given Decadron.  Reportedly she has had issues recently and took antibiotics as well as a steroid course in the past.  She has also seen pulmonology in the past, patient reports he had PFT and was told everything is fine Patient denies history of asthma or COPD use. Hypoxic in the ED. ED course: RR 21, WBC 17 meeting sepsis criteria but only just since RR normalized quickly. SpO2 90% RA, improved on 4L O2.  CTA chest, (+)scattered PNA on LUL, no PE. Levaquin  started given Cef/PCN severe allergy history.  Despite about 48 hours of IV antibiotics, patient continues to have symptoms, she remains on supplemental oxygen.  COVID-19, influenza A, influenza B, RSV negative.  Procalcitonin is negative less than 0.10       Consultants:  none   Procedures: none           ASSESSMENT & PLAN:   Sepsis d/t community acquired pneumonia Acute hypoxic respiratory failure  CT with scattered opacity left lung concerning for pneumonia, no PE.   COVID-19, influenza A, influenza B, RSV negative.  Will obtain full viral respiratory panel.  Interestingly procalcitonin is negative however will continue IV Levaquin , for now.  Blood cultures were not obtained on admission. Discussed about getting echocardiogram to rule out CHF however patient would like to wait until tomorrow to see if she responds with current therapy Steroids added. Xopenex  nebs scheduled and prn (albuterol  causes tachycardia/Afib)  Ipratropium nebs scheduled    Essential HTN Borderline low BP Hold losartan   Maintain cardizem     Type 1 DM on insulin  pump Pt can maintain  her own insulin     HLD Statin              DVT prophylaxis: lovenox  Pertinent IV fluids/nutrition: no IV fluids, carb diet  Central lines / invasive devices: none   Code Status: DNR -patient confirmed DNR/intubation on admission    family Communication: No family members at the bedside Disposition: observation, med/surg  TOC needs: TBD expect none  Barriers to discharge / significant pending items: O2 requirement    Assessment & Plan:   Principal Problem:   Pneumonia    Subjective:   Objective: Vitals:   01/16/24 2007 01/17/24 0345 01/17/24 0758 01/17/24 0838  BP:  130/68  117/67  Pulse:  63  71  Resp:  16  18  Temp:  97.9 F (36.6 C)  98.3 F (36.8 C)  TempSrc:      SpO2: 92% 97% 94% 97%  Weight:      Height:        Intake/Output Summary (Last 24 hours) at 01/17/2024 1330 Last data filed at 01/17/2024 0900 Gross per 24 hour  Intake 480 ml  Output --  Net 480 ml   Filed Weights   01/15/24 1317  Weight: 54.9 kg    Examination:  Examination:  General: Alert, oriented not in any acute distress Chest: Diminished bilaterally, bilateral crackles CVS: S1, S2, no murmur, regular rhythm Abdomen: Soft, nontender Extremities: No edema       Data Reviewed: I have personally reviewed following labs and imaging studies  CBC: Recent  Labs  Lab 01/15/24 1320 01/16/24 0336 01/17/24 0539  WBC 17.7* 14.2* 15.8*  HGB 12.9 12.1 12.2  HCT 38.8 36.1 36.1  MCV 83.3 82.4 81.5  PLT 269 253 265   Basic Metabolic Panel: Recent Labs  Lab 01/15/24 1320 01/16/24 0107 01/16/24 0336 01/17/24 0539  NA 131*  --  129* 134*  K 4.0  --  4.3 4.3  CL 95*  --  94* 98  CO2 24  --  23 28  GLUCOSE 231* 368* 289* 153*  BUN 20  --  21 16  CREATININE 0.68  --  0.69 0.71  CALCIUM  9.6  --  8.8* 9.0   GFR: Estimated Creatinine Clearance: 51 mL/min (by C-G formula based on SCr of 0.71 mg/dL). Liver Function Tests: No results for input(s): AST, ALT, ALKPHOS,  BILITOT, PROT, ALBUMIN in the last 168 hours. No results for input(s): LIPASE, AMYLASE in the last 168 hours. No results for input(s): AMMONIA in the last 168 hours. Coagulation Profile: No results for input(s): INR, PROTIME in the last 168 hours. Cardiac Enzymes: No results for input(s): CKTOTAL, CKMB, CKMBINDEX, TROPONINI in the last 168 hours. BNP (last 3 results) No results for input(s): PROBNP in the last 8760 hours. HbA1C: No results for input(s): HGBA1C in the last 72 hours. CBG: Recent Labs  Lab 01/16/24 1935 01/16/24 2326 01/17/24 0333 01/17/24 0839 01/17/24 1204  GLUCAP 290* 277* 91 175* 175*   Lipid Profile: No results for input(s): CHOL, HDL, LDLCALC, TRIG, CHOLHDL, LDLDIRECT in the last 72 hours. Thyroid  Function Tests: No results for input(s): TSH, T4TOTAL, FREET4, T3FREE, THYROIDAB in the last 72 hours. Anemia Panel: No results for input(s): VITAMINB12, FOLATE, FERRITIN, TIBC, IRON, RETICCTPCT in the last 72 hours. Sepsis Labs: Recent Labs  Lab 01/15/24 1646  PROCALCITON <0.10    Recent Results (from the past 240 hours)  Resp panel by RT-PCR (RSV, Flu A&B, Covid) Anterior Nasal Swab     Status: None   Collection Time: 01/15/24  4:46 PM   Specimen: Anterior Nasal Swab  Result Value Ref Range Status   SARS Coronavirus 2 by RT PCR NEGATIVE NEGATIVE Final    Comment: (NOTE) SARS-CoV-2 target nucleic acids are NOT DETECTED.  The SARS-CoV-2 RNA is generally detectable in upper respiratory specimens during the acute phase of infection. The lowest concentration of SARS-CoV-2 viral copies this assay can detect is 138 copies/mL. A negative result does not preclude SARS-Cov-2 infection and should not be used as the sole basis for treatment or other patient management decisions. A negative result may occur with  improper specimen collection/handling, submission of specimen other than nasopharyngeal  swab, presence of viral mutation(s) within the areas targeted by this assay, and inadequate number of viral copies(<138 copies/mL). A negative result must be combined with clinical observations, patient history, and epidemiological information. The expected result is Negative.  Fact Sheet for Patients:  bloggercourse.com  Fact Sheet for Healthcare Providers:  seriousbroker.it  This test is no t yet approved or cleared by the [place]  FDA and  has been authorized for detection and/or diagnosis of SARS-CoV-2 by FDA under an Emergency Use Authorization (EUA). This EUA will remain  in effect (meaning this test can be used) for the duration of the COVID-19 declaration under Section 564(b)(1) of the Act, 21 U.S.C.section 360bbb-3(b)(1), unless the authorization is terminated  or revoked sooner.       Influenza A by PCR NEGATIVE NEGATIVE Final   Influenza B by PCR NEGATIVE NEGATIVE Final  Comment: (NOTE) The Xpert Xpress SARS-CoV-2/FLU/RSV plus assay is intended as an aid in the diagnosis of influenza from Nasopharyngeal swab specimens and should not be used as a sole basis for treatment. Nasal washings and aspirates are unacceptable for Xpert Xpress SARS-CoV-2/FLU/RSV testing.  Fact Sheet for Patients: bloggercourse.com  Fact Sheet for Healthcare Providers: seriousbroker.it  This test is not yet approved or cleared by the [place]  FDA and has been authorized for detection and/or diagnosis of SARS-CoV-2 by FDA under an Emergency Use Authorization (EUA). This EUA will remain in effect (meaning this test can be used) for the duration of the COVID-19 declaration under Section 564(b)(1) of the Act, 21 U.S.C. section 360bbb-3(b)(1), unless the authorization is terminated or revoked.     Resp Syncytial Virus by PCR NEGATIVE NEGATIVE Final    Comment: (NOTE) Fact Sheet for  Patients: bloggercourse.com  Fact Sheet for Healthcare Providers: seriousbroker.it  This test is not yet approved or cleared by the [place]  FDA and has been authorized for detection and/or diagnosis of SARS-CoV-2 by FDA under an Emergency Use Authorization (EUA). This EUA will remain in effect (meaning this test can be used) for the duration of the COVID-19 declaration under Section 564(b)(1) of the Act, 21 U.S.C. section 360bbb-3(b)(1), unless the authorization is terminated or revoked.  Performed at [facility], [address], [place], [place] [address]          Radiology Studies: CT Angio Chest PE W and/or Wo Contrast Result Date: 01/15/2024 CLINICAL DATA:  Shortness of breath. Concern for pulmonary embolism. EXAM: CT ANGIOGRAPHY CHEST WITH CONTRAST TECHNIQUE: Multidetector CT imaging of the chest was performed using the standard protocol during bolus administration of intravenous contrast. Multiplanar CT image reconstructions and MIPs were obtained to evaluate the vascular anatomy. RADIATION DOSE REDUCTION: This exam was performed according to the departmental dose-optimization program which includes automated exposure control, adjustment of the mA and/or kV according to patient size and/or use of iterative reconstruction technique. CONTRAST:  75mL OMNIPAQUE  IOHEXOL  350 MG/ML SOLN COMPARISON:  Chest CT dated 12/08/2016. FINDINGS: Cardiovascular: No cardiomegaly or pericardial effusion. Advanced 3 vessel coronary vascular calcification. Moderate atherosclerotic calcification of the thoracic aorta. No aneurysmal dilatation. Evaluation of the pulmonary arteries is limited due to respiratory motion. No pulmonary artery embolus identified. Mediastinum/Nodes: No hilar or mediastinal adenopathy. The esophagus is grossly unremarkable. No mediastinal fluid collection. Lungs/Pleura: Scattered small clusters of ground-glass density  primarily in the left upper lobe suspicious for pneumonia, possibly atypical in etiology. Bilateral linear atelectasis/scarring. No pleural effusion pneumothorax. The central airways are patent. Upper Abdomen: No acute abnormality. Musculoskeletal: No acute osseous pathology. Review of the MIP images confirms the above findings. IMPRESSION: 1. No CT evidence of pulmonary embolism. 2. Scattered small clusters of ground-glass density primarily in the left upper lobe suspicious for pneumonia, possibly atypical in etiology. 3.  Aortic Atherosclerosis (ICD10-I70.0). Electronically Signed   By: Vanetta Chou M.D.   On: 01/15/2024 16:23   DG Chest 2 View Result Date: 01/15/2024 EXAM: 2 VIEW(S) XRAY OF THE CHEST 01/15/2024 01:43:46 PM COMPARISON: 11/17/2023 CLINICAL HISTORY: sob FINDINGS: LUNGS AND PLEURA: No focal pulmonary opacity. No pleural effusion. No pneumothorax. HEART AND MEDIASTINUM: Atherosclerotic plaque. No acute abnormality of the cardiac and mediastinal silhouettes. BONES AND SOFT TISSUES: No acute osseous abnormality. IMPRESSION: 1. No acute cardiopulmonary process. Electronically signed by: Waddell Calk MD 01/15/2024 01:58 PM EST RP Workstation: [id number]        Scheduled Meds:  aspirin  EC  81 mg  Oral Daily   atorvastatin   40 mg Oral Daily   diltiazem   240 mg Oral Daily   enoxaparin  (LOVENOX ) injection  40 mg Subcutaneous Q24H   insulin  pump   Subcutaneous Q4H   ipratropium  0.5 mg Nebulization BID   predniSONE   40 mg Oral Q breakfast   Continuous Infusions:  levofloxacin  (LEVAQUIN ) IV 750 mg (01/16/24 1707)          Dayani Winbush, MD [facility] 01/17/2024, 1:30 PM

## 2024-01-18 DIAGNOSIS — B348 Other viral infections of unspecified site: Secondary | ICD-10-CM | POA: Diagnosis not present

## 2024-01-18 DIAGNOSIS — J189 Pneumonia, unspecified organism: Secondary | ICD-10-CM | POA: Diagnosis not present

## 2024-01-18 LAB — CBC WITH DIFFERENTIAL/PLATELET
Abs Immature Granulocytes: 0.19 K/uL — ABNORMAL HIGH (ref 0.00–0.07)
Basophils Absolute: 0 K/uL (ref 0.0–0.1)
Basophils Relative: 0 %
Eosinophils Absolute: 0 K/uL (ref 0.0–0.5)
Eosinophils Relative: 0 %
HCT: 37.1 % (ref 36.0–46.0)
Hemoglobin: 12.4 g/dL (ref 12.0–15.0)
Immature Granulocytes: 2 %
Lymphocytes Relative: 20 %
Lymphs Abs: 2.5 K/uL (ref 0.7–4.0)
MCH: 27.7 pg (ref 26.0–34.0)
MCHC: 33.4 g/dL (ref 30.0–36.0)
MCV: 83 fL (ref 80.0–100.0)
Monocytes Absolute: 1.4 K/uL — ABNORMAL HIGH (ref 0.1–1.0)
Monocytes Relative: 12 %
Neutro Abs: 8.3 K/uL — ABNORMAL HIGH (ref 1.7–7.7)
Neutrophils Relative %: 66 %
Platelets: 272 K/uL (ref 150–400)
RBC: 4.47 MIL/uL (ref 3.87–5.11)
RDW: 14.1 % (ref 11.5–15.5)
WBC: 12.5 K/uL — ABNORMAL HIGH (ref 4.0–10.5)
nRBC: 0 % (ref 0.0–0.2)

## 2024-01-18 LAB — BASIC METABOLIC PANEL WITH GFR
Anion gap: 9 (ref 5–15)
BUN: 17 mg/dL (ref 8–23)
CO2: 27 mmol/L (ref 22–32)
Calcium: 8.5 mg/dL — ABNORMAL LOW (ref 8.9–10.3)
Chloride: 96 mmol/L — ABNORMAL LOW (ref 98–111)
Creatinine, Ser: 0.66 mg/dL (ref 0.44–1.00)
GFR, Estimated: >60 mL/min (ref >60)
Glucose, Bld: 167 mg/dL — ABNORMAL HIGH (ref 70–99)
Potassium: 4.2 mmol/L (ref 3.5–5.1)
Sodium: 132 mmol/L — ABNORMAL LOW (ref 135–145)

## 2024-01-18 LAB — GLUCOSE, CAPILLARY
Glucose-Capillary: 160 mg/dL — ABNORMAL HIGH (ref 70–99)
Glucose-Capillary: 191 mg/dL — ABNORMAL HIGH (ref 70–99)
Glucose-Capillary: 255 mg/dL — ABNORMAL HIGH (ref 70–99)

## 2024-01-18 NOTE — TOC Transition Note (Signed)
 Transition of Care [facility]) - Discharge Note   Patient Details  Name: Lisa [name] MRN: [id number] Date of Birth: 1950/09/02  [facility] of [facility]) CM/SW Contact:  Corean ONEIDA Haddock, RN Phone Number: 01/18/2024, 10:05 AM   Clinical Narrative:     Patient has been weaned to RA prior to discharge, no IP Care Management needs identified         Patient Goals and CMS Choice            Discharge Placement                       Discharge Plan and Services Additional resources added to the After Visit Summary for                                       Social Drivers of Health (SDOH) Interventions SDOH Screenings   Food Insecurity: No Food Insecurity (01/16/2024)  Housing: Low Risk (01/16/2024)  Transportation Needs: No Transportation Needs (01/16/2024)  Utilities: Not At Risk (01/16/2024)  Alcohol Screen: Low Risk (11/20/2023)  Depression (PHQ2-9): Low Risk (11/20/2023)  Financial Resource Strain: Low Risk (11/20/2023)  Physical Activity: Sufficiently Active (11/20/2023)  Social Connections: Socially Integrated (01/16/2024)  Stress: No Stress Concern Present (11/20/2023)  Tobacco Use: Medium Risk (01/15/2024)  Health Literacy: Adequate Health Literacy (11/20/2023)     Readmission Risk Interventions     No data to display

## 2024-01-18 NOTE — Plan of Care (Signed)
 Discharge instructions reviewed, patient to discharge to home with husband.

## 2024-01-18 NOTE — Progress Notes (Signed)
 Ambulated patient in hall on room air.  Lowest O2 saturation was 90%, patient quickly recovered to 93%/  She has been advised to monitor her O2 level at home.  Patient understands and has pulse ox devise.SABRA

## 2024-01-18 NOTE — Discharge Summary (Signed)
 Physician Discharge Summary  HONORE WIPPERFURTH FMW:[id number] DOB: 1950-02-23 DOA: 01/15/2024  PCP: Abbey Bruckner, MD  Admit date: 01/15/2024 Discharge date: 01/18/2024  Admitted From: Home Disposition: Home Recommendations for Outpatient Follow-up:  Outpatient follow-up with PCP/pulmonology in 1 to 2 weeks Check CMP and CBC at follow-up Please follow up on the following pending results: None  Home Health: No need identified Equipment/Devices: No need identified  Discharge Condition: Stable CODE STATUS: Full code   Follow-up Information     Bair, Bruckner, MD. Schedule an appointment as soon as possible for a visit .   Specialty: Family Medicine Why: Appointment scheduled for 01/23/24 4:00PM Dr. Bruckner Pass information: [address] [place] [address] [phone]         [facility] Emergency Department at [facility] .   Specialty: Emergency Medicine Why: If symptoms worsen Contact information: [address] [place]  [address] [phone]                Hospital course 73 year old F with PMH of PAF, HTN and IDDM-2 presented to ED with worsening shortness of breath.  Initially presented to urgent care and was given Decadron injection.  In ED, stable vitals except for mild tachypnea.  WBC 17 with left shift.  Saturation 90% on RA and she was started on 4 L by nasal cannula.  COVID-19, influenza and RSV PCR nonreactive.  CT angio chest negative for PE but concerning for scattered infiltrate in LUL.  She was started on Levaquin  given allergies to cephalosporin and penicillin.  She was also started on steroid.  Despite 48 hours of antibiotics and steroid, patient continued to have shortness of breath and oxygen requirement.  Hence, RVP ordered and showed rhinovirus on 01/17/2024.  On the day of discharge, patient felt well.  Respiratory distress improved except for some cough.  Ambulated on room air and maintained  appropriate saturation without respiratory distress.  She had 4 days of Levaquin  and prednisone .  Given her improvement and underlying diabetes, we have decided not to continue steroid on discharge.  See individual problem list below for more.   Problems addressed during this hospitalization Sepsis d/t community acquired pneumonia and rhinovirus infection Acute hypoxic respiratory failure: Resolved.  Maintain appropriate saturation with ambulation on room air. -CT angio chest negative for PE but concerning for LUL infiltrate. -COVID-19, influenza and RSV PCR nonreactive. -RVP positive for rhinovirus. -Received Levaquin  and systemic steroid for 4 days. -Patient to continue home Xopenex  -Outpatient follow-up in 1 to 2 weeks.  Essential HTN: Normotensive. - Continue home meds.   Type 1 DM on insulin  pump -Continue home insulin    Hyperlipidemia - Continue on statin  Body mass index is 22.13 kg/m.           Consultations: None  Time spent 35  minutes  Vital signs Vitals:   01/17/24 1949 01/18/24 0404 01/18/24 0722 01/18/24 0746  BP: 121/63 121/73  133/71  Pulse: 80 70  65  Temp: 97.6 F (36.4 C) 97.7 F (36.5 C)  98 F (36.7 C)  Resp: 20 20  16   Height:      Weight:      SpO2: 93% 98% 94% 99%  TempSrc:  Oral    BMI (Calculated):         Discharge exam  GENERAL: No apparent distress.  Nontoxic. HEENT: MMM.  Vision and hearing grossly intact.  NECK: Supple.  No apparent JVD.  RESP:  No IWOB.  Fair aeration bilaterally. CVS:  RRR. Heart sounds normal.  ABD/GI/GU: BS+. Abd soft, NTND.  MSK/EXT:  Moves extremities. No apparent deformity. No edema.  SKIN: no apparent skin lesion or wound NEURO: Awake and alert. Oriented appropriately.  No apparent focal neuro deficit. PSYCH: Calm. Normal affect.   Discharge Instructions Discharge Instructions     Discharge instructions   Complete by: As directed    It has been a pleasure taking care of you!  You were  hospitalized due to pneumonia and rhinovirus infection.  Your symptoms improved.  Maintain good hydration.  Use incentive spirometry to exercise deep breathing.  Follow-up with your doctors in 1 to 2 weeks or sooner if needed.   Take care,   Increase activity slowly   Complete by: As directed       Allergies as of 01/18/2024       Reactions   Ceftriaxone  Other (See Comments)   Anaphylaxis, cardiac arrest   Penicillins Anaphylaxis   Rocephin  [ceftriaxone  Sodium In Dextrose ] Anaphylaxis   Albuterol  Other (See Comments)   Tachycardia-->Afib. Tolerates Xopenex         Medication List     TAKE these medications    ascorbic acid 500 MG tablet Commonly known as: VITAMIN C Take 1,000 mg by mouth daily.   aspirin  EC 81 MG tablet Take 81 mg by mouth daily.   atorvastatin  40 MG tablet Commonly known as: LIPITOR  Take 1 tablet (40 mg total) by mouth at bedtime.   Dexcom G6 Transmitter Misc by Does not apply route.   diltiazem  240 MG 24 hr capsule Commonly known as: CARDIZEM  CD TAKE 1 CAPSULE(240 MG) BY MOUTH DAILY   levalbuterol  45 MCG/ACT inhaler Commonly known as: XOPENEX  HFA Inhale 2 puffs into the lungs every 4 (four) hours as needed for wheezing or shortness of breath.   losartan  25 MG tablet Commonly known as: COZAAR  Take 1 tablet (25 mg total) by mouth daily.   nitroGLYCERIN  0.4 MG SL tablet Commonly known as: NITROSTAT  Place 1 tablet (0.4 mg total) under the tongue every 5 (five) minutes x 3 doses as needed for chest pain.   NovoLOG  100 UNIT/ML injection Generic drug: insulin  aspart Inject 0-35 Units into the skin daily. Uses with Insulin  Pump   Omnipod 5 DexG7G6 Pods Gen 5 Misc Inject into the skin.   OneTouch Verio test strip Generic drug: glucose blood USE TO TEST FOUR TIMES DAILY   PRESERVISION AREDS PO Take 1 tablet by mouth daily.         Procedures/Studies:   CT Angio Chest PE W and/or Wo Contrast Result Date: 01/15/2024 CLINICAL DATA:   Shortness of breath. Concern for pulmonary embolism. EXAM: CT ANGIOGRAPHY CHEST WITH CONTRAST TECHNIQUE: Multidetector CT imaging of the chest was performed using the standard protocol during bolus administration of intravenous contrast. Multiplanar CT image reconstructions and MIPs were obtained to evaluate the vascular anatomy. RADIATION DOSE REDUCTION: This exam was performed according to the departmental dose-optimization program which includes automated exposure control, adjustment of the mA and/or kV according to patient size and/or use of iterative reconstruction technique. CONTRAST:  75mL OMNIPAQUE  IOHEXOL  350 MG/ML SOLN COMPARISON:  Chest CT dated 12/08/2016. FINDINGS: Cardiovascular: No cardiomegaly or pericardial effusion. Advanced 3 vessel coronary vascular calcification. Moderate atherosclerotic calcification of the thoracic aorta. No aneurysmal dilatation. Evaluation of the pulmonary arteries is limited due to respiratory motion. No pulmonary artery embolus identified. Mediastinum/Nodes: No hilar or mediastinal adenopathy. The esophagus is grossly unremarkable. No mediastinal fluid collection. Lungs/Pleura: Scattered small clusters of ground-glass  density primarily in the left upper lobe suspicious for pneumonia, possibly atypical in etiology. Bilateral linear atelectasis/scarring. No pleural effusion pneumothorax. The central airways are patent. Upper Abdomen: No acute abnormality. Musculoskeletal: No acute osseous pathology. Review of the MIP images confirms the above findings. IMPRESSION: 1. No CT evidence of pulmonary embolism. 2. Scattered small clusters of ground-glass density primarily in the left upper lobe suspicious for pneumonia, possibly atypical in etiology. 3.  Aortic Atherosclerosis (ICD10-I70.0). Electronically Signed   By: Vanetta Chou M.D.   On: 01/15/2024 16:23   DG Chest 2 View Result Date: 01/15/2024 EXAM: 2 VIEW(S) XRAY OF THE CHEST 01/15/2024 01:43:46 PM COMPARISON:  11/17/2023 CLINICAL HISTORY: sob FINDINGS: LUNGS AND PLEURA: No focal pulmonary opacity. No pleural effusion. No pneumothorax. HEART AND MEDIASTINUM: Atherosclerotic plaque. No acute abnormality of the cardiac and mediastinal silhouettes. BONES AND SOFT TISSUES: No acute osseous abnormality. IMPRESSION: 1. No acute cardiopulmonary process. Electronically signed by: Waddell Calk MD 01/15/2024 01:58 PM EST RP Workstation: [id number]       The results of significant diagnostics from this hospitalization (including imaging, microbiology, ancillary and laboratory) are listed below for reference.     Microbiology: Recent Results (from the past 240 hours)  Resp panel by RT-PCR (RSV, Flu A&B, Covid) Anterior Nasal Swab     Status: None   Collection Time: 01/15/24  4:46 PM   Specimen: Anterior Nasal Swab  Result Value Ref Range Status   SARS Coronavirus 2 by RT PCR NEGATIVE NEGATIVE Final    Comment: (NOTE) SARS-CoV-2 target nucleic acids are NOT DETECTED.  The SARS-CoV-2 RNA is generally detectable in upper respiratory specimens during the acute phase of infection. The lowest concentration of SARS-CoV-2 viral copies this assay can detect is 138 copies/mL. A negative result does not preclude SARS-Cov-2 infection and should not be used as the sole basis for treatment or other patient management decisions. A negative result may occur with  improper specimen collection/handling, submission of specimen other than nasopharyngeal swab, presence of viral mutation(s) within the areas targeted by this assay, and inadequate number of viral copies(<138 copies/mL). A negative result must be combined with clinical observations, patient history, and epidemiological information. The expected result is Negative.  Fact Sheet for Patients:  bloggercourse.com  Fact Sheet for Healthcare Providers:  seriousbroker.it  This test is no t yet approved or cleared  by the [place]  FDA and  has been authorized for detection and/or diagnosis of SARS-CoV-2 by FDA under an Emergency Use Authorization (EUA). This EUA will remain  in effect (meaning this test can be used) for the duration of the COVID-19 declaration under Section 564(b)(1) of the Act, 21 U.S.C.section 360bbb-3(b)(1), unless the authorization is terminated  or revoked sooner.       Influenza A by PCR NEGATIVE NEGATIVE Final   Influenza B by PCR NEGATIVE NEGATIVE Final    Comment: (NOTE) The Xpert Xpress SARS-CoV-2/FLU/RSV plus assay is intended as an aid in the diagnosis of influenza from Nasopharyngeal swab specimens and should not be used as a sole basis for treatment. Nasal washings and aspirates are unacceptable for Xpert Xpress SARS-CoV-2/FLU/RSV testing.  Fact Sheet for Patients: bloggercourse.com  Fact Sheet for Healthcare Providers: seriousbroker.it  This test is not yet approved or cleared by the [place]  FDA and has been authorized for detection and/or diagnosis of SARS-CoV-2 by FDA under an Emergency Use Authorization (EUA). This EUA will remain in effect (meaning this test can be used) for the duration of the COVID-19 declaration under  Section 564(b)(1) of the Act, 21 U.S.C. section 360bbb-3(b)(1), unless the authorization is terminated or revoked.     Resp Syncytial Virus by PCR NEGATIVE NEGATIVE Final    Comment: (NOTE) Fact Sheet for Patients: bloggercourse.com  Fact Sheet for Healthcare Providers: seriousbroker.it  This test is not yet approved or cleared by the [place]  FDA and has been authorized for detection and/or diagnosis of SARS-CoV-2 by FDA under an Emergency Use Authorization (EUA). This EUA will remain in effect (meaning this test can be used) for the duration of the COVID-19 declaration under Section 564(b)(1) of the Act, 21  U.S.C. section 360bbb-3(b)(1), unless the authorization is terminated or revoked.  Performed at [facility], [address] [place], [place] [address]   Respiratory (~20 pathogens) panel by PCR     Status: Abnormal   Collection Time: 01/17/24 12:21 PM   Specimen: Nasopharyngeal Swab; Respiratory  Result Value Ref Range Status   Adenovirus NOT DETECTED NOT DETECTED Final   Coronavirus 229E NOT DETECTED NOT DETECTED Final    Comment: (NOTE) The Coronavirus on the Respiratory Panel, DOES NOT test for the novel  Coronavirus (2019 nCoV)    Coronavirus HKU1 NOT DETECTED NOT DETECTED Final   Coronavirus NL63 NOT DETECTED NOT DETECTED Final   Coronavirus OC43 NOT DETECTED NOT DETECTED Final   Metapneumovirus NOT DETECTED NOT DETECTED Final   Rhinovirus / Enterovirus DETECTED (A) NOT DETECTED Final   Influenza A NOT DETECTED NOT DETECTED Final   Influenza B NOT DETECTED NOT DETECTED Final   Parainfluenza Virus 1 NOT DETECTED NOT DETECTED Final   Parainfluenza Virus 2 NOT DETECTED NOT DETECTED Final   Parainfluenza Virus 3 NOT DETECTED NOT DETECTED Final   Parainfluenza Virus 4 NOT DETECTED NOT DETECTED Final   Respiratory Syncytial Virus NOT DETECTED NOT DETECTED Final   Bordetella pertussis NOT DETECTED NOT DETECTED Final   Bordetella Parapertussis NOT DETECTED NOT DETECTED Final   Chlamydophila pneumoniae NOT DETECTED NOT DETECTED Final   Mycoplasma pneumoniae NOT DETECTED NOT DETECTED Final    Comment: Performed at [facility], [address], [place], [place] [address]     Labs:  CBC: Recent Labs  Lab 01/15/24 1320 01/16/24 0336 01/17/24 0539 01/18/24 0441  WBC 17.7* 14.2* 15.8* 12.5*  NEUTROABS  --   --   --  8.3*  HGB 12.9 12.1 12.2 12.4  HCT 38.8 36.1 36.1 37.1  MCV 83.3 82.4 81.5 83.0  PLT 269 253 265 272   BMP &GFR Recent Labs  Lab 01/15/24 1320 01/16/24 0107 01/16/24 0336 01/17/24 0539 01/18/24 0441  NA 131*  --  129* 134* 132*  K 4.0   --  4.3 4.3 4.2  CL 95*  --  94* 98 96*  CO2 24  --  23 28 27   GLUCOSE 231* 368* 289* 153* 167*  BUN 20  --  21 16 17   CREATININE 0.68  --  0.69 0.71 0.66  CALCIUM  9.6  --  8.8* 9.0 8.5*   Estimated Creatinine Clearance: 51 mL/min (by C-G formula based on SCr of 0.66 mg/dL). Liver & Pancreas: No results for input(s): AST, ALT, ALKPHOS, BILITOT, PROT, ALBUMIN in the last 168 hours. No results for input(s): LIPASE, AMYLASE in the last 168 hours. No results for input(s): AMMONIA in the last 168 hours. Diabetic: No results for input(s): HGBA1C in the last 72 hours. Recent Labs  Lab 01/17/24 1652 01/17/24 1951 01/18/24 0017 01/18/24 0403 01/18/24 0747  GLUCAP 271* 380* 255*  191* 160*   Cardiac Enzymes: No results for input(s): CKTOTAL, CKMB, CKMBINDEX, TROPONINI in the last 168 hours. No results for input(s): PROBNP in the last 8760 hours. Coagulation Profile: No results for input(s): INR, PROTIME in the last 168 hours. Thyroid  Function Tests: No results for input(s): TSH, T4TOTAL, FREET4, T3FREE, THYROIDAB in the last 72 hours. Lipid Profile: No results for input(s): CHOL, HDL, LDLCALC, TRIG, CHOLHDL, LDLDIRECT in the last 72 hours. Anemia Panel: No results for input(s): VITAMINB12, FOLATE, FERRITIN, TIBC, IRON, RETICCTPCT in the last 72 hours. Urine analysis:    Component Value Date/Time   COLORURINE YELLOW 08/09/2019 1250   APPEARANCEUR CLOUDY (A) 08/09/2019 1250   APPEARANCEUR Clear 05/07/2019 0955   LABSPEC 1.010 08/09/2019 1250   PHURINE 6.5 08/09/2019 1250   GLUCOSEU NEGATIVE 08/09/2019 1250   HGBUR MODERATE (A) 08/09/2019 1250   BILIRUBINUR NEGATIVE 08/09/2019 1250   BILIRUBINUR Negative 05/07/2019 0955   KETONESUR NEGATIVE 08/09/2019 1250   PROTEINUR 30 (A) 08/09/2019 1250   NITRITE POSITIVE (A) 08/09/2019 1250   LEUKOCYTESUR LARGE (A) 08/09/2019 1250   Sepsis Labs: Invalid input(s):  PROCALCITONIN, LACTICIDVEN   SIGNED:  Joh Rao T Maygan Koeller, MD  [facility] 01/18/2024, 5:51 PM

## 2024-01-19 ENCOUNTER — Other Ambulatory Visit: Payer: Self-pay

## 2024-01-19 ENCOUNTER — Telehealth: Payer: Self-pay

## 2024-01-19 ENCOUNTER — Ambulatory Visit: Admitting: Pulmonary Disease

## 2024-01-19 ENCOUNTER — Encounter: Payer: Self-pay | Admitting: Pulmonary Disease

## 2024-01-19 VITALS — BP 108/60 | HR 90 | Temp 97.7°F | Ht 62.0 in | Wt 124.6 lb

## 2024-01-19 DIAGNOSIS — R0602 Shortness of breath: Secondary | ICD-10-CM | POA: Diagnosis not present

## 2024-01-19 DIAGNOSIS — J129 Viral pneumonia, unspecified: Secondary | ICD-10-CM | POA: Diagnosis not present

## 2024-01-19 DIAGNOSIS — B348 Other viral infections of unspecified site: Secondary | ICD-10-CM

## 2024-01-19 DIAGNOSIS — R052 Subacute cough: Secondary | ICD-10-CM

## 2024-01-19 MED ORDER — LEVALBUTEROL HCL 0.63 MG/3ML IN NEBU
0.6300 mg | INHALATION_SOLUTION | Freq: Four times a day (QID) | RESPIRATORY_TRACT | 2 refills | Status: AC | PRN
  Filled 2024-01-19: qty 225, 18d supply, fill #0
  Filled 2024-01-19: qty 75, 7d supply, fill #0
  Filled 2024-01-19: qty 225, 18d supply, fill #0
  Filled 2024-01-19: qty 300, 25d supply, fill #1
  Filled 2024-01-19: qty 75, 7d supply, fill #0

## 2024-01-19 MED ORDER — BUDESONIDE 0.5 MG/2ML IN SUSP
0.5000 mg | Freq: Two times a day (BID) | RESPIRATORY_TRACT | 2 refills | Status: AC
  Filled 2024-01-19: qty 120, 30d supply, fill #0

## 2024-01-19 MED ORDER — LEVALBUTEROL HCL 0.63 MG/3ML IN NEBU
0.6300 mg | INHALATION_SOLUTION | Freq: Once | RESPIRATORY_TRACT | Status: AC
  Administered 2024-01-19: 0.63 mg via RESPIRATORY_TRACT

## 2024-01-19 MED ORDER — COMPRESSOR/NEBULIZER MISC
0 refills | Status: AC
  Filled 2024-01-19: qty 1, 1d supply, fill #0

## 2024-01-19 NOTE — Progress Notes (Signed)
 "  Subjective:    Patient ID: Lisa [name], female    DOB: May 26, 1950, 73 y.o.   MRN: [id number]  Patient Care Team: Bair, Kalpana, MD as PCP - General (Family Medicine) End, Lonni, MD as PCP - Cardiology (Cardiology) Beryl Donnice BRAVO, MD as Referring Physician (Endocrinology) Pa, [facility])  Chief Complaint  Patient presents with   Hospitalization Follow-up    Cough with clear phlegm. Shortness of breath and wheezing. Using Xopenex  every 4 hours. Taking Mucinex .     BACKGROUND/INTERVAL: This is a 73 year old former smoker who presents as a posthospitalization follow-up.  She was discharged yesterday from [facility] after a 3-day admission for increasing shortness of breath and pneumonia with positive for rhinovirus testing.  She was admitted from 15 January 2019 25 through 18 January 2024.  Of note patient had PFTs on 12 December 2023 which were entirely normal.   HPI Discussed the use of AI scribe software for clinical note transcription with the patient, who gave verbal consent to proceed.  History of Present Illness   Lisa [name] is a 72 year old female with a recent rhinovirus infection and pneumonia who presents for a post-hospital follow-up.  She was recently hospitalized due to a rhinovirus infection that progressed to pneumonia. Her symptoms began several weeks ago, leading her to seek care at a walk-in clinic where she was prescribed antibiotics. Despite this, her condition did not improve, resulting in multiple emergency room visits where she received additional medications, including an inhaler (Xopenex ) and prednisone .  During her hospital stay, she was treated for pneumonia with Levaquin .  Respiratory panel eventually returned positive results to rhinovirus.  Currently, she is not taking any medications except for using her Xopenex  inhaler as needed, which she used this morning with some relief.  She has a history of type 1 diabetes, which  complicates her treatment as prednisone  affects her blood sugar levels. Her blood sugars were normal this morning. She does not have a nebulizer at home, but her husband mentioned there might be one in the garage from his late wife.  No jitteriness after using Xopenex  and no other new symptoms.  Xopenex  is utilized due to issues with A-fib with albuterol .  No fevers chills or sweats since her admission to the hospital      Review of Systems A 10 point review of systems was performed and it is as noted above otherwise negative.   Patient Active Problem List   Diagnosis Date Noted   Pneumonia 01/15/2024   Cough in adult 10/19/2023   Flu vaccine need 10/19/2023   Allergic rhinitis 10/19/2023   Essential hypertension 10/26/2022   Osteoporosis 10/23/2022   Mood disorder 10/23/2022   Encounter for screening mammogram for malignant neoplasm of breast 10/23/2022   History of colonic polyps 03/24/2022   Polyp of ascending colon 03/24/2022   Aortic atherosclerosis 04/14/2021   AAA (abdominal aortic aneurysm) without rupture 04/23/2020   Rectal polyp 07/06/2018   Type 1 diabetes mellitus with complications (HCC) 09/21/2017   Paroxysmal atrial fibrillation (HCC)    Peripheral vascular disease 11/25/2016   Hyperlipidemia due to type 1 diabetes mellitus (HCC) 11/25/2016   Coronary artery disease    Renal stone 10/12/2016   History of anaphylaxis 10/12/2016    Social History   Tobacco Use   Smoking status: Former    Current packs/day: 0.00    Average packs/day: 0.3 packs/day for 47.7 years (11.9 ttl pk-yrs)    Types: Cigarettes  Start date: 01/25/1969    Quit date: 10/11/2016    Years since quitting: 7.2   Smokeless tobacco: Never  Substance Use Topics   Alcohol use: No    Comment: 1 glass of wine per night    Allergies[1]  Active Medications[2]  Immunization History  Administered Date(s) Administered   Fluad Trivalent(High Dose 65+) 10/07/2022   INFLUENZA, HIGH DOSE  SEASONAL PF 10/19/2023   PFIZER(Purple Top)SARS-COV-2 Vaccination 03/17/2019, 04/10/2019   Pneumococcal Polysaccharide-23 04/21/2008, 08/15/2013   Td 10/09/2009   Zoster, Live 11/29/2010        Objective:     Vitals:   01/19/24 1024  BP: 108/60  Pulse: 90  Temp: 97.7 F (36.5 C)  Height: 5' 2 (1.575 m)  Weight: 124 lb 9.6 oz (56.5 kg)  SpO2: 93%  TempSrc: Temporal  BMI (Calculated): 22.78     SpO2: 93 %  GENERAL: Well-developed, well-nourished woman, well groomed.  Mild conversational dyspnea.  Mild tachypnea noted.  Fully ambulatory. HEAD: Normocephalic, atraumatic.  EYES: Pupils equal, round, reactive to light.  No scleral icterus.  MOUTH: Dentition intact, oral mucosa moist.  No thrush. NECK: Supple. No thyromegaly. Trachea midline. No JVD.  No adenopathy. PULMONARY: Good air entry bilaterally.  Occasional pops and squeaks noted, wheezing noted. CARDIOVASCULAR: S1 and S2. Regular rate and rhythm.  No rubs, murmurs or gallops heard.   ABDOMEN: Benign. MUSCULOSKELETAL: No joint deformity, no clubbing, no edema.  NEUROLOGIC: No overt focal deficit, no gait disturbance, speech is fluent.   SKIN: Intact,warm,dry. PSYCH: Mood and behavior normal.  Representative images from CT angio chest performed 15 January 2024 showing scattered ground glass opacities primarily on the left lung consistent with atypical/viral pneumonia:       Patient received nebulization treatment with Xopenex  0.63 mg which improved air movement and decreased bronchospasm.  Patient noted significant relief of dyspnea after treatment.  Assessment & Plan:     ICD-10-CM   1. Viral pneumonia  J12.9     2. Rhinovirus infection  B34.8     3. Subacute cough  R05.2 levalbuterol  (XOPENEX ) nebulizer solution 0.63 mg    4. Shortness of breath  R06.02      Meds ordered this encounter  Medications   levalbuterol  (XOPENEX ) nebulizer solution 0.63 mg   Nebulizers (COMPRESSOR/NEBULIZER) MISC    Sig:  Use as directed for nebulization treatments    Dispense:  1 each    Refill:  0   levalbuterol  (XOPENEX ) 0.63 MG/3ML nebulizer solution    Sig: Take 3 mLs (0.63 mg total) by nebulization every 6 (six) hours as needed for wheezing or shortness of breath.    Dispense:  300 mL    Refill:  2    300 is pack size, not 360   budesonide  (PULMICORT ) 0.5 MG/2ML nebulizer solution    Sig: Take 2 mLs (0.5 mg total) by nebulization 2 (two) times daily.    Dispense:  120 mL    Refill:  2   Discussion:    Viral pneumonia due to rhinovirus Secondary to rhinovirus infection, presenting with respiratory symptoms and pneumonia on imaging. Recent hospitalization for management. Current symptoms include respiratory distress, improved with inhaler use. No home oxygen therapy required. Concerns about systemic prednisone  due to type 1 diabetes. - Prescribed nebulizer for home use, will use budesonide  to minimize systemic effects on blood glucose levels. - Prescribed nebulizer and medications from the pharmacy [facility]). - Instructed on sequential use of nebulized medications: first use Xopenex  0.63 mg, followed  by budesonide  (Pulmicort ) 0.5 mg, twice a day. - Advised use of Xopenex  for additional relief if needed. - Scheduled follow-up appointment in 1-2 weeks.   Advised if symptoms do not improve or worsen, to please contact office for sooner follow up or seek emergency care.    I spent 40 minutes of dedicated to the care of this patient on the date of this encounter to include pre-visit review of records, face-to-face time with the patient discussing conditions above, post visit ordering of testing, clinical documentation with the electronic health record, making appropriate referrals as documented, and communicating necessary findings to members of the patients care team.     C. Leita Sanders, MD Advanced Bronchoscopy PCCM [facility]    *This note was generated using voice recognition  software/Dragon and/or AI transcription program.  Despite best efforts to proofread, errors can occur which can change the meaning. Any transcriptional errors that result from this process are unintentional and may not be fully corrected at the time of dictation.     [1]  Allergies Allergen Reactions   Ceftriaxone  Other (See Comments)    Anaphylaxis, cardiac arrest   Penicillins Anaphylaxis   Rocephin  [Ceftriaxone  Sodium In Dextrose ] Anaphylaxis   Albuterol  Other (See Comments)    Tachycardia-->Afib. Tolerates Xopenex   [2]  Current Meds  Medication Sig   ascorbic acid (VITAMIN C) 500 MG tablet Take 1,000 mg by mouth daily.   aspirin  EC 81 MG tablet Take 81 mg by mouth daily.   atorvastatin  (LIPITOR ) 40 MG tablet Take 1 tablet (40 mg total) by mouth at bedtime.   budesonide  (PULMICORT ) 0.5 MG/2ML nebulizer solution Take 2 mLs (0.5 mg total) by nebulization 2 (two) times daily.   Continuous Glucose Transmitter (DEXCOM G6 TRANSMITTER) MISC by Does not apply route.   diltiazem  (CARDIZEM  CD) 240 MG 24 hr capsule TAKE 1 CAPSULE(240 MG) BY MOUTH DAILY   Insulin  Disposable Pump (OMNIPOD 5 G6 PODS, GEN 5,) MISC Inject into the skin.   levalbuterol  (XOPENEX  HFA) 45 MCG/ACT inhaler Inhale 2 puffs into the lungs every 4 (four) hours as needed for wheezing or shortness of breath.   levalbuterol  (XOPENEX ) 0.63 MG/3ML nebulizer solution Take 3 mLs (0.63 mg total) by nebulization every 6 (six) hours as needed for wheezing or shortness of breath.   losartan  (COZAAR ) 25 MG tablet Take 1 tablet (25 mg total) by mouth daily.   Multiple Vitamins-Minerals (PRESERVISION AREDS PO) Take 1 tablet by mouth daily.   Nebulizers (COMPRESSOR/NEBULIZER) MISC Use as directed for nebulization treatments   nitroGLYCERIN  (NITROSTAT ) 0.4 MG SL tablet Place 1 tablet (0.4 mg total) under the tongue every 5 (five) minutes x 3 doses as needed for chest pain.   NOVOLOG  100 UNIT/ML injection Inject 0-35 Units into the skin daily.  Uses with Insulin  Pump   ONETOUCH VERIO test strip USE TO TEST FOUR TIMES DAILY   "

## 2024-01-19 NOTE — Patient Instructions (Signed)
 VISIT SUMMARY:  You were seen today for a follow-up after your recent hospitalization due to a rhinovirus infection that progressed to pneumonia. You have a history of type 1 diabetes, which complicates your treatment, especially with medications like prednisone  that can affect your blood sugar levels. Currently, you are using an inhaler as needed and have no new symptoms.  YOUR PLAN:  -VIRAL PNEUMONIA: Viral pneumonia is a lung infection caused by a virus, in this case, the rhinovirus. It leads to symptoms like cough, fever, and difficulty breathing. You were recently hospitalized for this condition and are now recovering. To help manage your symptoms, you were given nebulized prednisone  to avoid affecting your blood sugar levels. You have been prescribed a nebulizer and medications from the pharmacy. You should use the Xopenex  (levo albuterol ) first followed by budesonide  (Pulmicort ) in the nebulizer. If you need additional relief, you can use Xopenex (levo albuterol ) an additional 1-2 times per day for no more than 4 times per day.  INSTRUCTIONS:  Please schedule a follow-up appointment in 1-2 weeks to monitor your recovery. Make sure to use your nebulizer as instructed and keep an eye on your blood sugar levels, especially when using prednisone .

## 2024-01-19 NOTE — Transitions of Care (Post Inpatient/ED Visit) (Signed)
" ° °  01/19/2024  Name: Lisa [name] MRN: [id number] DOB: 1951/01/15  Today's TOC FU Call Status: Today's TOC FU Call Status:: Unsuccessful Call (1st Attempt) Unsuccessful Call (1st Attempt) Date: 01/19/24  Attempted to reach the patient regarding the most recent Inpatient/ED visit.  Follow Up Plan: Additional outreach attempts will be made to reach the patient to complete the Transitions of Care (Post Inpatient/ED visit) call.   Arvin Seip RN, BSN, CCM [employer], Population Health Case Manager Phone: [phone]  "

## 2024-01-22 ENCOUNTER — Telehealth: Payer: Self-pay

## 2024-01-22 NOTE — Transitions of Care (Post Inpatient/ED Visit) (Signed)
" ° °  01/22/2024  Name: Lisa [name] MRN: [id number] DOB: [birth year]  Today's TOC FU Call Status: Today's TOC FU Call Status:: Unsuccessful Call (2nd Attempt) Unsuccessful Call (2nd Attempt) Date: 01/22/24  Attempted to reach the patient regarding the most recent Inpatient/ED visit.  Follow Up Plan: Additional outreach attempts will be made to reach the patient to complete the Transitions of Care (Post Inpatient/ED visit) call.   Alan Ee, RN, BSN, CEN Population Health- Transition of Care Team.  [facility] [phone]  "

## 2024-01-23 ENCOUNTER — Telehealth: Payer: Self-pay

## 2024-01-23 ENCOUNTER — Ambulatory Visit

## 2024-01-23 NOTE — Transitions of Care (Post Inpatient/ED Visit) (Signed)
 "  01/23/2024  Name: Lisa [name] MRN: [id number] DOB: 06-02-1950  Today's TOC FU Call Status: Today's TOC FU Call Status:: Successful TOC FU Call Completed TOC FU Call Complete Date: 01/23/24  Patient's Name and Date of Birth confirmed. Name, DOB  Transition Care Management Follow-up Telephone Call Date of Discharge: 01/18/24 Discharge Facility: [facility]) Type of Discharge: Inpatient Admission Primary Inpatient Discharge Diagnosis:: Sepsis d/t community acquired pneumonia and rhinovirus infection  Acute hypoxic respiratory failure: How have you been since you were released from the hospital?: Better Any questions or concerns?: No  Items Reviewed: Did you receive and understand the discharge instructions provided?: Yes Medications obtained,verified, and reconciled?: Yes (Medications Reviewed) Any new allergies since your discharge?: No Dietary orders reviewed?: Yes Type of Diet Ordered:: diabetic diet Do you have support at home?: Yes People in Home [RPT]: spouse Name of Support/Comfort Primary Source: Lynwood Clause  Medications Reviewed Today: Medications Reviewed Today     Reviewed by Ryszard Socarras E, RN (Registered Nurse) on 01/23/24 at 1131  Med List Status: <None>   Medication Order Taking? Sig Documenting Provider Last Dose Status Informant  ascorbic acid (VITAMIN C) 500 MG tablet [id number] Yes Take 1,000 mg by mouth daily. [provider]  Active Self  aspirin  EC 81 MG tablet [id number] Yes Take 81 mg by mouth daily. [provider]  Active Self           Med Note (NEWCOMER MCCLAIN, BRANDY L   Wed Apr 04, 2018  1:48 PM)    atorvastatin  (LIPITOR ) 40 MG tablet [id number] Yes Take 1 tablet (40 mg total) by mouth at bedtime. Bair, Kalpana, MD  Active Self  budesonide  (PULMICORT ) 0.5 MG/2ML nebulizer solution [id number] Yes Take 2 mLs (0.5 mg total) by nebulization 2 (two) times daily. Tamea Dedra CROME, MD  Active   Continuous  Glucose Transmitter (DEXCOM G6 TRANSMITTER) MISC [id number] Yes by Does not apply route. [provider]  Active Self  diltiazem  (CARDIZEM  CD) 240 MG 24 hr capsule [id number] Yes TAKE 1 CAPSULE(240 MG) BY MOUTH DAILY End, Christopher, MD  Active Self  Insulin  Disposable Pump (OMNIPOD 5 G6 PODS, GEN 5,) MISC [id number] Yes Inject into the skin. [provider]  Active Self  levalbuterol  (XOPENEX  HFA) 45 MCG/ACT inhaler [id number] Yes Inhale 2 puffs into the lungs every 4 (four) hours as needed for wheezing or shortness of breath. Levander Slate, MD  Active Self  levalbuterol  (XOPENEX ) 0.63 MG/3ML nebulizer solution [id number] Yes Take 3 mLs (0.63 mg total) by nebulization every 6 (six) hours as needed for wheezing or shortness of breath. Tamea Dedra CROME, MD  Active   losartan  (COZAAR ) 25 MG tablet [id number] Yes Take 1 tablet (25 mg total) by mouth daily. Abbey Bruckner, MD  Active Self  Multiple Vitamins-Minerals (PRESERVISION AREDS PO) [id number] Yes Take 1 tablet by mouth daily. [provider]  Active Self  Nebulizers (COMPRESSOR/NEBULIZER) MISC [id number] Yes Use as directed for nebulization treatments Tamea Dedra CROME, MD  Active   nitroGLYCERIN  (NITROSTAT ) 0.4 MG SL tablet [id number] Yes Place 1 tablet (0.4 mg total) under the tongue every 5 (five) minutes x 3 doses as needed for chest pain. End, Lonni, MD  Active Self  NOVOLOG  100 UNIT/ML injection [id number] Yes Inject 0-35 Units into the skin daily. Uses with Insulin  Pump [provider]  Active Self  ONETOUCH VERIO test strip [id number] Yes USE TO TEST FOUR TIMES DAILY [provider]  Active Self  Home Care and Equipment/Supplies: Were Home Health Services Ordered?: No Any new equipment or medical supplies ordered?: No  Functional Questionnaire: Do you need assistance with bathing/showering or dressing?: No Do you need assistance with meal preparation?: No Do you need assistance  with eating?: No Do you have difficulty maintaining continence: No Do you need assistance with getting out of bed/getting out of a chair/moving?: No Do you have difficulty managing or taking your medications?: No  Follow up appointments reviewed: PCP Follow-up appointment confirmed?: No (patient states she cancelled her primary care provider appointment due to having a hospital follow up appointment with the pulmonologist on 01/19/24.  She states she didn't feel she needed it.) Specialist Hospital Follow-up appointment confirmed?: Yes Date of Specialist follow-up appointment?: 01/19/24 Follow-Up Specialty Provider:: Dr. Lenda Do you need transportation to your follow-up appointment?: No Do you understand care options if your condition(s) worsen?: Yes-patient verbalized understanding  SDOH Interventions Today    Flowsheet Row Most Recent Value  SDOH Interventions   Food Insecurity Interventions Intervention Not Indicated  Housing Interventions Intervention Not Indicated  Transportation Interventions Intervention Not Indicated  Utilities Interventions Intervention Not Indicated   Discussed and offered 30 day TOC program.  Patient  declined.  The patient has been provided with contact information for the care management team and has been advised to call with any health -related questions or concerns.  The patient verbalized understanding with current plan of care.  The patient is directed to their insurance card regarding availability of benefits coverage.    Arvin Seip RN, BSN, CCM [employer], Population Health Case Manager Phone: [phone]  "

## 2024-01-23 NOTE — Transitions of Care (Post Inpatient/ED Visit) (Signed)
" ° °  01/23/2024  Name: Lisa [name] MRN: [id number] DOB: Mar 25, 1950  Today's TOC FU Call Status: Today's TOC FU Call Status:: Unsuccessful Call (3rd Attempt) Unsuccessful Call (3rd Attempt) Date: 01/23/24  Attempted to reach the patient regarding the most recent Inpatient/ED visit.  Follow Up Plan: Additional outreach attempts will be made to reach the patient to complete the Transitions of Care (Post Inpatient/ED visit) call.   Arvin Seip RN, BSN, CCM [employer], Population Health Case Manager Phone: [phone]  "

## 2024-01-23 NOTE — Patient Instructions (Signed)
 Visit Information  Thank you for taking time to visit with me today. Please don't hesitate to contact me if I can be of assistance to you.  Patient instructions:  Do deep breathing and coughing every 2 hours or so  Stay hydrated Take your medications as prescribed Notify your provider for any new/ ongoing symptoms. Seek emergency medical services for severe symptoms    Patient verbalizes understanding of instructions and care plan provided today and agrees to view in MyChart. Active MyChart status and patient understanding of how to access instructions and care plan via MyChart confirmed with patient.     The patient has been provided with contact information for the care management team and has been advised to call with any health related questions or concerns.   Please call the care guide team at [phone] if you need to cancel or reschedule your appointment.   Please call the Suicide and Crisis Lifeline: 988 call the [place]  National Suicide Prevention Lifeline: 613-071-6794 or TTY: (503)429-4364 TTY 858 373 9924) to talk to a trained counselor call 1-800-273-TALK (toll free, 24 hour hotline) if you are experiencing a Mental Health or Behavioral Health Crisis or need someone to talk to.  Arvin Seip RN, BSN, CCM [employer], Population Health Case Manager Phone: [phone]

## 2024-01-30 ENCOUNTER — Ambulatory Visit: Admitting: Pulmonary Disease

## 2024-01-30 ENCOUNTER — Encounter: Payer: Self-pay | Admitting: Pulmonary Disease

## 2024-01-30 VITALS — BP 116/62 | HR 73 | Temp 98.1°F | Ht 62.0 in | Wt 123.8 lb

## 2024-01-30 DIAGNOSIS — R0602 Shortness of breath: Secondary | ICD-10-CM

## 2024-01-30 DIAGNOSIS — B348 Other viral infections of unspecified site: Secondary | ICD-10-CM | POA: Diagnosis not present

## 2024-01-30 DIAGNOSIS — R052 Subacute cough: Secondary | ICD-10-CM

## 2024-01-30 DIAGNOSIS — Z87891 Personal history of nicotine dependence: Secondary | ICD-10-CM | POA: Diagnosis not present

## 2024-01-30 DIAGNOSIS — J129 Viral pneumonia, unspecified: Secondary | ICD-10-CM

## 2024-01-30 NOTE — Progress Notes (Signed)
 "  Subjective:    Patient ID: Lisa [name], female    DOB: Jun 03, 1950, 73 y.o.   MRN: [id number]  Patient Care Team: Bair, Kalpana, MD as PCP - General (Family Medicine) End, Lonni, MD as PCP - Cardiology (Cardiology) Beryl Donnice BRAVO, MD as Referring Physician (Endocrinology) Pa, [facility])  Chief Complaint  Patient presents with   Medical Management of Chronic Issues    Occasional cough. No shortness of breath or wheezing.     BACKGROUND/INTERVAL:This is a 73 year old former smoker who presents after posthospitalization follow-up.  She was discharged 18 December from [facility] after a 3-day admission for increasing shortness of breath and pneumonia with positive for rhinovirus testing.  She was admitted from 15 January 2019 25 through 18 January 2024.  Of note patient had PFTs on 12 December 2023 which were entirely normal.  She was last seen on 19 December the day after her discharge this is a follow-up from that visit.  HPI Discussed the use of AI scribe software for clinical note transcription with the patient, who gave verbal consent to proceed.  History of Present Illness   TOYOKO [name] is a 73 year old female who presents for follow-up of rhinovirus pneumonia.  She feels much better since her last visit and continues to use the nebulizer. She inquires about the duration of its use. She is still experiencing some cough and raspy voice, which she attributes to the medication (budesonide ). No shortness of breath since she started the nebulizers.  She was advised to rinse her mouth well after use of the nebulizer.  She has not had any fevers, chills or sweats since her last visit.  As noted her shortness of breath is markedly improved.  Cough is likewise markedly improved.  She mentions that she has not been going out much, especially during the holidays, and her family visited her instead.     Overall she feels markedly improved and she looks well.  She does  not voice any other active complaint today.  Review of Systems A 10 point review of systems was performed and it is as noted above otherwise negative.   Patient Active Problem List   Diagnosis Date Noted   Pneumonia 01/15/2024   Cough in adult 10/19/2023   Flu vaccine need 10/19/2023   Allergic rhinitis 10/19/2023   Essential hypertension 10/26/2022   Osteoporosis 10/23/2022   Mood disorder 10/23/2022   Encounter for screening mammogram for malignant neoplasm of breast 10/23/2022   History of colonic polyps 03/24/2022   Polyp of ascending colon 03/24/2022   Aortic atherosclerosis 04/14/2021   AAA (abdominal aortic aneurysm) without rupture 04/23/2020   Rectal polyp 07/06/2018   Type 1 diabetes mellitus with complications (HCC) 09/21/2017   Paroxysmal atrial fibrillation (HCC)    Peripheral vascular disease 11/25/2016   Hyperlipidemia due to type 1 diabetes mellitus (HCC) 11/25/2016   Coronary artery disease    Renal stone 10/12/2016   History of anaphylaxis 10/12/2016    Social History   Tobacco Use   Smoking status: Former    Current packs/day: 0.00    Average packs/day: 0.3 packs/day for 47.7 years (11.9 ttl pk-yrs)    Types: Cigarettes    Start date: 01/25/1969    Quit date: 10/11/2016    Years since quitting: 7.3   Smokeless tobacco: Never  Substance Use Topics   Alcohol use: No    Comment: 1 glass of wine per night    Allergies[1]  Active Medications[2]  Immunization History  Administered Date(s) Administered   Fluad Trivalent(High Dose 65+) 10/07/2022   INFLUENZA, HIGH DOSE SEASONAL PF 10/19/2023   PFIZER(Purple Top)SARS-COV-2 Vaccination 03/17/2019, 04/10/2019   Pneumococcal Polysaccharide-23 04/21/2008, 08/15/2013   Td 10/09/2009   Zoster, Live 11/29/2010        Objective:     Vitals:   01/30/24 1016  BP: 116/62  Pulse: 73  Temp: 98.1 F (36.7 C)  Height: 5' 2 (1.575 m)  Weight: 123 lb 12.8 oz (56.2 kg)  SpO2: 99%  TempSrc: Temporal   BMI (Calculated): 22.64     SpO2: 99 %  GENERAL: Well-developed, well-nourished woman, well groomed.  No conversational dyspnea.  No tachypnea noted.  Fully ambulatory. HEAD: Normocephalic, atraumatic.  EYES: Pupils equal, round, reactive to light.  No scleral icterus.  MOUTH: Dentition intact, oral mucosa moist.  No thrush. NECK: Supple. No thyromegaly. Trachea midline. No JVD.  No adenopathy. PULMONARY: Good air entry bilaterally.  Occasional squeaks noted on right upper lung field, no wheezing or rhonchi noted. CARDIOVASCULAR: S1 and S2. Regular rate and rhythm.  No rubs, murmurs or gallops heard.   ABDOMEN: Benign. MUSCULOSKELETAL: No joint deformity, no clubbing, no edema.  NEUROLOGIC: No overt focal deficit, no gait disturbance, speech is fluent.   SKIN: Intact,warm,dry. PSYCH: Mood and behavior normal.       Assessment & Plan:     ICD-10-CM   1. Viral pneumonia  J12.9    Clinically resolving    2. Rhinovirus infection  B34.8    Clinically resolving    3. Subacute cough  R05.2    Resolved    4. Shortness of breath  R06.02    Improved     Discussion:    Recovery from viral pneumonia Recovering well from rhinovirus pneumonia with significant improvement in respiratory status.  Rare mild squeaks, but overall lung sounds are clear. No shortness of breath reported. Cough and tightness may linger for up to eight weeks post-infection. - Continue Pulmicort  (budesonide ) nebulizer for another two weeks, then taper to as needed. - Use Xopenex  nebulizer as needed for tightness or cough. - Rinse mouth and gargle with water mixed with baking soda after using budesonide  to prevent voice changes. - Will follow up in four to six weeks to assess progress.      Advised if symptoms do not improve or worsen, to please contact office for sooner follow up or seek emergency care.    I spent 30 minutes of dedicated to the care of this patient on the date of this encounter to include  pre-visit review of records, face-to-face time with the patient discussing conditions above, post visit ordering of testing, clinical documentation with the electronic health record, making appropriate referrals as documented, and communicating necessary findings to members of the patients care team.     C. Leita Sanders, MD Advanced Bronchoscopy [facility]    *This note was generated using voice recognition software/Dragon and/or AI transcription program.  Despite best efforts to proofread, errors can occur which can change the meaning. Any transcriptional errors that result from this process are unintentional and may not be fully corrected at the time of dictation.     [1]  Allergies Allergen Reactions   Ceftriaxone  Other (See Comments)    Anaphylaxis, cardiac arrest   Penicillins Anaphylaxis   Rocephin  [Ceftriaxone  Sodium In Dextrose ] Anaphylaxis   Albuterol  Other (See Comments)    Tachycardia-->Afib. Tolerates Xopenex   [2]  Current Meds  Medication Sig   ascorbic  acid (VITAMIN C) 500 MG tablet Take 1,000 mg by mouth daily.   aspirin  EC 81 MG tablet Take 81 mg by mouth daily.   atorvastatin  (LIPITOR ) 40 MG tablet Take 1 tablet (40 mg total) by mouth at bedtime.   budesonide  (PULMICORT ) 0.5 MG/2ML nebulizer solution Take 2 mLs (0.5 mg total) by nebulization 2 (two) times daily.   Continuous Glucose Transmitter (DEXCOM G6 TRANSMITTER) MISC by Does not apply route.   diltiazem  (CARDIZEM  CD) 240 MG 24 hr capsule TAKE 1 CAPSULE(240 MG) BY MOUTH DAILY   Insulin  Disposable Pump (OMNIPOD 5 G6 PODS, GEN 5,) MISC Inject into the skin.   levalbuterol  (XOPENEX  HFA) 45 MCG/ACT inhaler Inhale 2 puffs into the lungs every 4 (four) hours as needed for wheezing or shortness of breath.   levalbuterol  (XOPENEX ) 0.63 MG/3ML nebulizer solution Take 3 mLs (0.63 mg total) by nebulization every 6 (six) hours as needed for wheezing or shortness of breath.   losartan  (COZAAR ) 25  MG tablet Take 1 tablet (25 mg total) by mouth daily.   Multiple Vitamins-Minerals (PRESERVISION AREDS PO) Take 1 tablet by mouth daily.   Nebulizers (COMPRESSOR/NEBULIZER) MISC Use as directed for nebulization treatments   nitroGLYCERIN  (NITROSTAT ) 0.4 MG SL tablet Place 1 tablet (0.4 mg total) under the tongue every 5 (five) minutes x 3 doses as needed for chest pain.   NOVOLOG  100 UNIT/ML injection Inject 0-35 Units into the skin daily. Uses with Insulin  Pump   ONETOUCH VERIO test strip USE TO TEST FOUR TIMES DAILY   "

## 2024-01-30 NOTE — Patient Instructions (Signed)
 VISIT SUMMARY:  During your visit today, we discussed your recovery from rhinovirus pneumonia. You mentioned feeling much better and continuing to use the nebulizer, although you still have some cough and a raspy voice. You also noted that you have been staying indoors more often, especially during the holidays.  YOUR PLAN:  -RECOVERY FROM VIRAL PNEUMONIA: Rhinovirus pneumonia is a lung infection caused by the rhinovirus, which can lead to symptoms like cough, wheezing, and difficulty breathing. You are recovering well with significant improvement in your respiratory status. You should continue using the Pulmicort  (budesonide ) nebulizer for another two weeks and then taper to as needed. Use the Xopenex  nebulizer as needed for tightness or cough. To prevent voice changes, rinse your mouth and gargle with water mixed with baking soda after using Xopenex . We will follow up in four to six weeks to assess your progress.  INSTRUCTIONS:  Please continue using the Pulmicort  nebulizer for two more weeks and then taper to as needed. Use the Xopenex  nebulizer as needed for tightness or cough, and remember to rinse your mouth and gargle with water mixed with baking soda after using Xopenex . We will schedule a follow-up appointment in four to six weeks to check on your progress.

## 2024-03-12 ENCOUNTER — Ambulatory Visit: Admitting: Pulmonary Disease

## 2024-04-17 ENCOUNTER — Other Ambulatory Visit

## 2024-04-19 ENCOUNTER — Ambulatory Visit

## 2024-11-20 ENCOUNTER — Ambulatory Visit
